# Patient Record
Sex: Female | Born: 1935
Health system: Southern US, Community
[De-identification: ages and names within clinical notes are randomized; demographics above are authoritative.]

## PROBLEM LIST (undated history)

## (undated) DIAGNOSIS — M1990 Unspecified osteoarthritis, unspecified site: Secondary | ICD-10-CM

## (undated) DIAGNOSIS — Z8049 Family history of malignant neoplasm of other genital organs: Secondary | ICD-10-CM

## (undated) DIAGNOSIS — M8580 Other specified disorders of bone density and structure, unspecified site: Secondary | ICD-10-CM

## (undated) DIAGNOSIS — Z801 Family history of malignant neoplasm of trachea, bronchus and lung: Secondary | ICD-10-CM

## (undated) DIAGNOSIS — E785 Hyperlipidemia, unspecified: Secondary | ICD-10-CM

## (undated) DIAGNOSIS — C8590 Non-Hodgkin lymphoma, unspecified, unspecified site: Secondary | ICD-10-CM

## (undated) DIAGNOSIS — C55 Malignant neoplasm of uterus, part unspecified: Secondary | ICD-10-CM

## (undated) DIAGNOSIS — G4700 Insomnia, unspecified: Secondary | ICD-10-CM

## (undated) DIAGNOSIS — Z9289 Personal history of other medical treatment: Secondary | ICD-10-CM

## (undated) DIAGNOSIS — Z8051 Family history of malignant neoplasm of kidney: Secondary | ICD-10-CM

## (undated) DIAGNOSIS — M719 Bursopathy, unspecified: Secondary | ICD-10-CM

## (undated) DIAGNOSIS — R339 Retention of urine, unspecified: Secondary | ICD-10-CM

## (undated) DIAGNOSIS — K219 Gastro-esophageal reflux disease without esophagitis: Secondary | ICD-10-CM

## (undated) DIAGNOSIS — Z8542 Personal history of malignant neoplasm of other parts of uterus: Secondary | ICD-10-CM

## (undated) DIAGNOSIS — I10 Essential (primary) hypertension: Secondary | ICD-10-CM

## (undated) DIAGNOSIS — F419 Anxiety disorder, unspecified: Secondary | ICD-10-CM

## (undated) DIAGNOSIS — T7840XA Allergy, unspecified, initial encounter: Secondary | ICD-10-CM

## (undated) HISTORY — DX: Non-Hodgkin lymphoma, unspecified, unspecified site: C85.90

## (undated) HISTORY — DX: Family history of malignant neoplasm of trachea, bronchus and lung: Z80.1

## (undated) HISTORY — PX: ABDOMINAL HYSTERECTOMY: SHX81

## (undated) HISTORY — DX: Other specified disorders of bone density and structure, unspecified site: M85.80

## (undated) HISTORY — PX: TONSILLECTOMY AND ADENOIDECTOMY: SUR1326

## (undated) HISTORY — DX: Gastro-esophageal reflux disease without esophagitis: K21.9

## (undated) HISTORY — DX: Retention of urine, unspecified: R33.9

## (undated) HISTORY — DX: Family history of malignant neoplasm of kidney: Z80.51

## (undated) HISTORY — DX: Anxiety disorder, unspecified: F41.9

## (undated) HISTORY — PX: LAPAROSCOPIC SMALL BOWEL RESECTION: SUR793

## (undated) HISTORY — DX: Unspecified osteoarthritis, unspecified site: M19.90

## (undated) HISTORY — DX: Insomnia, unspecified: G47.00

## (undated) HISTORY — PX: BLADDER SURGERY: SHX569

## (undated) HISTORY — PX: APPENDECTOMY: SHX54

## (undated) HISTORY — DX: Personal history of malignant neoplasm of other parts of uterus: Z85.42

## (undated) HISTORY — DX: Bursopathy, unspecified: M71.9

## (undated) HISTORY — DX: Allergy, unspecified, initial encounter: T78.40XA

## (undated) HISTORY — DX: Hyperlipidemia, unspecified: E78.5

## (undated) HISTORY — DX: Family history of malignant neoplasm of other genital organs: Z80.49

## (undated) HISTORY — PX: REPLACEMENT TOTAL KNEE BILATERAL: SUR1225

## (undated) HISTORY — DX: Personal history of other medical treatment: Z92.89

## (undated) HISTORY — DX: Essential (primary) hypertension: I10

## (undated) HISTORY — PX: OTHER SURGICAL HISTORY: SHX169

## (undated) HISTORY — PX: ECTOPIC PREGNANCY SURGERY: SHX613

## (undated) HISTORY — DX: Malignant neoplasm of uterus, part unspecified: C55

---

## 2008-09-07 DIAGNOSIS — H40059 Ocular hypertension, unspecified eye: Secondary | ICD-10-CM | POA: Insufficient documentation

## 2009-08-31 DIAGNOSIS — R339 Retention of urine, unspecified: Secondary | ICD-10-CM | POA: Insufficient documentation

## 2010-04-10 DIAGNOSIS — H04129 Dry eye syndrome of unspecified lacrimal gland: Secondary | ICD-10-CM | POA: Insufficient documentation

## 2011-02-12 DIAGNOSIS — Z9189 Other specified personal risk factors, not elsewhere classified: Secondary | ICD-10-CM | POA: Insufficient documentation

## 2011-02-13 DIAGNOSIS — M1710 Unilateral primary osteoarthritis, unspecified knee: Secondary | ICD-10-CM | POA: Insufficient documentation

## 2011-02-13 DIAGNOSIS — M179 Osteoarthritis of knee, unspecified: Secondary | ICD-10-CM | POA: Insufficient documentation

## 2016-03-17 DIAGNOSIS — H903 Sensorineural hearing loss, bilateral: Secondary | ICD-10-CM | POA: Diagnosis not present

## 2016-03-17 DIAGNOSIS — Z974 Presence of external hearing-aid: Secondary | ICD-10-CM | POA: Diagnosis not present

## 2016-03-17 DIAGNOSIS — H905 Unspecified sensorineural hearing loss: Secondary | ICD-10-CM | POA: Diagnosis not present

## 2016-04-01 DIAGNOSIS — N3944 Nocturnal enuresis: Secondary | ICD-10-CM | POA: Diagnosis not present

## 2016-04-02 DIAGNOSIS — H04123 Dry eye syndrome of bilateral lacrimal glands: Secondary | ICD-10-CM | POA: Diagnosis not present

## 2016-04-02 DIAGNOSIS — H40053 Ocular hypertension, bilateral: Secondary | ICD-10-CM | POA: Diagnosis not present

## 2016-04-02 DIAGNOSIS — Z961 Presence of intraocular lens: Secondary | ICD-10-CM | POA: Diagnosis not present

## 2016-04-02 DIAGNOSIS — H43393 Other vitreous opacities, bilateral: Secondary | ICD-10-CM | POA: Diagnosis not present

## 2016-04-17 DIAGNOSIS — R339 Retention of urine, unspecified: Secondary | ICD-10-CM | POA: Diagnosis not present

## 2016-04-17 DIAGNOSIS — R351 Nocturia: Secondary | ICD-10-CM | POA: Diagnosis not present

## 2016-04-21 DIAGNOSIS — K660 Peritoneal adhesions (postprocedural) (postinfection): Secondary | ICD-10-CM | POA: Diagnosis not present

## 2016-04-21 DIAGNOSIS — Z9889 Other specified postprocedural states: Secondary | ICD-10-CM | POA: Diagnosis not present

## 2016-04-21 DIAGNOSIS — Z87440 Personal history of urinary (tract) infections: Secondary | ICD-10-CM | POA: Diagnosis not present

## 2016-04-21 DIAGNOSIS — Z960 Presence of urogenital implants: Secondary | ICD-10-CM | POA: Diagnosis not present

## 2016-04-21 DIAGNOSIS — R8271 Bacteriuria: Secondary | ICD-10-CM | POA: Diagnosis not present

## 2016-04-21 DIAGNOSIS — N318 Other neuromuscular dysfunction of bladder: Secondary | ICD-10-CM | POA: Diagnosis not present

## 2016-04-21 DIAGNOSIS — Z792 Long term (current) use of antibiotics: Secondary | ICD-10-CM | POA: Diagnosis not present

## 2016-04-21 DIAGNOSIS — R339 Retention of urine, unspecified: Secondary | ICD-10-CM | POA: Diagnosis not present

## 2016-05-05 DIAGNOSIS — Z471 Aftercare following joint replacement surgery: Secondary | ICD-10-CM | POA: Diagnosis not present

## 2016-05-05 DIAGNOSIS — Z96653 Presence of artificial knee joint, bilateral: Secondary | ICD-10-CM | POA: Diagnosis not present

## 2016-05-05 DIAGNOSIS — Z96651 Presence of right artificial knee joint: Secondary | ICD-10-CM | POA: Diagnosis not present

## 2016-05-05 DIAGNOSIS — Z09 Encounter for follow-up examination after completed treatment for conditions other than malignant neoplasm: Secondary | ICD-10-CM | POA: Diagnosis not present

## 2016-05-05 DIAGNOSIS — Z96652 Presence of left artificial knee joint: Secondary | ICD-10-CM | POA: Diagnosis not present

## 2016-05-08 DIAGNOSIS — R55 Syncope and collapse: Secondary | ICD-10-CM | POA: Diagnosis not present

## 2016-05-08 DIAGNOSIS — R42 Dizziness and giddiness: Secondary | ICD-10-CM | POA: Diagnosis not present

## 2016-05-08 DIAGNOSIS — Z923 Personal history of irradiation: Secondary | ICD-10-CM | POA: Diagnosis not present

## 2016-05-08 DIAGNOSIS — D649 Anemia, unspecified: Secondary | ICD-10-CM | POA: Diagnosis not present

## 2016-05-08 DIAGNOSIS — R531 Weakness: Secondary | ICD-10-CM | POA: Diagnosis not present

## 2016-05-08 DIAGNOSIS — Z8544 Personal history of malignant neoplasm of other female genital organs: Secondary | ICD-10-CM | POA: Diagnosis not present

## 2016-05-08 DIAGNOSIS — D62 Acute posthemorrhagic anemia: Secondary | ICD-10-CM | POA: Diagnosis not present

## 2016-05-08 DIAGNOSIS — R58 Hemorrhage, not elsewhere classified: Secondary | ICD-10-CM | POA: Diagnosis not present

## 2016-05-09 DIAGNOSIS — R110 Nausea: Secondary | ICD-10-CM | POA: Diagnosis not present

## 2016-05-09 DIAGNOSIS — K259 Gastric ulcer, unspecified as acute or chronic, without hemorrhage or perforation: Secondary | ICD-10-CM | POA: Diagnosis present

## 2016-05-09 DIAGNOSIS — R531 Weakness: Secondary | ICD-10-CM | POA: Diagnosis not present

## 2016-05-09 DIAGNOSIS — Z96652 Presence of left artificial knee joint: Secondary | ICD-10-CM | POA: Diagnosis present

## 2016-05-09 DIAGNOSIS — A084 Viral intestinal infection, unspecified: Secondary | ICD-10-CM | POA: Diagnosis present

## 2016-05-09 DIAGNOSIS — R55 Syncope and collapse: Secondary | ICD-10-CM | POA: Diagnosis not present

## 2016-05-09 DIAGNOSIS — E7800 Pure hypercholesterolemia, unspecified: Secondary | ICD-10-CM | POA: Diagnosis present

## 2016-05-09 DIAGNOSIS — K920 Hematemesis: Secondary | ICD-10-CM | POA: Diagnosis not present

## 2016-05-09 DIAGNOSIS — Z7982 Long term (current) use of aspirin: Secondary | ICD-10-CM | POA: Diagnosis not present

## 2016-05-09 DIAGNOSIS — F419 Anxiety disorder, unspecified: Secondary | ICD-10-CM | POA: Diagnosis present

## 2016-05-09 DIAGNOSIS — C8309 Small cell B-cell lymphoma, extranodal and solid organ sites: Secondary | ICD-10-CM | POA: Diagnosis not present

## 2016-05-09 DIAGNOSIS — I951 Orthostatic hypotension: Secondary | ICD-10-CM | POA: Diagnosis present

## 2016-05-09 DIAGNOSIS — K219 Gastro-esophageal reflux disease without esophagitis: Secondary | ICD-10-CM | POA: Diagnosis present

## 2016-05-09 DIAGNOSIS — D62 Acute posthemorrhagic anemia: Secondary | ICD-10-CM | POA: Diagnosis not present

## 2016-05-09 DIAGNOSIS — I10 Essential (primary) hypertension: Secondary | ICD-10-CM | POA: Diagnosis present

## 2016-05-09 DIAGNOSIS — M1990 Unspecified osteoarthritis, unspecified site: Secondary | ICD-10-CM | POA: Diagnosis present

## 2016-05-09 DIAGNOSIS — R1110 Vomiting, unspecified: Secondary | ICD-10-CM | POA: Diagnosis not present

## 2016-05-09 DIAGNOSIS — N319 Neuromuscular dysfunction of bladder, unspecified: Secondary | ICD-10-CM | POA: Diagnosis not present

## 2016-05-09 DIAGNOSIS — R197 Diarrhea, unspecified: Secondary | ICD-10-CM | POA: Diagnosis not present

## 2016-05-09 DIAGNOSIS — R339 Retention of urine, unspecified: Secondary | ICD-10-CM | POA: Diagnosis present

## 2016-05-09 DIAGNOSIS — Z87440 Personal history of urinary (tract) infections: Secondary | ICD-10-CM | POA: Diagnosis not present

## 2016-05-09 DIAGNOSIS — K226 Gastro-esophageal laceration-hemorrhage syndrome: Secondary | ICD-10-CM | POA: Diagnosis present

## 2016-05-09 DIAGNOSIS — Z791 Long term (current) use of non-steroidal anti-inflammatories (NSAID): Secondary | ICD-10-CM | POA: Diagnosis not present

## 2016-05-09 DIAGNOSIS — Z8544 Personal history of malignant neoplasm of other female genital organs: Secondary | ICD-10-CM | POA: Diagnosis not present

## 2016-05-09 DIAGNOSIS — C884 Extranodal marginal zone B-cell lymphoma of mucosa-associated lymphoid tissue [MALT-lymphoma]: Secondary | ICD-10-CM | POA: Diagnosis not present

## 2016-05-09 DIAGNOSIS — N390 Urinary tract infection, site not specified: Secondary | ICD-10-CM | POA: Diagnosis present

## 2016-05-09 DIAGNOSIS — Z792 Long term (current) use of antibiotics: Secondary | ICD-10-CM | POA: Diagnosis not present

## 2016-05-09 DIAGNOSIS — R42 Dizziness and giddiness: Secondary | ICD-10-CM | POA: Diagnosis not present

## 2016-05-09 DIAGNOSIS — D649 Anemia, unspecified: Secondary | ICD-10-CM | POA: Diagnosis not present

## 2016-05-09 DIAGNOSIS — Z923 Personal history of irradiation: Secondary | ICD-10-CM | POA: Diagnosis not present

## 2016-05-09 DIAGNOSIS — M8580 Other specified disorders of bone density and structure, unspecified site: Secondary | ICD-10-CM | POA: Diagnosis present

## 2016-05-09 DIAGNOSIS — E861 Hypovolemia: Secondary | ICD-10-CM | POA: Diagnosis present

## 2016-05-09 DIAGNOSIS — Z9049 Acquired absence of other specified parts of digestive tract: Secondary | ICD-10-CM | POA: Diagnosis not present

## 2016-05-09 DIAGNOSIS — G479 Sleep disorder, unspecified: Secondary | ICD-10-CM | POA: Diagnosis present

## 2016-05-09 DIAGNOSIS — J302 Other seasonal allergic rhinitis: Secondary | ICD-10-CM | POA: Diagnosis present

## 2016-05-14 DIAGNOSIS — K259 Gastric ulcer, unspecified as acute or chronic, without hemorrhage or perforation: Secondary | ICD-10-CM | POA: Diagnosis not present

## 2016-05-14 DIAGNOSIS — C8309 Small cell B-cell lymphoma, extranodal and solid organ sites: Secondary | ICD-10-CM | POA: Diagnosis not present

## 2016-05-15 DIAGNOSIS — K5900 Constipation, unspecified: Secondary | ICD-10-CM | POA: Diagnosis not present

## 2016-05-15 DIAGNOSIS — K226 Gastro-esophageal laceration-hemorrhage syndrome: Secondary | ICD-10-CM | POA: Diagnosis not present

## 2016-05-15 DIAGNOSIS — K259 Gastric ulcer, unspecified as acute or chronic, without hemorrhage or perforation: Secondary | ICD-10-CM | POA: Diagnosis not present

## 2016-05-15 DIAGNOSIS — Z7982 Long term (current) use of aspirin: Secondary | ICD-10-CM | POA: Diagnosis not present

## 2016-05-20 DIAGNOSIS — K259 Gastric ulcer, unspecified as acute or chronic, without hemorrhage or perforation: Secondary | ICD-10-CM | POA: Diagnosis not present

## 2016-05-20 DIAGNOSIS — K226 Gastro-esophageal laceration-hemorrhage syndrome: Secondary | ICD-10-CM | POA: Diagnosis not present

## 2016-05-21 DIAGNOSIS — N3281 Overactive bladder: Secondary | ICD-10-CM | POA: Diagnosis not present

## 2016-05-21 DIAGNOSIS — Z923 Personal history of irradiation: Secondary | ICD-10-CM | POA: Diagnosis not present

## 2016-05-21 DIAGNOSIS — N3289 Other specified disorders of bladder: Secondary | ICD-10-CM | POA: Diagnosis not present

## 2016-05-21 DIAGNOSIS — R319 Hematuria, unspecified: Secondary | ICD-10-CM | POA: Diagnosis not present

## 2016-05-21 DIAGNOSIS — Z87440 Personal history of urinary (tract) infections: Secondary | ICD-10-CM | POA: Diagnosis not present

## 2016-05-21 DIAGNOSIS — R339 Retention of urine, unspecified: Secondary | ICD-10-CM | POA: Diagnosis not present

## 2016-05-21 DIAGNOSIS — Z960 Presence of urogenital implants: Secondary | ICD-10-CM | POA: Diagnosis not present

## 2016-05-26 DIAGNOSIS — R29898 Other symptoms and signs involving the musculoskeletal system: Secondary | ICD-10-CM | POA: Diagnosis not present

## 2016-05-26 DIAGNOSIS — Z5189 Encounter for other specified aftercare: Secondary | ICD-10-CM | POA: Diagnosis not present

## 2016-05-26 DIAGNOSIS — Z9181 History of falling: Secondary | ICD-10-CM | POA: Diagnosis not present

## 2016-05-26 DIAGNOSIS — R269 Unspecified abnormalities of gait and mobility: Secondary | ICD-10-CM | POA: Diagnosis not present

## 2016-05-26 DIAGNOSIS — Z96653 Presence of artificial knee joint, bilateral: Secondary | ICD-10-CM | POA: Diagnosis not present

## 2016-05-29 DIAGNOSIS — F419 Anxiety disorder, unspecified: Secondary | ICD-10-CM | POA: Diagnosis not present

## 2016-05-29 DIAGNOSIS — Z79899 Other long term (current) drug therapy: Secondary | ICD-10-CM | POA: Diagnosis not present

## 2016-05-29 DIAGNOSIS — K253 Acute gastric ulcer without hemorrhage or perforation: Secondary | ICD-10-CM | POA: Diagnosis not present

## 2016-05-29 DIAGNOSIS — C884 Extranodal marginal zone B-cell lymphoma of mucosa-associated lymphoid tissue [MALT-lymphoma]: Secondary | ICD-10-CM | POA: Diagnosis not present

## 2016-06-02 DIAGNOSIS — R269 Unspecified abnormalities of gait and mobility: Secondary | ICD-10-CM | POA: Diagnosis not present

## 2016-06-02 DIAGNOSIS — R29898 Other symptoms and signs involving the musculoskeletal system: Secondary | ICD-10-CM | POA: Diagnosis not present

## 2016-06-02 DIAGNOSIS — Z5189 Encounter for other specified aftercare: Secondary | ICD-10-CM | POA: Diagnosis not present

## 2016-06-02 DIAGNOSIS — Z96653 Presence of artificial knee joint, bilateral: Secondary | ICD-10-CM | POA: Diagnosis not present

## 2016-06-02 DIAGNOSIS — Z9181 History of falling: Secondary | ICD-10-CM | POA: Diagnosis not present

## 2016-06-05 DIAGNOSIS — Z923 Personal history of irradiation: Secondary | ICD-10-CM | POA: Diagnosis not present

## 2016-06-05 DIAGNOSIS — Z9889 Other specified postprocedural states: Secondary | ICD-10-CM | POA: Diagnosis not present

## 2016-06-05 DIAGNOSIS — I10 Essential (primary) hypertension: Secondary | ICD-10-CM | POA: Diagnosis not present

## 2016-06-05 DIAGNOSIS — C884 Extranodal marginal zone B-cell lymphoma of mucosa-associated lymphoid tissue [MALT-lymphoma]: Secondary | ICD-10-CM | POA: Diagnosis not present

## 2016-06-05 DIAGNOSIS — Z8544 Personal history of malignant neoplasm of other female genital organs: Secondary | ICD-10-CM | POA: Diagnosis not present

## 2016-06-05 DIAGNOSIS — C8309 Small cell B-cell lymphoma, extranodal and solid organ sites: Secondary | ICD-10-CM | POA: Diagnosis not present

## 2016-06-12 DIAGNOSIS — C884 Extranodal marginal zone B-cell lymphoma of mucosa-associated lymphoid tissue [MALT-lymphoma]: Secondary | ICD-10-CM | POA: Diagnosis not present

## 2016-06-13 DIAGNOSIS — C8309 Small cell B-cell lymphoma, extranodal and solid organ sites: Secondary | ICD-10-CM | POA: Diagnosis not present

## 2016-06-13 DIAGNOSIS — C8300 Small cell B-cell lymphoma, unspecified site: Secondary | ICD-10-CM | POA: Diagnosis not present

## 2016-06-13 DIAGNOSIS — Z114 Encounter for screening for human immunodeficiency virus [HIV]: Secondary | ICD-10-CM | POA: Diagnosis not present

## 2016-06-18 DIAGNOSIS — C8309 Small cell B-cell lymphoma, extranodal and solid organ sites: Secondary | ICD-10-CM | POA: Diagnosis not present

## 2016-08-19 DIAGNOSIS — C541 Malignant neoplasm of endometrium: Secondary | ICD-10-CM | POA: Diagnosis not present

## 2016-08-19 DIAGNOSIS — Z8544 Personal history of malignant neoplasm of other female genital organs: Secondary | ICD-10-CM | POA: Diagnosis not present

## 2016-08-19 DIAGNOSIS — Z124 Encounter for screening for malignant neoplasm of cervix: Secondary | ICD-10-CM | POA: Diagnosis not present

## 2016-08-19 DIAGNOSIS — N9989 Other postprocedural complications and disorders of genitourinary system: Secondary | ICD-10-CM | POA: Diagnosis not present

## 2016-08-19 DIAGNOSIS — I10 Essential (primary) hypertension: Secondary | ICD-10-CM | POA: Diagnosis not present

## 2016-08-19 DIAGNOSIS — E7800 Pure hypercholesterolemia, unspecified: Secondary | ICD-10-CM | POA: Diagnosis not present

## 2016-08-19 DIAGNOSIS — Z872 Personal history of diseases of the skin and subcutaneous tissue: Secondary | ICD-10-CM | POA: Diagnosis not present

## 2016-08-19 DIAGNOSIS — Z9189 Other specified personal risk factors, not elsewhere classified: Secondary | ICD-10-CM | POA: Diagnosis not present

## 2016-08-19 DIAGNOSIS — M8580 Other specified disorders of bone density and structure, unspecified site: Secondary | ICD-10-CM | POA: Diagnosis not present

## 2016-08-19 DIAGNOSIS — R87610 Atypical squamous cells of undetermined significance on cytologic smear of cervix (ASC-US): Secondary | ICD-10-CM | POA: Diagnosis not present

## 2016-08-19 DIAGNOSIS — R339 Retention of urine, unspecified: Secondary | ICD-10-CM | POA: Diagnosis not present

## 2016-08-19 DIAGNOSIS — S3141XA Laceration without foreign body of vagina and vulva, initial encounter: Secondary | ICD-10-CM | POA: Diagnosis not present

## 2016-08-19 DIAGNOSIS — K254 Chronic or unspecified gastric ulcer with hemorrhage: Secondary | ICD-10-CM | POA: Diagnosis not present

## 2016-08-19 DIAGNOSIS — R338 Other retention of urine: Secondary | ICD-10-CM | POA: Diagnosis not present

## 2016-08-19 DIAGNOSIS — R490 Dysphonia: Secondary | ICD-10-CM | POA: Diagnosis not present

## 2016-08-19 DIAGNOSIS — B009 Herpesviral infection, unspecified: Secondary | ICD-10-CM | POA: Diagnosis not present

## 2016-08-19 DIAGNOSIS — Z0000 Encounter for general adult medical examination without abnormal findings: Secondary | ICD-10-CM | POA: Diagnosis not present

## 2016-08-19 DIAGNOSIS — Z923 Personal history of irradiation: Secondary | ICD-10-CM | POA: Diagnosis not present

## 2016-08-19 DIAGNOSIS — Z1151 Encounter for screening for human papillomavirus (HPV): Secondary | ICD-10-CM | POA: Diagnosis not present

## 2016-08-19 DIAGNOSIS — S3095XA Unspecified superficial injury of vagina and vulva, initial encounter: Secondary | ICD-10-CM | POA: Diagnosis not present

## 2016-08-19 DIAGNOSIS — R87619 Unspecified abnormal cytological findings in specimens from cervix uteri: Secondary | ICD-10-CM | POA: Diagnosis not present

## 2016-08-19 DIAGNOSIS — Z23 Encounter for immunization: Secondary | ICD-10-CM | POA: Diagnosis not present

## 2016-08-19 DIAGNOSIS — R5382 Chronic fatigue, unspecified: Secondary | ICD-10-CM | POA: Diagnosis not present

## 2016-08-19 DIAGNOSIS — Z8719 Personal history of other diseases of the digestive system: Secondary | ICD-10-CM | POA: Diagnosis not present

## 2016-08-19 DIAGNOSIS — C884 Extranodal marginal zone B-cell lymphoma of mucosa-associated lymphoid tissue [MALT-lymphoma]: Secondary | ICD-10-CM | POA: Diagnosis not present

## 2016-08-19 DIAGNOSIS — Z79899 Other long term (current) drug therapy: Secondary | ICD-10-CM | POA: Diagnosis not present

## 2016-08-19 DIAGNOSIS — Z01419 Encounter for gynecological examination (general) (routine) without abnormal findings: Secondary | ICD-10-CM | POA: Diagnosis not present

## 2016-08-19 DIAGNOSIS — Z96653 Presence of artificial knee joint, bilateral: Secondary | ICD-10-CM | POA: Diagnosis not present

## 2016-08-21 DIAGNOSIS — R5382 Chronic fatigue, unspecified: Secondary | ICD-10-CM | POA: Diagnosis not present

## 2016-08-21 DIAGNOSIS — E7800 Pure hypercholesterolemia, unspecified: Secondary | ICD-10-CM | POA: Diagnosis not present

## 2016-08-21 DIAGNOSIS — I10 Essential (primary) hypertension: Secondary | ICD-10-CM | POA: Diagnosis not present

## 2016-09-18 DIAGNOSIS — K208 Other esophagitis: Secondary | ICD-10-CM | POA: Diagnosis not present

## 2016-09-18 DIAGNOSIS — K2950 Unspecified chronic gastritis without bleeding: Secondary | ICD-10-CM | POA: Diagnosis not present

## 2016-09-18 DIAGNOSIS — K3189 Other diseases of stomach and duodenum: Secondary | ICD-10-CM | POA: Diagnosis not present

## 2016-09-18 DIAGNOSIS — Z8719 Personal history of other diseases of the digestive system: Secondary | ICD-10-CM | POA: Diagnosis not present

## 2016-09-18 DIAGNOSIS — C169 Malignant neoplasm of stomach, unspecified: Secondary | ICD-10-CM | POA: Diagnosis not present

## 2016-09-18 DIAGNOSIS — C884 Extranodal marginal zone B-cell lymphoma of mucosa-associated lymphoid tissue [MALT-lymphoma]: Secondary | ICD-10-CM | POA: Diagnosis not present

## 2016-09-18 DIAGNOSIS — K228 Other specified diseases of esophagus: Secondary | ICD-10-CM | POA: Diagnosis not present

## 2016-09-22 DIAGNOSIS — C884 Extranodal marginal zone B-cell lymphoma of mucosa-associated lymphoid tissue [MALT-lymphoma]: Secondary | ICD-10-CM | POA: Diagnosis not present

## 2016-09-22 DIAGNOSIS — Z923 Personal history of irradiation: Secondary | ICD-10-CM | POA: Diagnosis not present

## 2016-09-22 DIAGNOSIS — Z8544 Personal history of malignant neoplasm of other female genital organs: Secondary | ICD-10-CM | POA: Diagnosis not present

## 2016-09-22 DIAGNOSIS — I10 Essential (primary) hypertension: Secondary | ICD-10-CM | POA: Diagnosis not present

## 2016-09-23 DIAGNOSIS — H1045 Other chronic allergic conjunctivitis: Secondary | ICD-10-CM | POA: Diagnosis not present

## 2016-09-23 DIAGNOSIS — H40013 Open angle with borderline findings, low risk, bilateral: Secondary | ICD-10-CM | POA: Diagnosis not present

## 2016-09-23 DIAGNOSIS — H40053 Ocular hypertension, bilateral: Secondary | ICD-10-CM | POA: Diagnosis not present

## 2016-09-23 DIAGNOSIS — H04123 Dry eye syndrome of bilateral lacrimal glands: Secondary | ICD-10-CM | POA: Diagnosis not present

## 2016-09-23 DIAGNOSIS — Z961 Presence of intraocular lens: Secondary | ICD-10-CM | POA: Diagnosis not present

## 2016-09-23 DIAGNOSIS — H43393 Other vitreous opacities, bilateral: Secondary | ICD-10-CM | POA: Diagnosis not present

## 2016-09-25 DIAGNOSIS — R87620 Atypical squamous cells of undetermined significance on cytologic smear of vagina (ASC-US): Secondary | ICD-10-CM | POA: Diagnosis not present

## 2016-09-25 DIAGNOSIS — R87610 Atypical squamous cells of undetermined significance on cytologic smear of cervix (ASC-US): Secondary | ICD-10-CM | POA: Diagnosis not present

## 2016-09-25 DIAGNOSIS — Z8544 Personal history of malignant neoplasm of other female genital organs: Secondary | ICD-10-CM | POA: Diagnosis not present

## 2016-09-25 DIAGNOSIS — Z90722 Acquired absence of ovaries, bilateral: Secondary | ICD-10-CM | POA: Diagnosis not present

## 2016-09-25 DIAGNOSIS — Z90710 Acquired absence of both cervix and uterus: Secondary | ICD-10-CM | POA: Diagnosis not present

## 2016-10-07 DIAGNOSIS — C884 Extranodal marginal zone B-cell lymphoma of mucosa-associated lymphoid tissue [MALT-lymphoma]: Secondary | ICD-10-CM | POA: Diagnosis not present

## 2016-11-12 DIAGNOSIS — R339 Retention of urine, unspecified: Secondary | ICD-10-CM | POA: Diagnosis not present

## 2016-11-26 DIAGNOSIS — R05 Cough: Secondary | ICD-10-CM | POA: Diagnosis not present

## 2016-12-26 DIAGNOSIS — E7800 Pure hypercholesterolemia, unspecified: Secondary | ICD-10-CM | POA: Diagnosis not present

## 2016-12-26 DIAGNOSIS — I10 Essential (primary) hypertension: Secondary | ICD-10-CM | POA: Diagnosis not present

## 2016-12-26 DIAGNOSIS — K3189 Other diseases of stomach and duodenum: Secondary | ICD-10-CM | POA: Diagnosis not present

## 2016-12-26 DIAGNOSIS — Z8619 Personal history of other infectious and parasitic diseases: Secondary | ICD-10-CM | POA: Diagnosis not present

## 2016-12-26 DIAGNOSIS — C884 Extranodal marginal zone B-cell lymphoma of mucosa-associated lymphoid tissue [MALT-lymphoma]: Secondary | ICD-10-CM | POA: Diagnosis not present

## 2016-12-26 DIAGNOSIS — K219 Gastro-esophageal reflux disease without esophagitis: Secondary | ICD-10-CM | POA: Diagnosis not present

## 2017-01-09 DIAGNOSIS — C884 Extranodal marginal zone B-cell lymphoma of mucosa-associated lymphoid tissue [MALT-lymphoma]: Secondary | ICD-10-CM | POA: Diagnosis not present

## 2017-01-12 DIAGNOSIS — I10 Essential (primary) hypertension: Secondary | ICD-10-CM | POA: Diagnosis not present

## 2017-01-12 DIAGNOSIS — F419 Anxiety disorder, unspecified: Secondary | ICD-10-CM | POA: Diagnosis not present

## 2017-01-12 DIAGNOSIS — M5431 Sciatica, right side: Secondary | ICD-10-CM | POA: Diagnosis not present

## 2017-02-17 DIAGNOSIS — C8590 Non-Hodgkin lymphoma, unspecified, unspecified site: Secondary | ICD-10-CM | POA: Diagnosis not present

## 2017-02-17 DIAGNOSIS — F419 Anxiety disorder, unspecified: Secondary | ICD-10-CM | POA: Diagnosis not present

## 2017-02-17 DIAGNOSIS — I10 Essential (primary) hypertension: Secondary | ICD-10-CM | POA: Diagnosis not present

## 2017-02-17 DIAGNOSIS — L659 Nonscarring hair loss, unspecified: Secondary | ICD-10-CM | POA: Diagnosis not present

## 2017-03-19 DIAGNOSIS — C884 Extranodal marginal zone B-cell lymphoma of mucosa-associated lymphoid tissue [MALT-lymphoma]: Secondary | ICD-10-CM | POA: Diagnosis not present

## 2017-03-24 DIAGNOSIS — H1045 Other chronic allergic conjunctivitis: Secondary | ICD-10-CM | POA: Diagnosis not present

## 2017-03-24 DIAGNOSIS — H04123 Dry eye syndrome of bilateral lacrimal glands: Secondary | ICD-10-CM | POA: Diagnosis not present

## 2017-03-24 DIAGNOSIS — H43393 Other vitreous opacities, bilateral: Secondary | ICD-10-CM | POA: Diagnosis not present

## 2017-03-24 DIAGNOSIS — H40053 Ocular hypertension, bilateral: Secondary | ICD-10-CM | POA: Diagnosis not present

## 2017-03-24 DIAGNOSIS — Z961 Presence of intraocular lens: Secondary | ICD-10-CM | POA: Diagnosis not present

## 2017-03-26 DIAGNOSIS — C884 Extranodal marginal zone B-cell lymphoma of mucosa-associated lymphoid tissue [MALT-lymphoma]: Secondary | ICD-10-CM | POA: Diagnosis not present

## 2017-04-01 DIAGNOSIS — Z0182 Encounter for allergy testing: Secondary | ICD-10-CM | POA: Diagnosis not present

## 2017-04-01 DIAGNOSIS — Z880 Allergy status to penicillin: Secondary | ICD-10-CM | POA: Diagnosis not present

## 2017-04-01 DIAGNOSIS — R21 Rash and other nonspecific skin eruption: Secondary | ICD-10-CM | POA: Diagnosis not present

## 2017-04-21 DIAGNOSIS — C884 Extranodal marginal zone B-cell lymphoma of mucosa-associated lymphoid tissue [MALT-lymphoma]: Secondary | ICD-10-CM | POA: Diagnosis not present

## 2017-04-22 DIAGNOSIS — R339 Retention of urine, unspecified: Secondary | ICD-10-CM | POA: Diagnosis not present

## 2017-04-30 DIAGNOSIS — K2950 Unspecified chronic gastritis without bleeding: Secondary | ICD-10-CM | POA: Diagnosis not present

## 2017-04-30 DIAGNOSIS — K3189 Other diseases of stomach and duodenum: Secondary | ICD-10-CM | POA: Diagnosis not present

## 2017-04-30 DIAGNOSIS — Z8719 Personal history of other diseases of the digestive system: Secondary | ICD-10-CM | POA: Diagnosis not present

## 2017-04-30 DIAGNOSIS — Z8572 Personal history of non-Hodgkin lymphomas: Secondary | ICD-10-CM | POA: Diagnosis not present

## 2017-04-30 DIAGNOSIS — K2210 Ulcer of esophagus without bleeding: Secondary | ICD-10-CM | POA: Diagnosis not present

## 2017-08-20 DIAGNOSIS — H9193 Unspecified hearing loss, bilateral: Secondary | ICD-10-CM | POA: Diagnosis not present

## 2017-08-20 DIAGNOSIS — R55 Syncope and collapse: Secondary | ICD-10-CM | POA: Diagnosis not present

## 2017-08-20 DIAGNOSIS — Z8579 Personal history of other malignant neoplasms of lymphoid, hematopoietic and related tissues: Secondary | ICD-10-CM | POA: Diagnosis not present

## 2017-08-20 DIAGNOSIS — Z8544 Personal history of malignant neoplasm of other female genital organs: Secondary | ICD-10-CM | POA: Diagnosis not present

## 2017-08-20 DIAGNOSIS — Z780 Asymptomatic menopausal state: Secondary | ICD-10-CM | POA: Diagnosis not present

## 2017-08-20 DIAGNOSIS — Z0000 Encounter for general adult medical examination without abnormal findings: Secondary | ICD-10-CM | POA: Diagnosis not present

## 2017-08-20 DIAGNOSIS — C52 Malignant neoplasm of vagina: Secondary | ICD-10-CM | POA: Diagnosis not present

## 2017-08-20 DIAGNOSIS — M8580 Other specified disorders of bone density and structure, unspecified site: Secondary | ICD-10-CM | POA: Diagnosis not present

## 2017-08-20 DIAGNOSIS — Z96653 Presence of artificial knee joint, bilateral: Secondary | ICD-10-CM | POA: Diagnosis not present

## 2017-08-20 DIAGNOSIS — F419 Anxiety disorder, unspecified: Secondary | ICD-10-CM | POA: Diagnosis not present

## 2017-08-20 DIAGNOSIS — C884 Extranodal marginal zone B-cell lymphoma of mucosa-associated lymphoid tissue [MALT-lymphoma]: Secondary | ICD-10-CM | POA: Diagnosis not present

## 2017-08-20 DIAGNOSIS — Z90710 Acquired absence of both cervix and uterus: Secondary | ICD-10-CM | POA: Diagnosis not present

## 2017-08-20 DIAGNOSIS — Z8719 Personal history of other diseases of the digestive system: Secondary | ICD-10-CM | POA: Diagnosis not present

## 2017-08-20 DIAGNOSIS — R87629 Unspecified abnormal cytological findings in specimens from vagina: Secondary | ICD-10-CM | POA: Diagnosis not present

## 2017-08-20 DIAGNOSIS — Z923 Personal history of irradiation: Secondary | ICD-10-CM | POA: Diagnosis not present

## 2017-08-20 DIAGNOSIS — I10 Essential (primary) hypertension: Secondary | ICD-10-CM | POA: Diagnosis not present

## 2017-08-20 DIAGNOSIS — L812 Freckles: Secondary | ICD-10-CM | POA: Diagnosis not present

## 2017-08-20 DIAGNOSIS — Z23 Encounter for immunization: Secondary | ICD-10-CM | POA: Diagnosis not present

## 2017-08-20 DIAGNOSIS — Z8742 Personal history of other diseases of the female genital tract: Secondary | ICD-10-CM | POA: Diagnosis not present

## 2017-08-20 DIAGNOSIS — L918 Other hypertrophic disorders of the skin: Secondary | ICD-10-CM | POA: Diagnosis not present

## 2017-08-20 DIAGNOSIS — E7800 Pure hypercholesterolemia, unspecified: Secondary | ICD-10-CM | POA: Diagnosis not present

## 2017-08-20 DIAGNOSIS — Z8619 Personal history of other infectious and parasitic diseases: Secondary | ICD-10-CM | POA: Diagnosis not present

## 2017-08-21 DIAGNOSIS — C884 Extranodal marginal zone B-cell lymphoma of mucosa-associated lymphoid tissue [MALT-lymphoma]: Secondary | ICD-10-CM | POA: Diagnosis not present

## 2017-08-21 DIAGNOSIS — E7800 Pure hypercholesterolemia, unspecified: Secondary | ICD-10-CM | POA: Diagnosis not present

## 2017-09-23 DIAGNOSIS — G43109 Migraine with aura, not intractable, without status migrainosus: Secondary | ICD-10-CM | POA: Diagnosis not present

## 2017-09-23 DIAGNOSIS — H40053 Ocular hypertension, bilateral: Secondary | ICD-10-CM | POA: Diagnosis not present

## 2017-09-23 DIAGNOSIS — H1045 Other chronic allergic conjunctivitis: Secondary | ICD-10-CM | POA: Diagnosis not present

## 2017-09-23 DIAGNOSIS — H43399 Other vitreous opacities, unspecified eye: Secondary | ICD-10-CM | POA: Diagnosis not present

## 2017-09-23 DIAGNOSIS — H40019 Open angle with borderline findings, low risk, unspecified eye: Secondary | ICD-10-CM | POA: Diagnosis not present

## 2017-09-23 DIAGNOSIS — H04123 Dry eye syndrome of bilateral lacrimal glands: Secondary | ICD-10-CM | POA: Diagnosis not present

## 2017-09-23 DIAGNOSIS — Z961 Presence of intraocular lens: Secondary | ICD-10-CM | POA: Diagnosis not present

## 2017-10-12 DIAGNOSIS — Z780 Asymptomatic menopausal state: Secondary | ICD-10-CM | POA: Diagnosis not present

## 2017-10-12 DIAGNOSIS — M8580 Other specified disorders of bone density and structure, unspecified site: Secondary | ICD-10-CM | POA: Diagnosis not present

## 2017-10-12 DIAGNOSIS — Z13820 Encounter for screening for osteoporosis: Secondary | ICD-10-CM | POA: Diagnosis not present

## 2017-10-20 DIAGNOSIS — M479 Spondylosis, unspecified: Secondary | ICD-10-CM | POA: Diagnosis not present

## 2017-10-20 DIAGNOSIS — I10 Essential (primary) hypertension: Secondary | ICD-10-CM | POA: Diagnosis not present

## 2017-10-20 DIAGNOSIS — Z09 Encounter for follow-up examination after completed treatment for conditions other than malignant neoplasm: Secondary | ICD-10-CM | POA: Diagnosis not present

## 2017-10-20 DIAGNOSIS — M8580 Other specified disorders of bone density and structure, unspecified site: Secondary | ICD-10-CM | POA: Diagnosis not present

## 2017-10-20 DIAGNOSIS — C884 Extranodal marginal zone B-cell lymphoma of mucosa-associated lymphoid tissue [MALT-lymphoma]: Secondary | ICD-10-CM | POA: Diagnosis not present

## 2017-10-20 DIAGNOSIS — C52 Malignant neoplasm of vagina: Secondary | ICD-10-CM | POA: Diagnosis not present

## 2017-10-20 DIAGNOSIS — Z8544 Personal history of malignant neoplasm of other female genital organs: Secondary | ICD-10-CM | POA: Diagnosis not present

## 2017-10-22 DIAGNOSIS — Z8719 Personal history of other diseases of the digestive system: Secondary | ICD-10-CM | POA: Diagnosis not present

## 2017-10-22 DIAGNOSIS — K3189 Other diseases of stomach and duodenum: Secondary | ICD-10-CM | POA: Diagnosis not present

## 2017-10-22 DIAGNOSIS — Z8572 Personal history of non-Hodgkin lymphomas: Secondary | ICD-10-CM | POA: Diagnosis not present

## 2017-10-22 DIAGNOSIS — K30 Functional dyspepsia: Secondary | ICD-10-CM | POA: Diagnosis not present

## 2017-12-29 DIAGNOSIS — L858 Other specified epidermal thickening: Secondary | ICD-10-CM | POA: Diagnosis not present

## 2017-12-29 DIAGNOSIS — L814 Other melanin hyperpigmentation: Secondary | ICD-10-CM | POA: Diagnosis not present

## 2017-12-29 DIAGNOSIS — D1801 Hemangioma of skin and subcutaneous tissue: Secondary | ICD-10-CM | POA: Diagnosis not present

## 2018-01-06 ENCOUNTER — Encounter: Payer: Self-pay | Admitting: Internal Medicine

## 2018-01-15 ENCOUNTER — Ambulatory Visit (INDEPENDENT_AMBULATORY_CARE_PROVIDER_SITE_OTHER): Payer: Medicare Other | Admitting: Family Medicine

## 2018-01-15 ENCOUNTER — Encounter: Payer: Self-pay | Admitting: Gastroenterology

## 2018-01-15 ENCOUNTER — Encounter: Payer: Self-pay | Admitting: Family Medicine

## 2018-01-15 VITALS — BP 130/78 | HR 67 | Temp 98.4°F | Resp 16 | Ht 65.0 in

## 2018-01-15 DIAGNOSIS — M85851 Other specified disorders of bone density and structure, right thigh: Secondary | ICD-10-CM | POA: Insufficient documentation

## 2018-01-15 DIAGNOSIS — C52 Malignant neoplasm of vagina: Secondary | ICD-10-CM | POA: Diagnosis not present

## 2018-01-15 DIAGNOSIS — H903 Sensorineural hearing loss, bilateral: Secondary | ICD-10-CM

## 2018-01-15 DIAGNOSIS — G4700 Insomnia, unspecified: Secondary | ICD-10-CM

## 2018-01-15 DIAGNOSIS — C884 Extranodal marginal zone b-cell lymphoma of mucosa-associated lymphoid tissue (malt-lymphoma) not having achieved remission: Secondary | ICD-10-CM | POA: Insufficient documentation

## 2018-01-15 DIAGNOSIS — M85852 Other specified disorders of bone density and structure, left thigh: Secondary | ICD-10-CM | POA: Diagnosis not present

## 2018-01-15 DIAGNOSIS — I10 Essential (primary) hypertension: Secondary | ICD-10-CM | POA: Diagnosis not present

## 2018-01-15 DIAGNOSIS — E785 Hyperlipidemia, unspecified: Secondary | ICD-10-CM

## 2018-01-15 DIAGNOSIS — Z87440 Personal history of urinary (tract) infections: Secondary | ICD-10-CM

## 2018-01-15 MED ORDER — ESZOPICLONE 2 MG PO TABS: 2.0000 mg | ORAL_TABLET | Freq: Every evening | ORAL | 2 refills | Status: DC | PRN

## 2018-01-15 MED ORDER — LOSARTAN POTASSIUM 50 MG PO TABS: 50.0000 mg | ORAL_TABLET | Freq: Every day | ORAL | 2 refills | Status: DC

## 2018-01-15 NOTE — Progress Notes (Signed)
HPI:   [name] is a 84 y.o. female, who is here today to establish care.  Former PCP: Dr Linda [name]. She just moved from [place] on 12/23/17 [place] [employer]. She lives alone, her daughter and granddaughter live in town. Independent ADLs and IADLs.  Currently she is not driving, she left her car in  [place] and has not decided about getting a car.  Last preventive routine visit: 08/2017  Chronic medical problems: Hearing loss, insomnia, anxiety, HLD,HTN,osteopenia,diarreha/constipation (on OTC Imodium/Miralax prn),OA, lymphoma, and vaginal cancer among some.  HTN:  Dx about 4-5 years ago. She is currently on losartan 50 mg daily.  Denies headache, visual changes, chest pain, dyspnea, palpitation, claudication, focal weakness, or edema.  History of glaucoma, she is already has an appointment to establish with ophthalmologist.  Concerns today: She is requesting referral to [place], and urologist. She also needs refills of some of her meds.  Insomnia: Currently she is on Lunesta 2 mg at bedtime, she has been taking it for 4-5 years and denies side effects. She was on Ambien before, according to patient, he was changed to [place] because her age.  Medication helps with sleep, 8-9 hours.  She feels rested the next day.  MALT lymphoma, according to pt, found incidentally during EGD. She was evaluated by oncologist, at some point radiation was recommended but she refused. She underwent periodic EGD, every 3 months until lesion healed. According to patient, EGD was initially done because episode of hematemesis.  It was found that she had a "esophagial tear." She also completed treatment for H. pylori.  States that she was also evaluated by GI in [place] for a "second opinion." Last EGD 10/2017 negative, 1 year f/u recommended.  Last colonoscopy done in 2013, 10-year follow-up was recommended.  History of neurosensory hearing loss for years, she has  bilateral hearing aids.  She would like to continue following with ENT periodically to be sure her hearing is stable. She denies earache.  History of cancer of the vaginal vault, she underwent radiation.  Due to post radiation complications she underwent small bowel resection and bladder surgery.  She self cath. Takes Nitrofurantoin 50 mg daily for UTI prophylaxis.  Intermitent diarrhea.   Last vaginal smear on 08/25/17 was negative.    Review of Systems  Constitutional: Negative for activity change, appetite change, fatigue and fever.  HENT: Negative for mouth sores, nosebleeds and trouble swallowing.   Eyes: Negative for redness and visual disturbance.  Respiratory: Negative for cough, shortness of breath and wheezing.   Cardiovascular: Negative for chest pain, palpitations and leg swelling.  Gastrointestinal: Negative for abdominal pain, nausea and vomiting.       Negative for changes in bowel habits.  Endocrine: Negative for cold intolerance, heat intolerance, polydipsia, polyphagia and polyuria.  Genitourinary: Negative for decreased urine volume, dysuria and hematuria.  Musculoskeletal: Negative for gait problem and myalgias.  Skin: Negative for rash.  Neurological: Negative for syncope, weakness and headaches.  Psychiatric/Behavioral: Negative for confusion. The patient is nervous/anxious.       Current Outpatient Medications on File Prior to Visit  Medication Sig Dispense Refill  . acetaminophen (TYLENOL) 500 MG tablet Take 500 mg by mouth every 6 (six) hours as needed.    Marland Kitchen aspirin EC 81 MG tablet Take 81 mg by mouth daily.    . bimatoprost (LUMIGAN) 0.01 % SOLN Place 1 drop into both eyes at bedtime.    . brimonidine (ALPHAGAN) 0.15 %  ophthalmic solution Place 1 drop into both eyes 3 (three) times daily.    . Catheters MISC by Does not apply route.    . cetirizine (ZYRTEC) 10 MG tablet Take 10 mg by mouth daily.    . nitrofurantoin (MACRODANTIN) 50 MG capsule     .  Polyethyl Glycol-Propyl Glycol (SYSTANE OP) Apply to eye.    . polyethylene glycol (MIRALAX / GLYCOLAX) packet Take 17 g by mouth as needed.    . rosuvastatin (CRESTOR) 5 MG tablet     . SHINGRIX injection     . terbinafine (LAMISIL) 1 % cream Apply 1 application topically daily.     No current facility-administered medications on file prior to visit.      Past Medical History:  Diagnosis Date  . Allergy   . Arthritis   . Cancer [facility])    UTERINE CANCER  . History of blood transfusion   . Hyperlipidemia   . Hypertension   . Osteopenia    MILD  . Urine retention    No Known Allergies  Family History  Problem Relation Age of Onset  . Cancer Sister   . COPD Maternal Grandfather     Social History   Socioeconomic History  . Marital status: Single    Spouse name: None  . Number of children: None  . Years of education: None  . Highest education level: None  Social Needs  . Financial resource strain: None  . Food insecurity - worry: None  . Food insecurity - inability: None  . Transportation needs - medical: None  . Transportation needs - non-medical: None  Occupational History  . None  Tobacco Use  . Smoking status: Never Smoker  . Smokeless tobacco: Never Used  Substance and Sexual Activity  . Alcohol use: Yes  . Drug use: No  . Sexual activity: No  Other Topics Concern  . None  Social History Narrative  . None    Vitals:   01/15/18 1039  BP: 130/78  Pulse: 67  Resp: 16  Temp: 98.4 F (36.9 C)  SpO2: 95%    There is no height or weight on file to calculate BMI.   Physical Exam  Nursing note and vitals reviewed. Constitutional: She is oriented to person, place, and time. She appears well-developed. No distress.  HENT:  Head: Normocephalic and atraumatic.  Mouth/Throat: Oropharynx is clear and moist and mucous membranes are normal.  Eyes: Conjunctivae and EOM are normal. Pupils are equal, round, and reactive to light.  Cardiovascular: Normal  rate and regular rhythm.  No murmur heard. Pulses:      Dorsalis pedis pulses are 2+ on the right side, and 2+ on the left side.  Respiratory: Effort normal and breath sounds normal. No respiratory distress.  GI: Soft. She exhibits no mass. There is no hepatomegaly. There is no tenderness.  Musculoskeletal: She exhibits edema (Trace pitting LE edema,bilateral.). She exhibits no tenderness.  Lymphadenopathy:    She has no cervical adenopathy.  Neurological: She is alert and oriented to person, place, and time. She has normal strength. Coordination normal.  Mildly unstable gait, not assistance.  Skin: Skin is warm. No rash noted. No erythema.  Psychiatric: Her mood appears anxious.  Well groomed, good eye contact.      ASSESSMENT AND PLAN:   Linda [name] was seen today for establish care.  Diagnoses and all orders for this visit:  Insomnia, unspecified type  Problem is well controlled with current management. No changes on Lunesta  2 mg. Good sleep hygiene also recommended. We discussed side effects of this medication, which are higher at her age. Follow-up in 3-4 months.  -     eszopiclone (LUNESTA) 2 MG TABS tablet; Take 1 tablet (2 mg total) by mouth at bedtime as needed for sleep.  Hypertension, essential, benign  Adequately controlled. No changes in current management. DASH-Low-salt diet to continue. Eye exam recommended annually. No changes in current management.  -     losartan (COZAAR) 50 MG tablet; Take 1 tablet (50 mg total) by mouth daily.  History of recurrent UTIs  No changes in current management. Urology referral placed as requested.  -     Ambulatory referral to Urology  Sensorineural hearing loss (SNHL) of both ears  Stable for years. ENT referral placed as requested.  -     Ambulatory referral to ENT  MALT lymphoma ([place])  Last EGD reported in 10/2017, a year follow-up was recommended. Continue following with GI, referral placed.  -      Ambulatory referral to Gastroenterology  H/O Cancer of vaginal vault ([place])  Last vaginal smear 08/2018. Continue annual follow-up.  Osteopenia of both hips  Fall precautions discussed. Continue Ca++ with vitamin D supplementation. DEXA in 2020.  Hyperlipidemia, unspecified hyperlipidemia type  No changes in Crestor 5 mg daily. Continue low-fat diet. Follow-up in 08/2017.     Glennice Marcos G. Martinique, MD  [facility] [place] office.

## 2018-01-15 NOTE — Patient Instructions (Addendum)
A few things to remember from today's visit:   History of recurrent UTIs - Plan: Ambulatory referral to Urology  H/O: upper GI bleed - Plan: Ambulatory referral to Gastroenterology  Insomnia, unspecified type - Plan: eszopiclone (LUNESTA) 2 MG TABS tablet  Hypertension, essential, benign - Plan: losartan (COZAAR) 50 MG tablet  Sensorineural hearing loss (SNHL) of both ears - Plan: Ambulatory referral to ENT  No changes today.   Please be sure medication list is accurate. If a new problem present, please set up appointment sooner than planned today.

## 2018-01-16 ENCOUNTER — Encounter: Payer: Self-pay | Admitting: Family Medicine

## 2018-01-16 DIAGNOSIS — G4700 Insomnia, unspecified: Secondary | ICD-10-CM | POA: Insufficient documentation

## 2018-01-16 DIAGNOSIS — H903 Sensorineural hearing loss, bilateral: Secondary | ICD-10-CM | POA: Insufficient documentation

## 2018-01-16 DIAGNOSIS — I10 Essential (primary) hypertension: Secondary | ICD-10-CM | POA: Insufficient documentation

## 2018-01-25 ENCOUNTER — Telehealth: Payer: Self-pay | Admitting: Family Medicine

## 2018-01-25 NOTE — Telephone Encounter (Signed)
Copied from [place] [phone] [phone]. Topic: Inquiry >> Jan 25, 2018  4:01 PM Corie Chiquito, [place] wrote: Reason for CRM: Patient calling because her Eszopiclone 2mg  needs to have a prior authorization so she will be able to receive the medication from the mail order pharmacy. [facility] [phone] [place] [phone]. Stated that [place] as sent over this request before but she was told by them that they haven't heard anything back from the office yet. Patient would like a call back when this is taken care as well, so she can know when her medications will be coming in the mail. She can be reached at [phone]

## 2018-01-29 NOTE — Telephone Encounter (Signed)
Prior auth sent to [id number] and approved.  I called the pt and informed her of this.

## 2018-01-29 NOTE — Telephone Encounter (Signed)
Spoke with patient, she stated that [facility] and told her that they faxed over a PA for dr to sign. PA in progress for medication.

## 2018-01-29 NOTE — Telephone Encounter (Signed)
Other options if her insurance does not cover this medication: Doxepin 6 mg at bedtime OR Trazodone 50 mg 1/2-1 tab 30 min before bedtime.  Thanks, BJ

## 2018-02-09 DIAGNOSIS — N312 Flaccid neuropathic bladder, not elsewhere classified: Secondary | ICD-10-CM | POA: Diagnosis not present

## 2018-02-09 DIAGNOSIS — R338 Other retention of urine: Secondary | ICD-10-CM | POA: Diagnosis not present

## 2018-02-10 DIAGNOSIS — H903 Sensorineural hearing loss, bilateral: Secondary | ICD-10-CM | POA: Diagnosis not present

## 2018-02-10 DIAGNOSIS — J310 Chronic rhinitis: Secondary | ICD-10-CM | POA: Diagnosis not present

## 2018-02-10 DIAGNOSIS — J343 Hypertrophy of nasal turbinates: Secondary | ICD-10-CM | POA: Diagnosis not present

## 2018-02-10 DIAGNOSIS — R490 Dysphonia: Secondary | ICD-10-CM | POA: Diagnosis not present

## 2018-02-10 DIAGNOSIS — H838X3 Other specified diseases of inner ear, bilateral: Secondary | ICD-10-CM | POA: Diagnosis not present

## 2018-02-12 DIAGNOSIS — H401131 Primary open-angle glaucoma, bilateral, mild stage: Secondary | ICD-10-CM | POA: Diagnosis not present

## 2018-02-12 DIAGNOSIS — H524 Presbyopia: Secondary | ICD-10-CM | POA: Diagnosis not present

## 2018-02-23 ENCOUNTER — Encounter: Payer: Self-pay | Admitting: Gastroenterology

## 2018-02-23 ENCOUNTER — Ambulatory Visit (INDEPENDENT_AMBULATORY_CARE_PROVIDER_SITE_OTHER): Payer: Medicare Other | Admitting: Gastroenterology

## 2018-02-23 VITALS — BP 118/74 | HR 64 | Ht 64.0 in

## 2018-02-23 DIAGNOSIS — C884 Extranodal marginal zone B-cell lymphoma of mucosa-associated lymphoid tissue [MALT-lymphoma]: Secondary | ICD-10-CM | POA: Diagnosis not present

## 2018-02-23 NOTE — Patient Instructions (Addendum)
We will get records sent from your previous gastroenterologist(s), especially any EGD reports, (with associated pathology) in the past 2-3 years for review.    EGD in July 2019 for history of MALToma.  Normal BMI (Body Mass Index- based on height and weight) is between 23 and 30. Your BMI today is There is no height or weight on file to calculate BMI. Marland Kitchen Please consider follow up  regarding your BMI with your Primary Care Provider.

## 2018-02-23 NOTE — Progress Notes (Signed)
HPI: This is a pleasant 84 year old woman who was referred to me by Martinique, Betty G, MD  to evaluate history of gastric cancer, MALToma.    Chief complaint is history of MALToma  Bloody vomiting 2 years ago led to EGD and biopsies. Tells me she was told she had MALToma.  She is pretty clear in her history that she is not sure of that original diagnosis. She tells me that the path report from [facility]) has been lost and is not available. Underwent H. Pylori treatment.  Not sure that she had H pylori ever proven, however.  XRT remotely to gynecologic organs  Ended up getting a second opinion at [facility] recommended some bloodwork and and referral to oncologist.  He told her that she should have had a penicillin based antibiotic treatment.  He sent her to a hematologist and that hematologist offered what sounds like intravenous chemotherapy however she declined.  She ended up going back to [place] for repeat upper endoscopy.  Most recent EGD 10/2017 at [facility] again, lesion has healed and path report shows no evidence of MALToma anymore  Self caths for urinary diversion.  Old Data Reviewed (cut, pasted from her recent new PCP visit): (((MALT lymphoma, according to pt, found incidentally during EGD. She was evaluated by oncologist, at some point radiation was recommended but she refused. She underwent periodic EGD, every 3 months until lesion healed. According to patient, EGD was initially done because episode of hematemesis.  It was found that she had a "esophagial tear." She also completed treatment for H. pylori.  States that she was also evaluated by GI in [place] for a "second opinion." Last EGD 10/2017 negative, 1 year f/u recommended.  Last colonoscopy done in 2013, 10-year follow-up was recommended)))     Review of systems: Pertinent positive and negative review of systems were noted in the above HPI section. All other review negative.   Past  Medical History:  Diagnosis Date  . Allergy   . Anxiety   . Arthritis   . Cancer [facility])    UTERINE CANCER  . History of blood transfusion   . Hyperlipidemia   . Hypertension   . Osteopenia    MILD  . Urine retention   . Uterine cancer ([place])    at age 8    Past Surgical History:  Procedure Laterality Date  . ABDOMINAL HYSTERECTOMY     AGE 71  . APPENDECTOMY     AGE 32  . BLADDER SURGERY    . NM PET DX LYMPHOMA  10/2018   IN REMISSION  . OTHER SURGICAL HISTORY    . REPLACEMENT TOTAL KNEE BILATERAL    . TONSILLECTOMY AND ADENOIDECTOMY    . UTERINE CANCER SURGERY      Current Outpatient Medications  Medication Sig Dispense Refill  . acetaminophen (TYLENOL) 500 MG tablet Take 500 mg by mouth every 6 (six) hours as needed.    Marland Kitchen aspirin EC 81 MG tablet Take 81 mg by mouth daily.    . bimatoprost (LUMIGAN) 0.01 % SOLN Place 1 drop into both eyes at bedtime.    . brimonidine (ALPHAGAN) 0.15 % ophthalmic solution Place 1 drop into both eyes 3 (three) times daily.    . Catheters MISC by Does not apply route.    . cetirizine (ZYRTEC) 10 MG tablet Take 10 mg by mouth daily.    . eszopiclone (LUNESTA) 2 MG TABS tablet Take 1 tablet (2 mg total) by mouth  at bedtime as needed for sleep. 30 tablet 2  . losartan (COZAAR) 50 MG tablet Take 1 tablet (50 mg total) by mouth daily. 90 tablet 2  . nitrofurantoin (MACRODANTIN) 50 MG capsule     . Omeprazole (PRILOSEC PO) Take by mouth daily.    . Oxymetazoline HCl (NASAL SPRAY) 0.05 % SOLN Place into the nose daily.    Vladimir Faster Glycol-Propyl Glycol (SYSTANE OP) Apply to eye.    . polyethylene glycol (MIRALAX / GLYCOLAX) packet Take 17 g by mouth as needed.    . rosuvastatin (CRESTOR) 5 MG tablet     . terbinafine (LAMISIL) 1 % cream Apply 1 application topically daily.     No current facility-administered medications for this visit.     Allergies as of 02/23/2018  . (No Known Allergies)    Family History  Problem Relation Age of  Onset  . Uterine cancer Sister   . COPD Maternal Grandfather   . Uterine cancer Maternal Grandmother     Social History   Socioeconomic History  . Marital status: Single    Spouse name: Not on file  . Number of children: Not on file  . Years of education: Not on file  . Highest education level: Not on file  Social Needs  . Financial resource strain: Not on file  . Food insecurity - worry: Not on file  . Food insecurity - inability: Not on file  . Transportation needs - medical: Not on file  . Transportation needs - non-medical: Not on file  Occupational History  . Not on file  Tobacco Use  . Smoking status: Never Smoker  . Smokeless tobacco: Never Used  Substance and Sexual Activity  . Alcohol use: Yes  . Drug use: No  . Sexual activity: No  Other Topics Concern  . Not on file  Social History Narrative  . Not on file     Physical Exam: There were no vitals taken for this visit. Constitutional: generally well-appearing Psychiatric: alert and oriented x3 Eyes: extraocular movements intact Mouth: oral pharynx moist, no lesions Neck: supple no lymphadenopathy Cardiovascular: heart regular rate and rhythm Lungs: clear to auscultation bilaterally Abdomen: soft, nontender, nondistended, no obvious ascites, no peritoneal signs, normal bowel sounds Extremities: no lower extremity edema bilaterally Skin: no lesions on visible extremities   Assessment and plan: 84 y.o. female with history of MALToma  We will try  to get records from her however we will still give it a try.  [facility] EGD that initially establish the diagnosis of MALToma.  She tells me those records have all been lost she may indeed have had MALToma which was then treated successfully with H. pylori antibiotics.  She is very clear that she was never actually proven to have H. pylori.  What is most encouraging to me is that her most recent EGD, about 3 months ago by her original gastroenterologist,  showed the area in question her stomach had completely healed.  Biopsies showed no clear evidence of persistent MALToma.  I recommended repeat EGD a year from her last one however she wants it done sooner.  We will put her in our recall system for upper endoscopy July 2019.  She also tells me she has been getting colon cancer screening colonoscopy every 5 years since she was diagnosed and treated for a gynecologic malignancy.  Her last colonoscopy was in 2013.  She asked my opinion if she needed another one in 5-10 years and I said I  do not think she needs colon cancer screening any further, the soonest absolutely would be 10 years from her last colonoscopy.  At that point she would be 20 and colon cancer screening is no longer advised at that age.  She does understand that that does not mean we would ignore any symptoms if they came up, that is quite a bit different than screening for cancer however.    Please see the "Patient Instructions" section for addition details about the plan.   Owens Loffler, MD [facility] 02/23/2018, 11:07 AM  Cc: Martinique, Betty G, MD

## 2018-02-24 ENCOUNTER — Ambulatory Visit (INDEPENDENT_AMBULATORY_CARE_PROVIDER_SITE_OTHER): Payer: Medicare Other | Admitting: Podiatry

## 2018-02-24 ENCOUNTER — Other Ambulatory Visit: Payer: Self-pay | Admitting: Podiatry

## 2018-02-24 ENCOUNTER — Encounter: Payer: Self-pay | Admitting: Podiatry

## 2018-02-24 ENCOUNTER — Ambulatory Visit (INDEPENDENT_AMBULATORY_CARE_PROVIDER_SITE_OTHER): Payer: Medicare Other

## 2018-02-24 DIAGNOSIS — M79671 Pain in right foot: Secondary | ICD-10-CM | POA: Diagnosis not present

## 2018-02-24 DIAGNOSIS — M779 Enthesopathy, unspecified: Secondary | ICD-10-CM

## 2018-02-24 DIAGNOSIS — M79672 Pain in left foot: Secondary | ICD-10-CM | POA: Diagnosis not present

## 2018-02-24 NOTE — Progress Notes (Signed)
   Subjective:    Patient ID: Linda [name], female    DOB: Jan 27, 1932, 84 y.o.   MRN: [id number]  HPI    Review of Systems  All other systems reviewed and are negative.      Objective:   Physical Exam        Assessment & Plan:

## 2018-02-25 NOTE — Progress Notes (Signed)
Subjective:   Patient ID: Linda [name], female   DOB: 84 y.o.   MRN: [id number]   HPI Patient presents stating that she needs new orthotics in her feet for the most part feels good but her orthotics are starting to wear out.  Patient does not smoke and likes to be active   Review of Systems  All other systems reviewed and are negative.       Objective:  Physical Exam  Constitutional: She appears well-developed and well-nourished.  Cardiovascular: Intact distal pulses.  Pulmonary/Chest: Effort normal.  Musculoskeletal: Normal range of motion.  Neurological: She is alert.  Skin: Skin is warm.  Nursing note and vitals reviewed.   Neurovascular status found to be intact muscle strength adequate range of motion within normal limits with patient noted to have moderate flatfoot deformity and inflammation tendinitis of the generalized nature of midfoot area bilateral.  Patient has good digital perfusion was found to be well oriented x3     Assessment:  Appears to be a tendinitis-like condition with no indications of advanced pathology or advanced arthritis     Plan:  H&P x-rays reviewed and at this time patient was scanned for new orthotics to provide for plantar support.  Patient will be seen back when those are ready and was given all instructions today  X-rays indicate that there is moderate depression of the arch with no indications of advanced arthritis or stress fracture

## 2018-03-18 ENCOUNTER — Other Ambulatory Visit: Payer: Federal, State, Local not specified - PPO | Admitting: Orthotics

## 2018-03-23 ENCOUNTER — Ambulatory Visit: Payer: Medicare Other | Admitting: Orthotics

## 2018-03-23 DIAGNOSIS — M79672 Pain in left foot: Principal | ICD-10-CM

## 2018-03-23 DIAGNOSIS — M779 Enthesopathy, unspecified: Secondary | ICD-10-CM

## 2018-03-23 DIAGNOSIS — M79671 Pain in right foot: Secondary | ICD-10-CM

## 2018-03-23 NOTE — Progress Notes (Signed)
Patient came in today to pick up custom made foot orthotics.  The goals were accomplished and the patient reported no dissatisfaction with said orthotics.  Patient was advised of breakin period and how to report any issues. 

## 2018-03-26 ENCOUNTER — Telehealth: Payer: Self-pay | Admitting: Gastroenterology

## 2018-03-26 DIAGNOSIS — H903 Sensorineural hearing loss, bilateral: Secondary | ICD-10-CM | POA: Diagnosis not present

## 2018-03-26 NOTE — Telephone Encounter (Signed)
Received notice from [place] from records request. [facility] - Dr Ardis Hughs - sent request to [facility] in [place] for 2016 Pathology Report/EGD.  No records found for this date per letter from [facility].  03/26/18 LM

## 2018-04-07 DIAGNOSIS — Z7289 Other problems related to lifestyle: Secondary | ICD-10-CM | POA: Diagnosis not present

## 2018-04-07 DIAGNOSIS — R490 Dysphonia: Secondary | ICD-10-CM | POA: Diagnosis not present

## 2018-04-08 DIAGNOSIS — R490 Dysphonia: Secondary | ICD-10-CM | POA: Insufficient documentation

## 2018-05-14 ENCOUNTER — Ambulatory Visit (INDEPENDENT_AMBULATORY_CARE_PROVIDER_SITE_OTHER): Payer: Medicare Other | Admitting: Family Medicine

## 2018-05-14 ENCOUNTER — Encounter: Payer: Self-pay | Admitting: Family Medicine

## 2018-05-14 VITALS — BP 130/76 | HR 60 | Temp 98.0°F | Resp 12 | Ht 64.0 in

## 2018-05-14 DIAGNOSIS — I499 Cardiac arrhythmia, unspecified: Secondary | ICD-10-CM | POA: Diagnosis not present

## 2018-05-14 DIAGNOSIS — E785 Hyperlipidemia, unspecified: Secondary | ICD-10-CM | POA: Diagnosis not present

## 2018-05-14 DIAGNOSIS — I10 Essential (primary) hypertension: Secondary | ICD-10-CM

## 2018-05-14 DIAGNOSIS — G4700 Insomnia, unspecified: Secondary | ICD-10-CM

## 2018-05-14 LAB — BASIC METABOLIC PANEL
BUN: 21 mg/dL (ref 6–23)
CO2: 29 meq/L (ref 19–32)
Calcium: 9.3 mg/dL (ref 8.4–10.5)
Chloride: 102 mEq/L (ref 96–112)
Creatinine, Ser: 0.73 mg/dL (ref 0.40–1.20)
GFR: 80.77 mL/min (ref >60.00)
GLUCOSE: 88 mg/dL (ref 70–99)
Potassium: 4.6 mEq/L (ref 3.5–5.1)
SODIUM: 139 meq/L (ref 135–145)

## 2018-05-14 NOTE — Patient Instructions (Addendum)
A few things to remember from today's visit:   Hypertension, essential, benign - Plan: Basic metabolic panel, EKG 03-[facility]  Insomnia, unspecified type  Hyperlipidemia, unspecified hyperlipidemia type  Irregular heart rate - Plan: EKG 12-[facility]  No changes today.  I will see you back in 08/2018 for your physical and fasting labs.   Please be sure medication list is accurate. If a new problem present, please set up appointment sooner than planned today.

## 2018-05-14 NOTE — Assessment & Plan Note (Signed)
Problem has been well controlled with Lunesta 2 mg at bedtime. Good sleep hygiene is also important. No changes in current management. Follow-up in 6 months.

## 2018-05-14 NOTE — Progress Notes (Signed)
HPI:   Linda [name] is a 84 y.o. female, who is here today for 5 months follow up.   She was last seen on 01/15/18.  Since her last OV she has seen Dr Edison Nasuti (GI) and podiatrist (Dr Paulla Dolly).  Hypertension:   5+ years hx. Currently on Losartan 50 mg daily.   Last eye exam: Dr Valetta Close, 01/2018, every 6 months.   She is taking medications as instructed, no side effects reported. Noted mildly irregular HR today.  She has not noted unusual headache, visual changes, exertional chest pain, dyspnea,  focal weakness, or edema.   Insomnia: She is on Lunesta 2 mg daily, which she has taken for over 4 years. She was on Ambien before.  Sleeping 7-8 hours. She feels rested and has not noted side effects.  Hyperlipidemia:  Currently on Crestor 5 mg daily. Following a low fat diet: Yes.  She has not noted side effects with medication.   She also followed with ENT, who increased dose of Omeprazole. Dysphonia seems to be related to GERD.     Review of Systems  Constitutional: Negative for activity change, appetite change, fatigue and fever.  HENT: Negative for mouth sores, nosebleeds and trouble swallowing.   Eyes: Negative for redness and visual disturbance.  Respiratory: Negative for cough, shortness of breath and wheezing.   Cardiovascular: Negative for chest pain, palpitations and leg swelling.  Gastrointestinal: Negative for abdominal pain, nausea and vomiting.       Negative for changes in bowel habits.  Endocrine: Negative for cold intolerance and heat intolerance.  Genitourinary: Negative for decreased urine volume and hematuria.  Neurological: Negative for syncope, weakness and headaches.  Psychiatric/Behavioral: Positive for sleep disturbance. Negative for confusion.    Current Outpatient Medications on File Prior to Visit  Medication Sig Dispense Refill  . acetaminophen (TYLENOL) 500 MG tablet Take 500 mg by mouth every 6 (six) hours as needed.    Marland Kitchen  aspirin EC 81 MG tablet Take 81 mg by mouth daily.    . bimatoprost (LUMIGAN) 0.01 % SOLN Place 1 drop into both eyes at bedtime.    . brimonidine (ALPHAGAN) 0.15 % ophthalmic solution Place 1 drop into both eyes 3 (three) times daily.    . Catheters MISC by Does not apply route.    . cetirizine (ZYRTEC) 10 MG tablet Take 10 mg by mouth daily.    . eszopiclone (LUNESTA) 2 MG TABS tablet Take 1 tablet (2 mg total) by mouth at bedtime as needed for sleep. 30 tablet 2  . fluticasone (FLONASE) 50 MCG/ACT nasal spray   99  . loperamide (IMODIUM) 2 MG capsule Take by mouth.    . losartan (COZAAR) 50 MG tablet Take 1 tablet (50 mg total) by mouth daily. 90 tablet 2  . nitrofurantoin (MACRODANTIN) 50 MG capsule     . omeprazole (PRILOSEC) 10 MG capsule Take 10 mg by mouth 2 (two) times daily. Take 2 capsules before breakfast and dinner    . Oxymetazoline HCl (NASAL SPRAY) 0.05 % SOLN Place into the nose daily.    . rosuvastatin (CRESTOR) 5 MG tablet      No current facility-administered medications on file prior to visit.      Past Medical History:  Diagnosis Date  . Allergy   . Anxiety   . Arthritis   . Cancer [facility])    UTERINE CANCER  . History of blood transfusion   . Hyperlipidemia   . Hypertension   .  Osteopenia    MILD  . Urine retention   . Uterine cancer ([place])    at age 69   No Known Allergies  Social History   Socioeconomic History  . Marital status: Single    Spouse name: Not on file  . Number of children: Not on file  . Years of education: Not on file  . Highest education level: Not on file  Occupational History  . Not on file  Social Needs  . Financial resource strain: Not on file  . Food insecurity:    Worry: Not on file    Inability: Not on file  . Transportation needs:    Medical: Not on file    Non-medical: Not on file  Tobacco Use  . Smoking status: Never Smoker  . Smokeless tobacco: Never Used  Substance and Sexual Activity  . Alcohol use: Yes  .  Drug use: No  . Sexual activity: Never  Lifestyle  . Physical activity:    Days per week: Not on file    Minutes per session: Not on file  . Stress: Not on file  Relationships  . Social connections:    Talks on phone: Not on file    Gets together: Not on file    Attends religious service: Not on file    Active member of club or organization: Not on file    Attends meetings of clubs or organizations: Not on file    Relationship status: Not on file  Other Topics Concern  . Not on file  Social History Narrative  . Not on file    Vitals:   05/14/18 1014  BP: 130/76  Pulse: 60  Resp: 12  Temp: 98 F (36.7 C)  SpO2: 96%   There is no height or weight on file to calculate BMI.   Physical Exam  Nursing note and vitals reviewed. Constitutional: She is oriented to person, place, and time. She appears well-developed. No distress.  HENT:  Head: Normocephalic and atraumatic.  Mouth/Throat: Oropharynx is clear and moist and mucous membranes are normal.  Eyes: Pupils are equal, round, and reactive to light. Conjunctivae are normal.  Cardiovascular: Normal rate. An irregular rhythm present.  No murmur heard. Pulses:      Dorsalis pedis pulses are 2+ on the right side, and 2+ on the left side.  Respiratory: Effort normal and breath sounds normal. No respiratory distress.  GI: Soft. She exhibits no mass. There is no hepatomegaly. There is no tenderness.  Musculoskeletal: She exhibits no edema.  Lymphadenopathy:    She has no cervical adenopathy.  Neurological: She is alert and oriented to person, place, and time. She has normal strength. Coordination normal.  Otherwise stable gait without assistance.  Skin: Skin is warm. No erythema.  Psychiatric: She has a normal mood and affect.  Well groomed, good eye contact.     ASSESSMENT AND PLAN:   Linda [name] was seen today for 5 months follow-up.  Orders Placed This Encounter  Procedures  . Basic metabolic panel  .  EKG 12-Lead   Lab Results  Component Value Date   CREATININE 0.73 05/14/2018   BUN 21 05/14/2018   NA 139 05/14/2018   K 4.6 05/14/2018   CL 102 05/14/2018   CO2 29 05/14/2018    Irregular heart rate  Asymptomatic. EKG today: SR, LAD,normal intervals,PAC's. No other EKG available for comparison. EKG findings discussed,will continue following.  Hyperlipidemia She prefers to wait until her CPE to the lipid  panel. No changes in Crestor 5 mg daily. Continue low-fat diet. Follow-up in 08/2018.  Insomnia Problem has been well controlled with Lunesta 2 mg at bedtime. Good sleep hygiene is also important. No changes in current management. Follow-up in 6 months.  Hypertension, essential, benign Adequately controlled. No changes in current management. Low-salt diet to continue. Eye exam is current. F/U in 6 months, before if needed.      Betty G. Martinique, MD  [facility]. [place] office.

## 2018-05-14 NOTE — Assessment & Plan Note (Signed)
Adequately controlled. No changes in current management. Low-salt diet to continue. Eye exam is current.  F/U in 6 months, before if needed.  

## 2018-05-14 NOTE — Assessment & Plan Note (Signed)
She prefers to wait until her CPE to the lipid panel. No changes in Crestor 5 mg daily. Continue low-fat diet. Follow-up in 08/2018.

## 2018-05-15 ENCOUNTER — Encounter: Payer: Self-pay | Admitting: Family Medicine

## 2018-05-15 MED ORDER — ESZOPICLONE 2 MG PO TABS: 2.0000 mg | ORAL_TABLET | Freq: Every evening | ORAL | 3 refills | Status: DC | PRN

## 2018-05-20 ENCOUNTER — Ambulatory Visit (AMBULATORY_SURGERY_CENTER): Payer: Self-pay

## 2018-05-20 ENCOUNTER — Other Ambulatory Visit: Payer: Self-pay

## 2018-05-20 VITALS — Ht 65.0 in | Wt 208.0 lb

## 2018-05-20 DIAGNOSIS — C884 Extranodal marginal zone B-cell lymphoma of mucosa-associated lymphoid tissue [MALT-lymphoma]: Secondary | ICD-10-CM

## 2018-05-20 NOTE — Progress Notes (Signed)
Denies allergies to eggs or soy products. Denies complication of anesthesia or sedation. Denies use of weight loss medication. Denies use of O2.   Emmi instructions declined.   Patient was requesting that she record all conversations with Dr. Ardis Hughs. I told the patient that it was against our policy and it would not be allowed. I explained that all information would be written in her procedure report. Patient was slightly difficult throughout the Pre-Visit appointment.

## 2018-05-24 ENCOUNTER — Encounter: Payer: Self-pay | Admitting: Gastroenterology

## 2018-05-24 ENCOUNTER — Ambulatory Visit (AMBULATORY_SURGERY_CENTER): Payer: Medicare Other | Admitting: Gastroenterology

## 2018-05-24 ENCOUNTER — Other Ambulatory Visit: Payer: Self-pay

## 2018-05-24 VITALS — BP 129/50 | HR 50 | Temp 98.6°F | Resp 17 | Ht 65.0 in | Wt 208.0 lb

## 2018-05-24 DIAGNOSIS — I10 Essential (primary) hypertension: Secondary | ICD-10-CM | POA: Diagnosis not present

## 2018-05-24 DIAGNOSIS — K3189 Other diseases of stomach and duodenum: Secondary | ICD-10-CM | POA: Diagnosis not present

## 2018-05-24 DIAGNOSIS — K2990 Gastroduodenitis, unspecified, without bleeding: Secondary | ICD-10-CM

## 2018-05-24 DIAGNOSIS — C884 Extranodal marginal zone B-cell lymphoma of mucosa-associated lymphoid tissue [MALT-lymphoma]: Secondary | ICD-10-CM

## 2018-05-24 DIAGNOSIS — Z09 Encounter for follow-up examination after completed treatment for conditions other than malignant neoplasm: Secondary | ICD-10-CM | POA: Diagnosis not present

## 2018-05-24 DIAGNOSIS — Z8579 Personal history of other malignant neoplasms of lymphoid, hematopoietic and related tissues: Secondary | ICD-10-CM | POA: Diagnosis not present

## 2018-05-24 DIAGNOSIS — Z9889 Other specified postprocedural states: Secondary | ICD-10-CM | POA: Diagnosis not present

## 2018-05-24 DIAGNOSIS — K2970 Gastritis, unspecified, without bleeding: Secondary | ICD-10-CM | POA: Diagnosis not present

## 2018-05-24 MED ORDER — SODIUM CHLORIDE 0.9 % IV SOLN: 500.0000 mL | Freq: Once | INTRAVENOUS | Status: DC

## 2018-05-24 NOTE — Progress Notes (Signed)
Report given to PACU, vss 

## 2018-05-24 NOTE — Progress Notes (Signed)
Pt stated that she felt woosy.  Had her lay down for more time.  Then she can move to recliner room if necessary. Linda [name]/ Recovery Room

## 2018-05-24 NOTE — Op Note (Signed)
[place] Patient Name: Linda [name] Procedure Date: 05/24/2018 7:48 AM MRN: [id number] Endoscopist: Milus Banister , MD Age: 84 Referring MD:  Date of Birth: Jul 15, 1932 Gender: Female Account #: [id number] Procedure:                Upper GI endoscopy Indications:              history of MALToma, treated elsewhere Medicines:                Monitored Anesthesia Care Procedure:                Pre-Anesthesia Assessment:                           - Prior to the procedure, a History and Physical                            was performed, and patient medications and                            allergies were reviewed. The patient's tolerance of                            previous anesthesia was also reviewed. The risks                            and benefits of the procedure and the sedation                            options and risks were discussed with the patient.                            All questions were answered, and informed consent                            was obtained. Prior Anticoagulants: The patient has                            taken no previous anticoagulant or antiplatelet                            agents. ASA Grade Assessment: II - A patient with                            mild systemic disease. After reviewing the risks                            and benefits, the patient was deemed in                            satisfactory condition to undergo the procedure.                           After obtaining informed consent, the endoscope was  passed under direct vision. Throughout the                            procedure, the patient's blood pressure, pulse, and                            oxygen saturations were monitored continuously. The                            Endoscope was introduced through the mouth, and                            advanced to the second part of duodenum. The upper                            GI endoscopy was  accomplished without difficulty.                            The patient tolerated the procedure well. Scope In: Scope Out: Findings:                 Mild, non-specific inflammation in the stomach,                            most prominant distally. The stomach was biopsied:                            distal jar 1, proximal jar 2.                           The exam was otherwise without abnormality. Complications:            No immediate complications. Estimated blood loss:                            None. Estimated Blood Loss:     Estimated blood loss: none. Impression:               - Mild, non-specific inflammation in the stomach,                            most prominant distally. The stomach was biopsied:                            distal jar 1, proximal jar 2.                           - The examination was otherwise normal. Recommendation:           - Patient has a contact number available for                            emergencies. The signs and symptoms of potential                            delayed complications were discussed with the  patient. Return to normal activities tomorrow.                            Written discharge instructions were provided to the                            patient.                           - Resume previous diet.                           - Continue present medications.                           - Await pathology results, will determine                            need/timing of further surveillance. Milus Banister, MD 05/24/2018 8:04:49 AM This report has been signed electronically.

## 2018-05-24 NOTE — Progress Notes (Signed)
Called to room to assist during endoscopic procedure.  Patient ID and intended procedure confirmed with present staff. Received instructions for my participation in the procedure from the performing physician.  

## 2018-05-24 NOTE — Progress Notes (Signed)
No change in medical history since previsit 

## 2018-05-24 NOTE — Patient Instructions (Signed)
Handout given on gastritis.    YOU HAD AN ENDOSCOPIC PROCEDURE TODAY AT THE [facility]:   Refer to the procedure report that was given to you for any specific questions about what was found during the examination.  If the procedure report does not answer your questions, please call your gastroenterologist to clarify.  If you requested that your care partner not be given the details of your procedure findings, then the procedure report has been included in a sealed envelope for you to review at your convenience later.  YOU SHOULD EXPECT: Some feelings of bloating in the abdomen. Passage of more gas than usual.  Walking can help get rid of the air that was put into your GI tract during the procedure and reduce the bloating. If you had a lower endoscopy (such as a colonoscopy or flexible sigmoidoscopy) you may notice spotting of blood in your stool or on the toilet paper. If you underwent a bowel prep for your procedure, you may not have a normal bowel movement for a few days.  Please Note:  You might notice some irritation and congestion in your nose or some drainage.  This is from the oxygen used during your procedure.  There is no need for concern and it should clear up in a day or so.  SYMPTOMS TO REPORT IMMEDIATELY:   Following upper endoscopy (EGD)  Vomiting of blood or coffee ground material  New chest pain or pain under the shoulder blades  Painful or persistently difficult swallowing  New shortness of breath  Fever of 100F or higher  Black, tarry-looking stools  For urgent or emergent issues, a gastroenterologist can be reached at any hour by calling [phone].   DIET:  We do recommend a small meal at first, but then you may proceed to your regular diet.  Drink plenty of fluids but you should avoid alcoholic beverages for 24 hours.  ACTIVITY:  You should plan to take it easy for the rest of today and you should NOT DRIVE or use heavy machinery until tomorrow  (because of the sedation medicines used during the test).    FOLLOW UP: Our staff will call the number listed on your records the next business day following your procedure to check on you and address any questions or concerns that you may have regarding the information given to you following your procedure. If we do not reach you, we will leave a message.  However, if you are feeling well and you are not experiencing any problems, there is no need to return our call.  We will assume that you have returned to your regular daily activities without incident.  If any biopsies were taken you will be contacted by phone or by letter within the next 1-3 weeks.  Please call us at [phone] if you have not heard about the biopsies in 3 weeks.    SIGNATURES/CONFIDENTIALITY: You and/or your care partner have signed paperwork which will be entered into your electronic medical record.  These signatures attest to the fact that that the information above on your After Visit Summary has been reviewed and is understood.  Full responsibility of the confidentiality of this discharge information lies with you and/or your care-partner. 

## 2018-05-25 ENCOUNTER — Telehealth: Payer: Self-pay | Admitting: *Deleted

## 2018-05-25 NOTE — Telephone Encounter (Signed)
We discussed the procedure findings, plan

## 2018-05-25 NOTE — Telephone Encounter (Signed)
  Follow up Call-  Call back number 05/24/2018  Post procedure Call Back phone  # [phone]  Permission to leave phone message Yes     Patient questions:  Do you have a fever, pain , or abdominal swelling? No. Pain Score  0 *  Have you tolerated food without any problems? Yes.    Have you been able to return to your normal activities? Yes.    Do you have any questions about your discharge instructions: Diet   No. Medications  No. Follow up visit  Yes.    Do you have questions or concerns about your Care? Yes.    Actions: * If pain score is 4 or above: No action needed, pain <4. Patient very verbal that she did not speak with the MD post procedure. Reviewed procedure report with the patient. Patient still insistent that she diod not have any recall of speaking with MD. Patient stating "bottom line , I would like to speak to /Dr. Ardis Hughs."

## 2018-05-28 ENCOUNTER — Telehealth: Payer: Self-pay | Admitting: Gastroenterology

## 2018-05-28 NOTE — Telephone Encounter (Signed)
See resutls note from today.  thanks

## 2018-05-28 NOTE — Telephone Encounter (Signed)
Dr Ardis Hughs have you seen the pathology?

## 2018-05-28 NOTE — Telephone Encounter (Signed)
The pt has been given the pathology results.  The pt has been advised of the information and verbalized understanding.

## 2018-05-28 NOTE — Telephone Encounter (Signed)
Linda Banister, MD  Linda Lasso, RN Cc: Linda Relic, RN          Linda [name], please call her. THe gastric biopsies show no evidence of lymphoma, maltoma. She does not need follow up EGD   Linda [name], no result note is needed.

## 2018-05-28 NOTE — Telephone Encounter (Signed)
Patient would like a call from the nurse to discuss results or when results will be back from endo on 6.10.19.

## 2018-06-04 DIAGNOSIS — Z7289 Other problems related to lifestyle: Secondary | ICD-10-CM | POA: Diagnosis not present

## 2018-06-04 DIAGNOSIS — R490 Dysphonia: Secondary | ICD-10-CM | POA: Diagnosis not present

## 2018-06-08 DIAGNOSIS — J329 Chronic sinusitis, unspecified: Secondary | ICD-10-CM | POA: Diagnosis not present

## 2018-06-08 DIAGNOSIS — R490 Dysphonia: Secondary | ICD-10-CM | POA: Diagnosis not present

## 2018-06-24 DIAGNOSIS — H04121 Dry eye syndrome of right lacrimal gland: Secondary | ICD-10-CM | POA: Diagnosis not present

## 2018-06-29 ENCOUNTER — Encounter: Payer: Self-pay | Admitting: Family Medicine

## 2018-06-29 ENCOUNTER — Ambulatory Visit (INDEPENDENT_AMBULATORY_CARE_PROVIDER_SITE_OTHER): Payer: Medicare Other | Admitting: Family Medicine

## 2018-06-29 VITALS — BP 124/82 | HR 60 | Temp 97.7°F | Resp 16 | Ht 65.0 in

## 2018-06-29 DIAGNOSIS — M7061 Trochanteric bursitis, right hip: Secondary | ICD-10-CM

## 2018-06-29 DIAGNOSIS — M25551 Pain in right hip: Secondary | ICD-10-CM

## 2018-06-29 MED ORDER — DICLOFENAC SODIUM 1 % TD GEL: 4.0000 g | Freq: Four times a day (QID) | TRANSDERMAL | 3 refills | Status: DC

## 2018-06-29 MED ORDER — TRIAMCINOLONE ACETONIDE 40 MG/ML IJ SUSP
20.0000 mg | Freq: Once | INTRAMUSCULAR | Status: AC
  Administered 2018-06-29: 20 mg via INTRAMUSCULAR

## 2018-06-29 NOTE — Progress Notes (Signed)
ACUTE VISIT   HPI:  Chief Complaint  Patient presents with  . Back Pain    ride-sided, localized to right thigh started Wednesday night    Linda [name] is a 84 y.o. female, who is here today complaining of 5 days of right hip/thigh pain.  She points to lateral aspect of right hip. Pain is intermittent, worse with standing up or first thing in the morning when she gets up,limping for a few minutes. It is alleviated by rest. It does not interferes with sleep.  She remembers having similar pain about 3 years ago, it was related to sleeping on a softer mattress and resolved within 6 weeks and after rotating mattress.  She has an old mattress, she just ordered a new one yesterday.  No associated fever, chills, abdominal pain, back pain, numbness, or tingling.  Pain is achy and sharp, 3-5/10. It has been stable since onset.  No history of trauma. She has history of generalized OA. She usually takes Tylenol 1000 mg 3 times daily for OA.  She has applied local heat. She has not noted local erythema, edema, or rash.   Review of Systems  Constitutional: Negative for chills, fatigue and fever.  Respiratory: Negative for shortness of breath and wheezing.   Cardiovascular: Negative for leg swelling.  Gastrointestinal: Negative for abdominal pain, nausea and vomiting.  Genitourinary: Negative for decreased urine volume, dysuria and hematuria.  Musculoskeletal: Positive for arthralgias and gait problem. Negative for back pain and joint swelling.  Skin: Negative for rash and wound.  Neurological: Negative for weakness and numbness.      Current Outpatient Medications on File Prior to Visit  Medication Sig Dispense Refill  . acetaminophen (TYLENOL) 500 MG tablet Take 500 mg by mouth every 6 (six) hours as needed.    . bimatoprost (LUMIGAN) 0.01 % SOLN Place 1 drop into both eyes at bedtime.    . brimonidine (ALPHAGAN) 0.15 % ophthalmic solution Place 1 drop into both  eyes 3 (three) times daily.    . Catheters MISC by Does not apply route.    . cetirizine (ZYRTEC) 10 MG tablet Take 10 mg by mouth daily.    . eszopiclone (LUNESTA) 2 MG TABS tablet Take 1 tablet (2 mg total) by mouth at bedtime as needed for sleep. 30 tablet 3  . fluorometholone (FML) 0.1 % ophthalmic suspension INSTILL 1 DROP INTO RIGHT EYE 4 TIMES DAILY FOR 3 TO 5 DAYS  0  . fluticasone (FLONASE) 50 MCG/ACT nasal spray   99  . loperamide (IMODIUM) 2 MG capsule Take by mouth.    . losartan (COZAAR) 50 MG tablet Take 1 tablet (50 mg total) by mouth daily. 90 tablet 2  . nitrofurantoin (MACRODANTIN) 50 MG capsule     . rosuvastatin (CRESTOR) 5 MG tablet      Current Facility-Administered Medications on File Prior to Visit  Medication Dose Route Frequency Provider Last Rate Last Dose  . 0.9 %  sodium chloride infusion  500 mL Intravenous Once Milus Banister, MD         Past Medical History:  Diagnosis Date  . Allergy   . Anxiety   . Arthritis   . Cancer [facility])    UTERINE CANCER  . GERD (gastroesophageal reflux disease)   . History of blood transfusion   . Hyperlipidemia   . Hypertension   . Insomnia   . Osteopenia    MILD  . Urine retention   .  Uterine cancer ([place])    at age 40   No Known Allergies  Social History   Socioeconomic History  . Marital status: Single    Spouse name: Not on file  . Number of children: Not on file  . Years of education: Not on file  . Highest education level: Not on file  Occupational History  . Not on file  Social Needs  . Financial resource strain: Not on file  . Food insecurity:    Worry: Not on file    Inability: Not on file  . Transportation needs:    Medical: Not on file    Non-medical: Not on file  Tobacco Use  . Smoking status: Never Smoker  . Smokeless tobacco: Never Used  Substance and Sexual Activity  . Alcohol use: Yes    Comment: one glass of wine with dinner  . Drug use: No  . Sexual activity: Never  Lifestyle    . Physical activity:    Days per week: Not on file    Minutes per session: Not on file  . Stress: Not on file  Relationships  . Social connections:    Talks on phone: Not on file    Gets together: Not on file    Attends religious service: Not on file    Active member of club or organization: Not on file    Attends meetings of clubs or organizations: Not on file    Relationship status: Not on file  Other Topics Concern  . Not on file  Social History Narrative  . Not on file    Vitals:   06/29/18 1144  BP: 124/82  Pulse: 60  Resp: 16  Temp: 97.7 F (36.5 C)  SpO2: 96%   Body mass index is 34.61 kg/m.   Physical Exam  Nursing note and vitals reviewed. Constitutional: She is oriented to person, place, and time. She appears well-developed. She does not appear ill. No distress.  HENT:  Head: Normocephalic and atraumatic.  Eyes: Conjunctivae are normal.  Cardiovascular: Normal rate and regular rhythm.  Pulses:      Dorsalis pedis pulses are 2+ on the right side.  Respiratory: Effort normal and breath sounds normal. No respiratory distress.  GI: Soft. She exhibits no mass. There is no tenderness.  Musculoskeletal: She exhibits tenderness. She exhibits no edema.       Lumbar back: She exhibits tenderness (mild). She exhibits no bony tenderness.       Back:  Mild tenderness on right lumbar paraspinal muscles, L4-5. Right hip: Mild tenderness elicited with full flexion.  Mild limitation on hip rotation. Moderate to severe pain upon palpation of right great trochanteric bursa. No erythema or edema appreciated. Antalgic gait.  Neurological: She is alert and oriented to person, place, and time. She has normal strength.  Patellar DTRs 1+ to 2+, symmetric.   Mildly unstable, antalgic gait assisted with a cane. SLR negative bilateral.  Skin: Skin is warm. No rash noted. No erythema.  Psychiatric: She has a normal mood and affect.  Well groomed, good eye contact.       ASSESSMENT AND PLAN:   Linda [name] was seen today for back pain.  Diagnoses and all orders for this visit:  Hip pain, right  Possible etiology discussed, including OA, radicular pain, and bursitis. She cannot take oral NSAIDs, so recommend topical diclofenac 4 times per day as needed. We will arrange PT. If pain is persistent we will consider referral to orthopedics. Follow-up as needed.  -  diclofenac sodium (VOLTAREN) 1 % GEL; Apply 4 g topically 4 (four) times daily.  Trochanteric bursitis of right hip  Physical findings suggest trochanteric bursitis, after discussing treatment options, she agrees with steroid injection. After verbal consent and in sterile fashion, she received Kenalog 20 mg and plain lidocaine 2% 1.5 cc injected perpendicular to right femur in the area of maximal tenderness. She tolerated procedure well. No complications or blood lose. After 10 minutes she reported great improvement of pain, still some limping. Postprocedure instructions given. Follow-up as needed.  -     triamcinolone acetonide (KENALOG-40) injection 20 mg   Return if symptoms worsen or fail to improve.     Shalana Jardin G. Martinique, MD  [facility]. [place] office.

## 2018-06-29 NOTE — Patient Instructions (Addendum)
A few things to remember from today's visit:   Hip pain, right - Plan: diclofenac sodium (VOLTAREN) 1 % GEL, Ambulatory referral to Physical Therapy  Trochanteric bursitis of right hip - Plan: triamcinolone acetonide (KENALOG-40) injection 20 mg, Ambulatory referral to Physical Therapy Trochanteric Bursitis Trochanteric bursitis is a condition that causes hip pain. Trochanteric bursitis happens when fluid-filled sacs (bursae) in the hip get irritated. Normally these sacs absorb shock and help strong bands of tissue (tendons) in your hip glide smoothly over each other and over your hip bones. What are the causes? This condition results from increased friction between the hip bones and the tendons that go over them. This condition can happen if you:  Have weak hips.  Use your hip muscles too much (overuse).  Get hit in the hip.  What increases the risk? This condition is more likely to develop in:  Women.  Adults who are middle-aged or older.  People with arthritis or a spinal condition.  People with weak buttocks muscles (gluteal muscles).  People who have one leg that is shorter than the other.  People who participate in certain kinds of athletic activities, such as: ? Running sports, especially long-distance running. ? Contact sports, like football or martial arts. ? Sports in which falls may occur, like skiing.  What are the signs or symptoms? The main symptom of this condition is pain and tenderness over the point of your hip. The pain may be:  Sharp and intense.  Dull and achy.  Felt on the outside of your thigh.  It may increase when you:  Lie on your side.  Walk or run.  Go up on stairs.  Sit.  Stand up after sitting.  Stand for long periods of time.  How is this diagnosed? This condition may be diagnosed based on:  Your symptoms.  Your medical history.  A physical exam.  Imaging tests, such as: ? X-rays to check your bones. ? An MRI or  ultrasound to check your tendons and muscles.  During your physical exam, your health care provider will check the movement and strength of your hip. He or she may press on the point of your hip to check for pain. How is this treated? This condition may be treated by:  Resting.  Reducing your activity.  Avoiding activities that cause pain.  Using crutches, a cane, or a walker to decrease the strain on your hip.  Taking medicine to help with swelling.  Having medicine injected into the bursae to help with swelling.  Using ice, heat, and massage therapy for pain relief.  Physical therapy exercises for strength and flexibility.  Surgery (rare).  Follow these instructions at home: Activity  Rest.  Avoid activities that cause pain.  Return to your normal activities as told by your health care provider. Ask your health care provider what activities are safe for you. Managing pain, stiffness, and swelling  Take over-the-counter and prescription medicines only as told by your health care provider.  If directed, apply heat to the injured area as told by your health care provider. ? Place a towel between your skin and the heat source. ? Leave the heat on for 20-30 minutes. ? Remove the heat if your skin turns bright red. This is especially important if you are unable to feel pain, heat, or cold. You may have a greater risk of getting burned.  If directed, apply ice to the injured area: ? Put ice in a plastic bag. ? Place a  towel between your skin and the bag. ? Leave the ice on for 20 minutes, 2-3 times a day. General instructions  If the affected leg is one that you use for driving, ask your health care provider when it is safe to drive.  Use crutches, a cane, or a walker as told by your health care provider.  If one of your legs is shorter than the other, get fitted for a shoe insert.  Lose weight if you are overweight. How is this prevented?  Wear supportive footwear  that is appropriate for your sport.  If you have hip pain, start any new exercise or sport slowly.  Maintain physical fitness, including: ? Strength. ? Flexibility. Contact a health care provider if:  Your pain does not improve with 2-4 weeks. Get help right away if:  You develop severe pain.  You have a fever.  You develop increased redness over your hip.  You have a change in your bowel function or bladder function.  You cannot control the muscles in your feet. This information is not intended to replace advice given to you by your health care provider. Make sure you discuss any questions you have with your health care provider. Document Released: 01/08/2005 Document Revised: 08/06/2016 Document Reviewed: 11/16/2015 Elsevier Interactive Patient Education  2018 [employer] Kenalog 20 mg with plain lidocaine 2% injected in right trochanteric bursa. He will start feeling results of injected a steroid in about 48 hours. If pain continues in 2 to 3 weeks please let me know, in this case we will consider OPT or ortho evaluation.  Local ice.  Fall precautions.

## 2018-07-03 ENCOUNTER — Encounter: Payer: Self-pay | Admitting: Family Medicine

## 2018-07-03 ENCOUNTER — Ambulatory Visit (INDEPENDENT_AMBULATORY_CARE_PROVIDER_SITE_OTHER): Payer: Medicare Other | Admitting: Family Medicine

## 2018-07-03 VITALS — BP 126/74 | HR 61 | Temp 98.1°F | Ht 65.0 in

## 2018-07-03 DIAGNOSIS — M25551 Pain in right hip: Secondary | ICD-10-CM

## 2018-07-03 MED ORDER — PREDNISONE 50 MG PO TABS: ORAL_TABLET | ORAL | 0 refills | Status: DC

## 2018-07-03 NOTE — Progress Notes (Signed)
   Subjective:  Linda [name] is a 84 y.o. female who presents today for same-day appointment with a chief complaint of hip pain.   HPI:  Hip Pain, Acute problem Patient seen 4 days ago by her PCP for this. Underwent kenalog injection at that time for trochanteric bursitis. Pain has worsened over the last 4 days.  She takes Tylenol for osteoarthritis which does not help.  She is now having significant difficulty walking due to the pain.  No fevers.  No chills.  No rashes to the area.  ROS: Per HPI  PMH: She reports that she has never smoked. She has never used smokeless tobacco. She reports that she drinks alcohol. She reports that she does not use drugs.  Objective:  Physical Exam: BP 126/74 (BP Location: Left Arm, Patient Position: Sitting, Cuff Size: Normal)   Pulse 61   Temp 98.1 F (36.7 C) (Oral)   Ht 5\' 5"  (1.651 m)   SpO2 96%   BMI 34.61 kg/m   Gen: NAD, resting comfortably CV: RRR with no murmurs appreciated Pulm: NWOB, CTAB with no crackles, wheezes, or rhonchi MSK:  -Right hip: No deformities.  Mild tenderness elicited with passive external rotation.  Strength 5 out of 5 in all directions.  Straight leg raise negative. Skin: Warm, dry  Assessment/Plan:  Right hip pain Possible patient is having steroid flare from her trochanteric bursitis injection.  She also has some muscular tenderness in her gluteal area that could be playing a role.  Per her last PCPs note, she cannot tolerate oral NSAIDs.  We will start 5-day prednisone burst.  She will continue her Tylenol.  Discussed conservative measures including avoidance of activities that worsen her pain and use of heat/ice to the area.  Discussed reasons to return to care.  Algis Greenhouse. Jerline Pain, MD 07/03/2018 10:54 AM

## 2018-07-03 NOTE — Patient Instructions (Addendum)
It was very nice to see you today!  Please start the prednisone.   Avoid activities that make the pain worse.  Please let your primary know if your symptoms do not improve over the next few days.   Take care, Dr Jerline Pain

## 2018-07-06 ENCOUNTER — Ambulatory Visit: Payer: Medicare Other | Attending: Family Medicine | Admitting: Physical Therapy

## 2018-07-06 DIAGNOSIS — R262 Difficulty in walking, not elsewhere classified: Secondary | ICD-10-CM | POA: Diagnosis not present

## 2018-07-06 DIAGNOSIS — M6281 Muscle weakness (generalized): Secondary | ICD-10-CM | POA: Diagnosis not present

## 2018-07-06 DIAGNOSIS — M25551 Pain in right hip: Secondary | ICD-10-CM | POA: Insufficient documentation

## 2018-07-06 NOTE — Therapy (Signed)
[facility] [address] [place], [place], [address] Phone: [phone]   Fax:  [phone]  Physical Therapy Evaluation  Patient Details  Name: Linda [name] QVZDGLOVF MRN: [id number] Date of Birth: [birth year] Referring Provider: Martinique, Betty G, MD   Encounter Date: 07/06/2018  PT End of Session - 07/06/18 1247    Visit Number  1    Date for PT Re-Evaluation  08/31/18    PT Start Time  1147    PT Stop Time  1228    PT Time Calculation (min)  41 min    Activity Tolerance  Patient tolerated treatment well    Behavior During Therapy  [facility] for tasks assessed/performed       Past Medical History:  Diagnosis Date  . Allergy   . Anxiety   . Arthritis   . Cancer [facility])    UTERINE CANCER  . GERD (gastroesophageal reflux disease)   . History of blood transfusion   . Hyperlipidemia   . Hypertension   . Insomnia   . Osteopenia    MILD  . Urine retention   . Uterine cancer ([place])    at age 84    Past Surgical History:  Procedure Laterality Date  . ABDOMINAL HYSTERECTOMY     AGE 3  . APPENDECTOMY     AGE 6  . BLADDER SURGERY    . Catarct Surgery    . LAPAROSCOPIC SMALL BOWEL RESECTION    . NM PET DX LYMPHOMA  10/2018   IN REMISSION  . occular pressure    . OTHER SURGICAL HISTORY    . REPLACEMENT TOTAL KNEE BILATERAL    . TONSILLECTOMY AND ADENOIDECTOMY    . UTERINE CANCER SURGERY      There were no vitals filed for this visit.   Subjective Assessment - 07/06/18 1150    Subjective  I have pain in the morning and hard to get up.  I have pain when walking in the right glutes and down into the thigh.  Pt states pain started a week ago.  Had an injection and that did not do anything.  I was put on prednisone and it may have helped a little.  I started using a gel yesterday.    Pertinent History  bilat knee replacements, arthritis throughout    How long can you sit comfortably?  a long time    How long can you  stand comfortably?  a long time    Patient Stated Goals  get rid of hip pain    Currently in Pain?  Yes         [facility] PT Assessment - 07/06/18 0001      Assessment   Medical Diagnosis  M25.551 (ICD-10-CM) - Hip pain, right; M70.61 (ICD-10-CM) - Trochanteric bursitis of right hip    Referring Provider  Martinique, Betty G, MD    Onset Date/Surgical Date  06/28/18    Prior Therapy  No      Precautions   Precautions  None      Restrictions   Weight Bearing Restrictions  No      Balance Screen   Has the patient fallen in the past 6 months  No      [place]      Prior Function   Level of [place]  Retired    Leisure  walking and doing water aerobics  Cognition   Overall Cognitive Status  Within Functional Limits for tasks assessed      Observation/Other Assessments   Focus on Therapeutic Outcomes (FOTO)   65% limited      Posture/Postural Control   Posture/Postural Control  Postural limitations    Postural Limitations  Rounded Shoulders;Increased thoracic kyphosis;Flexed trunk      ROM / Strength   AROM / PROM / Strength  AROM;Strength      AROM   AROM Assessment Site  Lumbar    Lumbar Extension  25% limited      Strength   Strength Assessment Site  Hip;Knee;Ankle    Right/Left Hip  Right;Left    Right Hip Flexion  3+/5 pain    Right Hip Extension  4-/5    Right Hip External Rotation   5/5    Right Hip Internal Rotation  5/5    Right Hip ABduction  3+/5 pain    Right Hip ADduction  4/5    Left Hip Flexion  4+/5    Left Hip Extension  4+/5    Left Hip External Rotation  5/5    Left Hip Internal Rotation  5/5    Left Hip ABduction  4+/5    Left Hip ADduction  5/5    Right/Left Knee  Right;Left    Right Knee Flexion  5/5    Right Knee Extension  5/5    Left Knee Flexion  5/5    Left Knee Extension  5/5    Right/Left Ankle  -- 4/5 ankle DF      Palpation   Palpation  comment  tender to right hip bursa, tight and tender right glute med and IT band      Ambulation/Gait   Assistive device  Rolling walker    Gait Pattern  Decreased stance time - right;Decreased stride length;Trunk flexed                Objective measurements completed on examination: See above findings.      [facility] Adult PT Treatment/Exercise - 07/06/18 0001      Self-Care   Self-Care  Other Self-Care Comments    Other Self-Care Comments   educated on sleep with piillow btwn knees, clams             PT Education - 07/06/18 1246    Education Details   Access Code: [email or web address]     Person(s) Educated  Patient    Methods  Explanation;Demonstration;Handout    Comprehension  Verbalized understanding;Returned demonstration       PT Short Term Goals - 07/06/18 1501      PT SHORT TERM GOAL #1   Title  ind with initial HEP    Time  2    Period  Weeks    Status  New    Target Date  07/20/18      PT SHORT TERM GOAL #2   Title  Pt able to walk with even stride using LRAD    Time  4    Period  Weeks    Status  New    Target Date  08/03/18        PT Long Term Goals - 07/06/18 1514      PT LONG TERM GOAL #1   Title  Pt will be able to walk to her aquatic class without AD    Time  8    Period  Weeks    Status  New    Target Date  08/31/18      PT LONG TERM GOAL #2   Title  Pt will be able to perform right hip abduction with 4+/5 MMT for improved gait    Time  8    Period  Weeks    Status  New    Target Date  08/31/18      PT LONG TERM GOAL #3   Title  pt will report 60% less pain during typical day    Time  8    Period  Weeks    Status  New    Target Date  08/31/18      PT LONG TERM GOAL #4   Title  FOTO < or = 46% limited for greater function    Time  8    Period  Weeks    Status  New    Target Date  08/31/18      PT LONG TERM GOAL #5   Title  Pt will complete 6 min walk test within age related norms    Time  8    Period  Weeks    Status   New    Target Date  08/31/18             Plan - 07/06/18 1401    Clinical Impression Statement  Pt presents to skilled PT due pain in her right hip that came on one week ago. Currently she is ambulating with RW and normally does not use any AD.  Pt demonstrates tenderness of gluteal and trochanteric bursa.  Pt has tight and tender glutes and TFL on the right side.  Pt has bilateral LE weakness.  She has gait and postural abnormalites as mentioned above.  Pt will benefit from skilled PT to address impairments and return safely to walking normal functional activities.    History and Personal Factors relevant to plan of care:  bilateral TKA    Clinical Presentation  Stable    Clinical Presentation due to:  pt is stable    Clinical Decision Making  Low    Rehab Potential  Excellent    PT Frequency  2x / week    PT Duration  8 weeks    PT Treatment/Interventions  ADLs/Self Care Home Management;Biofeedback;Cryotherapy;Electrical Stimulation;Moist Heat;Passive range of motion;Dry needling;Taping;Manual techniques;Neuromuscular re-education;Therapeutic exercise;Therapeutic activities    PT Next Visit Plan  6 min walk test, hip and core strengthening, gait, STM to glutes and TFL, heat/ice and estim as needed for pain management    PT Home Exercise Plan   Access Code: [email or web address]     Recommended Other Services  eval 07/06/18    Consulted and Agree with Plan of Care  Patient       Patient will benefit from skilled therapeutic intervention in order to improve the following deficits and impairments:  Abnormal gait, Pain, Increased muscle spasms, Decreased strength, Decreased range of motion, Difficulty walking  Visit Diagnosis: Pain in right hip  Muscle weakness (generalized)  Difficulty in walking, not elsewhere classified     Problem List Patient Active Problem List   Diagnosis Date Noted  . Hypertension, essential, benign 01/16/2018  . Sensorineural hearing loss (SNHL) of both ears  01/16/2018  . Insomnia 01/16/2018  . MALT lymphoma ([place]) 01/15/2018  . 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 Osteopenia of both hips 01/15/2018  . Hyperlipidemia 01/15/2018    Zannie Cove, PT 07/06/2018, 5:24 PM  [facility] [address] [place],  [place], [address] Phone: [phone]   Fax:  [phone]  Name: AKIRRA LACERDA MRN: [id number] Date of Birth: 04/14/1932

## 2018-07-06 NOTE — Patient Instructions (Signed)
Access Code: [email or web address]  URL: [email or web address]  Date: 07/06/2018  Prepared by: Lovett Calender   Exercises  Clamshell - 10 reps - 2 sets - 1x daily - 7x weekly

## 2018-07-07 ENCOUNTER — Ambulatory Visit: Payer: Medicare Other | Admitting: Physical Therapy

## 2018-07-07 DIAGNOSIS — M6281 Muscle weakness (generalized): Secondary | ICD-10-CM | POA: Diagnosis not present

## 2018-07-07 DIAGNOSIS — M25551 Pain in right hip: Secondary | ICD-10-CM

## 2018-07-07 DIAGNOSIS — R262 Difficulty in walking, not elsewhere classified: Secondary | ICD-10-CM | POA: Diagnosis not present

## 2018-07-07 NOTE — Therapy (Signed)
[facility] [address] [place], [place], [address] Phone: [phone]   Fax:  [phone]  Physical Therapy Treatment  Patient Details  Name: Linda [name] YJEHUDJSH MRN: [id number] Date of Birth: [birth year] Referring Provider: Martinique, Betty G, MD   Encounter Date: 07/07/2018  PT End of Session - 07/07/18 1354    Visit Number  2    Date for PT Re-Evaluation  08/31/18    PT Start Time  1355    PT Stop Time  1500    PT Time Calculation (min)  65 min    Activity Tolerance  Patient tolerated treatment well    Behavior During Therapy  [facility] for tasks assessed/performed       Past Medical History:  Diagnosis Date  . Allergy   . Anxiety   . Arthritis   . Cancer [facility])    UTERINE CANCER  . GERD (gastroesophageal reflux disease)   . History of blood transfusion   . Hyperlipidemia   . Hypertension   . Insomnia   . Osteopenia    MILD  . Urine retention   . Uterine cancer ([place])    at age 84    Past Surgical History:  Procedure Laterality Date  . ABDOMINAL HYSTERECTOMY     AGE 84  . APPENDECTOMY     AGE 84  . BLADDER SURGERY    . Catarct Surgery    . LAPAROSCOPIC SMALL BOWEL RESECTION    . NM PET DX LYMPHOMA  10/2018   IN REMISSION  . occular pressure    . OTHER SURGICAL HISTORY    . REPLACEMENT TOTAL KNEE BILATERAL    . TONSILLECTOMY AND ADENOIDECTOMY    . UTERINE CANCER SURGERY      There were no vitals filed for this visit.  Subjective Assessment - 07/07/18 1400    Subjective  Painful all night and all day. Pt with antalgic gait using the RW. "Not a good day."    Pertinent History  bilat knee replacements, arthritis throughout    Currently in Pain?  Yes    Pain Score  8     Pain Location  Hip    Pain Orientation  Right    Pain Descriptors / Indicators  Sharp;Sore    Multiple Pain Sites  No                       [facility] Adult PT Treatment/Exercise - 07/07/18 0001      Knee/Hip  Exercises: Stretches   Other Knee/Hip Stretches  RT knee to chest 20 sec 3x    Other Knee/Hip Stretches  Lower trunk rotation  to LT 3x 20 sec      Knee/Hip Exercises: Aerobic   Nustep  L1 x 5 min PTA present for status      Manual Therapy   Soft tissue mobilization  RT lateral hip    Myofascial Release  RT lateral hip in LT sidelying             PT Education - 07/07/18 1444    Education Details  Additional HEP for RT hip stretches    Person(s) Educated  Patient    Methods  Explanation;Demonstration;Tactile cues;Verbal cues;Handout    Comprehension  Returned demonstration;Verbalized understanding       PT Short Term Goals - 07/07/18 1441      PT SHORT TERM GOAL #1   Title  ind with initial HEP    Time  2    Period  Weeks    Status  Achieved        PT Long Term Goals - 07/06/18 1514      PT LONG TERM GOAL #1   Title  Pt will be able to walk to her aquatic class without AD    Time  8    Period  Weeks    Status  New    Target Date  08/31/18      PT LONG TERM GOAL #2   Title  Pt will be able to perform right hip abduction with 4+/5 MMT for improved gait    Time  8    Period  Weeks    Status  New    Target Date  08/31/18      PT LONG TERM GOAL #3   Title  pt will report 60% less pain during typical day    Time  8    Period  Weeks    Status  New    Target Date  08/31/18      PT LONG TERM GOAL #4   Title  FOTO < or = 46% limited for greater function    Time  8    Period  Weeks    Status  New    Target Date  08/31/18      PT LONG TERM GOAL #5   Title  Pt will complete 6 min walk test within age related norms    Time  8    Period  Weeks    Status  New    Target Date  08/31/18            Plan - 07/07/18 1354    Clinical Impression Statement  Pt presents today with high level of pain and antalgic gait using a rolling walker. She is attempting to do her initial HEP but does not lift/rotate her hip much actively.   HEP was added to today to  include gentle stretching to Rt hip. Soft tissues to lateral Rt hip are dense and tender today. Used Estim at end of session for pain.  Too early for goals.     Rehab Potential  Excellent    PT Frequency  2x / week    PT Duration  8 weeks    PT Treatment/Interventions  ADLs/Self Care Home Management;Biofeedback;Cryotherapy;Electrical Stimulation;Moist Heat;Passive range of motion;Dry needling;Taping;Manual techniques;Neuromuscular re-education;Therapeutic exercise;Therapeutic activities    PT Next Visit Plan  Assess pain, Nustep, review stretches given today, manual to lateral hip, estim if beneficial    PT Home Exercise Plan   Access Code: [email or web address]     Consulted and Agree with Plan of Care  Patient       Patient will benefit from skilled therapeutic intervention in order to improve the following deficits and impairments:  Abnormal gait, Pain, Increased muscle spasms, Decreased strength, Decreased range of motion, Difficulty walking  Visit Diagnosis: Pain in right hip  Muscle weakness (generalized)  Difficulty in walking, not elsewhere classified     Problem List Patient Active Problem List   Diagnosis Date Noted  . Hypertension, essential, benign 01/16/2018  . Sensorineural hearing loss (SNHL) of both ears 01/16/2018  . Insomnia 01/16/2018  . MALT lymphoma ([place]) 01/15/2018  . 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 Osteopenia of both hips 01/15/2018  . Hyperlipidemia 01/15/2018    Aldon Hengst, PTA 07/07/2018, 2:46 PM  [facility] [address], [address] [place],  [place], [address] Phone: [phone]   Fax:  [phone]  Name: Linda [name] MRN: [id number] Date of Birth: 04/14/1932  Access Code: [email or web address]  URL: [email or web address]  Date: 07/07/2018  Prepared by: Myrene Galas   Exercises  Clamshell - 10 reps - 2 sets - 1x daily - 7x weekly  Supine 90/90 Isometric Hip Abduction -  10 reps - 1 sets - 5 hold - 2x daily - 7x weekly  Hooklying Single Knee to Chest Stretch - 5 reps - 3 sets - 20 hold - 3x daily - 7x weekly  Supine Lower Trunk Rotation - 5 reps - 2 sets - 20 hold - 3x daily - 7x weekly

## 2018-07-13 ENCOUNTER — Ambulatory Visit: Payer: Medicare Other | Admitting: Physical Therapy

## 2018-07-13 DIAGNOSIS — M6281 Muscle weakness (generalized): Secondary | ICD-10-CM | POA: Diagnosis not present

## 2018-07-13 DIAGNOSIS — R262 Difficulty in walking, not elsewhere classified: Secondary | ICD-10-CM

## 2018-07-13 DIAGNOSIS — M25551 Pain in right hip: Secondary | ICD-10-CM

## 2018-07-13 NOTE — Therapy (Signed)
[facility] [address] [place], [place], [address] Phone: [phone]   Fax:  [phone]  Physical Therapy Treatment  Patient Details  Name: Linda [name] FTDDUKGUR MRN: [id number] Date of Birth: 07-29-1932 Referring Provider: Martinique, Betty G, MD   Encounter Date: 07/13/2018  PT End of Session - 07/13/18 1506    Visit Number  3    Date for PT Re-Evaluation  08/31/18    PT Start Time  1448    PT Stop Time  1543    PT Time Calculation (min)  55 min    Activity Tolerance  Patient tolerated treatment well    Behavior During Therapy  [facility] for tasks assessed/performed       Past Medical History:  Diagnosis Date  . Allergy   . Anxiety   . Arthritis   . Cancer [facility])    UTERINE CANCER  . GERD (gastroesophageal reflux disease)   . History of blood transfusion   . Hyperlipidemia   . Hypertension   . Insomnia   . Osteopenia    MILD  . Urine retention   . Uterine cancer ([place])    at age 84    Past Surgical History:  Procedure Laterality Date  . ABDOMINAL HYSTERECTOMY     AGE 84  . APPENDECTOMY     AGE 84  . BLADDER SURGERY    . Catarct Surgery    . LAPAROSCOPIC SMALL BOWEL RESECTION    . NM PET DX LYMPHOMA  10/2018   IN REMISSION  . occular pressure    . OTHER SURGICAL HISTORY    . REPLACEMENT TOTAL KNEE BILATERAL    . TONSILLECTOMY AND ADENOIDECTOMY    . UTERINE CANCER SURGERY      There were no vitals filed for this visit.  Subjective Assessment - 07/13/18 1458    Subjective  I had a lot more difficulty after getting off the prednisone and I had withdrawal symptoms.    Patient Stated Goals  get rid of hip pain    Currently in Pain?  Yes    Pain Score  2     Pain Location  Hip    Pain Orientation  Right    Pain Descriptors / Indicators  Sharp    Pain Type  Acute pain    Pain Onset  1 to 4 weeks ago    Pain Frequency  Intermittent    Aggravating Factors   standing from sitting, walking    Pain  Relieving Factors  sitting and standing is the same    Multiple Pain Sites  No                       [facility] Adult PT Treatment/Exercise - 07/13/18 0001      Neuro Re-ed    Neuro Re-ed Details   TrA activation      Knee/Hip Exercises: Seated   Other Seated Knee/Hip Exercises  core and TrA in sitting with press onto foam roll    Hamstring Curl  Strengthening;Right;Left;20 reps;Limitations    Hamstring Limitations  red band and cues to engage core      Knee/Hip Exercises: Supine   Heel Slides  Strengthening;Right;20 reps;Limitations    Heel Slides Limitations  small ROM    Hip Adduction Isometric  Strengthening;Right;5 reps;Limitations    Hip Adduction Isometric Limitations  hip add/abd with pain at end range of abduction so discontinued exercise    Other Supine Knee/Hip Exercises  hip abduction with red band bilat 20x; single leg 20x    Other Supine Knee/Hip Exercises  lumbar rotation to the left stretch to Rt IT band - 10x      Modalities   Modalities  Moist Heat;Electrical Stimulation      Moist Heat Therapy   Number Minutes Moist Heat  15 Minutes    Moist Heat Location  Hip      Electrical Stimulation   Electrical Stimulation Location  Rt hip    Electrical Stimulation Action  IFC    Electrical Stimulation Parameters  to tolerance 15 min    Electrical Stimulation Goals  Pain               PT Short Term Goals - 07/13/18 1513      PT SHORT TERM GOAL #1   Title  ind with initial HEP    Status  Achieved      PT SHORT TERM GOAL #2   Title  Pt able to walk with even stride using LRAD    Status  On-going        PT Long Term Goals - 07/06/18 1514      PT LONG TERM GOAL #1   Title  Pt will be able to walk to her aquatic class without AD    Time  8    Period  Weeks    Status  New    Target Date  08/31/18      PT LONG TERM GOAL #2   Title  Pt will be able to perform right hip abduction with 4+/5 MMT for improved gait    Time  8    Period   Weeks    Status  New    Target Date  08/31/18      PT LONG TERM GOAL #3   Title  pt will report 60% less pain during typical day    Time  8    Period  Weeks    Status  New    Target Date  08/31/18      PT LONG TERM GOAL #4   Title  FOTO < or = 46% limited for greater function    Time  8    Period  Weeks    Status  New    Target Date  08/31/18      PT LONG TERM GOAL #5   Title  Pt will complete 6 min walk test within age related norms    Time  8    Period  Weeks    Status  New    Target Date  08/31/18            Plan - 07/13/18 1508    Clinical Impression Statement  Pt did well with exercises.  Limited with hip flexion and abduction due to pain and weakness.  Pt reports abiltiy to do initial HEP correctly.  She responded well to stim and heat for pain management.  Pt will benefit from skilled PT to progress strength and ROM.    PT Treatment/Interventions  ADLs/Self Care Home Management;Biofeedback;Cryotherapy;Electrical Stimulation;Moist Heat;Passive range of motion;Dry needling;Taping;Manual techniques;Neuromuscular re-education;Therapeutic exercise;Therapeutic activities    PT Next Visit Plan  Nustep, lumbar and hip ROM, core and LE strength, 6MWT if tolerated, manual and estim as needed    Recommended Other Services  eval 07/06/18 signed orders completed    Consulted and Agree with Plan of Care  Patient       Patient will benefit from  skilled therapeutic intervention in order to improve the following deficits and impairments:  Abnormal gait, Pain, Increased muscle spasms, Decreased strength, Decreased range of motion, Difficulty walking  Visit Diagnosis: Pain in right hip  Muscle weakness (generalized)  Difficulty in walking, not elsewhere classified     Problem List Patient Active Problem List   Diagnosis Date Noted  . Hypertension, essential, benign 01/16/2018  . Sensorineural hearing loss (SNHL) of both ears 01/16/2018  . Insomnia 01/16/2018  . MALT  lymphoma ([place]) 01/15/2018  . 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 Osteopenia of both hips 01/15/2018  . Hyperlipidemia 01/15/2018    Zannie Cove, PT 07/13/2018, 3:40 PM  [facility] [address] [place], [place], [address] Phone: [phone]   Fax:  [phone]  Name: MAKALAH ASBERRY MRN: [id number] Date of Birth: 1932-04-08

## 2018-07-15 ENCOUNTER — Ambulatory Visit: Payer: Medicare Other | Attending: Family Medicine | Admitting: Physical Therapy

## 2018-07-15 ENCOUNTER — Encounter: Payer: Self-pay | Admitting: Physical Therapy

## 2018-07-15 DIAGNOSIS — M25551 Pain in right hip: Secondary | ICD-10-CM

## 2018-07-15 DIAGNOSIS — R262 Difficulty in walking, not elsewhere classified: Secondary | ICD-10-CM | POA: Diagnosis not present

## 2018-07-15 DIAGNOSIS — M6281 Muscle weakness (generalized): Secondary | ICD-10-CM | POA: Diagnosis not present

## 2018-07-15 NOTE — Therapy (Signed)
[facility] [address], [place], [place], [address] Phone: [phone]   Fax:  [phone]  Physical Therapy Treatment  Patient Details  Name: Linda [name] BTDVVOHYW MRN: [id number] Date of Birth: 1932-10-05 Referring Provider: Martinique, Betty G, MD   Encounter Date: 07/15/2018  PT End of Session - 07/15/18 1436    Visit Number  4    Date for PT Re-Evaluation  08/31/18    PT Start Time  1432    PT Stop Time  1533    PT Time Calculation (min)  61 min    Activity Tolerance  Patient tolerated treatment well    Behavior During Therapy  [facility] for tasks assessed/performed       Past Medical History:  Diagnosis Date  . Allergy   . Anxiety   . Arthritis   . Cancer [facility])    UTERINE CANCER  . GERD (gastroesophageal reflux disease)   . History of blood transfusion   . Hyperlipidemia   . Hypertension   . Insomnia   . Osteopenia    MILD  . Urine retention   . Uterine cancer ([place])    at age 42    Past Surgical History:  Procedure Laterality Date  . ABDOMINAL HYSTERECTOMY     AGE 47  . APPENDECTOMY     AGE 11  . BLADDER SURGERY    . Catarct Surgery    . LAPAROSCOPIC SMALL BOWEL RESECTION    . NM PET DX LYMPHOMA  10/2018   IN REMISSION  . occular pressure    . OTHER SURGICAL HISTORY    . REPLACEMENT TOTAL KNEE BILATERAL    . TONSILLECTOMY AND ADENOIDECTOMY    . UTERINE CANCER SURGERY      There were no vitals filed for this visit.  Subjective Assessment - 07/15/18 1439    Subjective  I will be able to use the pool tomorrow because I'll get transportation to bring me.  Pt denies pain currently    Pertinent History  bilat knee replacements, arthritis throughout    Patient Stated Goals  get rid of hip pain    Currently in Pain?  No/denies                       [facility] Adult PT Treatment/Exercise - 07/15/18 0001      Ambulation/Gait   Ambulation/Gait  Yes    Ambulation/Gait Assistance  6:  Modified independent (Device/Increase time)    Ambulation Distance (Feet)  50 Feet    Gait Comments  cues to walk heel to toe; PT unable to adjust height due to RW stuck in position; pt cued to avoid leaning to the right when putting weight through Rt leg      Neuro Re-ed    Neuro Re-ed Details   standing weight shift      Knee/Hip Exercises: Aerobic   Nustep  L1 x 9 min PT present to discus      Knee/Hip Exercises: Seated   Long Arc Quad  Strengthening;Right;Left;2 sets;10 reps;Weights    Long Arc Quad Weight  2 lbs.    Ball Squeeze  10 x 5 sec hold    Clamshell with TheraBand  Green 20x    Sit to [employer]  without UE support;10 reps elevated surface      Moist Heat Therapy   Number Minutes Moist Heat  15 Minutes    Moist Heat Location  Hip  Acupuncturist Location  Rt hip    Electrical Stimulation Action  IFC    Electrical Stimulation Parameters  to tolerance    Electrical Stimulation Goals  Pain      Manual Therapy   Soft tissue mobilization  RT lateral hip    Myofascial Release  RT lateral hip in LT sidelying               PT Short Term Goals - 07/13/18 1513      PT SHORT TERM GOAL #1   Title  ind with initial HEP    Status  Achieved      PT SHORT TERM GOAL #2   Title  Pt able to walk with even stride using LRAD    Status  On-going        PT Long Term Goals - 07/06/18 1514      PT LONG TERM GOAL #1   Title  Pt will be able to walk to her aquatic class without AD    Time  8    Period  Weeks    Status  New    Target Date  08/31/18      PT LONG TERM GOAL #2   Title  Pt will be able to perform right hip abduction with 4+/5 MMT for improved gait    Time  8    Period  Weeks    Status  New    Target Date  08/31/18      PT LONG TERM GOAL #3   Title  pt will report 60% less pain during typical day    Time  8    Period  Weeks    Status  New    Target Date  08/31/18      PT LONG TERM GOAL #4   Title  FOTO < or =  46% limited for greater function    Time  8    Period  Weeks    Status  New    Target Date  08/31/18      PT LONG TERM GOAL #5   Title  Pt will complete 6 min walk test within age related norms    Time  8    Period  Weeks    Status  New    Target Date  08/31/18            Plan - 07/15/18 1527    Clinical Impression Statement  Pt demonstrates improved gait with cues given . Pt tolerates exercises well and sit to stand from high surface improved after first 2 reps.  Pt had pain with trasititioning supine<>sit .  Pt will benefit form skilled PT to progress core and LE strength.      PT Treatment/Interventions  ADLs/Self Care Home Management;Biofeedback;Cryotherapy;Electrical Stimulation;Moist Heat;Passive range of motion;Dry needling;Taping;Manual techniques;Neuromuscular re-education;Therapeutic exercise;Therapeutic activities    PT Next Visit Plan  Nustep, lumbar and hip ROM, core and LE strength, 6MWT if tolerated, manual and estim as needed;     Consulted and Agree with Plan of Care  Patient       Patient will benefit from skilled therapeutic intervention in order to improve the following deficits and impairments:  Abnormal gait, Pain, Increased muscle spasms, Decreased strength, Decreased range of motion, Difficulty walking  Visit Diagnosis: Pain in right hip  Muscle weakness (generalized)  Difficulty in walking, not elsewhere classified     Problem List Patient Active Problem List   Diagnosis Date Noted  .  Hypertension, essential, benign 01/16/2018  . Sensorineural hearing loss (SNHL) of both ears 01/16/2018  . Insomnia 01/16/2018  . MALT lymphoma ([place]) 01/15/2018  . 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 Osteopenia of both hips 01/15/2018  . Hyperlipidemia 01/15/2018    Zannie Cove, PT 07/15/2018, 3:36 PM  [facility] [address] [place], [place], [address] Phone: [phone]   Fax:   [phone]  Name: Linda [name] MRN: [id number] Date of Birth: 04/17/1932

## 2018-07-20 ENCOUNTER — Ambulatory Visit: Payer: Medicare Other | Admitting: Physical Therapy

## 2018-07-20 ENCOUNTER — Encounter: Payer: Self-pay | Admitting: Physical Therapy

## 2018-07-20 DIAGNOSIS — M6281 Muscle weakness (generalized): Secondary | ICD-10-CM | POA: Diagnosis not present

## 2018-07-20 DIAGNOSIS — M25551 Pain in right hip: Secondary | ICD-10-CM | POA: Diagnosis not present

## 2018-07-20 DIAGNOSIS — R262 Difficulty in walking, not elsewhere classified: Secondary | ICD-10-CM

## 2018-07-20 NOTE — Patient Instructions (Signed)
Access Code: [email or web address]  URL: [email or web address]  Date: 07/20/2018  Prepared by: Lovett Calender   Exercises  Clamshell - 10 reps - 2 sets - 1x daily - 7x weekly  Supine 90/90 Isometric Hip Abduction - 10 reps - 1 sets - 5 hold - 2x daily - 7x weekly  Hooklying Single Knee to Chest Stretch - 5 reps - 3 sets - 20 hold - 3x daily - 7x weekly  Supine Lower Trunk Rotation - 5 reps - 2 sets - 20 hold - 3x daily - 7x weekly  Supine Figure 4 Piriformis Stretch - 3 reps - 1 sets - 30 sec hold - 1x daily - 7x weekly  Supine Transversus Abdominis Bracing with Heel Slide - 10 reps - 2 sets - 1x daily - 7x weekly  [facility] [address] [place], [place] [address] Phone # [phone] Fax [phone]

## 2018-07-20 NOTE — Therapy (Signed)
[facility] [address], [place], [place], [address] Phone: [phone]   Fax:  [phone]  Physical Therapy Treatment  Patient Details  Name: Linda [name] DPOEUMPNT MRN: [id number] Date of Birth: 10/15/1932 Referring Provider: Martinique, Betty G, MD   Encounter Date: 07/20/2018  PT End of Session - 07/20/18 1441    Visit Number  5    Date for PT Re-Evaluation  08/31/18    PT Start Time  0867    PT Stop Time  1536    PT Time Calculation (min)  55 min    Activity Tolerance  Patient tolerated treatment well    Behavior During Therapy  [facility] for tasks assessed/performed       Past Medical History:  Diagnosis Date  . Allergy   . Anxiety   . Arthritis   . Cancer [facility])    UTERINE CANCER  . GERD (gastroesophageal reflux disease)   . History of blood transfusion   . Hyperlipidemia   . Hypertension   . Insomnia   . Osteopenia    MILD  . Urine retention   . Uterine cancer ([place])    at age 27    Past Surgical History:  Procedure Laterality Date  . ABDOMINAL HYSTERECTOMY     AGE 58  . APPENDECTOMY     AGE 69  . BLADDER SURGERY    . Catarct Surgery    . LAPAROSCOPIC SMALL BOWEL RESECTION    . NM PET DX LYMPHOMA  10/2018   IN REMISSION  . occular pressure    . OTHER SURGICAL HISTORY    . REPLACEMENT TOTAL KNEE BILATERAL    . TONSILLECTOMY AND ADENOIDECTOMY    . UTERINE CANCER SURGERY      There were no vitals filed for this visit.  Subjective Assessment - 07/20/18 1445    Subjective  Pt presents to clnic using cane.  Pt went into the water to work out. States she is able to walk around the home without the cane but still needs help to stand up and it is still painful to stand and lift leg into bed    Pertinent History  bilat knee replacements, arthritis throughout    Patient Stated Goals  get rid of hip pain    Currently in Pain?  No/denies                       [facility] Adult PT  Treatment/Exercise - 07/20/18 0001      Lumbar Exercises: Standing   Heel Raises  20 reps heel to toe    Shoulder Extension  Strengthening;Both;20 reps;Theraband TrA    Theraband Level (Shoulder Extension)  Level 2 (Red)      Knee/Hip Exercises: Aerobic   Nustep  L1 x 10 min PT present to discuss progress      Knee/Hip Exercises: Standing   Hip Flexion  Stengthening;Right;Left;15 reps;Knee bent    Hip Extension  Stengthening;Right;Left;5 reps      Knee/Hip Exercises: Seated   Long Arc Quad  Strengthening;Right;Left;2 sets;10 reps;Weights    Long Arc Quad Weight  3 lbs.    Sit to Sand  10 reps;with UE support min UE from chair and foam mat; cues for even weight shift      Knee/Hip Exercises: Supine   Bridges  Strengthening;Both;10 reps      Moist Heat Therapy   Number Minutes Moist Heat  15 Minutes    Moist Heat Location  Hip      Electrical Stimulation   Electrical Stimulation Location  Rt hip    Electrical Stimulation Action  IFC    Electrical Stimulation Parameters  to tolerance    Electrical Stimulation Goals  Pain             PT Education - 07/20/18 1531    Education Details   Access Code: [email or web address]     Person(s) Educated  Patient    Methods  Explanation;Demonstration    Comprehension  Verbalized understanding;Returned demonstration       PT Short Term Goals - 07/13/18 1513      PT SHORT TERM GOAL #1   Title  ind with initial HEP    Status  Achieved      PT SHORT TERM GOAL #2   Title  Pt able to walk with even stride using LRAD    Status  On-going        PT Long Term Goals - 07/20/18 1453      PT LONG TERM GOAL #1   Title  Pt will be able to walk to her aquatic class without AD    Baseline  got into the pool    Status  On-going      PT LONG TERM GOAL #2   Title  Pt will be able to perform right hip abduction with 4+/5 MMT for improved gait    Status  On-going            Plan - 07/20/18 1450    Clinical Impression Statement  Pt  demonstrates improvements with ability to peform exercises.  she is able to use less restrictive AD during ambulation.  Pt continues to have difficulty with trasitions and weakness with hip flexion.  She needs verbal and tactile cues for improved posture.  She was able to increase resistance with exercises today.  PT will benefit from skilled PT to progress strength and posture for maximum functional outcomes.    PT Treatment/Interventions  ADLs/Self Care Home Management;Biofeedback;Cryotherapy;Electrical Stimulation;Moist Heat;Passive range of motion;Dry needling;Taping;Manual techniques;Neuromuscular re-education;Therapeutic exercise;Therapeutic activities    PT Next Visit Plan  Nustep, lumbar and hip ROM, core and LE strength, 6MWT if tolerated, manual and estim for pain management;     PT Home Exercise Plan   Access Code: [email or web address]     Consulted and Agree with Plan of Care  Patient       Patient will benefit from skilled therapeutic intervention in order to improve the following deficits and impairments:  Abnormal gait, Pain, Increased muscle spasms, Decreased strength, Decreased range of motion, Difficulty walking  Visit Diagnosis: Pain in right hip  Muscle weakness (generalized)  Difficulty in walking, not elsewhere classified     Problem List Patient Active Problem List   Diagnosis Date Noted  . Hypertension, essential, benign 01/16/2018  . Sensorineural hearing loss (SNHL) of both ears 01/16/2018  . Insomnia 01/16/2018  . MALT lymphoma ([place]) 01/15/2018  . 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 Osteopenia of both hips 01/15/2018  . Hyperlipidemia 01/15/2018    Zannie Cove, PT 07/20/2018, 5:05 PM  [facility] [address] [place], [place], [address] Phone: [phone]   Fax:  [phone]  Name: Linda [name] MRN: [id number] Date of Birth: 1932/01/03

## 2018-07-22 ENCOUNTER — Ambulatory Visit: Payer: Medicare Other | Admitting: Physical Therapy

## 2018-07-22 ENCOUNTER — Encounter: Payer: Self-pay | Admitting: Physical Therapy

## 2018-07-22 DIAGNOSIS — M25551 Pain in right hip: Secondary | ICD-10-CM

## 2018-07-22 DIAGNOSIS — M6281 Muscle weakness (generalized): Secondary | ICD-10-CM | POA: Diagnosis not present

## 2018-07-22 DIAGNOSIS — R262 Difficulty in walking, not elsewhere classified: Secondary | ICD-10-CM | POA: Diagnosis not present

## 2018-07-22 NOTE — Therapy (Signed)
[facility] [address] [place], [place], [address] Phone: [phone]   Fax:  [phone]  Physical Therapy Treatment  Patient Details  Name: Linda [name] [id number] MRN: [id number] Date of Birth: [birth year] Referring Provider: Martinique, Betty G, MD   Encounter Date: 07/22/2018  PT End of Session - 07/22/18 1432    Visit Number  6    Date for PT Re-Evaluation  08/31/18    PT Start Time  1433    PT Stop Time  1528    PT Time Calculation (min)  55 min    Activity Tolerance  Patient tolerated treatment well    Behavior During Therapy  [facility] for tasks assessed/performed       Past Medical History:  Diagnosis Date  . Allergy   . Anxiety   . Arthritis   . Cancer [facility])    UTERINE CANCER  . GERD (gastroesophageal reflux disease)   . History of blood transfusion   . Hyperlipidemia   . Hypertension   . Insomnia   . Osteopenia    MILD  . Urine retention   . Uterine cancer ([place])    at age 47    Past Surgical History:  Procedure Laterality Date  . ABDOMINAL HYSTERECTOMY     AGE 82  . APPENDECTOMY     AGE 42  . BLADDER SURGERY    . Catarct Surgery    . LAPAROSCOPIC SMALL BOWEL RESECTION    . NM PET DX LYMPHOMA  10/2018   IN REMISSION  . occular pressure    . OTHER SURGICAL HISTORY    . REPLACEMENT TOTAL KNEE BILATERAL    . TONSILLECTOMY AND ADENOIDECTOMY    . UTERINE CANCER SURGERY      There were no vitals filed for this visit.  Subjective Assessment - 07/22/18 1435    Subjective  Pt reports no pain today but feels slow.     Currently in Pain?  No/denies                       [facility] Adult PT Treatment/Exercise - 07/22/18 0001      Ambulation/Gait   Ambulation/Gait  Yes    Ambulation/Gait Assistance  7: Independent    Ambulation Distance (Feet)  100 Feet    Gait Comments  trunk leaning to the right improved with cues, small step length      Knee/Hip Exercises: Aerobic   Nustep  L1 x 6  min; L2 x 4 min   PT present to discuss progress     Knee/Hip Exercises: Standing   Hip Abduction  Stengthening;Right;Left;10 reps;Knee straight   squeeze glutes   Other Standing Knee Exercises  marching 10x at counter for strength and balance;       Knee/Hip Exercises: Seated   Long Arc Quad  Strengthening;Right;Left;2 sets;10 reps;Weights    Long Arc Quad Weight  3 lbs.    Sit to Sand  10 reps;with UE support   mat table and foam mat; cues for even weight shift     Moist Heat Therapy   Number Minutes Moist Heat  15 Minutes    Moist Heat Location  Hip      Electrical Stimulation   Electrical Stimulation Location  Rt hip    Electrical Stimulation Action  IFC    Electrical Stimulation Parameters  to tolerance    Electrical Stimulation Goals  Pain  PT Short Term Goals - 07/22/18 1708      PT SHORT TERM GOAL #2   Title  Pt able to walk with even stride using LRAD    Baseline  no AD and able to demonstrate even stride for short distance    Status  Achieved        PT Long Term Goals - 07/22/18 1709      PT LONG TERM GOAL #1   Title  Pt will be able to walk to her aquatic class without AD    Status  On-going      PT LONG TERM GOAL #2   Title  Pt will be able to perform right hip abduction with 4+/5 MMT for improved gait    Status  On-going      PT LONG TERM GOAL #3   Title  pt will report 60% less pain during typical day    Status  On-going      PT LONG TERM GOAL #4   Title  FOTO < or = 46% limited for greater function    Status  On-going      PT LONG TERM GOAL #5   Title  Pt will complete 6 min walk test within age related norms    Status  On-going            Plan - 07/22/18 1527    Clinical Impression Statement  Pt demonstrates improved gait and is using SPC less when walking.  Pt needed cues to engage muscles to keep more hip alignment.  Pt is able to demonstrate improved gait without cane but still shortened step length and slow  pace.  She will benefit from skilled PT to continue to work on hip strength, gait, and balance for return to maximum function.    PT Treatment/Interventions  ADLs/Self Care Home Management;Biofeedback;Cryotherapy;Electrical Stimulation;Moist Heat;Passive range of motion;Dry needling;Taping;Manual techniques;Neuromuscular re-education;Therapeutic exercise;Therapeutic activities    PT Next Visit Plan  balance and hip strength, start with nustep, estim and heat for pain management    PT Home Exercise Plan   Access Code: [email or web address]     Consulted and Agree with Plan of Care  Patient       Patient will benefit from skilled therapeutic intervention in order to improve the following deficits and impairments:  Abnormal gait, Pain, Increased muscle spasms, Decreased strength, Decreased range of motion, Difficulty walking  Visit Diagnosis: Pain in right hip  Muscle weakness (generalized)  Difficulty in walking, not elsewhere classified     Problem List Patient Active Problem List   Diagnosis Date Noted  . Hypertension, essential, benign 01/16/2018  . Sensorineural hearing loss (SNHL) of both ears 01/16/2018  . Insomnia 01/16/2018  . MALT lymphoma ([place]) 01/15/2018  . 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 Osteopenia of both hips 01/15/2018  . Hyperlipidemia 01/15/2018    Zannie Cove, PT 07/22/2018, 5:12 PM  [facility] [address] [place], [place], [address] Phone: [phone]   Fax:  [phone]  Name: TAKESHIA WENK MRN: [id number] Date of Birth: 1932-09-04

## 2018-07-27 ENCOUNTER — Ambulatory Visit: Payer: Medicare Other | Admitting: Physical Therapy

## 2018-07-27 ENCOUNTER — Encounter: Payer: Self-pay | Admitting: Physical Therapy

## 2018-07-27 DIAGNOSIS — M6281 Muscle weakness (generalized): Secondary | ICD-10-CM | POA: Diagnosis not present

## 2018-07-27 DIAGNOSIS — R262 Difficulty in walking, not elsewhere classified: Secondary | ICD-10-CM | POA: Diagnosis not present

## 2018-07-27 DIAGNOSIS — M25551 Pain in right hip: Secondary | ICD-10-CM | POA: Diagnosis not present

## 2018-07-27 NOTE — Patient Instructions (Signed)

## 2018-07-27 NOTE — Therapy (Signed)
[facility] [address], [place], [place], [address] Phone: [phone]   Fax:  [phone]  Physical Therapy Treatment  Patient Details  Name: Linda [name] [id number] MRN: [id number] Date of Birth: 03-07-1932 Referring Provider: Martinique, Betty G, MD   Encounter Date: 07/27/2018  PT End of Session - 07/27/18 1447    Visit Number  7    Date for PT Re-Evaluation  08/31/18    PT Start Time  1448    PT Stop Time  1539    PT Time Calculation (min)  52 min    Activity Tolerance  Patient tolerated treatment well    Behavior During Therapy  [facility] for tasks assessed/performed       Past Medical History:  Diagnosis Date  . Allergy   . Anxiety   . Arthritis   . Cancer [facility])    UTERINE CANCER  . GERD (gastroesophageal reflux disease)   . History of blood transfusion   . Hyperlipidemia   . Hypertension   . Insomnia   . Osteopenia    MILD  . Urine retention   . Uterine cancer ([place])    at age 84    Past Surgical History:  Procedure Laterality Date  . ABDOMINAL HYSTERECTOMY     AGE 84  . APPENDECTOMY     AGE 84  . BLADDER SURGERY    . Catarct Surgery    . LAPAROSCOPIC SMALL BOWEL RESECTION    . NM PET DX LYMPHOMA  10/2018   IN REMISSION  . occular pressure    . OTHER SURGICAL HISTORY    . REPLACEMENT TOTAL KNEE BILATERAL    . TONSILLECTOMY AND ADENOIDECTOMY    . UTERINE CANCER SURGERY      There were no vitals filed for this visit.  Subjective Assessment - 07/27/18 1450    Subjective  Pt feels like she did too much.  states she drove the car for the first time.  She went to get a pedicure and then walked around [employer].    Pertinent History  bilat knee replacements, arthritis throughout    Patient Stated Goals  get rid of hip pain    Currently in Pain?  Yes    Pain Score  5     Pain Location  Hip    Pain Descriptors / Indicators  Sore    Pain Type  Chronic pain    Pain Radiating Towards  front of right  thigh         [facility] PT Assessment - 07/27/18 0001      Standardized Balance Assessment   Standardized Balance Assessment  Timed Up and Go Test      Timed Up and Go Test   Normal TUG (seconds)  20   19 on second trial                  [facility] Adult PT Treatment/Exercise - 07/27/18 0001      Ambulation/Gait   Gait Comments  unsteady and antalgic on Rt side; improved with cues for heel strike and contract glutes      Knee/Hip Exercises: Standing   Hip Flexion  Stengthening;Right;Left;15 reps;Knee bent    Hip Abduction  Limitations    Abduction Limitations  2lb    Other Standing Knee Exercises  marching at counter add 2# weights, knee flexion 2# weights      Knee/Hip Exercises: Seated   Sit to Sand  5 reps;without UE support  from elevated surface; 4 sets     Moist Heat Therapy   Number Minutes Moist Heat  15 Minutes    Moist Heat Location  --   down from of right thigh     Electrical Stimulation   Electrical Stimulation Location  Rt anterior thigh    Electrical Stimulation Action  IFC    Electrical Stimulation Parameters  to tolerance    Electrical Stimulation Goals  Pain               PT Short Term Goals - 07/22/18 1708      PT SHORT TERM GOAL #2   Title  Pt able to walk with even stride using LRAD    Baseline  no AD and able to demonstrate even stride for short distance    Status  Achieved        PT Long Term Goals - 07/27/18 1635      PT LONG TERM GOAL #1   Title  Pt will be able to walk to her aquatic class without AD    Baseline  is using cane to walk around the pool, able to walk around gym today and at home safely without any AD    Status  On-going      PT LONG TERM GOAL #2   Title  Pt will be able to perform right hip abduction with 4+/5 MMT for improved gait    Status  On-going      PT LONG TERM GOAL #3   Title  pt will report 60% less pain during typical day    Status  On-going      PT LONG TERM GOAL #4   Title  FOTO < or  = 46% limited for greater function    Status  On-going      PT LONG TERM GOAL #5   Title  Pt will complete 6 min walk test within age related norms    Baseline  unable to walk 6 min without sitting    Status  On-going            Plan - 07/27/18 1526    Clinical Impression Statement  Pt demonstrates improved gait without cane today.  She completed TUG but needs UE assistance.  She still has difficulty with hip flexion, but able to perform standing with added weight today.  Pt is making progress.  Order sent to MD to add ionto for pain management while working on strength.  Pt will benefit from skilled PT to progress core and hip strength for improved gait and funcitonal activities.    PT Treatment/Interventions  ADLs/Self Care Home Management;Biofeedback;Cryotherapy;Electrical Stimulation;Moist Heat;Passive range of motion;Dry needling;Taping;Manual techniques;Neuromuscular re-education;Therapeutic exercise;Therapeutic activities;Iontophoresis 4mg /ml Dexamethasone    PT Next Visit Plan  ionto if order signed, balance and hip strength, start with nustep, estim and heat for pain management    PT Home Exercise Plan   Access Code: [email or web address]     Consulted and Agree with Plan of Care  Patient       Patient will benefit from skilled therapeutic intervention in order to improve the following deficits and impairments:  Abnormal gait, Pain, Increased muscle spasms, Decreased strength, Decreased range of motion, Difficulty walking  Visit Diagnosis: Pain in right hip - Plan: PT plan of care cert/re-cert  Muscle weakness (generalized) - Plan: PT plan of care cert/re-cert  Difficulty in walking, not elsewhere classified - Plan: PT plan of care cert/re-cert     Problem List Patient Active  Problem List   Diagnosis Date Noted  . Hypertension, essential, benign 01/16/2018  . Sensorineural hearing loss (SNHL) of both ears 01/16/2018  . Insomnia 01/16/2018  . MALT lymphoma ([place]) 01/15/2018  .  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 Osteopenia of both hips 01/15/2018  . Hyperlipidemia 01/15/2018    Zannie Cove, PT 07/27/2018, 4:49 PM  [facility] [address] [place], [place], [address] Phone: [phone]   Fax:  [phone]  Name: ROSITA GUZZETTA MRN: [id number] Date of Birth: 17-May-1932

## 2018-07-29 ENCOUNTER — Ambulatory Visit: Payer: Medicare Other | Admitting: Physical Therapy

## 2018-07-29 ENCOUNTER — Encounter: Payer: Self-pay | Admitting: Physical Therapy

## 2018-07-29 DIAGNOSIS — M6281 Muscle weakness (generalized): Secondary | ICD-10-CM | POA: Diagnosis not present

## 2018-07-29 DIAGNOSIS — M25551 Pain in right hip: Secondary | ICD-10-CM | POA: Diagnosis not present

## 2018-07-29 DIAGNOSIS — R262 Difficulty in walking, not elsewhere classified: Secondary | ICD-10-CM | POA: Diagnosis not present

## 2018-07-29 NOTE — Therapy (Signed)
[facility] [address] [place], [place], [address] Phone: [phone]   Fax:  [phone]  Physical Therapy Treatment  Patient Details  Name: Linda [name] [id number] MRN: [id number] Date of Birth: Jan 11, 1932 Referring Provider: Martinique, Betty G, MD   Encounter Date: 07/29/2018  PT End of Session - 07/29/18 1445    Visit Number  8    Date for PT Re-Evaluation  08/31/18    PT Start Time  1435    PT Stop Time  1530    PT Time Calculation (min)  55 min    Activity Tolerance  Patient tolerated treatment well    Behavior During Therapy  [facility] for tasks assessed/performed       Past Medical History:  Diagnosis Date  . Allergy   . Anxiety   . Arthritis   . Cancer [facility])    UTERINE CANCER  . GERD (gastroesophageal reflux disease)   . History of blood transfusion   . Hyperlipidemia   . Hypertension   . Insomnia   . Osteopenia    MILD  . Urine retention   . Uterine cancer ([place])    at age 38    Past Surgical History:  Procedure Laterality Date  . ABDOMINAL HYSTERECTOMY     AGE 51  . APPENDECTOMY     AGE 89  . BLADDER SURGERY    . Catarct Surgery    . LAPAROSCOPIC SMALL BOWEL RESECTION    . NM PET DX LYMPHOMA  10/2018   IN REMISSION  . occular pressure    . OTHER SURGICAL HISTORY    . REPLACEMENT TOTAL KNEE BILATERAL    . TONSILLECTOMY AND ADENOIDECTOMY    . UTERINE CANCER SURGERY      There were no vitals filed for this visit.  Subjective Assessment - 07/29/18 1440    Subjective  Pt has not been using the cane.  Tues and last night woke up with a lot of pain.  Currently she is feeling better.      Pertinent History  bilat knee replacements, arthritis throughout    Patient Stated Goals  get rid of hip pain    Currently in Pain?  Yes    Pain Score  3     Pain Location  Hip    Pain Orientation  Right    Pain Radiating Towards  front of thigh    Pain Onset  More than a month ago    Pain Frequency   Intermittent    Aggravating Factors   standing from sitting and walking    Multiple Pain Sites  No                       [facility] Adult PT Treatment/Exercise - 07/29/18 0001      Ambulation/Gait   Ambulation Distance (Feet)  340 Feet    Gait Pattern  Decreased stride length    Ambulation Surface  Level    Gait Comments  only became antalgic at the end of 2.5 min walk demonstrating fatigue in glutes on Rt side      Knee/Hip Exercises: Aerobic   Nustep  L1 x 10 min   PT present to discuss progress     Knee/Hip Exercises: Standing   Hip Flexion  --    Hip Abduction  --    Abduction Limitations  --    Other Standing Knee Exercises  --      Knee/Hip Exercises: Seated  Sit to Sand  5 reps;without UE support   from elevated surface; 3 sets; 1 set without sitting      Modalities   Modalities  Iontophoresis      Moist Heat Therapy   Number Minutes Moist Heat  15 Minutes    Moist Heat Location  Hip      Electrical Stimulation   Electrical Stimulation Location  Rt hip    Electrical Stimulation Action  IFC    Electrical Stimulation Parameters  to tolerance    Electrical Stimulation Goals  Pain      Iontophoresis   Type of Iontophoresis  Dexamethasone    Location  Rt greater trochanteric bursa    Dose  1.69mL    Time  4-6 hour release   #1     Manual Therapy   Soft tissue mobilization  RT lateral hip, glutes and lumbar paraspinals               PT Short Term Goals - 07/22/18 1708      PT SHORT TERM GOAL #2   Title  Pt able to walk with even stride using LRAD    Baseline  no AD and able to demonstrate even stride for short distance    Status  Achieved        PT Long Term Goals - 07/27/18 1635      PT LONG TERM GOAL #1   Title  Pt will be able to walk to her aquatic class without AD    Baseline  is using cane to walk around the pool, able to walk around gym today and at home safely without any AD    Status  On-going      PT LONG TERM GOAL #2    Title  Pt will be able to perform right hip abduction with 4+/5 MMT for improved gait    Status  On-going      PT LONG TERM GOAL #3   Title  pt will report 60% less pain during typical day    Status  On-going      PT LONG TERM GOAL #4   Title  FOTO < or = 46% limited for greater function    Status  On-going      PT LONG TERM GOAL #5   Title  Pt will complete 6 min walk test within age related norms    Baseline  unable to walk 6 min without sitting    Status  On-going            Plan - 07/29/18 1526    Clinical Impression Statement  Pt was able to walk 2.5 minutes without AD today and even gait without antalgia.  Pt demosntrates fatigue after this time and had some increased pain with onset of trendelenberg on the right.  Pt was given initial dose of ionto for inflammation.  Pt had release of soft tissue with manual treatment.  She will benefit from continueing with POC at this time.    PT Treatment/Interventions  ADLs/Self Care Home Management;Biofeedback;Cryotherapy;Electrical Stimulation;Moist Heat;Passive range of motion;Dry needling;Taping;Manual techniques;Neuromuscular re-education;Therapeutic exercise;Therapeutic activities;Iontophoresis 4mg /ml Dexamethasone    PT Next Visit Plan  f/u on ionto #1, endurance and glute strength, small step up, pain management as needed    PT Home Exercise Plan   Access Code: [email or web address]     Consulted and Agree with Plan of Care  Patient       Patient will benefit from skilled therapeutic intervention in order  to improve the following deficits and impairments:  Abnormal gait, Pain, Increased muscle spasms, Decreased strength, Decreased range of motion, Difficulty walking  Visit Diagnosis: Pain in right hip  Muscle weakness (generalized)  Difficulty in walking, not elsewhere classified     Problem List Patient Active Problem List   Diagnosis Date Noted  . Hypertension, essential, benign 01/16/2018  . Sensorineural hearing loss  (SNHL) of both ears 01/16/2018  . Insomnia 01/16/2018  . MALT lymphoma ([place]) 01/15/2018  . 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 Osteopenia of both hips 01/15/2018  . Hyperlipidemia 01/15/2018    Zannie Cove, PT 07/29/2018, 3:28 PM  [facility] [address] [place], [place], [address] Phone: [phone]   Fax:  [phone]  Name: Linda [name] MRN: [id number] Date of Birth: [birth year]

## 2018-08-03 ENCOUNTER — Encounter: Payer: Self-pay | Admitting: Physical Therapy

## 2018-08-03 ENCOUNTER — Ambulatory Visit: Payer: Medicare Other | Admitting: Physical Therapy

## 2018-08-03 DIAGNOSIS — M25551 Pain in right hip: Secondary | ICD-10-CM

## 2018-08-03 DIAGNOSIS — M6281 Muscle weakness (generalized): Secondary | ICD-10-CM | POA: Diagnosis not present

## 2018-08-03 DIAGNOSIS — R262 Difficulty in walking, not elsewhere classified: Secondary | ICD-10-CM | POA: Diagnosis not present

## 2018-08-03 NOTE — Therapy (Signed)
[facility] [address], [name], [place], [address] Phone: [phone]   Fax:  [phone]  Physical Therapy Treatment  Patient Details  Name: Linda [name] RSWNIOEVO MRN: [id number] Date of Birth: [birth year] Referring Provider: [place], Betty G, MD   Encounter Date: 08/03/2018  PT End of Session - 08/03/18 1448    Visit Number  9    Date for PT Re-Evaluation  08/31/18    PT Start Time  2993    PT Stop Time  7169    PT Time Calculation (min)  61 min    Activity Tolerance  Patient tolerated treatment well    Behavior During Therapy  [facility] for tasks assessed/performed       Past Medical History:  Diagnosis Date  . Allergy   . Anxiety   . Arthritis   . Cancer [facility])    UTERINE CANCER  . GERD (gastroesophageal reflux disease)   . History of blood transfusion   . Hyperlipidemia   . Hypertension   . Insomnia   . Osteopenia    MILD  . Urine retention   . Uterine cancer ([place])    at age 27    Past Surgical History:  Procedure Laterality Date  . ABDOMINAL HYSTERECTOMY     AGE 77  . APPENDECTOMY     AGE 14  . BLADDER SURGERY    . Catarct Surgery    . LAPAROSCOPIC SMALL BOWEL RESECTION    . NM PET DX LYMPHOMA  10/2018   IN REMISSION  . occular pressure    . OTHER SURGICAL HISTORY    . REPLACEMENT TOTAL KNEE BILATERAL    . TONSILLECTOMY AND ADENOIDECTOMY    . UTERINE CANCER SURGERY      There were no vitals filed for this visit.  Subjective Assessment - 08/03/18 1449    Subjective  Pt states she is more stiff and sore today.  Pt was doing a lot yesterday.    Patient Stated Goals  get rid of hip pain    Currently in Pain?  Yes    Pain Score  5     Pain Location  Hip    Pain Orientation  Right    Pain Descriptors / Indicators  Aching    Pain Type  Chronic pain    Pain Radiating Towards  front of thigh    Pain Onset  More than a month ago    Pain Frequency  Intermittent                        [facility] Adult PT Treatment/Exercise - 08/03/18 0001      Neuro Re-ed    Neuro Re-ed Details   tactile cues to engage abdominals with breathing   TrA and obliques     Knee/Hip Exercises: Aerobic   Nustep  L1 x 5 min; L2 x 2 min      Moist Heat Therapy   Number Minutes Moist Heat  10 Minutes    Moist Heat Location  Hip      Electrical Stimulation   Electrical Stimulation Location  Rt hip    Electrical Stimulation Action  IFC    Electrical Stimulation Parameters  to tolerance    Electrical Stimulation Goals  Pain      Manual Therapy   Soft tissue mobilization  glutes, TF, lumbar paraspinals - right side    Myofascial Release  abdominal massage with cupping to release scar tissue  adhesions               PT Short Term Goals - 07/22/18 1708      PT SHORT TERM GOAL #2   Title  Pt able to walk with even stride using LRAD    Baseline  no AD and able to demonstrate even stride for short distance    Status  Achieved        PT Long Term Goals - 07/27/18 1635      PT LONG TERM GOAL #1   Title  Pt will be able to walk to her aquatic class without AD    Baseline  is using cane to walk around the pool, able to walk around gym today and at home safely without any AD    Status  On-going      PT LONG TERM GOAL #2   Title  Pt will be able to perform right hip abduction with 4+/5 MMT for improved gait    Status  On-going      PT LONG TERM GOAL #3   Title  pt will report 60% less pain during typical day    Status  On-going      PT LONG TERM GOAL #4   Title  FOTO < or = 46% limited for greater function    Status  On-going      PT LONG TERM GOAL #5   Title  Pt will complete 6 min walk test within age related norms    Baseline  unable to walk 6 min without sitting    Status  On-going            Plan - 08/03/18 1542    Clinical Impression Statement  Pt walking without cane but leaning to the right with slight right side bend in  sitting and standing.  Pt had a lot of trigger points and muscle spasms in right glutes.  Pt has scar tissue adhesions in abdomen.  She responded well to manual treatment and was able to engage her TrA and obliques after manual treatment and tactile cues.  Pt will benefit from skilled PT for improved coordination of core and LE strength.     PT Treatment/Interventions  ADLs/Self Care Home Management;Biofeedback;Cryotherapy;Electrical Stimulation;Moist Heat;Passive range of motion;Dry needling;Taping;Manual techniques;Neuromuscular re-education;Therapeutic exercise;Therapeutic activities;Iontophoresis 4mg /ml Dexamethasone    PT Next Visit Plan  f/u on DN#1, endurance and glute strength, small step up, pain management as needed    PT Home Exercise Plan   Access Code: [email or web address]     Consulted and Agree with Plan of Care  Patient       Patient will benefit from skilled therapeutic intervention in order to improve the following deficits and impairments:  Abnormal gait, Pain, Increased muscle spasms, Decreased strength, Decreased range of motion, Difficulty walking  Visit Diagnosis: Pain in right hip  Muscle weakness (generalized)  Difficulty in walking, not elsewhere classified     Problem List Patient Active Problem List   Diagnosis Date Noted  . Hypertension, essential, benign 01/16/2018  . Sensorineural hearing loss (SNHL) of both ears 01/16/2018  . Insomnia 01/16/2018  . MALT lymphoma ([place]) 01/15/2018  . 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 Osteopenia of both hips 01/15/2018  . Hyperlipidemia 01/15/2018    Linda [name], PT 08/03/2018, 4:04 PM  [facility] [address], [place], [place], [address] Phone: [phone]   Fax:  [phone]  Name: Linda [name] MRN: [id number]  Date of Birth: [birth year]

## 2018-08-05 ENCOUNTER — Encounter: Payer: Self-pay | Admitting: Physical Therapy

## 2018-08-05 ENCOUNTER — Ambulatory Visit: Payer: Medicare Other | Admitting: Physical Therapy

## 2018-08-05 DIAGNOSIS — M25551 Pain in right hip: Secondary | ICD-10-CM

## 2018-08-05 DIAGNOSIS — R262 Difficulty in walking, not elsewhere classified: Secondary | ICD-10-CM

## 2018-08-05 DIAGNOSIS — M6281 Muscle weakness (generalized): Secondary | ICD-10-CM | POA: Diagnosis not present

## 2018-08-05 NOTE — Therapy (Signed)
[facility] [address] [place], [place], [address] Phone: [phone]   Fax:  [phone]  Physical Therapy Treatment Progress Note Reporting Period 07/06/18 to 08/05/18   See note below for Objective Data and Assessment of Progress/Goals.      Patient Details  Name: Linda [name] JSEGBTDVV MRN: [id number] Date of Birth: [birth year] Referring Provider: Martinique, Betty G, MD   Encounter Date: 08/05/2018  PT End of Session - 08/05/18 1448    Visit Number  10    Date for PT Re-Evaluation  08/31/18    PT Start Time  6269    PT Stop Time  1526    PT Time Calculation (min)  41 min    Activity Tolerance  Patient tolerated treatment well    Behavior During Therapy  [facility] for tasks assessed/performed       Past Medical History:  Diagnosis Date  . Allergy   . Anxiety   . Arthritis   . Cancer [facility])    UTERINE CANCER  . GERD (gastroesophageal reflux disease)   . History of blood transfusion   . Hyperlipidemia   . Hypertension   . Insomnia   . Osteopenia    MILD  . Urine retention   . Uterine cancer ([place])    at age 48    Past Surgical History:  Procedure Laterality Date  . ABDOMINAL HYSTERECTOMY     AGE 31  . APPENDECTOMY     AGE 40  . BLADDER SURGERY    . Catarct Surgery    . LAPAROSCOPIC SMALL BOWEL RESECTION    . NM PET DX LYMPHOMA  10/2018   IN REMISSION  . occular pressure    . OTHER SURGICAL HISTORY    . REPLACEMENT TOTAL KNEE BILATERAL    . TONSILLECTOMY AND ADENOIDECTOMY    . UTERINE CANCER SURGERY      There were no vitals filed for this visit.  Subjective Assessment - 08/05/18 1448    Subjective  Pt states she feels achy on right side of her body.  States she has been more achy since previous treatment.    Patient Stated Goals  get rid of hip pain    Currently in Pain?  Yes    Pain Score  5     Pain Location  Hip    Pain Orientation  Right    Pain Descriptors / Indicators  Aching;Tightness     Pain Type  Chronic pain    Pain Radiating Towards  front of thigh    Pain Onset  More than a month ago    Pain Frequency  Intermittent    Aggravating Factors   walking    Multiple Pain Sites  No         [facility] PT Assessment - 08/05/18 0001      6 minute walk test results    Aerobic Endurance Distance Walked  428    Endurance additional comments  3.5 min then sat down                   [facility] Adult PT Treatment/Exercise - 08/05/18 0001      Standardized Balance Assessment   Standardized Balance Assessment  Timed Up and Go Test      Timed Up and Go Test   TUG  Normal TUG    Normal TUG (seconds)  17   3 trails: 18, 17, 15     Knee/Hip Exercises: Aerobic   Nustep  L1 x 10 min   PT present for status      Knee/Hip Exercises: Seated   Sit to Sand  5 reps;with UE support   elevated surface     Knee/Hip Exercises: Supine   Other Supine Knee/Hip Exercises  clam with yellow band and TrA activation - 20x    Other Supine Knee/Hip Exercises  bent knee drop out with TrA 20x      Moist Heat Therapy   Number Minutes Moist Heat  10 Minutes    Moist Heat Location  Hip      Electrical Stimulation   Electrical Stimulation Location  Rt hip    Electrical Stimulation Action  IFC    Electrical Stimulation Parameters  to tolerance x 10 min    Electrical Stimulation Goals  Pain      Iontophoresis   Type of Iontophoresis  Dexamethasone    Location  Rt greater trochanteric bursa    Dose  1.14m    Time  4-6 hour release   #2              PT Short Term Goals - 07/22/18 1708      PT SHORT TERM GOAL #2   Title  Pt able to walk with even stride using LRAD    Baseline  no AD and able to demonstrate even stride for short distance    Status  Achieved        PT Long Term Goals - 08/05/18 1451      PT LONG TERM GOAL #1   Title  Pt will be able to walk to her aquatic class without AD    Baseline  walked to pool 1x, but not consisten; she is walking without AD     Time  8    Period  Weeks    Status  On-going      PT LONG TERM GOAL #2   Title  Pt will be able to perform right hip abduction with 4+/5 MMT for improved gait    Baseline  4/5    Status  On-going      PT LONG TERM GOAL #3   Title  pt will report 60% less pain during typical day    Baseline  75% better, able to walk without a cane    Time  8    Period  Weeks    Status  Achieved      PT LONG TERM GOAL #4   Title  FOTO < or = 46% limited for greater function    Baseline  61% limited (08/05/18)    Status  On-going      PT LONG TERM GOAL #5   Title  Pt will complete 6 min walk test within age related norms    Baseline  can walk 366m 23 sec; 428 feet    Time  8    Period  Weeks    Status  On-going            Plan - 08/05/18 1513    Clinical Impression Statement  Pt met goal for reduced pain and she is now walking without AD.  She is able to walk further and making steady progress toward functional goals.  All goals were updated as shown above.  Pt was more fatigued today and reported some dizziness with supine to sit transitions.  Pt will benefit from skilled PT to continue to progress strength with modalities for pain management.     PT Treatment/Interventions  ADLs/Self Care Home Management;Biofeedback;Cryotherapy;Electrical Stimulation;Moist Heat;Passive range of motion;Dry needling;Taping;Manual techniques;Neuromuscular re-education;Therapeutic exercise;Therapeutic activities;Iontophoresis 29m/ml Dexamethasone    PT Next Visit Plan  abdominal massage, endurance and glute strength, small step up, pain management as needed, f/u on ionto     PT Home Exercise Plan   Access Code: [email or web address]    Recommended Other Services  PN 08/05/18; order signed    Consulted and Agree with Plan of Care  Patient       Patient will benefit from skilled therapeutic intervention in order to improve the following deficits and impairments:  Abnormal gait, Pain, Increased muscle spasms, Decreased  strength, Decreased range of motion, Difficulty walking  Visit Diagnosis: Pain in right hip  Muscle weakness (generalized)  Difficulty in walking, not elsewhere classified     Problem List Patient Active Problem List   Diagnosis Date Noted  . Hypertension, essential, benign 01/16/2018  . Sensorineural hearing loss (SNHL) of both ears 01/16/2018  . Insomnia 01/16/2018  . MALT lymphoma ([place] 01/15/2018  . H/O Cancer of vaginal vault ([place] 01/15/2018  . Osteopenia of both hips 01/15/2018  . Hyperlipidemia 01/15/2018    JWayne Both8/22/2019, 5:17 PM  [facility] [address] [place] [address]Phone: [phone]  Fax:  [phone] Name: JMAHATI VAJDAMRN: [id number] of Birth: [birth year]

## 2018-08-10 ENCOUNTER — Encounter: Payer: Self-pay | Admitting: Physical Therapy

## 2018-08-10 ENCOUNTER — Ambulatory Visit: Payer: Medicare Other | Admitting: Physical Therapy

## 2018-08-10 DIAGNOSIS — M6281 Muscle weakness (generalized): Secondary | ICD-10-CM

## 2018-08-10 DIAGNOSIS — M25551 Pain in right hip: Secondary | ICD-10-CM | POA: Diagnosis not present

## 2018-08-10 DIAGNOSIS — R262 Difficulty in walking, not elsewhere classified: Secondary | ICD-10-CM | POA: Diagnosis not present

## 2018-08-10 NOTE — Therapy (Signed)
[facility] [address] [place] , [place], [address] Phone: [phone]   Fax:  [phone]  Physical Therapy Treatment  Patient Details  Name: Linda [name] SWFUXNATF MRN: [id number] Date of Birth: [birth year] Referring Provider: Martinique, Betty G, MD   Encounter Date: 08/10/2018  PT End of Session - 08/10/18 1425    Visit Number  11    Date for PT Re-Evaluation  08/31/18    PT Start Time  2706    PT Stop Time  1526    PT Time Calculation (min)  66 min    Activity Tolerance  Patient tolerated treatment well    Behavior During Therapy  [facility] for tasks assessed/performed       Past Medical History:  Diagnosis Date  . Allergy   . Anxiety   . Arthritis   . Cancer [facility])    UTERINE CANCER  . GERD (gastroesophageal reflux disease)   . History of blood transfusion   . Hyperlipidemia   . Hypertension   . Insomnia   . Osteopenia    MILD  . Urine retention   . Uterine cancer ([place])    at age 44    Past Surgical History:  Procedure Laterality Date  . ABDOMINAL HYSTERECTOMY     AGE 35  . APPENDECTOMY     AGE 76  . BLADDER SURGERY    . Catarct Surgery    . LAPAROSCOPIC SMALL BOWEL RESECTION    . NM PET DX LYMPHOMA  10/2018   IN REMISSION  . occular pressure    . OTHER SURGICAL HISTORY    . REPLACEMENT TOTAL KNEE BILATERAL    . TONSILLECTOMY AND ADENOIDECTOMY    . UTERINE CANCER SURGERY      There were no vitals filed for this visit.  Subjective Assessment - 08/10/18 1432    Subjective  Pt states she was able to walk all the way to the aquatic center and back without stopping.  She reports she had no pain all weekend, but then pain returned on Monday.    Patient Stated Goals  get rid of hip pain    Currently in Pain?  Yes    Pain Score  5     Pain Orientation  Right    Pain Descriptors / Indicators  Aching;Tightness    Pain Radiating Towards  front of thigh and in glute on Rt side    Pain Frequency   Intermittent    Aggravating Factors   walking and exercises, sitting to stand    Multiple Pain Sites  No                       [facility] Adult PT Treatment/Exercise - 08/10/18 0001      Exercises   Exercises  Knee/Hip      Knee/Hip Exercises: Aerobic   Nustep  L1 x 5; L2 x 5 min   seat 8; PT present for status      Knee/Hip Exercises: Standing   Hip Abduction  Stengthening;Right;Left;5 reps    Hip Extension  Stengthening;Right;Left;5 reps    Other Standing Knee Exercises  lateral shift to the Rt side against the wall - 10x      Knee/Hip Exercises: Seated   Other Seated Knee/Hip Exercises  sidebend and thoracic rotation - 5 x each side    Marching  Strengthening;Right;Left;10 reps   with TrA activated     Moist Heat Therapy   Number Minutes  Moist Heat  15 Minutes    Moist Heat Location  Hip      Electrical Stimulation   Electrical Stimulation Location  Rt hip    Electrical Stimulation Action  IFC    Electrical Stimulation Parameters  to tolerance x 15 min    Electrical Stimulation Goals  Pain      Iontophoresis   Type of Iontophoresis  Dexamethasone    Location  Rt greater trochanteric bursa    Dose  1.54m    Time  4-6 hour release      Manual Therapy   Soft tissue mobilization  glutes, TF, lumbar paraspinals - right side               PT Short Term Goals - 07/22/18 1708      PT SHORT TERM GOAL #2   Title  Pt able to walk with even stride using LRAD    Baseline  no AD and able to demonstrate even stride for short distance    Status  Achieved        PT Long Term Goals - 08/10/18 1426      PT LONG TERM GOAL #1   Title  Pt will be able to walk to her aquatic class without AD    Baseline  walk to and from class without stopping and no AD on Friday, but not sure if she can do it every time    Status  Partially Met      PT LONG TERM GOAL #2   Title  Pt will be able to perform right hip abduction with 4+/5 MMT for improved gait    Status   On-going      PT LONG TERM GOAL #4   Title  FOTO < or = 46% limited for greater function    Status  On-going      PT LONG TERM GOAL #5   Title  Pt will complete 6 min walk test within age related norms    Status  On-going            Plan - 08/10/18 1519    Clinical Impression Statement  Pt responded well to treatment previous visit so ionto was used again for inflammation.  She was able to do the most walking she has been able to do since her hip pain began.  Pt demonstrates increased shift left during gait so performed lateral shift to the right.  No increased symptoms during this exercises.  Pt continues to benefit from skilled PT to continue with POC    PT Treatment/Interventions  ADLs/Self Care Home Management;Biofeedback;Cryotherapy;Electrical Stimulation;Moist Heat;Passive range of motion;Dry needling;Taping;Manual techniques;Neuromuscular re-education;Therapeutic exercise;Therapeutic activities;Iontophoresis '4mg'$ /ml Dexamethasone    PT Next Visit Plan  ionto, core and LE strengthening, f/u on adding shift to the right in standing    PT Home Exercise Plan   Access Code: [email or web address]    Consulted and Agree with Plan of Care  Patient       Patient will benefit from skilled therapeutic intervention in order to improve the following deficits and impairments:  Abnormal gait, Pain, Increased muscle spasms, Decreased strength, Decreased range of motion, Difficulty walking  Visit Diagnosis: Pain in right hip  Muscle weakness (generalized)  Difficulty in walking, not elsewhere classified     Problem List Patient Active Problem List   Diagnosis Date Noted  . Hypertension, essential, benign 01/16/2018  . Sensorineural hearing loss (SNHL) of both ears 01/16/2018  . Insomnia 01/16/2018  . MALT  lymphoma ([place]) 01/15/2018  . H/O Cancer of vaginal vault ([place]) 01/15/2018  . Osteopenia of both hips 01/15/2018  . Hyperlipidemia 01/15/2018    Zannie Cove, PT 08/10/2018, 5:12  PM  [facility] [address], [place], [place], [address] Phone: [phone]   Fax:  [phone]  Name: Linda [name] MRN: [id number] Date of Birth: 1931-12-30

## 2018-08-12 ENCOUNTER — Encounter: Payer: Self-pay | Admitting: Physical Therapy

## 2018-08-12 ENCOUNTER — Ambulatory Visit: Payer: Medicare Other | Admitting: Physical Therapy

## 2018-08-12 DIAGNOSIS — M25551 Pain in right hip: Secondary | ICD-10-CM | POA: Diagnosis not present

## 2018-08-12 DIAGNOSIS — M6281 Muscle weakness (generalized): Secondary | ICD-10-CM | POA: Diagnosis not present

## 2018-08-12 DIAGNOSIS — R262 Difficulty in walking, not elsewhere classified: Secondary | ICD-10-CM

## 2018-08-12 NOTE — Therapy (Signed)
[facility] [address], [address] , [place], [address] Phone: [phone]   Fax:  [phone]  Physical Therapy Treatment  Patient Details  Name: Linda [name] MRN: [id number] Date of Birth: 09/01/1932 Referring Provider: Jordan, Betty G, MD   Encounter Date: 08/12/2018  PT End of Session - 08/12/18 1528    Visit Number  12    Date for PT Re-Evaluation  08/31/18    PT Start Time  1446    PT Stop Time  1527    PT Time Calculation (min)  41 min    Activity Tolerance  Patient tolerated treatment well;No increased pain    Behavior During Therapy  WFL for tasks assessed/performed       Past Medical History:  Diagnosis Date  . Allergy   . Anxiety   . Arthritis   . Cancer ([facility])    UTERINE CANCER  . GERD (gastroesophageal reflux disease)   . History of blood transfusion   . Hyperlipidemia   . Hypertension   . Insomnia   . Osteopenia    MILD  . Urine retention   . Uterine cancer ([facility])    at age 64    Past Surgical History:  Procedure Laterality Date  . ABDOMINAL HYSTERECTOMY     AGE 40  . APPENDECTOMY     AGE 21  . BLADDER SURGERY    . Catarct Surgery    . LAPAROSCOPIC SMALL BOWEL RESECTION    . NM PET DX LYMPHOMA  10/2018   IN REMISSION  . occular pressure    . OTHER SURGICAL HISTORY    . REPLACEMENT TOTAL KNEE BILATERAL    . TONSILLECTOMY AND ADENOIDECTOMY    . UTERINE CANCER SURGERY      There were no vitals filed for this visit.  Subjective Assessment - 08/12/18 1448    Subjective  Pt states that today her RLE is really bothering her. She was having increased pain yesterday when she woke up and had difficulty walking to the aquatic center.     Patient Stated Goals  get rid of hip pain    Currently in Pain?  Yes    Pain Score  8    3/10 end of session   Pain Location  Hip    Pain Orientation  Right;Anterior    Pain Descriptors / Indicators  Aching;Tightness    Pain Type  Chronic pain    Pain Radiating Towards  front of thigh     Pain Onset  More than a month ago    Pain Frequency  Intermittent    Aggravating Factors   walking, any standing activity     Pain Relieving Factors  unsure                       [facility] Adult PT Treatment/Exercise - 08/12/18 0001      Self-Care   Other Self-Care Comments   Self quadriceps massage with roller/ball       Knee/Hip Exercises: Aerobic   Nustep  L1 x5 man, L2 x5 min to promote LE movement and endurance       Manual Therapy   Manual Therapy  Passive ROM    Soft tissue mobilization  STM Rt quadriceps, adductors    Myofascial Release  trigger point release Rt quad    Passive ROM  Rt quad stretch in sidelying x30 sec, contract/relax stretch                PT Education - 08/12/18 1520    Education Details  self rolling at home     Person(s) Educated  Patient    Methods  Explanation;Verbal cues;Demonstration    Comprehension  Verbalized understanding;Returned demonstration       PT Short Term Goals - 07/22/18 1708      PT SHORT TERM GOAL #2   Title  Pt able to walk with even stride using LRAD    Baseline  no AD and able to demonstrate even stride for short distance    Status  Achieved        PT Long Term Goals - 08/10/18 1426      PT LONG TERM GOAL #1   Title  Pt will be able to walk to her aquatic class without AD    Baseline  walk to and from class without stopping and no AD on Friday, but not sure if she can do it every time    Status  Partially Met      PT LONG TERM GOAL #2   Title  Pt will be able to perform right hip abduction with 4+/5 MMT for improved gait    Status  On-going      PT LONG TERM GOAL #4   Title  FOTO < or = 46% limited for greater function    Status  On-going      PT LONG TERM GOAL #5   Title  Pt will complete 6 min walk test within age related norms    Status  On-going            Plan - 08/12/18 1529    Clinical Impression Statement  Pt arrived with high  levels of Rt anterior thigh pain. Session focused heavily on Nustep to encourage LE movement and manual treatment and soft tissue mobilization to the quadriceps. Pt has significant muscle spasm and trigger points in the quadriceps and adductors. Therapist instructed pt in self massage for home and she demonstrated good understanding of this. Ended session with decrease in pain from 8/10 down to 3/10.     PT Treatment/Interventions  ADLs/Self Care Home Management;Biofeedback;Cryotherapy;Electrical Stimulation;Moist Heat;Passive range of motion;Dry needling;Taping;Manual techniques;Neuromuscular re-education;Therapeutic exercise;Therapeutic activities;Iontophoresis 74m/ml Dexamethasone    PT Next Visit Plan  pt unsure if she wants Ionto (thinks she had flare up from it), STM Rt quad; core and LE strengthening, f/u on adding shift to the right in standing    PT Home Exercise Plan   Access Code: [email or web address]    Consulted and Agree with Plan of Care  Patient       Patient will benefit from skilled therapeutic intervention in order to improve the following deficits and impairments:  Abnormal gait, Pain, Increased muscle spasms, Decreased strength, Decreased range of motion, Difficulty walking  Visit Diagnosis: Pain in right hip  Muscle weakness (generalized)  Difficulty in walking, not elsewhere classified     Problem List Patient Active Problem List   Diagnosis Date Noted  . Hypertension, essential, benign 01/16/2018  . Sensorineural hearing loss (SNHL) of both ears 01/16/2018  . Insomnia 01/16/2018  . MALT lymphoma ([place] 01/15/2018  . H/O Cancer of vaginal vault ([place] 01/15/2018  . Osteopenia of both hips 01/15/2018  . Hyperlipidemia 01/15/2018    3:36 PM,08/12/18 SSherol DadePT, DPT CSmyrnaat BSt. [facility] [address] [place] [address]Phone: [phone]   Fax:  [phone]  Name: Lynzee Lindquist WUXLKGMWN MRN: [id number] Date of Birth: 07-Sep-1932

## 2018-08-17 ENCOUNTER — Encounter: Payer: Self-pay | Admitting: Physical Therapy

## 2018-08-17 ENCOUNTER — Ambulatory Visit: Payer: Medicare Other | Attending: Family Medicine | Admitting: Physical Therapy

## 2018-08-17 DIAGNOSIS — R262 Difficulty in walking, not elsewhere classified: Secondary | ICD-10-CM | POA: Diagnosis not present

## 2018-08-17 DIAGNOSIS — M6281 Muscle weakness (generalized): Secondary | ICD-10-CM | POA: Diagnosis not present

## 2018-08-17 DIAGNOSIS — M25551 Pain in right hip: Secondary | ICD-10-CM | POA: Diagnosis not present

## 2018-08-17 NOTE — Therapy (Signed)
[facility] [address], [place], [place], [address] Phone: [phone]   Fax:  [phone]  Physical Therapy Treatment  Patient Details  Name: Linda [name] MRN: [id number] Date of Birth: 1932/07/22 Referring Provider: Martinique, Betty G, MD   Encounter Date: 08/17/2018  PT End of Session - 08/17/18 1457    Visit Number  13    Date for PT Re-Evaluation  08/31/18    PT Start Time  5638    PT Stop Time  7564    PT Time Calculation (min)  40 min    Activity Tolerance  Patient tolerated treatment well;No increased pain    Behavior During Therapy  WFL for tasks assessed/performed       Past Medical History:  Diagnosis Date  . Allergy   . Anxiety   . Arthritis   . Cancer [facility])    UTERINE CANCER  . GERD (gastroesophageal reflux disease)   . History of blood transfusion   . Hyperlipidemia   . Hypertension   . Insomnia   . Osteopenia    MILD  . Urine retention   . Uterine cancer ([place])    at age 84    Past Surgical History:  Procedure Laterality Date  . ABDOMINAL HYSTERECTOMY     AGE 84  . APPENDECTOMY     AGE 84  . BLADDER SURGERY    . Catarct Surgery    . LAPAROSCOPIC SMALL BOWEL RESECTION    . NM PET DX LYMPHOMA  10/2018   IN REMISSION  . occular pressure    . OTHER SURGICAL HISTORY    . REPLACEMENT TOTAL KNEE BILATERAL    . TONSILLECTOMY AND ADENOIDECTOMY    . UTERINE CANCER SURGERY      There were no vitals filed for this visit.  Subjective Assessment - 08/17/18 1508    Subjective  Pt states the pain on the side of her leg is gone, but now having pain in the front of the leg.  The massage helped    Patient Stated Goals  get rid of hip pain    Pain Score  4     Pain Location  Leg    Pain Orientation  Right;Upper;Anterior    Pain Descriptors / Indicators  Aching;Tightness    Pain Type  Chronic pain    Pain Onset  More than a month ago    Multiple Pain Sites  No                        [facility] Adult PT Treatment/Exercise - 08/17/18 0001      Exercises   Exercises  Knee/Hip;Lumbar      Lumbar Exercises: Supine   Bent Knee Raise  20 reps    Bridge  15 reps    Other Supine Lumbar Exercises  lower trunk rotatoin 10x       Knee/Hip Exercises: Aerobic   Nustep  --      Knee/Hip Exercises: Supine   Short Arc Quad Sets  Strengthening;Right;15 reps   2lb     Manual Therapy   Soft tissue mobilization  STM Rt quadriceps, adductors    Myofascial Release  trigger point release Rt quad             PT Education - 08/17/18 1527    Education Details   Access Code: [email or web address]     Person(s) Educated  Patient    Methods  Explanation;Demonstration;Verbal cues;Handout  Comprehension  Verbalized understanding;Returned demonstration       PT Short Term Goals - 07/22/18 1708      PT SHORT TERM GOAL #2   Title  Pt able to walk with even stride using LRAD    Baseline  no AD and able to demonstrate even stride for short distance    Status  Achieved        PT Long Term Goals - 08/10/18 1426      PT LONG TERM GOAL #1   Title  Pt will be able to walk to her aquatic class without AD    Baseline  walk to and from class without stopping and no AD on Friday, but not sure if she can do it every time    Status  Partially Met      PT LONG TERM GOAL #2   Title  Pt will be able to perform right hip abduction with 4+/5 MMT for improved gait    Status  On-going      PT LONG TERM GOAL #4   Title  FOTO < or = 46% limited for greater function    Status  On-going      PT LONG TERM GOAL #5   Title  Pt will complete 6 min walk test within age related norms    Status  On-going            Plan - 08/17/18 1527    Clinical Impression Statement  Pt was doing much better today and has had less pain with quad soft tissue work and self massage.  Pt was able to perform exercises with difficutly but no increased pain.  Pt was educated on adding  exercises to HEP.  She continues to have muscle spasm in quad. PT will benefit from skilled PT to progress strength and improve soft tissue length in order to return to maximum funcitonal activites and mobility.    PT Treatment/Interventions  ADLs/Self Care Home Management;Biofeedback;Cryotherapy;Electrical Stimulation;Moist Heat;Passive range of motion;Dry needling;Taping;Manual techniques;Neuromuscular re-education;Therapeutic exercise;Therapeutic activities;Iontophoresis 67m/ml Dexamethasone    PT Next Visit Plan  TUG, hip flexor and quad strength, balance, STM to quad as needed    PT Home Exercise Plan   Access Code: [email or web address]    Consulted and Agree with Plan of Care  Patient       Patient will benefit from skilled therapeutic intervention in order to improve the following deficits and impairments:  Abnormal gait, Pain, Increased muscle spasms, Decreased strength, Decreased range of motion, Difficulty walking  Visit Diagnosis: Pain in right hip  Muscle weakness (generalized)  Difficulty in walking, not elsewhere classified     Problem List Patient Active Problem List   Diagnosis Date Noted  . Hypertension, essential, benign 01/16/2018  . Sensorineural hearing loss (SNHL) of both ears 01/16/2018  . Insomnia 01/16/2018  . MALT lymphoma ([place] 01/15/2018  . H/O Cancer of vaginal vault ([place] 01/15/2018  . Osteopenia of both hips 01/15/2018  . Hyperlipidemia 01/15/2018    [name] PT 08/17/2018, 5:39 PM  [facility] [address] [place] [address]Phone: [phone]  Fax:  [phone] Name: JJUSTEEN HEHRMRN: [id number] of Birth: 8September 05, 1933

## 2018-08-17 NOTE — Patient Instructions (Signed)
Access Code: [email or web address]  URL: [email or web address]  Date: 08/17/2018  Prepared by: Lovett Calender   Exercises  Clamshell - 10 reps - 2 sets - 1x daily - 7x weekly  Supine 90/90 Isometric Hip Abduction - 10 reps - 1 sets - 5 hold - 2x daily - 7x weekly  Hooklying Single Knee to Chest Stretch - 5 reps - 3 sets - 20 hold - 3x daily - 7x weekly  Supine Lower Trunk Rotation - 5 reps - 2 sets - 20 hold - 3x daily - 7x weekly  Supine Figure 4 Piriformis Stretch - 3 reps - 1 sets - 30 sec hold - 1x daily - 7x weekly  Supine Transversus Abdominis Bracing with Heel Slide - 10 reps - 2 sets - 1x daily - 7x weekly  Standing March with Unilateral Counter Support - 10 reps - 3 sets - 1x daily - 7x weekly  Supine Bridge - 10 reps - 3 sets - 1x daily - 7x weekly  Supine March - 10 reps - 3 sets - 1x daily - 7x weekly  Seated Long Arc Quad - 10 reps - 3 sets - 1x daily - 7x weekly

## 2018-08-19 ENCOUNTER — Encounter

## 2018-08-20 ENCOUNTER — Ambulatory Visit: Payer: Medicare Other | Admitting: Physical Therapy

## 2018-08-20 DIAGNOSIS — M6281 Muscle weakness (generalized): Secondary | ICD-10-CM

## 2018-08-20 DIAGNOSIS — M25551 Pain in right hip: Secondary | ICD-10-CM

## 2018-08-20 DIAGNOSIS — R262 Difficulty in walking, not elsewhere classified: Secondary | ICD-10-CM | POA: Diagnosis not present

## 2018-08-20 NOTE — Therapy (Signed)
[facility] [address] [place], [place], [address] Phone: [phone]   Fax:  [phone]  Physical Therapy Treatment  Patient Details  Name: Linda [name] MRN: [id number] Date of Birth: 06/20/1932 Referring Provider: Martinique, Betty G, MD   Encounter Date: 08/20/2018  PT End of Session - 08/20/18 0935    Visit Number  14    Date for PT Re-Evaluation  08/31/18    PT Start Time  0931    PT Stop Time  1022    PT Time Calculation (min)  51 min    Activity Tolerance  Patient tolerated treatment well;No increased pain    Behavior During Therapy  WFL for tasks assessed/performed       Past Medical History:  Diagnosis Date  . Allergy   . Anxiety   . Arthritis   . Cancer [facility])    UTERINE CANCER  . GERD (gastroesophageal reflux disease)   . History of blood transfusion   . Hyperlipidemia   . Hypertension   . Insomnia   . Osteopenia    MILD  . Urine retention   . Uterine cancer ([place])    at age 54    Past Surgical History:  Procedure Laterality Date  . ABDOMINAL HYSTERECTOMY     AGE 60  . APPENDECTOMY     AGE 60  . BLADDER SURGERY    . Catarct Surgery    . LAPAROSCOPIC SMALL BOWEL RESECTION    . NM PET DX LYMPHOMA  10/2018   IN REMISSION  . occular pressure    . OTHER SURGICAL HISTORY    . REPLACEMENT TOTAL KNEE BILATERAL    . TONSILLECTOMY AND ADENOIDECTOMY    . UTERINE CANCER SURGERY      There were no vitals filed for this visit.  Subjective Assessment - 08/20/18 0937    Subjective  Pt states her pain is worse this morning and she did not sleep well. States it just feels weak and some days she can lift her leg into the car and then the next day she can't.    Patient Stated Goals  get rid of hip pain    Currently in Pain?  Yes    Pain Score  5     Pain Location  Leg    Pain Orientation  Right;Upper;Anterior    Pain Descriptors / Indicators  --   feels weak today   Pain Type  Chronic pain     Pain Onset  More than a month ago    Pain Frequency  Intermittent    Aggravating Factors   walking and getting up    Multiple Pain Sites  No                       [facility] Adult PT Treatment/Exercise - 08/20/18 0001      Timed Up and Go Test   TUG  Normal TUG    Normal TUG (seconds)  14      Knee/Hip Exercises: Aerobic   Nustep  L1 x5 man, L2 x5 min to promote LE movement and endurance       Knee/Hip Exercises: Seated   Long Arc Quad  Strengthening;Right;Left;2 sets;10 reps;Weights;1 set   1 set with 2lb   Long Arc Quad Weight  3 lbs.    Sit to Sand  with UE support;10 reps   elevated surface; +3 more slightly lowered     Moist Heat Therapy  Number Minutes Moist Heat  15 Minutes    Moist Heat Location  --   anterior thigh     Electrical Stimulation   Electrical Stimulation Location  Rt hip       seated clam with green band 10x Seated Marching with tapping 6 inch step - 2lb ankle weights - 10x each side        PT Short Term Goals - 07/22/18 1708      PT SHORT TERM GOAL #2   Title  Pt able to walk with even stride using LRAD    Baseline  no AD and able to demonstrate even stride for short distance    Status  Achieved        PT Long Term Goals - 08/10/18 1426      PT LONG TERM GOAL #1   Title  Pt will be able to walk to her aquatic class without AD    Baseline  walk to and from class without stopping and no AD on Friday, but not sure if she can do it every time    Status  Partially Met      PT LONG TERM GOAL #2   Title  Pt will be able to perform right hip abduction with 4+/5 MMT for improved gait    Status  On-going      PT LONG TERM GOAL #4   Title  FOTO < or = 46% limited for greater function    Status  On-going      PT LONG TERM GOAL #5   Title  Pt will complete 6 min walk test within age related norms    Status  On-going            Plan - 08/20/18 1012    Clinical Impression Statement  Pt was having more difficulty with  hip flexion today. She was able to demonstrate improved TUG time.  Pt was able to do an extra set of quad exercises and increased reps of sit to stand.  She needs cues for even weight distribution.  She responds well to heat an stim for pain management after exercises.  Pt will benefit from skilled PT to continue working on LE strength in order to regain maximum strength.      PT Treatment/Interventions  ADLs/Self Care Home Management;Biofeedback;Cryotherapy;Electrical Stimulation;Moist Heat;Passive range of motion;Dry needling;Taping;Manual techniques;Neuromuscular re-education;Therapeutic exercise;Therapeutic activities;Iontophoresis 64m/ml Dexamethasone    PT Next Visit Plan  TUG, hip flexor and quad strength, STM for muscle spasms and stim and heat    PT Home Exercise Plan   Access Code: [email or web address]    Consulted and Agree with Plan of Care  Patient       Patient will benefit from skilled therapeutic intervention in order to improve the following deficits and impairments:  Abnormal gait, Pain, Increased muscle spasms, Decreased strength, Decreased range of motion, Difficulty walking  Visit Diagnosis: Pain in right hip  Muscle weakness (generalized)  Difficulty in walking, not elsewhere classified     Problem List Patient Active Problem List   Diagnosis Date Noted  . Hypertension, essential, benign 01/16/2018  . Sensorineural hearing loss (SNHL) of both ears 01/16/2018  . Insomnia 01/16/2018  . MALT lymphoma ([facility] 01/15/2018  . H/O Cancer of vaginal vault ([facility] 01/15/2018  . Osteopenia of both hips 01/15/2018  . Hyperlipidemia 01/15/2018    [name] PT 08/20/2018, 10:15 AM  [facility] [address] [place] [address]Phone:  [phone]   Fax:  [phone]  Name: Linda [name] MRN: [id number] Date of Birth: 1932/01/30

## 2018-08-23 DIAGNOSIS — H401131 Primary open-angle glaucoma, bilateral, mild stage: Secondary | ICD-10-CM | POA: Diagnosis not present

## 2018-08-24 ENCOUNTER — Ambulatory Visit: Payer: Medicare Other | Admitting: Physical Therapy

## 2018-08-24 ENCOUNTER — Encounter: Payer: Self-pay | Admitting: Family Medicine

## 2018-08-24 ENCOUNTER — Ambulatory Visit (INDEPENDENT_AMBULATORY_CARE_PROVIDER_SITE_OTHER): Payer: Medicare Other | Admitting: Family Medicine

## 2018-08-24 ENCOUNTER — Telehealth: Payer: Self-pay | Admitting: Family Medicine

## 2018-08-24 VITALS — BP 124/86 | HR 69 | Temp 98.4°F | Resp 12 | Ht 65.0 in

## 2018-08-24 DIAGNOSIS — I10 Essential (primary) hypertension: Secondary | ICD-10-CM

## 2018-08-24 DIAGNOSIS — M25551 Pain in right hip: Secondary | ICD-10-CM | POA: Diagnosis not present

## 2018-08-24 DIAGNOSIS — Z23 Encounter for immunization: Secondary | ICD-10-CM | POA: Diagnosis not present

## 2018-08-24 DIAGNOSIS — Z0000 Encounter for general adult medical examination without abnormal findings: Secondary | ICD-10-CM

## 2018-08-24 DIAGNOSIS — L989 Disorder of the skin and subcutaneous tissue, unspecified: Secondary | ICD-10-CM | POA: Diagnosis not present

## 2018-08-24 DIAGNOSIS — M6281 Muscle weakness (generalized): Secondary | ICD-10-CM

## 2018-08-24 DIAGNOSIS — E785 Hyperlipidemia, unspecified: Secondary | ICD-10-CM | POA: Diagnosis not present

## 2018-08-24 DIAGNOSIS — M79604 Pain in right leg: Secondary | ICD-10-CM

## 2018-08-24 DIAGNOSIS — R262 Difficulty in walking, not elsewhere classified: Secondary | ICD-10-CM | POA: Diagnosis not present

## 2018-08-24 LAB — COMPREHENSIVE METABOLIC PANEL
ALK PHOS: 121 U/L — AB (ref 39–117)
ALT: 14 U/L (ref 0–35)
AST: 20 U/L (ref 0–37)
Albumin: 4.2 g/dL (ref 3.5–5.2)
BUN: 17 mg/dL (ref 6–23)
CALCIUM: 9.4 mg/dL (ref 8.4–10.5)
CO2: 31 meq/L (ref 19–32)
Chloride: 104 mEq/L (ref 96–112)
Creatinine, Ser: 0.68 mg/dL (ref 0.40–1.20)
GFR: 87.6 mL/min (ref >60.00)
Glucose, Bld: 80 mg/dL (ref 70–99)
Potassium: 4.3 mEq/L (ref 3.5–5.1)
Sodium: 142 mEq/L (ref 135–145)
TOTAL PROTEIN: 6.7 g/dL (ref 6.0–8.3)
Total Bilirubin: 0.4 mg/dL (ref 0.2–1.2)

## 2018-08-24 LAB — LIPID PANEL
Cholesterol: 138 mg/dL (ref 0–200)
HDL: 55.1 mg/dL (ref >39.00)
LDL Cholesterol: 59 mg/dL (ref 0–99)
NonHDL: 83.24
TRIGLYCERIDES: 123 mg/dL (ref 0.0–149.0)
Total CHOL/HDL Ratio: 3
VLDL: 24.6 mg/dL (ref 0.0–40.0)

## 2018-08-24 NOTE — Assessment & Plan Note (Signed)
Adequately controlled. No changes in current management. Eye exam current. Continue low salt diet. F/U in 5-6 months.

## 2018-08-24 NOTE — Therapy (Signed)
[facility] [address] [place], [place], [address] Phone: [phone]   Fax:  [phone]  Physical Therapy Treatment  [name] Details  Name: Linda [name] MRN: [id number] Date of Birth: [birth year] Referring Provider: Martinique, Betty G, MD   Encounter Date: 08/24/2018  Linda End of Session - 08/24/18 1714    Visit Number  15    Date for Linda Re-Evaluation  08/31/18    Linda Start Time  1324    Linda Stop Time  4010    Linda Time Calculation (min)  55 min    Activity Tolerance  [name] tolerated treatment well;No increased pain    Behavior During Therapy  WFL for tasks assessed/performed       Past Medical History:  Diagnosis Date  . Allergy   . Anxiety   . Arthritis   . Cancer [facility])    UTERINE CANCER  . GERD (gastroesophageal reflux disease)   . History of blood transfusion   . Hyperlipidemia   . Hypertension   . Insomnia   . Osteopenia    MILD  . Urine retention   . Uterine cancer ([place])    at age 73    Past Surgical History:  Procedure Laterality Date  . ABDOMINAL HYSTERECTOMY     AGE 89  . APPENDECTOMY     AGE 71  . BLADDER SURGERY    . Catarct Surgery    . LAPAROSCOPIC SMALL BOWEL RESECTION    . NM PET DX LYMPHOMA  10/2018   IN REMISSION  . occular pressure    . OTHER SURGICAL HISTORY    . REPLACEMENT TOTAL KNEE BILATERAL    . TONSILLECTOMY AND ADENOIDECTOMY    . UTERINE CANCER SURGERY      There were no vitals filed for this visit.  Subjective Assessment - 08/24/18 1451    Subjective  Linda states that her entire Rt side has been hurting.  The hip is better but everything hurts from shoulder down right side to thigh.  Linda states she saw the doctor who wants to refer her to orthopedist if Linda does not help.    Pertinent History  bilat knee replacements, arthritis throughout    [name] Stated Goals  get rid of hip pain    Currently in Pain?  Yes    Pain Score  7    when turning Rt or moving   Pain  Descriptors / Indicators  Sore    Pain Type  Chronic pain    Pain Radiating Towards  back and down front of thigh    Pain Frequency  Intermittent    Multiple Pain Sites  No                       [facility] Adult Linda Treatment/Exercise - 08/24/18 0001      Knee/Hip Exercises: Aerobic   Nustep  L1 x 10 min to promote LE movement and endurance       Knee/Hip Exercises: Seated   Other Seated Knee/Hip Exercises  thoracic ext - increased pain; thoracic flexion and flex with rotation - 10x      Moist Heat Therapy   Number Minutes Moist Heat  15 Minutes    Moist Heat Location  Lumbar Spine      Electrical Stimulation   Electrical Stimulation Location  lumbar    Electrical Stimulation Action  IFC    Electrical Stimulation Parameters  to tolerance x 15 min  Electrical Stimulation Goals  Pain      Manual Therapy   Myofascial Release  lumbar distraction in sidelying; sacral distraction               Linda Short Term Goals - 07/22/18 1708      Linda SHORT TERM GOAL #2   Title  Linda able to walk with even stride using LRAD    Baseline  no AD and able to demonstrate even stride for short distance    Status  Achieved        Linda Long Term Goals - 08/10/18 1426      Linda LONG TERM GOAL #1   Title  Linda will be able to walk to her aquatic class without AD    Baseline  walk to and from class without stopping and no AD on Friday, but not sure if she can do it every time    Status  Partially Met      Linda LONG TERM GOAL #2   Title  Linda will be able to perform right hip abduction with 4+/5 MMT for improved gait    Status  On-going      Linda LONG TERM GOAL #4   Title  FOTO < or = 46% limited for greater function    Status  On-going      Linda LONG TERM GOAL #5   Title  Linda will complete 6 min walk test within age related norms    Status  On-going            Plan - 08/24/18 1716    Clinical Impression Statement  Linda was having more pain today so focused on pain management  during today's session.  Linda ambulated in clinic more slowly with antalgic gait.  Linda has increased pain with thoracic extension and rotation to the right.  Linda responded well to thoracic flexion and STM for pain management.  Linda will benefit from skilled Linda to work on improved ROM and strength to improve gait and return to funcitonal activities.     Linda Treatment/Interventions  ADLs/Self Care Home Management;Biofeedback;Cryotherapy;Electrical Stimulation;Moist Heat;Passive range of motion;Dry needling;Taping;Manual techniques;Neuromuscular re-education;Therapeutic exercise;Therapeutic activities;Iontophoresis 82m/ml Dexamethasone    Linda Next Visit Plan  lumbar and thoracic ROM, hip and quad strength as tolerated    Linda Home Exercise Plan   Access Code: [email or web address]    Consulted and Agree with Plan of Care  [name]       [name] will benefit from skilled therapeutic intervention in order to improve the following deficits and impairments:  Abnormal gait, Pain, Increased muscle spasms, Decreased strength, Decreased range of motion, Difficulty walking  Visit Diagnosis: Pain in right hip  Muscle weakness (generalized)  Difficulty in walking, not elsewhere classified     Problem List [name] Active Problem List   Diagnosis Date Noted  . Hypertension, essential, benign 01/16/2018  . Sensorineural hearing loss (SNHL) of both ears 01/16/2018  . Insomnia 01/16/2018  . [place]ALT [place] ([place] [place] 01/15/2018  . H/O [place] of vaginal [place] 01/15/2018  . Osteopenia of both hips 01/15/2018  . Hyperlipidemia 01/15/2018    Linda [name], Linda 08/24/2018, 5:28 PM  [facility] [address] [place] [address]Phone: [phone]  Fax:  [phone] Name: Linda MCCARDLEMRN: [id number] of Birth: 806/29/1933

## 2018-08-24 NOTE — Progress Notes (Addendum)
HPI:   [name] is a 84 y.o. female, who is here today for her routine physical.  Last CPE: 08/2017, a year ago.  Regular exercise 3 or more time per week: Water aerobics 3 times per week and daily walking. Following a healthy diet: Yes.  She lives at [facility]. Daughter and her family live in [place].     Immunization History  Administered Date(s) Administered  . Influenza, High Dose Seasonal PF 08/24/2018  . Zoster Recombinat (Shingrix) 09/22/2017, 11/24/2017    She has some concerns today.  Right-sided back pain radiated to thigh. Soreness,can be max 8/10, most of the time 2/10. Constant,exacerbated by walking,getting up,and movement. Alleviated by rest. No associated numbness,saddle anesthesia,or changes in urine/bowel continence. She takes tylenol daily because OA.   3 weeks of "explosive diarrhea" , it happens every Sunday after eating from buffet. She has not identified foods that exacerbating symptoms.She has Hx of chronic diarrhea,s/o colectomy.  Requesting "kidney scan."According to pt,her urologist in [place] ordered scan every 2 years. She is not sure about indication. Denies dysuria,increased urinary frequency, gross hematuria,or decreased urine output.  She is also requesting referral to dermatologist. According to pt,her beautician noted skin lesions behind ears and she has not had a "skin check" in a while.   Medicare Wellness visit:  Chronic medical problems:  Recurrent UTI,follows with urologist. HLD,hearing loss,MALT lymphoma (follows with GI),OA,chronic diarrhea,anxiety,and insomnia on Lunesta among some.  Functional Status Survey: Hearing loss,wears hearing aids.  Is the patient deaf or have difficulty hearing?: Yes(Has hearing aids) Does the patient have difficulty seeing, even when wearing glasses/contacts?: No Does the patient have difficulty concentrating, remembering, or making decisions?: No Does  the patient have difficulty walking or climbing stairs?: Yes(Just recently due to RLE pain.) Does the patient have difficulty dressing or bathing?: No Does the patient have difficulty doing errands alone such as visiting a doctor's office or shopping?: No  Fall Risk  08/24/2018  Falls in the past year? No  Risk for fall due to : Impaired balance/gait     Providers she sees regularly:  Eye care provider: Dr Hendricks Limes visit 01/2018. GI, Dr Ardis Hughs, last visit 05/24/2018. Podiatrist, Dr Paulla Dolly. Dr Erik Obey, ENT, last seen 03/2018. Urologist,Dr Gloriann Loan, last seen 02/09/18.  Depression screen [facility] 2/9 08/24/2018  Decreased Interest 0  Down, Depressed, Hopeless 0  PHQ - 2 Score 0    Mini-Cog - 08/24/18 1041    Normal clock drawing test?  yes    How many words correct?  3       Hearing Screening Comments: Intact whisper hearing with hearing aids. Vision Screening Comments: Refused. She had eye exam yesterday.  Review of Systems  Constitutional: Negative for appetite change, fatigue and fever.  HENT: Negative for dental problem, hearing loss, mouth sores, sore throat, trouble swallowing and voice change.   Eyes: Negative for redness and visual disturbance.  Respiratory: Negative for cough, shortness of breath and wheezing.   Cardiovascular: Negative for chest pain and leg swelling.  Gastrointestinal: Positive for diarrhea. Negative for abdominal pain, nausea and vomiting.       No changes in bowel habits.  Endocrine: Negative for cold intolerance, heat intolerance, polydipsia, polyphagia and polyuria.  Genitourinary: Negative for decreased urine volume, dysuria, hematuria, vaginal bleeding and vaginal discharge.  Musculoskeletal: Positive for arthralgias, back pain and gait problem.  Skin: Negative for color change and rash.  Allergic/Immunologic: Negative for environmental allergies.  Neurological: Negative for syncope, weakness  and headaches.  Hematological: Negative for adenopathy. Does  not bruise/bleed easily.  Psychiatric/Behavioral: Positive for sleep disturbance. Negative for confusion. The patient is nervous/anxious.   All other systems reviewed and are negative.     Current Outpatient Medications on File Prior to Visit  Medication Sig Dispense Refill  . acetaminophen (TYLENOL) 500 MG tablet Take 500 mg by mouth every 6 (six) hours as needed.    . bimatoprost (LUMIGAN) 0.01 % SOLN Place 1 drop into both eyes at bedtime.    . brimonidine (ALPHAGAN) 0.15 % ophthalmic solution Place 1 drop into both eyes 3 (three) times daily.    . Catheters MISC by Does not apply route.    . cetirizine (ZYRTEC) 10 MG tablet Take 10 mg by mouth daily.    . eszopiclone (LUNESTA) 2 MG TABS tablet Take 1 tablet (2 mg total) by mouth at bedtime as needed for sleep. 30 tablet 3  . loperamide (IMODIUM) 2 MG capsule Take by mouth.    . losartan (COZAAR) 50 MG tablet Take 1 tablet (50 mg total) by mouth daily. 90 tablet 2  . nitrofurantoin (MACRODANTIN) 50 MG capsule     . predniSONE (DELTASONE) 50 MG tablet Take 1 tablet daily for 5 days. 5 tablet 0  . rosuvastatin (CRESTOR) 5 MG tablet      Current Facility-Administered Medications on File Prior to Visit  Medication Dose Route Frequency Provider Last Rate Last Dose  . 0.9 %  sodium chloride infusion  500 mL Intravenous Once Milus Banister, MD         Past Medical History:  Diagnosis Date  . Allergy   . Anxiety   . Arthritis   . Cancer [facility])    UTERINE CANCER  . GERD (gastroesophageal reflux disease)   . History of blood transfusion   . Hyperlipidemia   . Hypertension   . Insomnia   . Osteopenia    MILD  . Urine retention   . Uterine cancer ([place])    at age 72    Past Surgical History:  Procedure Laterality Date  . ABDOMINAL HYSTERECTOMY     AGE 51  . APPENDECTOMY     AGE 40  . BLADDER SURGERY    . Catarct Surgery    . LAPAROSCOPIC SMALL BOWEL RESECTION    . NM PET DX LYMPHOMA  10/2018   IN REMISSION  . occular  pressure    . OTHER SURGICAL HISTORY    . REPLACEMENT TOTAL KNEE BILATERAL    . TONSILLECTOMY AND ADENOIDECTOMY    . UTERINE CANCER SURGERY      No Known Allergies  Family History  Problem Relation Age of Onset  . Uterine cancer Sister   . COPD Maternal Grandfather   . Uterine cancer Maternal Grandmother   . Colon cancer Neg Hx   . Esophageal cancer Neg Hx   . Liver cancer Neg Hx   . Pancreatic cancer Neg Hx   . Rectal cancer Neg Hx   . Stomach cancer Neg Hx     Social History   Socioeconomic History  . Marital status: Single    Spouse name: Not on file  . Number of children: Not on file  . Years of education: Not on file  . Highest education level: Not on file  Occupational History  . Not on file  Social Needs  . Financial resource strain: Not on file  . Food insecurity:    Worry: Not on file  Inability: Not on file  . Transportation needs:    Medical: Not on file    Non-medical: Not on file  Tobacco Use  . Smoking status: Never Smoker  . Smokeless tobacco: Never Used  Substance and Sexual Activity  . Alcohol use: Yes    Comment: one glass of wine with dinner  . Drug use: No  . Sexual activity: Never  Lifestyle  . Physical activity:    Days per week: Not on file    Minutes per session: Not on file  . Stress: Not on file  Relationships  . Social connections:    Talks on phone: Not on file    Gets together: Not on file    Attends religious service: Not on file    Active member of club or organization: Not on file    Attends meetings of clubs or organizations: Not on file    Relationship status: Not on file  Other Topics Concern  . Not on file  Social History Narrative  . Not on file     Vitals:   08/24/18 0955  BP: 124/86  Pulse: 69  Resp: 12  Temp: 98.4 F (36.9 C)  SpO2: 97%   Body mass index is 34.61 kg/m.   Wt Readings from Last 3 Encounters:  05/24/18 208 lb (94.3 kg)  05/20/18 208 lb (94.3 kg)    Physical Exam  Nursing note  and vitals reviewed. Constitutional: She is oriented to person, place, and time. She appears well-developed. No distress.  HENT:  Head: Normocephalic and atraumatic.  Right Ear: Tympanic membrane, external ear and ear canal normal. Decreased hearing is noted.  Left Ear: Tympanic membrane, external ear and ear canal normal. Decreased hearing is noted.  Mouth/Throat: Uvula is midline, oropharynx is clear and moist and mucous membranes are normal.  Eyes: Pupils are equal, round, and reactive to light. Conjunctivae and EOM are normal.  Neck: No tracheal deviation present.  Cardiovascular: Normal rate and regular rhythm.  No murmur heard. Pulses:      Dorsalis pedis pulses are 2+ on the right side, and 2+ on the left side.  Respiratory: Effort normal and breath sounds normal. No respiratory distress.  GI: Soft. She exhibits no mass. There is no hepatomegaly. There is no tenderness.  Musculoskeletal: She exhibits no edema.       Right hip: She exhibits decreased range of motion and tenderness.       Lumbar back: She exhibits tenderness. She exhibits no bony tenderness.  Lymphadenopathy:    She has no cervical adenopathy.  Neurological: She is alert and oriented to person, place, and time. She has normal strength. No cranial nerve deficit. Coordination and gait normal.  Reflex Scores:      Bicep reflexes are 2+ on the right side and 2+ on the left side.      Patellar reflexes are 2+ on the right side and 2+ on the left side. Skin: Skin is warm. No rash noted. No erythema.  Psychiatric: She has a normal mood and affect. Her speech is normal.  Well groomed, good eye contact.    ASSESSMENT AND PLAN:  Ms. Dalilah Curlin [id number] was here today annual physical examination.   Orders Placed This Encounter  Procedures  . Flu vaccine HIGH DOSE PF  . Comprehensive metabolic panel  . Lipid panel  . Ambulatory referral to Orthopedic Surgery  . Ambulatory referral to Dermatology   Lab Results    Component Value Date   CHOL 138  08/24/2018   HDL 55.10 08/24/2018   LDLCALC 59 08/24/2018   TRIG 123.0 08/24/2018   CHOLHDL 3 08/24/2018   Lab Results  Component Value Date   CREATININE 0.68 08/24/2018   BUN 17 08/24/2018   NA 142 08/24/2018   K 4.3 08/24/2018   CL 104 08/24/2018   CO2 31 08/24/2018   Lab Results  Component Value Date   ALT 14 08/24/2018   AST 20 08/24/2018   ALKPHOS 121 (H) 08/24/2018   BILITOT 0.4 08/24/2018     Routine general medical examination at a health care facility  We discussed the importance of regular physical activity and healthy diet for prevention of chronic illness and/or complications. Preventive guidelines reviewed. Vaccination: Influenza vaccine given today.  Next CPE in a year.   Medicare annual wellness visit, subsequent  We discussed the importance of staying active, physically and mentally, as well as the benefits of a healthy/balance diet. Low impact exercise that involve stretching and strengthing are ideal. Vaccines up to date. We discussed preventive screening for the next 5-10 years, summery of recommendations reviewed given in AVS: Periodic eye exam and glaucoma screening. Fall prevention.  Advance directives and end of life discussed, she has POA and living will.   Medicare questionnaire scanned.   Right leg pain  OA,radicular pain. Continue Tylenol. Appt with ortho will be arranged.  -     Ambulatory referral to Orthopedic Surgery  Skin lesion  I do not appreciate skin lesions of concern.  -     Ambulatory referral to Dermatology  Flu vaccine need -     Flu vaccine HIGH DOSE PF   Hyperlipidemia No changes in current management, will follow labs done today and will give further recommendations accordingly.   Hypertension, essential, benign Adequately controlled. No changes in current management. Eye exam current. Continue low salt diet. F/U in 5-6 months.     Return in about 6 months  (around 02/22/2019) for Insomnia.     Betty G. Martinique, MD  [facility]. [place] office.

## 2018-08-24 NOTE — Patient Instructions (Addendum)
A few things to remember from today's visit:   Routine general medical examination at a health care facility  Hyperlipidemia, unspecified hyperlipidemia type - Plan: Comprehensive metabolic panel, Lipid panel  Hypertension, essential, benign - Plan: Comprehensive metabolic panel   A few tips:  -As we age balance is not as good as it was, so there is a higher risks for falls. Please remove small rugs and furniture that is "in your way" and could increase the risk of falls. Stretching exercises may help with fall prevention: Yoga and Tai Chi are some examples. Low impact exercise is better, so you are not very achy the next day.  -Sun screen and avoidance of direct sun light recommended. Caution with dehydration, if working outdoors be sure to drink enough fluids.  - Some medications are not safe as we age, increases the risk of side effects and can potentially interact with other medication you are also taken;  including some of over the counter medications. Be sure to let me know when you start a new medication even if it is a dietary/vitamin supplement.   -Healthy diet low in red meet/animal fat and sugar + regular physical activity is recommended.       Screening schedule for the next 5-10 years:   Glaucoma screening/eye exam every 1-2 years.  Mammogram for breast cancer screening annually.  Flu vaccine annually.   Fall prevention   Advance directives:  Please see a lawyer and/or go to this website to help you with advanced directives and designating a health care power of attorney so that your wishes will be followed should you become too ill to make your own medical decisions.  [email or web address]       Please be sure medication list is accurate. If a new problem present, please set up appointment sooner than planned today.

## 2018-08-24 NOTE — Assessment & Plan Note (Signed)
No changes in current management, will follow labs done today and will give further recommendations accordingly.  

## 2018-08-24 NOTE — Telephone Encounter (Signed)
Error

## 2018-08-26 ENCOUNTER — Telehealth: Payer: Self-pay | Admitting: Family Medicine

## 2018-08-26 ENCOUNTER — Ambulatory Visit: Payer: Medicare Other | Admitting: Physical Therapy

## 2018-08-26 DIAGNOSIS — R262 Difficulty in walking, not elsewhere classified: Secondary | ICD-10-CM

## 2018-08-26 DIAGNOSIS — M6281 Muscle weakness (generalized): Secondary | ICD-10-CM | POA: Diagnosis not present

## 2018-08-26 DIAGNOSIS — M25551 Pain in right hip: Secondary | ICD-10-CM

## 2018-08-26 NOTE — Telephone Encounter (Unsigned)
Copied from [place] [id number]. Topic: Quick Communication - See Telephone Encounter >> Aug 26, 2018  9:32 AM Neva Seat wrote: Pt has not heard back on the orthopedic referral. Pt needing lab results. Please call pt back to let her know on both.

## 2018-08-27 ENCOUNTER — Encounter: Payer: Self-pay | Admitting: Family Medicine

## 2018-08-27 NOTE — Therapy (Signed)
[facility] [address] [place], [place], [address] Phone: [phone]   Fax:  [phone]  Physical Therapy Treatment  Patient Details  Name: Linda [name] MRN: [id number] Date of Birth: [birth year] Referring Provider: Martinique, Betty G, MD   Encounter Date: 08/26/2018  PT End of Session - 08/26/18 1512    Visit Number  16    Date for PT Re-Evaluation  08/31/18    PT Start Time  2641    PT Stop Time  1528    PT Time Calculation (min)  42 min    Activity Tolerance  Patient tolerated treatment well;No increased pain    Behavior During Therapy  WFL for tasks assessed/performed       Past Medical History:  Diagnosis Date  . Allergy   . Anxiety   . Arthritis   . Cancer [facility])    UTERINE CANCER  . GERD (gastroesophageal reflux disease)   . History of blood transfusion   . Hyperlipidemia   . Hypertension   . Insomnia   . Osteopenia    MILD  . Urine retention   . Uterine cancer ([place])    at age 84    Past Surgical History:  Procedure Laterality Date  . ABDOMINAL HYSTERECTOMY     AGE 84  . APPENDECTOMY     AGE 84  . BLADDER SURGERY    . Catarct Surgery    . LAPAROSCOPIC SMALL BOWEL RESECTION    . NM PET DX LYMPHOMA  10/2018   IN REMISSION  . occular pressure    . OTHER SURGICAL HISTORY    . REPLACEMENT TOTAL KNEE BILATERAL    . TONSILLECTOMY AND ADENOIDECTOMY    . UTERINE CANCER SURGERY      There were no vitals filed for this visit.  Subjective Assessment - 08/27/18 0755    Subjective  Pt report the original pain she came in with is better and she is only having back and leg pain.    Patient Stated Goals  get rid of hip pain    Currently in Pain?  No/denies         [facility] PT Assessment - 08/27/18 0001      Assessment   Medical Diagnosis  M25.551 (ICD-10-CM) - Hip pain, right; M70.61 (ICD-10-CM) - Trochanteric bursitis of right hip    Prior Therapy  No      6 minute walk test results    Aerobic Endurance Distance Walked  655    Endurance additional comments  5 min 10 sec      Timed Up and Go Test   Normal TUG (seconds)  13                   [facility] Adult PT Treatment/Exercise - 08/27/18 0001      Self-Care   Other Self-Care Comments   reviewed HEP verbally      Knee/Hip Exercises: Aerobic   Nustep  L1 x 10 min to promote LE movement and endurance       Moist Heat Therapy   Number Minutes Moist Heat  15 Minutes    Moist Heat Location  Lumbar Spine      Electrical Stimulation   Electrical Stimulation Location  lumbar    Electrical Stimulation Action  IFC    Electrical Stimulation Parameters  to tolerance x 15 min    Electrical Stimulation Goals  Pain  PT Short Term Goals - 07/22/18 1708      PT SHORT TERM GOAL #2   Title  Pt able to walk with even stride using LRAD    Baseline  no AD and able to demonstrate even stride for short distance    Status  Achieved        PT Long Term Goals - 08/27/18 0756      PT LONG TERM GOAL #1   Title  Pt will be able to walk to her aquatic class without AD    Status  Achieved      PT LONG TERM GOAL #2   Title  Pt will be able to perform right hip abduction with 4+/5 MMT for improved gait    Baseline  4/5    Status  Partially Met      PT LONG TERM GOAL #3   Title  pt will report 60% less pain during typical day    Baseline  no more pain in the hip    Status  Achieved      PT LONG TERM GOAL #4   Title  FOTO < or = 46% limited for greater function    Baseline  57%    Status  Partially Met      PT LONG TERM GOAL #5   Title  Pt will complete 6 min walk test within age related norms    Baseline  unable to complete    Status  Not Met            Plan - 08/27/18 0758    Clinical Impression Statement  Pt will discharge with HEP today.  She no longer has hip pain and she will see orthopedist about her back and leg pain at this time.    PT Treatment/Interventions  ADLs/Self Care  Home Management;Biofeedback;Cryotherapy;Electrical Stimulation;Moist Heat;Passive range of motion;Dry needling;Taping;Manual techniques;Neuromuscular re-education;Therapeutic exercise;Therapeutic activities;Iontophoresis 70m/ml Dexamethasone    PT Home Exercise Plan   Access Code: [email or web address]    Consulted and Agree with Plan of Care  Patient       Patient will benefit from skilled therapeutic intervention in order to improve the following deficits and impairments:  Abnormal gait, Pain, Increased muscle spasms, Decreased strength, Decreased range of motion, Difficulty walking  Visit Diagnosis: Pain in right hip  Muscle weakness (generalized)  Difficulty in walking, not elsewhere classified     Problem List Patient Active Problem List   Diagnosis Date Noted  . Hypertension, essential, benign 01/16/2018  . Sensorineural hearing loss (SNHL) of both ears 01/16/2018  . Insomnia 01/16/2018  . MALT lymphoma ([place] 01/15/2018  . H/O Cancer of vaginal [place] 01/15/2018  . Osteopenia of both hips 01/15/2018  . Hyperlipidemia 01/15/2018    [name] PT 08/27/2018, 10:15 AM  [facility] [address] [place] [address]Phone: [phone]  Fax:  [phone] Name: Linda GENTRYMRN: [id number] of Birth: 828-05-1932 PHYSICAL THERAPY DISCHARGE SUMMARY  Visits from Start of Care: 16  Current functional level related to goals / functional outcomes: See above goals   Remaining deficits: See above   Education / Equipment: HEP  Plan: Patient agrees to discharge.  Patient goals were partially met. Patient is being discharged due to being pleased with the current functional level.  ?????    Pleased with outcome of hip, but still limited with back and leg pain, pt will see orthopedist and return if needed  Zannie Cove, PT 08/27/18 10:17 AM

## 2018-08-27 NOTE — Telephone Encounter (Signed)
Patient informed of lab results and instructions. Patient also given information regarding referral.

## 2018-09-02 DIAGNOSIS — Z79899 Other long term (current) drug therapy: Secondary | ICD-10-CM | POA: Diagnosis not present

## 2018-09-07 ENCOUNTER — Other Ambulatory Visit: Payer: Self-pay | Admitting: *Deleted

## 2018-09-07 MED ORDER — ROSUVASTATIN CALCIUM 5 MG PO TABS: 5.0000 mg | ORAL_TABLET | Freq: Every day | ORAL | 2 refills | Status: DC

## 2018-09-14 ENCOUNTER — Ambulatory Visit (INDEPENDENT_AMBULATORY_CARE_PROVIDER_SITE_OTHER): Payer: Medicare Other | Admitting: Orthopaedic Surgery

## 2018-09-14 ENCOUNTER — Ambulatory Visit (INDEPENDENT_AMBULATORY_CARE_PROVIDER_SITE_OTHER): Payer: Medicare Other

## 2018-09-14 ENCOUNTER — Encounter (INDEPENDENT_AMBULATORY_CARE_PROVIDER_SITE_OTHER): Payer: Self-pay | Admitting: Orthopaedic Surgery

## 2018-09-14 ENCOUNTER — Telehealth (INDEPENDENT_AMBULATORY_CARE_PROVIDER_SITE_OTHER): Payer: Self-pay | Admitting: Orthopaedic Surgery

## 2018-09-14 VITALS — BP 141/60 | HR 75 | Ht 65.0 in | Wt 195.0 lb

## 2018-09-14 DIAGNOSIS — G8929 Other chronic pain: Secondary | ICD-10-CM

## 2018-09-14 DIAGNOSIS — M25551 Pain in right hip: Secondary | ICD-10-CM | POA: Diagnosis not present

## 2018-09-14 DIAGNOSIS — M7061 Trochanteric bursitis, right hip: Secondary | ICD-10-CM | POA: Diagnosis not present

## 2018-09-14 DIAGNOSIS — M545 Low back pain, unspecified: Secondary | ICD-10-CM

## 2018-09-14 MED ORDER — METHYLPREDNISOLONE ACETATE 40 MG/ML IJ SUSP
40.0000 mg | INTRAMUSCULAR | Status: AC | PRN
  Administered 2018-09-14: 40 mg via INTRA_ARTICULAR

## 2018-09-14 MED ORDER — BUPIVACAINE HCL 0.25 % IJ SOLN
2.0000 mL | INTRAMUSCULAR | Status: AC | PRN
  Administered 2018-09-14: 2 mL via INTRA_ARTICULAR

## 2018-09-14 MED ORDER — LIDOCAINE HCL 1 % IJ SOLN
0.5000 mL | INTRAMUSCULAR | Status: AC | PRN
  Administered 2018-09-14: .5 mL

## 2018-09-14 NOTE — Progress Notes (Addendum)
Office Visit Note   Patient: Linda [name] CBJSEGBTD           Date of Birth: 06-Mar-1932           MRN: [id number] Visit Date: 09/14/2018              Requested by: Martinique, Betty G, MD [address] [place], [place] [address] PCP: Martinique, Betty G, MD   Assessment & Plan: Visit Diagnoses:  1. Chronic bilateral low back pain, unspecified whether sciatica present   2. Pain in right hip   3. Trochanteric bursitis, right hip     Plan: Right trochanteric injection performed greater than 50% relief.  Postinjection she can ambulate without a Trendelenburg gait and was standing up straight.  I will recheck her in 3 weeks.  If she is having persistent problems I recommend lumbar MRI scan to evaluate her for lateral recess stenosis with her  anterolisthesis at L4-5.  Follow-Up Instructions: Return in about 3 weeks (around 10/05/2018).   Orders:  Orders Placed This Encounter  Procedures  . Large Joint Inj  . XR HIP UNILAT W OR W/O PELVIS 2-3 VIEWS RIGHT  . XR Lumbar Spine 2-3 Views   No orders of the defined types were placed in this encounter.     Procedures: Large Joint Inj: R greater trochanter on 09/14/2018 2:39 PM Details: 22 G 3.5 in needle Medications: 0.5 mL lidocaine 1 %; 2 mL bupivacaine 0.25 %; 40 mg methylPREDNISolone acetate 40 MG/ML      Clinical Data: No additional findings.   Subjective: Chief Complaint  Patient presents with  . Lower Back - Pain  . Right Hip - Pain    HPI 84 year old female last lived in [place] and is now moved to [place] and has a son locally.  Patient's had problems with trochanteric bursitis on the right with symptoms for 3 months.  She brought with her records of bilateral total knee arthroplasties.  She is noticed weakness in the right hip originally had to ambulate with a walker.  She is been through several weeks of therapy and is progressed from a walker to a cane and now can walk in the office without the cane but has right  Trendelenburg gait.  Review of Systems patient has a history of lymphoma.  History of cancer of the vaginal vault, hypertension.  Insomnia hyperlipidemia.  Osteopenia noted in the hips.   Objective: Vital Signs: BP (!) 141/60   Pulse 75   Ht 5\' 5"  (1.651 m)   Wt 195 lb (88.5 kg)   BMI 32.45 kg/m   Physical Exam  Constitutional: She is oriented to person, place, and time. She appears well-developed.  HENT:  Head: Normocephalic.  Right Ear: External ear normal.  Left Ear: External ear normal.  Eyes: Pupils are equal, round, and reactive to light.  Neck: No tracheal deviation present. No thyromegaly present.  Cardiovascular: Normal rate.  Pulmonary/Chest: Effort normal.  Abdominal: Soft.  Neurological: She is alert and oriented to person, place, and time.  Skin: Skin is warm and dry.  Psychiatric: She has a normal mood and affect. Her behavior is normal.    Ortho Exam patient has positive right Trendelenburg gait.  She has weakness with hip flexor on the right with resisted testing with anterior groin pain.  Negative logroll to the hips.  Mild crepitus knee range of motion with good stability medial lateral collateral ligaments full extension to both knees.  Intact pedal pulses.  She has  some sciatic notch tenderness on the right negative on the left.  No hip extension weakness.  Opposite left knee has strong hip flexion against resisted testing.  No sensory deficit anteriorly over her thigh.  Specialty Comments:  No specialty comments available.  Imaging: Xr Hip Unilat W Or W/o Pelvis 2-3 Views Right  Result Date: 09/14/2018 AP pelvis x-rays obtained including hips.  This shows no femoral neck lesions no sclerotic or lytic pelvic changes.  No significant hip degenerative arthritis. Impression: Normal hip pelvis x-rays.  Multiple clips left hemipelvis from previous surgery noted.  Xr Lumbar Spine 2-3 Views  Result Date: 09/14/2018 AP lateral lumbar spine x-rays are obtained and  reviewed this shows previous retroperitoneal vascular clips.  There is grade 1 anterolisthesis at L4-5 with facet arthropathy.  Negative for acute compression fractures.  No lytic or sclerotic bone lesions.  L2-3 disc space narrowing is noted with endplate spurring. Impression: Degenerative anterolisthesis L4-5, L2-3 disc space narrowing and spurring.    PMFS History: Patient Active Problem List   Diagnosis Date Noted  . Trochanteric bursitis, right hip 09/14/2018  . Chronic bilateral low back pain 09/14/2018  . Hypertension, essential, benign 01/16/2018  . Sensorineural hearing loss (SNHL) of both ears 01/16/2018  . Insomnia 01/16/2018  . MALT lymphoma ([place]) 01/15/2018  . H/O Cancer of vaginal vault ([place]) 01/15/2018  . Osteopenia of both hips 01/15/2018  . Hyperlipidemia 01/15/2018   Past Medical History:  Diagnosis Date  . Allergy   . Anxiety   . Arthritis   . Cancer [facility])    UTERINE CANCER  . GERD (gastroesophageal reflux disease)   . History of blood transfusion   . Hyperlipidemia   . Hypertension   . Insomnia   . Osteopenia    MILD  . Urine retention   . Uterine cancer ([place])    at age 61    Family History  Problem Relation Age of Onset  . Uterine cancer Sister   . COPD Maternal Grandfather   . Uterine cancer Maternal Grandmother   . Colon cancer Neg Hx   . Esophageal cancer Neg Hx   . Liver cancer Neg Hx   . Pancreatic cancer Neg Hx   . Rectal cancer Neg Hx   . Stomach cancer Neg Hx     Past Surgical History:  Procedure Laterality Date  . ABDOMINAL HYSTERECTOMY     AGE 62  . APPENDECTOMY     AGE 38  . BLADDER SURGERY    . Catarct Surgery    . LAPAROSCOPIC SMALL BOWEL RESECTION    . NM PET DX LYMPHOMA  10/2018   IN REMISSION  . occular pressure    . OTHER SURGICAL HISTORY    . REPLACEMENT TOTAL KNEE BILATERAL    . TONSILLECTOMY AND ADENOIDECTOMY    . UTERINE CANCER SURGERY     Social History   Occupational History  . Not on file  Tobacco Use  .  Smoking status: Never Smoker  . Smokeless tobacco: Never Used  Substance and Sexual Activity  . Alcohol use: Yes    Comment: one glass of wine with dinner  . Drug use: No  . Sexual activity: Never

## 2018-09-14 NOTE — Telephone Encounter (Signed)
Patient is wodnering if todays visit notes will be in MyChart for her to review, she would like to know what type of injection she received as well. # [phone]

## 2018-09-15 NOTE — Telephone Encounter (Signed)
Can you please advise on office note going to My Chart?

## 2018-09-16 ENCOUNTER — Other Ambulatory Visit: Payer: Self-pay | Admitting: Family Medicine

## 2018-09-16 DIAGNOSIS — G4700 Insomnia, unspecified: Secondary | ICD-10-CM

## 2018-09-16 NOTE — Telephone Encounter (Signed)
I called and spoke with patient. She thinks that the injection has helped some and feels some better. I advised Dr. Lorin Mercy is out of the office and that he is the one that will have to push the office note to My Chart. I did advise that I would work with him to see if we can have this done for her when he is back in the office next week.

## 2018-09-16 NOTE — Telephone Encounter (Signed)
This is something Dr Lorin Mercy will need to do an addendum and do.  At the top of the page there should be a place for him to check and share with patient.  We can look at it with him when he is back in clinic.

## 2018-09-24 NOTE — Telephone Encounter (Signed)
I called patient and advised. 

## 2018-09-24 NOTE — Telephone Encounter (Signed)
Dr. Lorin Mercy shared with patient.

## 2018-10-05 ENCOUNTER — Encounter (INDEPENDENT_AMBULATORY_CARE_PROVIDER_SITE_OTHER): Payer: Self-pay | Admitting: Orthopaedic Surgery

## 2018-10-05 ENCOUNTER — Ambulatory Visit (INDEPENDENT_AMBULATORY_CARE_PROVIDER_SITE_OTHER): Payer: Medicare Other | Admitting: Orthopaedic Surgery

## 2018-10-05 VITALS — Ht 65.0 in | Wt 195.0 lb

## 2018-10-05 DIAGNOSIS — G8929 Other chronic pain: Secondary | ICD-10-CM | POA: Diagnosis not present

## 2018-10-05 DIAGNOSIS — M545 Low back pain: Secondary | ICD-10-CM

## 2018-10-05 NOTE — Progress Notes (Signed)
Office Visit Note   Patient: Linda [name] VELFYBOFB           Date of Birth: 07-13-1932           MRN: [id number] Visit Date: 10/05/2018              Requested by: Martinique, Betty G, MD [address] [place], [place] [address] PCP: Martinique, Betty G, MD   Assessment & Plan: Visit Diagnoses:  1. Chronic bilateral low back pain, unspecified whether sciatica present     Plan: Patient has history of lymphoma and also uterine cancer.  Recurrent pain after trochanteric injection with anterolisthesis at the L4-5 level with facet arthropathy.  I recommend proceeding with an MRI scan lumbar office follow-up after scan for review. Follow-Up Instructions: No follow-ups on file.   Orders:  Orders Placed This Encounter  Procedures  . MR Lumbar Spine w/o contrast   No orders of the defined types were placed in this encounter.     Procedures: No procedures performed   Clinical Data: No additional findings.   Subjective: Chief Complaint  Patient presents with  . Lower Back - Pain, Follow-up  . Right Leg - Pain, Follow-up    HPI 85 year old female returns 3 weeks post right trochanteric injection.  She states she got good relief for 4 to 5 days that were off and she is had recurrent back and right leg pain.  She states she has pain with any type of mobility.  She gets relief if she is supine and on a heating pad and not moving.  Her son is with her today.  She has had previous bilateral total knee arthroplasties done in [place].  She does have a past history of lymphoma also cancer of the vaginal vault. Her symptoms now been present for several months.  Previous x-rays have showed degenerative anterolisthesis grade 1 at the L4-5 level.  Disc space narrowing at L2-3. Review of Systems 14 point review of systems updated unchanged from 09/14/2018 office visit other than as mentioned in HPI.   Objective: Vital Signs: Ht 5\' 5"  (1.651 m)   Wt 195 lb (88.5 kg)   BMI 32.45 kg/m   Physical Exam   Constitutional: She is oriented to person, place, and time. She appears well-developed.  HENT:  Head: Normocephalic.  Right Ear: External ear normal.  Left Ear: External ear normal.  Eyes: Pupils are equal, round, and reactive to light.  Neck: No tracheal deviation present. No thyromegaly present.  Cardiovascular: Normal rate.  Pulmonary/Chest: Effort normal.  Abdominal: Soft.  Neurological: She is alert and oriented to person, place, and time.  Skin: Skin is warm and dry.  Psychiatric: She has a normal mood and affect. Her behavior is normal.    Ortho Exam well-healed right and left total knee arthroplasty incisions.  Patient has weakness hip flexors on the right.  Negative logroll of the hips with internal and external rotation.  She has some sciatic notch tenderness more on the right than left.  Left hip resisted flexion shows good strength.  She is tender over the right greater trochanter.  Distal pulses palpable.  Specialty Comments:  No specialty comments available.  Imaging: No results found.   PMFS History: Patient Active Problem List   Diagnosis Date Noted  . Trochanteric bursitis, right hip 09/14/2018  . Chronic bilateral low back pain 09/14/2018  . Hypertension, essential, benign 01/16/2018  . Sensorineural hearing loss (SNHL) of both ears 01/16/2018  . Insomnia 01/16/2018  .  MALT lymphoma ([place]) 01/15/2018  . H/O Cancer of vaginal vault ([place]) 01/15/2018  . Osteopenia of both hips 01/15/2018  . Hyperlipidemia 01/15/2018   Past Medical History:  Diagnosis Date  . Allergy   . Anxiety   . Arthritis   . Cancer [facility])    UTERINE CANCER  . GERD (gastroesophageal reflux disease)   . History of blood transfusion   . Hyperlipidemia   . Hypertension   . Insomnia   . Osteopenia    MILD  . Urine retention   . Uterine cancer ([place])    at age 43    Family History  Problem Relation Age of Onset  . Uterine cancer Sister   . COPD Maternal Grandfather   . Uterine  cancer Maternal Grandmother   . Colon cancer Neg Hx   . Esophageal cancer Neg Hx   . Liver cancer Neg Hx   . Pancreatic cancer Neg Hx   . Rectal cancer Neg Hx   . Stomach cancer Neg Hx     Past Surgical History:  Procedure Laterality Date  . ABDOMINAL HYSTERECTOMY     AGE 87  . APPENDECTOMY     AGE 77  . BLADDER SURGERY    . Catarct Surgery    . LAPAROSCOPIC SMALL BOWEL RESECTION    . NM PET DX LYMPHOMA  10/2018   IN REMISSION  . occular pressure    . OTHER SURGICAL HISTORY    . REPLACEMENT TOTAL KNEE BILATERAL    . TONSILLECTOMY AND ADENOIDECTOMY    . UTERINE CANCER SURGERY     Social History   Occupational History  . Not on file  Tobacco Use  . Smoking status: Never Smoker  . Smokeless tobacco: Never Used  Substance and Sexual Activity  . Alcohol use: Yes    Comment: one glass of wine with dinner  . Drug use: No  . Sexual activity: Never

## 2018-10-08 ENCOUNTER — Encounter (INDEPENDENT_AMBULATORY_CARE_PROVIDER_SITE_OTHER): Payer: Self-pay | Admitting: Orthopaedic Surgery

## 2018-10-15 HISTORY — PX: NM PET DX LYMPHOMA: HXRAD65

## 2018-10-20 ENCOUNTER — Non-Acute Institutional Stay: Payer: Medicare Other | Admitting: Internal Medicine

## 2018-10-20 ENCOUNTER — Encounter: Payer: Self-pay | Admitting: Internal Medicine

## 2018-10-20 VITALS — BP 120/80 | HR 54 | Temp 97.6°F | Ht 65.0 in | Wt 206.0 lb

## 2018-10-20 DIAGNOSIS — C52 Malignant neoplasm of vagina: Secondary | ICD-10-CM

## 2018-10-20 DIAGNOSIS — J302 Other seasonal allergic rhinitis: Secondary | ICD-10-CM | POA: Insufficient documentation

## 2018-10-20 DIAGNOSIS — F5101 Primary insomnia: Secondary | ICD-10-CM | POA: Diagnosis not present

## 2018-10-20 DIAGNOSIS — M7061 Trochanteric bursitis, right hip: Secondary | ICD-10-CM

## 2018-10-20 DIAGNOSIS — G8929 Other chronic pain: Secondary | ICD-10-CM | POA: Diagnosis not present

## 2018-10-20 DIAGNOSIS — M85851 Other specified disorders of bone density and structure, right thigh: Secondary | ICD-10-CM

## 2018-10-20 DIAGNOSIS — M545 Low back pain, unspecified: Secondary | ICD-10-CM

## 2018-10-20 DIAGNOSIS — E785 Hyperlipidemia, unspecified: Secondary | ICD-10-CM

## 2018-10-20 DIAGNOSIS — H903 Sensorineural hearing loss, bilateral: Secondary | ICD-10-CM

## 2018-10-20 DIAGNOSIS — C884 Extranodal marginal zone b-cell lymphoma of mucosa-associated lymphoid tissue (malt-lymphoma) not having achieved remission: Secondary | ICD-10-CM

## 2018-10-20 DIAGNOSIS — G4700 Insomnia, unspecified: Secondary | ICD-10-CM

## 2018-10-20 DIAGNOSIS — K520 Gastroenteritis and colitis due to radiation: Secondary | ICD-10-CM

## 2018-10-20 DIAGNOSIS — M85852 Other specified disorders of bone density and structure, left thigh: Secondary | ICD-10-CM

## 2018-10-20 DIAGNOSIS — I10 Essential (primary) hypertension: Secondary | ICD-10-CM

## 2018-10-20 MED ORDER — ESZOPICLONE 2 MG PO TABS: 2.0000 mg | ORAL_TABLET | Freq: Every day | ORAL | 3 refills | Status: DC

## 2018-10-20 MED ORDER — LOSARTAN POTASSIUM 50 MG PO TABS: 50.0000 mg | ORAL_TABLET | Freq: Every day | ORAL | 3 refills | Status: DC

## 2018-10-20 MED ORDER — ROSUVASTATIN CALCIUM 5 MG PO TABS: 5.0000 mg | ORAL_TABLET | Freq: Every day | ORAL | 3 refills | Status: DC

## 2018-10-20 NOTE — Progress Notes (Signed)
Provider:  Rexene Edison. Mariea Clonts, D.O., C.M.D. Location:  [facility] Customer service manager of Service:  Clinic (12)clinic  Previous PCP: Martinique, Betty G, MD Patient Care Team: Martinique, Betty G, MD as PCP - General (Family Medicine) Rolm Bookbinder, MD as Consulting Physician (Dermatology) Marybelle Killings, MD as Consulting Physician (Orthopedic Surgery) Jola Schmidt, MD as Consulting Physician (Ophthalmology) Milus Banister, MD as Attending Physician (Gastroenterology) Regal, Tamala Fothergill, DPM as Consulting Physician (Podiatry) Lucas Mallow, MD as Consulting Physician (Urology) Jodi Marble, MD as Consulting Physician (Otolaryngology)  Extended Emergency Contact Information Primary Emergency Contact: [name] Address: [address]          [place], [place] [address] [name] of [place] Phone: [phone] Relation: Son Secondary Emergency Contact: [name] Address: [address] [place] [address] [name] of [place] Mobile Phone: [phone] Relation: Son  Code Status:DNR--is not aware of having gold form.  Goals of Care: Advanced Directive information Advanced Directives 10/20/2018  Does Patient Have a Medical Advance Directive? Yes  Type of Advance Directive [place]  Does patient want to make changes to medical advance directive? No - Patient declined  Copy of [place] in Chart? Yes - validated most recent copy scanned in chart (See row information)    Chief Complaint  Patient presents with  . Establish Care    new patient--wants to meet me and decide if she wants to switch over    HPI: Patient is a 84 y.o. female seen today to establish with [facility].   [place]2 sons theree and one in [place].  Now has two in [place] and one in [place].  Now one in [place], one in [place] and one in [place].  [place]  one got a boat in [place].    She moved in last January.  She was at the end of her NHL treatment.  Had one more endoscopy to do in Jan/Feb.  It was clear.  Records have been requested from Dr. Betty Martinique.  Pt has a h/o allergies, arthritis, bursitis, uterine cancer, gerd, htn, hyperlipidemia, cured non-hodgkin's lymphoma, osteopenia and urinary retention.    She has two current concerns.   1.  She has pain in her right leg and into her buttocks.  She limps.  4 mos ago, she was told it was bursitis.  She got a shot that did not do anything.  She had 8 wks of PT in [place].  Was on walker, then cane.  Then got ortho referral.  Has seen Dr. Helen Hashimoto did xrays and it's L2-3.  He did a second injection in her hip for bursitis.  This has reduced her activity level. She has the MRI tomorrow.  advil does help her pain--she added 2 advil some days.  .    2.  She also has a compromised bowel and bladder.  Has always had some diarrhea issues, but it's escalated to where she has been having loose bms 3 times a week.  Can't count on her diarrhea not showing up.  These are also interfering with her function.  Other chronic problems-- Eyes:  Has ophtho--has high ocular pressure, but not glaucoma--uses drops.  Goes twice a year to Dr. Valetta Close.  Nose:  Has seasonal allergies.  Uses zyrtec generic daily year-round.  Not problematic.  Ears:  Has hearing aids for 3 years.  She was an audiologist and speech therapist.    She gets hoarse time to time.  Has been to ENT--Dr. Erik Obey.  He did a laryngoscopy.  Thyroid was normal.  Not reflux.  Pt says it's old age and her vocal cords are 60 like she is.  Nothing bad there.    She has arthritis head to toe for at least 15 yrs.  Has 500mg  tylenol up to 6 per day as needed in 24 hrs.   If she misses, she aches all over.  Was fine until the leg thing happened.  Has flat feet--all good except needs orthotics.  Had two knee replacements 8 and 5 years ago.  BP has generally  not been an issue. As the guidelines have changed, she's gradually been needing meds.  bp fluctuates quite a bit.  Is on losartan daily which has been effective.  Has had some episodes where it skyrockets.  When she had rehab with her second knee, her internist had to come see her b/c it dropped so low.    As far as she knows, her cholesterol is now controlled.  She has urinary retention.  She's had it for 15 years.  Had a cancer at 58 in her vagina.  Had surgery, then radiation.  It damaged her bladder and small bowel.  Had bladder repair from that damage.  She was like an open faucet after the radiation.  Saw a [employer] urinary specialist.  He tightened her back up.  She does I/O catheterization.   He tried to enlarge her bladder but could not due to the scar tissue.  Uses 7-8 catheters per day.  Gets 200 catheters per 30 days.  Her urologist in [place] had recommended an every other year kidney scan.  Gets abx to prevent UTIs from urologist--on for more than 15 years.  Bowels get hard as a rock and very painful if she has too much fiber--if she has none, she gets diarrhea.  She tries to avoid fiber.  Has not had an obstructed bowel for about 10 years.    Skin:  Cyst on right eye and skin tag on right hand.  Derm PA could not take them off here for her eyelid.    No longer needs colonoscopies, mammograms at 84 yo.  Was doing mammo and bone density every other year.  Was osteopenia.  She does not believe in those medications.  Last report said osteoporosis.  She requested to see the records.  It showed the drug company name on there.  They changed the test, but her result was not truly different from what she said.    Insomnia:  Laurel Dimmer for 10 years plus nightly.  Then could not get anymore due to age. She was changed to generic lunesta.  Was getting from mail order.    Past Medical History:  Diagnosis Date  . Allergy   . Anxiety   . Arthritis   . Bursitis    right leg  . Cancer [facility])     UTERINE CANCER  . GERD (gastroesophageal reflux disease)   . History of blood transfusion   . Hyperlipidemia   . Hypertension   . Insomnia   . Non Hodgkin's lymphoma ([place]) 2017-2019   cured  . Osteopenia    MILD  . Urine retention   . Uterine cancer ([place])    at age 76   Past Surgical History:  Procedure Laterality Date  . ABDOMINAL HYSTERECTOMY     AGE 22  .  APPENDECTOMY     AGE 48  . BLADDER SURGERY    . Catarct Surgery    . ECTOPIC PREGNANCY SURGERY     age 48  . LAPAROSCOPIC SMALL BOWEL RESECTION    . NM PET DX LYMPHOMA  10/2018   IN REMISSION  . occular pressure    . OTHER SURGICAL HISTORY    . REPLACEMENT TOTAL KNEE BILATERAL    . TONSILLECTOMY AND ADENOIDECTOMY     age 87 or 67  . UTERINE CANCER SURGERY     age 50- located in vagina    Social History   Socioeconomic History  . Marital status: Single    Spouse name: Not on file  . Number of children: Not on file  . Years of education: Not on file  . Highest education level: Not on file  Occupational History  . Not on file  Social Needs  . Financial resource strain: Not on file  . Food insecurity:    Worry: Not on file    Inability: Not on file  . Transportation needs:    Medical: Not on file    Non-medical: Not on file  Tobacco Use  . Smoking status: Never Smoker  . Smokeless tobacco: Never Used  Substance and Sexual Activity  . Alcohol use: Yes    Comment: one glass of wine with dinner  . Drug use: No  . Sexual activity: Never  Lifestyle  . Physical activity:    Days per week: Not on file    Minutes per session: Not on file  . Stress: Not on file  Relationships  . Social connections:    Talks on phone: Not on file    Gets together: Not on file    Attends religious service: Not on file    Active member of club or organization: Not on file    Attends meetings of clubs or organizations: Not on file    Relationship status: Not on file  Other Topics Concern  . Not on file  Social History  Narrative   Social History      Diet? Low to no fiber diet; lactose (no tolerance), low or no soy      Do you drink/eat things with caffeine? No liquid caffeine, occasional chocolate      Marital status?                divorced                    What year were you married? n/a      Do you live in a house, apartment, assisted living, condo, trailer, etc.? Apartment/ retirement      Is it one or more stories? one      How many persons live in your home? one      Do you have any pets in your home? (please list) no      Highest level of education completed? Master degree      Current or past profession: Audiologist/ Speech Therapist      Do you exercise?                 yes                     Type & how often? Water aerobics 3 x week      Advanced Directives      Do you have a living will? yes  Do you have a DNR form?        yes                         If not, do you want to discuss one?      Do you have signed POA/HPOA for forms? yes      Functional Status : completed by self      Do you have difficulty bathing or dressing yourself? no      Do you have difficulty preparing food or eating? no      Do you have difficulty managing your medications? No      Do you have difficulty managing your finances? no      Do you have difficulty affording your medications? no    reports that she has never smoked. She has never used smokeless tobacco. She reports that she drinks alcohol. She reports that she does not use drugs.  Functional Status Survey:    Family History  Problem Relation Age of Onset  . Uterine cancer Sister   . COPD Maternal Grandfather   . Heart failure Mother 71  . Pneumonia Father 43  . Dementia Father   . Uterine cancer Maternal Grandmother   . Other Sister        Guillain Barre syndrom  . Kidney cancer Son   . Dementia Paternal Grandmother   . Colon cancer Neg Hx   . Esophageal cancer Neg Hx   . Liver cancer Neg Hx   . Pancreatic cancer Neg Hx    . Rectal cancer Neg Hx   . Stomach cancer Neg Hx     Health Maintenance  Topic Date Due  . INFLUENZA VACCINE  Completed  . DEXA SCAN  Completed  . TETANUS/TDAP  Discontinued  . PNA vac Low Risk Adult  Discontinued    No Known Allergies  Outpatient Encounter Medications as of 10/20/2018  Medication Sig  . acetaminophen (TYLENOL) 500 MG tablet Take 500 mg by mouth every 6 (six) hours as needed.  . bimatoprost (LUMIGAN) 0.01 % SOLN Place 1 drop into both eyes at bedtime.  . brimonidine (ALPHAGAN) 0.15 % ophthalmic solution Place 1 drop into both eyes 3 (three) times daily.  . cetirizine (ZYRTEC) 10 MG tablet Take 10 mg by mouth daily.  . eszopiclone (LUNESTA) 2 MG TABS tablet TAKE 1 TABLET AT BEDTIME ASNEEDED FOR SLEEP  . loperamide (IMODIUM) 2 MG capsule Take by mouth.  . losartan (COZAAR) 50 MG tablet Take 1 tablet (50 mg total) by mouth daily.  . nitrofurantoin (MACRODANTIN) 50 MG capsule   . rosuvastatin (CRESTOR) 5 MG tablet Take 1 tablet (5 mg total) by mouth at bedtime.  . [DISCONTINUED] Catheters MISC by Does not apply route.   Facility-Administered Encounter Medications as of 10/20/2018  Medication  . 0.9 %  sodium chloride infusion    Review of Systems  Constitutional: Negative for chills, fever and malaise/fatigue.  HENT: Positive for hearing loss. Negative for sore throat.        Hoarseness  Eyes:       Glasses  Respiratory: Negative for cough and shortness of breath.   Cardiovascular: Negative for chest pain, palpitations and leg swelling.  Gastrointestinal: Positive for diarrhea. Negative for abdominal pain, blood in stool, constipation and melena.  Genitourinary:       Urinary retention--does I/O caths   Musculoskeletal: Positive for back pain, joint pain and myalgias. Negative for falls and neck pain.  Neurological: Negative for dizziness and loss of consciousness.  Psychiatric/Behavioral: Negative for depression and memory loss. The patient has insomnia.  The patient is not nervous/anxious.     Vitals:   10/20/18 1402  BP: 120/80  Pulse: (!) 54  Temp: 97.6 F (36.4 C)  TempSrc: Oral  SpO2: 96%  Weight: 206 lb (93.4 kg)  Height: 5\' 5"  (1.651 m)   Body mass index is 34.28 kg/m. Physical Exam  Constitutional: She is oriented to person, place, and time. She appears well-developed and well-nourished. No distress.  HENT:  Head: Normocephalic and atraumatic.  Right Ear: External ear normal.  Left Ear: External ear normal.  Nose: Nose normal.  Mouth/Throat: Oropharynx is clear and moist. No oropharyngeal exudate.  Hearing aids  Eyes: Pupils are equal, round, and reactive to light. Conjunctivae and EOM are normal.  Small cyst/tag on right upper lid; wears glasses  Neck: Normal range of motion. Neck supple. No JVD present. No tracheal deviation present. No thyromegaly present.  Cardiovascular: Normal rate, regular rhythm, normal heart sounds and intact distal pulses.  No murmur heard. Pulmonary/Chest: Effort normal and breath sounds normal. No respiratory distress. She has no wheezes. She has no rales.  Abdominal: Soft. Bowel sounds are normal. She exhibits no distension and no mass. There is no tenderness. There is no rebound and no guarding.  Musculoskeletal: She exhibits no edema or deformity.  Limping with right leg; tender over right lateral hip  Lymphadenopathy:    She has no cervical adenopathy.  Neurological: She is alert and oriented to person, place, and time. No cranial nerve deficit. She exhibits normal muscle tone. Coordination normal.  Could not get DTRs (bilateral knee replacements)  Skin: Skin is warm and dry. Capillary refill takes less than 2 seconds.  Skin tag on hand  Psychiatric: She has a normal mood and affect. Her behavior is normal. Judgment and thought content normal.    Labs reviewed: Basic Metabolic Panel: Recent Labs    05/14/18 1133 08/24/18 1111  NA 139 142  K 4.6 4.3  CL 102 104  CO2 29 31    GLUCOSE 88 80  BUN 21 17  CREATININE 0.73 0.68  CALCIUM 9.3 9.4   Liver Function Tests: Recent Labs    08/24/18 1111  AST 20  ALT 14  ALKPHOS 121*  BILITOT 0.4  PROT 6.7  ALBUMIN 4.2   Assessment/Plan 1. Trochanteric bursitis, right hip -s/p cortisone injection x 2 -had some improvement, but still limping so xrays were done of back by orthopedics and these suggest L2-3 DDD and bone spur   2. Chronic bilateral low back pain, unspecified whether sciatica present -is now for MRI from Dr. Lorin Mercy to evaluate for any stenosis or other nerve compression -? Piriformis role also in this pain -cont tylenol, topicals, heat/ice, discussed gabapentin due to radicular component in right thigh, but we will wait for MRI results--says she's waited this long  3. Hypertension, essential, benign - cont bp mgt with losartan--at goal - losartan (COZAAR) 50 MG tablet; Take 1 tablet (50 mg total) by mouth daily.  Dispense: 90 tablet; Refill: 3  4. Insomnia, unspecified type - educated on risks of sleeping pills at her age, but she reports being unable to sleep w/o it--used [place] for years before lunesta - eszopiclone (LUNESTA) 2 MG TABS tablet; Take 1 tablet (2 mg total) by mouth at bedtime. Take immediately before bedtime  Dispense: 90 tablet; Refill: 3--requested 90 day supply for convenience  5. MALT lymphoma (  [place]) -reports this was cleared--she refused radiation therapy as it was found incidentally and she does not believe she really had it  6. H/O Cancer of vaginal vault (H[facility]) -s/p surgery and radiation therapy; damaged her bladder and bowels and has chronic long term effects  7. Osteopenia of both hips -refuses further bone densities b/c she will NOT take any medications for it anyway, says she had a false postive test for osteoporosis while in [place]???  8.  Hyperlipidemia, unspecified hyperlipidemia type - cont to work on diet and hopefully get back to exercise as she desires when  her hip/back are in order -cont statin therapy: - rosuvastatin (CRESTOR) 5 MG tablet; Take 1 tablet (5 mg total) by mouth at bedtime.  Dispense: 90 tablet; Refill: 3  10. Sensorineural hearing loss (SNHL) of both ears - continue hearing aids  11. 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 Fallynn Gravett, D.O. [facility] [address], [place] [address] Cell Phone (Mon-Fri 8am-5pm):  [phone] On Call:  [phone] & follow prompts after 5pm & weekends Office Phone:  [phone] Office Fax:  [phone]

## 2018-10-21 ENCOUNTER — Ambulatory Visit
Admission: RE | Admit: 2018-10-21 | Discharge: 2018-10-21 | Disposition: A | Discharge status: Home / Self Care | Payer: Medicare Other | Source: Ambulatory Visit | Attending: Orthopaedic Surgery | Admitting: Orthopaedic Surgery

## 2018-10-21 DIAGNOSIS — G8929 Other chronic pain: Secondary | ICD-10-CM

## 2018-10-21 DIAGNOSIS — M545 Low back pain, unspecified: Secondary | ICD-10-CM

## 2018-10-21 DIAGNOSIS — M48061 Spinal stenosis, lumbar region without neurogenic claudication: Secondary | ICD-10-CM | POA: Diagnosis not present

## 2018-10-22 DIAGNOSIS — K520 Gastroenteritis and colitis due to radiation: Secondary | ICD-10-CM | POA: Insufficient documentation

## 2018-10-25 ENCOUNTER — Telehealth: Payer: Self-pay | Admitting: *Deleted

## 2018-10-25 DIAGNOSIS — M5410 Radiculopathy, site unspecified: Secondary | ICD-10-CM

## 2018-10-25 MED ORDER — GABAPENTIN 100 MG PO CAPS: 100.0000 mg | ORAL_CAPSULE | Freq: Every day | ORAL | 3 refills | Status: DC

## 2018-10-25 NOTE — Telephone Encounter (Signed)
Patient called and stated that she saw you last week and you recommended a pain medication. She stated that she is ready to start it and wants you to call it into [facility]. Please Advise.   (Can leave message on Voicemail)

## 2018-10-25 NOTE — Telephone Encounter (Signed)
Rx for gabapentin was sent to her pharmacy.

## 2018-11-03 ENCOUNTER — Encounter (INDEPENDENT_AMBULATORY_CARE_PROVIDER_SITE_OTHER): Payer: Self-pay | Admitting: Orthopaedic Surgery

## 2018-11-03 ENCOUNTER — Ambulatory Visit (INDEPENDENT_AMBULATORY_CARE_PROVIDER_SITE_OTHER): Payer: Medicare Other | Admitting: Orthopaedic Surgery

## 2018-11-03 ENCOUNTER — Ambulatory Visit (INDEPENDENT_AMBULATORY_CARE_PROVIDER_SITE_OTHER): Payer: Medicare Other

## 2018-11-03 VITALS — BP 140/71 | HR 73 | Ht 65.0 in | Wt 195.0 lb

## 2018-11-03 DIAGNOSIS — G8929 Other chronic pain: Secondary | ICD-10-CM | POA: Diagnosis not present

## 2018-11-03 DIAGNOSIS — M48061 Spinal stenosis, lumbar region without neurogenic claudication: Secondary | ICD-10-CM | POA: Diagnosis not present

## 2018-11-03 DIAGNOSIS — M545 Low back pain, unspecified: Secondary | ICD-10-CM

## 2018-11-03 NOTE — Progress Notes (Signed)
Office Visit Note   Patient: Linda [name] [id number]           Date of Birth: Jul 06, 1932           MRN: [id number] Visit Date: 11/03/2018              Requested by: Martinique, Betty G, MD [place], [place] [address] PCP: Gayland Curry, DO   Assessment & Plan: Visit Diagnoses:  1. Chronic bilateral low back pain, unspecified whether sciatica present   2. Spinal stenosis of lumbar region without neurogenic claudication     Plan: Patient's MRI scan is reviewed with patient and her husband.  Flexion-extension views are reviewed.  She has moderate stenosis at L4-5 with degenerative anterolisthesis with shifting less than 3 mm.  Foraminal narrowing at L3-4 L4-5 also at L5-S1.  We will refer her for an epidural right side with Dr. Ernestina Patches and he can decide which to levels to inject.  Plan to check her back after the epidurals.  I will recheck her in 4 weeks.  Follow-Up Instructions: Return in about 4 weeks (around 12/01/2018).   Orders:  Orders Placed This Encounter  Procedures  . XR Lumb Spine Flex&Ext Only  . Ambulatory referral to Physical Medicine Rehab   No orders of the defined types were placed in this encounter.     Procedures: No procedures performed   Clinical Data: No additional findings.   Subjective: Chief Complaint  Patient presents with  . Lower Back - Pain, Follow-up    MRI Lumbar Review    HPI 84 year old female returns with ongoing problems with back pain right leg pain.  Trochanteric injection 09/14/2018 did not give her any relief.  MRI scan has been obtained.  Positive history for lymphoma and uterine cancer no evidence of metastatic disease in lumbar spine.  Use gabapentin at night without relief 100 mg.   She is had symptoms for 5 weeks with pain with mobility.  She uses a heating pad which helps to some degree.  Previous total knee arthroplasty is done in [place].  Review of Systems unchanged 14 point system review from 09/14/2018 office  visit   Objective: Vital Signs: BP 140/71   Pulse 73   Ht 5\' 5"  (1.651 m)   Wt 195 lb (88.5 kg)   BMI 32.45 kg/m   Physical Exam  Constitutional: She is oriented to person, place, and time. She appears well-developed.  HENT:  Head: Normocephalic.  Right Ear: External ear normal.  Left Ear: External ear normal.  Eyes: Pupils are equal, round, and reactive to light.  Neck: No tracheal deviation present. No thyromegaly present.  Cardiovascular: Normal rate.  Pulmonary/Chest: Effort normal.  Abdominal: Soft.  Neurological: She is alert and oriented to person, place, and time.  Skin: Skin is warm and dry.  Psychiatric: She has a normal mood and affect. Her behavior is normal.    Ortho Exam bilateral total knee arthroplasties.  She has some hip flexion weakness on the right negative logroll to the hips.  Mild sciatic notch tenderness on the right negative on the left.  Anterior tib strong right and left.  Distal pulses are palpable negative [place] no rash over exposed skin no pitting edema.  Specialty Comments:  No specialty comments available.  Imaging: CLINICAL DATA:  Chronic bilateral low back pain.  EXAM: MRI LUMBAR SPINE WITHOUT CONTRAST  TECHNIQUE: Multiplanar, multisequence MR imaging of the lumbar spine was performed. No intravenous contrast was administered.  COMPARISON:  Radiography 09/14/2018  FINDINGS: Segmentation:  5 lumbar type vertebral bodies  Alignment: Mild dextrocurvature. Grade 1 anterolisthesis at L4-5 and slight retrolisthesis at L5-S1  Vertebrae:  No fracture, evidence of discitis, or bone lesion.  Conus medullaris and cauda equina: Conus extends to the L1 level. Conus and cauda equina appear normal.  Paraspinal and other soft tissues: Negative  Disc levels:  T12- L1: Shallow protrusion on sagittal imaging.  No impingement  L1-L2: Disc narrowing and bulging.  Negative facets.  No impingement  L2-L3: Advanced disc narrowing  with mild bulging and endplate ridging. Mild posterior element hypertrophy. No impingement  L3-L4: Disc narrowing and bulging. Degenerative facet spurring. Patent spinal canal. There is moderate right foraminal narrowing. Patent left foramen.  L4-L5: Advanced facet arthropathy with spurring and anterolisthesis. Mild to moderate spinal stenosis. Bilateral subarticular recess effacement but the descending L5 nerve roots are medial and non-compressed. Noncompressive bilateral foraminal narrowing  L5-S1:Disc narrowing and ridging with bulge. Mild facet spurring. Moderate to advanced right foraminal stenosis. Noncompressive left foraminal narrowing.  IMPRESSION: 1. Diffuse disc and facet degeneration with L4-5 anterolisthesis. 2. L4-5 mild to moderate spinal stenosis. 3. Diffuse foraminal narrowing that is moderate to advanced on the right at L5-S1 and moderate on the right at L3-4.   Electronically Signed   By: Monte Fantasia M.D.   On: 10/21/2018 15:45   PMFS History: Patient Active Problem List   Diagnosis Date Noted  . 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 Ocular hypertension 10/20/2018  . Seasonal allergies 10/20/2018  . Trochanteric bursitis, right hip 09/14/2018  . Chronic bilateral low back pain 09/14/2018  . Hoarseness of voice 04/08/2018  . Hypertension, essential, benign 01/16/2018  . Sensorineural hearing loss (SNHL) of both ears 01/16/2018  . Insomnia 01/16/2018  . MALT lymphoma ([place]) 01/15/2018  . H/O Cancer of vaginal vault ([place]) 01/15/2018  . Osteopenia of both hips 01/15/2018  . Hyperlipidemia 01/15/2018  . DJD (degenerative joint disease) of knee 02/13/2011  . H/O difficult intubation 02/12/2011  . Dry eye syndrome 04/10/2010  . Incomplete emptying of bladder 08/31/2009  . Borderline glaucoma with ocular hypertension 09/07/2008   Past Medical History:  Diagnosis Date  . Allergy   . Anxiety   . Arthritis   . Bursitis    right leg  . GERD  (gastroesophageal reflux disease)   . History of blood transfusion   . Hyperlipidemia   . Hypertension   . Insomnia   . Non Hodgkin's lymphoma ([place]) 2017-2019   cured  . Osteopenia    MILD  . Urine retention   . Uterine cancer ([place])    at age 36    Family History  Problem Relation Age of Onset  . Uterine cancer Sister   . COPD Maternal Grandfather   . Heart failure Mother 14  . Pneumonia Father 71  . Dementia Father   . Uterine cancer Maternal Grandmother   . Other Sister        Guillain Barre syndrom  . Kidney cancer Son   . Dementia Paternal Grandmother   . Colon cancer Neg Hx   . Esophageal cancer Neg Hx   . Liver cancer Neg Hx   . Pancreatic cancer Neg Hx   . Rectal cancer Neg Hx   . Stomach cancer Neg Hx     Past Surgical History:  Procedure Laterality Date  . ABDOMINAL HYSTERECTOMY     AGE 29  . APPENDECTOMY     AGE 47  . BLADDER  SURGERY    . Catarct Surgery    . ECTOPIC PREGNANCY SURGERY     age 17  . LAPAROSCOPIC SMALL BOWEL RESECTION    . NM PET DX LYMPHOMA  10/2018   IN REMISSION  . occular pressure    . OTHER SURGICAL HISTORY    . REPLACEMENT TOTAL KNEE BILATERAL    . TONSILLECTOMY AND ADENOIDECTOMY     age 52 or 59  . UTERINE CANCER SURGERY     age 46- located in vagina   Social History   Occupational History  . Not on file  Tobacco Use  . Smoking status: Never Smoker  . Smokeless tobacco: Never Used  Substance and Sexual Activity  . Alcohol use: Yes    Comment: one glass of wine with dinner  . Drug use: No  . Sexual activity: Never

## 2018-11-04 ENCOUNTER — Encounter (INDEPENDENT_AMBULATORY_CARE_PROVIDER_SITE_OTHER): Payer: Self-pay | Admitting: Orthopaedic Surgery

## 2018-11-08 ENCOUNTER — Telehealth: Payer: Self-pay | Admitting: *Deleted

## 2018-11-08 ENCOUNTER — Telehealth (INDEPENDENT_AMBULATORY_CARE_PROVIDER_SITE_OTHER): Payer: Self-pay | Admitting: Physical Medicine and Rehabilitation

## 2018-11-08 DIAGNOSIS — M5410 Radiculopathy, site unspecified: Secondary | ICD-10-CM

## 2018-11-08 MED ORDER — GABAPENTIN 100 MG PO CAPS: ORAL_CAPSULE | ORAL | 1 refills | Status: DC

## 2018-11-08 NOTE — Telephone Encounter (Signed)
[place] with me.  Would send a 90 day supply of the gabapentin 200mg  po daily.

## 2018-11-08 NOTE — Telephone Encounter (Signed)
Patient notified and Rx updated and faxed to pharmacy.

## 2018-11-08 NOTE — Telephone Encounter (Signed)
Patient called and stated that she needed a refill on her Gabapentin. Stated that she was taking 100mg  once daily but it was not helping much so her Orthopaedic Surgeon increased it to 200mg  daily at her OV with him last week. Patient is requesting a Rx for 200mg  to be sent to pharmacy. Please Advise.

## 2018-11-09 ENCOUNTER — Other Ambulatory Visit (INDEPENDENT_AMBULATORY_CARE_PROVIDER_SITE_OTHER): Payer: Self-pay | Admitting: Physical Medicine and Rehabilitation

## 2018-11-09 DIAGNOSIS — F411 Generalized anxiety disorder: Secondary | ICD-10-CM

## 2018-11-09 MED ORDER — DIAZEPAM 5 MG PO TABS: ORAL_TABLET | ORAL | 0 refills | Status: DC

## 2018-11-09 NOTE — Telephone Encounter (Signed)
Sent valium

## 2018-11-09 NOTE — Progress Notes (Signed)
Pre-procedure diazepam ordered for pre-operative anxiety.  

## 2018-11-17 ENCOUNTER — Ambulatory Visit (INDEPENDENT_AMBULATORY_CARE_PROVIDER_SITE_OTHER): Payer: Self-pay

## 2018-11-17 ENCOUNTER — Encounter

## 2018-11-17 ENCOUNTER — Encounter (INDEPENDENT_AMBULATORY_CARE_PROVIDER_SITE_OTHER): Payer: Self-pay | Admitting: Physical Medicine and Rehabilitation

## 2018-11-17 ENCOUNTER — Ambulatory Visit (INDEPENDENT_AMBULATORY_CARE_PROVIDER_SITE_OTHER): Payer: Medicare Other | Admitting: Physical Medicine and Rehabilitation

## 2018-11-17 VITALS — BP 148/63 | HR 52 | Temp 97.9°F

## 2018-11-17 DIAGNOSIS — M5416 Radiculopathy, lumbar region: Secondary | ICD-10-CM | POA: Diagnosis not present

## 2018-11-17 MED ORDER — BETAMETHASONE SOD PHOS & ACET 6 (3-3) MG/ML IJ SUSP
12.0000 mg | Freq: Once | INTRAMUSCULAR | Status: AC
  Administered 2018-11-17: 12 mg

## 2018-11-17 NOTE — Progress Notes (Signed)
 .  Numeric Pain Rating Scale and Functional Assessment Average Pain 5   In the last MONTH (on 0-10 scale) has pain interfered with the following?  1. General activity like being  able to carry out your everyday physical activities such as walking, climbing stairs, carrying groceries, or moving a chair?  Rating(4)   +Driver, -BT, -Dye Allergies.  

## 2018-11-17 NOTE — Patient Instructions (Signed)

## 2018-11-18 NOTE — Progress Notes (Signed)
Linda [name] LOVFIEPPI - 84 y.o. female MRN [id number]  Date of birth: 12/19/1931  Office Visit Note: Visit Date: 11/17/2018 PCP: Gayland Curry, DO Referred by: Gayland Curry, DO  Subjective: Chief Complaint  Patient presents with  . Lower Back - Pain  . Right Thigh - Pain   HPI:  Linda [name] is a 84 y.o. female who comes in today For planned right L4 transforaminal epidural steroid injections request of Dr. Rodell Perna.  MRI of the lumbar spine has been performed and reviewed today.  Patient is present with her daughter provides some history.  She is having right radicular leg pain that does cross from the low back anteriorly across the knee and somewhat of an L4 distribution.  She does have listhesis of L4 on L5 with facet arthritis and some foraminal lateral recess narrowing without frank compression.  ROS Otherwise per HPI.  Assessment & Plan: Visit Diagnoses:  1. Lumbar radiculopathy     Plan: No additional findings.   Meds & Orders:  Meds ordered this encounter  Medications  . betamethasone acetate-betamethasone sodium phosphate (CELESTONE) injection 12 mg    Orders Placed This Encounter  Procedures  . XR C-ARM NO REPORT  . Epidural Steroid injection    Follow-up: No follow-ups on file.   Procedures: No procedures performed  Lumbosacral Transforaminal Epidural Steroid Injection - Sub-Pedicular Approach with Fluoroscopic Guidance  Patient: Linda [name] [id number]      Date of Birth: 1932/12/01 MRN: [id number] PCP: Gayland Curry, DO      Visit Date: 11/17/2018   Universal Protocol:    Date/Time: 11/17/2018  Consent Given By: the patient  Position: PRONE  Additional Comments: Vital signs were monitored before and after the procedure. Patient was prepped and draped in the usual sterile fashion. The correct patient, procedure, and site was verified.   Injection Procedure Details:  Procedure Site One Meds Administered:  Meds ordered this encounter    Medications  . betamethasone acetate-betamethasone sodium phosphate (CELESTONE) injection 12 mg    Laterality: Right  Location/Site:  L4-L5  Needle size: 22 G  Needle type: Spinal  Needle Placement: Transforaminal  Findings:    -Comments: Excellent flow of contrast along the nerve and into the epidural space.  Procedure Details: After squaring off the end-plates to get a true AP view, the C-arm was positioned so that an oblique view of the foramen as noted above was visualized. The target area is just inferior to the "nose of the scotty dog" or sub pedicular. The soft tissues overlying this structure were infiltrated with 2-3 ml. of 1% Lidocaine without Epinephrine.  The spinal needle was inserted toward the target using a "trajectory" view along the fluoroscope beam.  Under AP and lateral visualization, the needle was advanced so it did not puncture dura and was located close the 6 O'Clock position of the pedical in AP tracterory. Biplanar projections were used to confirm position. Aspiration was confirmed to be negative for CSF and/or blood. A 1-2 ml. volume of Isovue-250 was injected and flow of contrast was noted at each level. Radiographs were obtained for documentation purposes.   After attaining the desired flow of contrast documented above, a 0.5 to 1.0 ml test dose of 0.25% Marcaine was injected into each respective transforaminal space.  The patient was observed for 90 seconds post injection.  After no sensory deficits were reported, and normal lower extremity motor function was noted,   the above injectate was administered so that  equal amounts of the injectate were placed at each foramen (level) into the transforaminal epidural space.   Additional Comments:  The patient tolerated the procedure well Dressing: Band-Aid    Post-procedure details: Patient was observed during the procedure. Post-procedure instructions were reviewed.  Patient left the clinic in stable  condition.    Clinical History: MRI LUMBAR SPINE WITHOUT CONTRAST  TECHNIQUE: Multiplanar, multisequence MR imaging of the lumbar spine was performed. No intravenous contrast was administered.  COMPARISON:  Radiography 09/14/2018  FINDINGS: Segmentation:  5 lumbar type vertebral bodies  Alignment: Mild dextrocurvature. Grade 1 anterolisthesis at L4-5 and slight retrolisthesis at L5-S1  Vertebrae:  No fracture, evidence of discitis, or bone lesion.  Conus medullaris and cauda equina: Conus extends to the L1 level. Conus and cauda equina appear normal.  Paraspinal and other soft tissues: Negative  Disc levels:  T12- L1: Shallow protrusion on sagittal imaging.  No impingement  L1-L2: Disc narrowing and bulging.  Negative facets.  No impingement  L2-L3: Advanced disc narrowing with mild bulging and endplate ridging. Mild posterior element hypertrophy. No impingement  L3-L4: Disc narrowing and bulging. Degenerative facet spurring. Patent spinal canal. There is moderate right foraminal narrowing. Patent left foramen.  L4-L5: Advanced facet arthropathy with spurring and anterolisthesis. Mild to moderate spinal stenosis. Bilateral subarticular recess effacement but the descending L5 nerve roots are medial and non-compressed. Noncompressive bilateral foraminal narrowing  L5-S1:Disc narrowing and ridging with bulge. Mild facet spurring. Moderate to advanced right foraminal stenosis. Noncompressive left foraminal narrowing.  IMPRESSION: 1. Diffuse disc and facet degeneration with L4-5 anterolisthesis. 2. L4-5 mild to moderate spinal stenosis. 3. Diffuse foraminal narrowing that is moderate to advanced on the right at L5-S1 and moderate on the right at L3-4.   Electronically Signed   By: Monte Fantasia M.D.   On: 10/21/2018 15:45     Objective:  VS:  HT:    WT:   BMI:     BP:(!) 148/63  HR:(!) 52bpm  TEMP:97.9 F (36.6 C)(Oral)  RESP:    Physical Exam  Ortho Exam Imaging: Xr C-arm No Report  Result Date: 11/17/2018 Please see Notes tab for imaging impression.

## 2018-11-18 NOTE — Procedures (Signed)
Lumbosacral Transforaminal Epidural Steroid Injection - Sub-Pedicular Approach with Fluoroscopic Guidance  Patient: Linda [name] [id number]      Date of Birth: 1932-07-02 MRN: [id number] PCP: Gayland Curry, DO      Visit Date: 11/17/2018   Universal Protocol:    Date/Time: 11/17/2018  Consent Given By: the patient  Position: PRONE  Additional Comments: Vital signs were monitored before and after the procedure. Patient was prepped and draped in the usual sterile fashion. The correct patient, procedure, and site was verified.   Injection Procedure Details:  Procedure Site One Meds Administered:  Meds ordered this encounter  Medications  . betamethasone acetate-betamethasone sodium phosphate (CELESTONE) injection 12 mg    Laterality: Right  Location/Site:  L4-L5  Needle size: 22 G  Needle type: Spinal  Needle Placement: Transforaminal  Findings:    -Comments: Excellent flow of contrast along the nerve and into the epidural space.  Procedure Details: After squaring off the end-plates to get a true AP view, the C-arm was positioned so that an oblique view of the foramen as noted above was visualized. The target area is just inferior to the "nose of the scotty dog" or sub pedicular. The soft tissues overlying this structure were infiltrated with 2-3 ml. of 1% Lidocaine without Epinephrine.  The spinal needle was inserted toward the target using a "trajectory" view along the fluoroscope beam.  Under AP and lateral visualization, the needle was advanced so it did not puncture dura and was located close the 6 O'Clock position of the pedical in AP tracterory. Biplanar projections were used to confirm position. Aspiration was confirmed to be negative for CSF and/or blood. A 1-2 ml. volume of Isovue-250 was injected and flow of contrast was noted at each level. Radiographs were obtained for documentation purposes.   After attaining the desired flow of contrast documented above, a 0.5 to  1.0 ml test dose of 0.25% Marcaine was injected into each respective transforaminal space.  The patient was observed for 90 seconds post injection.  After no sensory deficits were reported, and normal lower extremity motor function was noted,   the above injectate was administered so that equal amounts of the injectate were placed at each foramen (level) into the transforaminal epidural space.   Additional Comments:  The patient tolerated the procedure well Dressing: Band-Aid    Post-procedure details: Patient was observed during the procedure. Post-procedure instructions were reviewed.  Patient left the clinic in stable condition.

## 2018-11-19 ENCOUNTER — Encounter (INDEPENDENT_AMBULATORY_CARE_PROVIDER_SITE_OTHER): Payer: Self-pay | Admitting: Physical Medicine and Rehabilitation

## 2018-12-03 ENCOUNTER — Encounter (INDEPENDENT_AMBULATORY_CARE_PROVIDER_SITE_OTHER): Payer: Self-pay | Admitting: Orthopaedic Surgery

## 2018-12-03 ENCOUNTER — Ambulatory Visit (INDEPENDENT_AMBULATORY_CARE_PROVIDER_SITE_OTHER): Payer: Medicare Other | Admitting: Orthopaedic Surgery

## 2018-12-03 VITALS — BP 126/47 | HR 58 | Ht 64.5 in | Wt 195.0 lb

## 2018-12-03 DIAGNOSIS — M48061 Spinal stenosis, lumbar region without neurogenic claudication: Secondary | ICD-10-CM | POA: Insufficient documentation

## 2018-12-03 NOTE — Progress Notes (Signed)
Office Visit Note   Patient: Linda [name] [id number]           Date of Birth: 1932-12-06           MRN: [id number] Visit Date: 12/03/2018              Requested by: Gayland Curry, DO [place], [place] [address] PCP: Gayland Curry, DO   Assessment & Plan: Visit Diagnoses:  1. Spinal stenosis of lumbar region without neurogenic claudication   2. Lumbar foraminal stenosis     Plan: Patient has some anterolisthesis at L4-5 with foraminal stenosis.  She is gotten greater than 80% pain relief with the injection and is happy with the result of the two-level foraminal injection.  She will call if she has further problems.  Her son is with her today and we again reviewed the MRI scan and discussed the disc degeneration foraminal stenosis mild central stenosis with anterolisthesis at the L4-5 level.  Follow-Up Instructions: No follow-ups on file.   Orders:  No orders of the defined types were placed in this encounter.  No orders of the defined types were placed in this encounter.     Procedures: No procedures performed   Clinical Data: No additional findings.   Subjective: No chief complaint on file.   HPI 84 year old female returns with 80% plus relief from the lumbar foraminal injections on the right by Dr. Ernestina Patches 11/17/2018.  She still has some problems with lifting her right leg when she goes up a step.  On flat ground she is walking well and states pain is significantly improved.  No bowel bladder symptoms no chills or fever she tolerated the epidural well.  Review of Systems updated unchanged last office visit 14 point.   Objective: Vital Signs: BP (!) 126/47   Pulse (!) 58   Ht 5' 4.5" (1.638 m)   Wt 195 lb (88.5 kg)   BMI 32.95 kg/m   Physical Exam Constitutional:      Appearance: She is well-developed.  HENT:     Head: Normocephalic.     Right Ear: External ear normal.     Left Ear: External ear normal.  Eyes:     Pupils: Pupils are equal, round,  and reactive to light.  Neck:     Thyroid: No thyromegaly.     Trachea: No tracheal deviation.  Cardiovascular:     Rate and Rhythm: Normal rate.  Pulmonary:     Effort: Pulmonary effort is normal.  Abdominal:     Palpations: Abdomen is soft.  Skin:    General: Skin is warm and dry.  Neurological:     Mental Status: She is alert and oriented to person, place, and time.  Psychiatric:        Behavior: Behavior normal.     Ortho Exam bilateral total knee arthroplasty is doing well.  She still has limitation of right hip internal rotation 20 degrees with mild discomfort.  She not able to figure 4 on the right still has right hip flexion weakness negative on the left.  Distal pulses are palpable she is walking much better and gets from sitting to standing comfortably.  Specialty Comments:  No specialty comments available.  Imaging: No results found.   PMFS History: Patient Active Problem List   Diagnosis Date Noted  . Spinal stenosis of lumbar region without neurogenic claudication 12/03/2018  . Lumbar foraminal stenosis 12/03/2018  . 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 Ocular  hypertension 10/20/2018  . Seasonal allergies 10/20/2018  . Trochanteric bursitis, right hip 09/14/2018  . Chronic bilateral low back pain 09/14/2018  . Hoarseness of voice 04/08/2018  . Hypertension, essential, benign 01/16/2018  . Sensorineural hearing loss (SNHL) of both ears 01/16/2018  . Insomnia 01/16/2018  . MALT lymphoma ([facility]) 01/15/2018  . 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 Osteopenia of both hips 01/15/2018  . Hyperlipidemia 01/15/2018  . DJD (degenerative joint disease) of knee 02/13/2011  . H/O difficult intubation 02/12/2011  . Dry eye syndrome 04/10/2010  . Incomplete emptying of bladder 08/31/2009  . Borderline glaucoma with ocular hypertension 09/07/2008   Past Medical History:  Diagnosis Date  . Allergy   . Anxiety   . Arthritis   . Bursitis    right leg  .  GERD (gastroesophageal reflux disease)   . History of blood transfusion   . Hyperlipidemia   . Hypertension   . Insomnia   . Non Hodgkin's lymphoma ([facility]) 2017-2019   cured  . Osteopenia    MILD  . Urine retention   . Uterine cancer ([facility])    at age 89    Family History  Problem Relation Age of Onset  . Uterine cancer Sister   . COPD Maternal Grandfather   . Heart failure Mother 72  . Pneumonia Father 34  . Dementia Father   . Uterine cancer Maternal Grandmother   . Other Sister        Guillain Barre syndrom  . Kidney cancer Son   . Dementia Paternal Grandmother   . Colon cancer Neg Hx   . Esophageal cancer Neg Hx   . Liver cancer Neg Hx   . Pancreatic cancer Neg Hx   . Rectal cancer Neg Hx   . Stomach cancer Neg Hx     Past Surgical History:  Procedure Laterality Date  . ABDOMINAL HYSTERECTOMY     AGE 69  . APPENDECTOMY     AGE 36  . BLADDER SURGERY    . Catarct Surgery    . ECTOPIC PREGNANCY SURGERY     age 27  . LAPAROSCOPIC SMALL BOWEL RESECTION    . NM PET DX LYMPHOMA  10/2018   IN REMISSION  . occular pressure    . OTHER SURGICAL HISTORY    . REPLACEMENT TOTAL KNEE BILATERAL    . TONSILLECTOMY AND ADENOIDECTOMY     age 24 or 63  . UTERINE CANCER SURGERY     age 45- located in vagina   Social History   Occupational History  . Not on file  Tobacco Use  . Smoking status: Never Smoker  . Smokeless tobacco: Never Used  Substance and Sexual Activity  . Alcohol use: Yes    Comment: one glass of wine with dinner  . Drug use: No  . Sexual activity: Never

## 2018-12-12 DIAGNOSIS — Z743 Need for continuous supervision: Secondary | ICD-10-CM | POA: Diagnosis not present

## 2018-12-12 DIAGNOSIS — R55 Syncope and collapse: Secondary | ICD-10-CM | POA: Diagnosis not present

## 2018-12-12 DIAGNOSIS — K529 Noninfective gastroenteritis and colitis, unspecified: Secondary | ICD-10-CM | POA: Diagnosis not present

## 2018-12-12 DIAGNOSIS — I959 Hypotension, unspecified: Secondary | ICD-10-CM | POA: Diagnosis not present

## 2018-12-12 DIAGNOSIS — Z8544 Personal history of malignant neoplasm of other female genital organs: Secondary | ICD-10-CM | POA: Diagnosis not present

## 2018-12-12 DIAGNOSIS — E860 Dehydration: Secondary | ICD-10-CM | POA: Diagnosis not present

## 2018-12-12 DIAGNOSIS — R110 Nausea: Secondary | ICD-10-CM | POA: Diagnosis not present

## 2018-12-12 DIAGNOSIS — I10 Essential (primary) hypertension: Secondary | ICD-10-CM | POA: Diagnosis not present

## 2018-12-12 DIAGNOSIS — K909 Intestinal malabsorption, unspecified: Secondary | ICD-10-CM | POA: Diagnosis not present

## 2018-12-14 ENCOUNTER — Observation Stay (HOSPITAL_COMMUNITY)
Admission: EM | Admit: 2018-12-14 | Discharge: 2018-12-16 | Disposition: A | Discharge status: Skilled Nursing Facility | Payer: Medicare Other | Attending: Internal Medicine | Admitting: Internal Medicine

## 2018-12-14 ENCOUNTER — Emergency Department (HOSPITAL_COMMUNITY): Payer: Medicare Other

## 2018-12-14 ENCOUNTER — Encounter (HOSPITAL_COMMUNITY): Payer: Self-pay | Admitting: Emergency Medicine

## 2018-12-14 ENCOUNTER — Other Ambulatory Visit: Payer: Self-pay

## 2018-12-14 DIAGNOSIS — Z87440 Personal history of urinary (tract) infections: Secondary | ICD-10-CM

## 2018-12-14 DIAGNOSIS — R5383 Other fatigue: Secondary | ICD-10-CM | POA: Diagnosis not present

## 2018-12-14 DIAGNOSIS — Z79899 Other long term (current) drug therapy: Secondary | ICD-10-CM | POA: Diagnosis not present

## 2018-12-14 DIAGNOSIS — Z8542 Personal history of malignant neoplasm of other parts of uterus: Secondary | ICD-10-CM | POA: Diagnosis not present

## 2018-12-14 DIAGNOSIS — R339 Retention of urine, unspecified: Secondary | ICD-10-CM | POA: Insufficient documentation

## 2018-12-14 DIAGNOSIS — I10 Essential (primary) hypertension: Secondary | ICD-10-CM | POA: Diagnosis present

## 2018-12-14 DIAGNOSIS — R1111 Vomiting without nausea: Secondary | ICD-10-CM | POA: Diagnosis not present

## 2018-12-14 DIAGNOSIS — E785 Hyperlipidemia, unspecified: Secondary | ICD-10-CM | POA: Insufficient documentation

## 2018-12-14 DIAGNOSIS — Z8572 Personal history of non-Hodgkin lymphomas: Secondary | ICD-10-CM | POA: Insufficient documentation

## 2018-12-14 DIAGNOSIS — R05 Cough: Secondary | ICD-10-CM | POA: Diagnosis not present

## 2018-12-14 DIAGNOSIS — J181 Lobar pneumonia, unspecified organism: Secondary | ICD-10-CM | POA: Diagnosis not present

## 2018-12-14 DIAGNOSIS — R0902 Hypoxemia: Secondary | ICD-10-CM | POA: Diagnosis not present

## 2018-12-14 DIAGNOSIS — J189 Pneumonia, unspecified organism: Secondary | ICD-10-CM | POA: Diagnosis present

## 2018-12-14 DIAGNOSIS — R0602 Shortness of breath: Secondary | ICD-10-CM | POA: Diagnosis not present

## 2018-12-14 DIAGNOSIS — R509 Fever, unspecified: Secondary | ICD-10-CM | POA: Diagnosis present

## 2018-12-14 LAB — RESPIRATORY PANEL BY PCR
Adenovirus: NOT DETECTED
Bordetella pertussis: NOT DETECTED
CORONAVIRUS OC43-RVPPCR: NOT DETECTED
Chlamydophila pneumoniae: NOT DETECTED
Coronavirus 229E: NOT DETECTED
Coronavirus HKU1: NOT DETECTED
Coronavirus NL63: NOT DETECTED
Influenza A: NOT DETECTED
Influenza B: NOT DETECTED
Metapneumovirus: NOT DETECTED
Mycoplasma pneumoniae: NOT DETECTED
Parainfluenza Virus 1: NOT DETECTED
Parainfluenza Virus 2: NOT DETECTED
Parainfluenza Virus 3: NOT DETECTED
Parainfluenza Virus 4: NOT DETECTED
Respiratory Syncytial Virus: NOT DETECTED
Rhinovirus / Enterovirus: NOT DETECTED

## 2018-12-14 LAB — CBC WITH DIFFERENTIAL/PLATELET
Abs Immature Granulocytes: 0.01 10*3/uL (ref 0.00–0.07)
Basophils Absolute: 0 10*3/uL (ref 0.0–0.1)
Basophils Relative: 0 %
Eosinophils Absolute: 0.1 10*3/uL (ref 0.0–0.5)
Eosinophils Relative: 1 %
HCT: 38 % (ref 36.0–46.0)
Hemoglobin: 11.3 g/dL — ABNORMAL LOW (ref 12.0–15.0)
Immature Granulocytes: 0 %
LYMPHS ABS: 0.6 10*3/uL — AB (ref 0.7–4.0)
Lymphocytes Relative: 10 %
MCH: 29.4 pg (ref 26.0–34.0)
MCHC: 29.7 g/dL — ABNORMAL LOW (ref 30.0–36.0)
MCV: 98.7 fL (ref 80.0–100.0)
Monocytes Absolute: 0.4 10*3/uL (ref 0.1–1.0)
Monocytes Relative: 7 %
Neutro Abs: 4.5 10*3/uL (ref 1.7–7.7)
Neutrophils Relative %: 82 %
Platelets: 168 10*3/uL (ref 150–400)
RBC: 3.85 MIL/uL — ABNORMAL LOW (ref 3.87–5.11)
RDW: 12.4 % (ref 11.5–15.5)
WBC: 5.6 10*3/uL (ref 4.0–10.5)
nRBC: 0 % (ref 0.0–0.2)

## 2018-12-14 LAB — COMPREHENSIVE METABOLIC PANEL
ALK PHOS: 83 U/L (ref 38–126)
ALT: 17 U/L (ref 0–44)
AST: 29 U/L (ref 15–41)
Albumin: 3.4 g/dL — ABNORMAL LOW (ref 3.5–5.0)
Anion gap: 8 (ref 5–15)
BUN: 12 mg/dL (ref 8–23)
CALCIUM: 8.8 mg/dL — AB (ref 8.9–10.3)
CO2: 26 mmol/L (ref 22–32)
Chloride: 108 mmol/L (ref 98–111)
Creatinine, Ser: 0.78 mg/dL (ref 0.44–1.00)
GFR calc non Af Amer: >60 mL/min (ref >60)
Glucose, Bld: 102 mg/dL — ABNORMAL HIGH (ref 70–99)
Potassium: 4.4 mmol/L (ref 3.5–5.1)
Sodium: 142 mmol/L (ref 135–145)
TOTAL PROTEIN: 6 g/dL — AB (ref 6.5–8.1)
Total Bilirubin: 1.1 mg/dL (ref 0.3–1.2)

## 2018-12-14 LAB — INFLUENZA PANEL BY PCR (TYPE A & B)
Influenza A By PCR: NEGATIVE
Influenza B By PCR: NEGATIVE

## 2018-12-14 LAB — URINALYSIS, ROUTINE W REFLEX MICROSCOPIC
Bacteria, UA: NONE SEEN
Bilirubin Urine: NEGATIVE
Glucose, UA: NEGATIVE mg/dL
Ketones, ur: 5 mg/dL — AB
Nitrite: NEGATIVE
PH: 5 (ref 5.0–8.0)
Protein, ur: NEGATIVE mg/dL
Specific Gravity, Urine: 1.014 (ref 1.005–1.030)

## 2018-12-14 LAB — STREP PNEUMONIAE URINARY ANTIGEN: STREP PNEUMO URINARY ANTIGEN: NEGATIVE

## 2018-12-14 LAB — LIPASE, BLOOD: Lipase: 19 U/L (ref 11–51)

## 2018-12-14 MED ORDER — ACETAMINOPHEN 325 MG PO TABS
650.0000 mg | ORAL_TABLET | Freq: Once | ORAL | Status: AC
  Administered 2018-12-14: 650 mg via ORAL
  Filled 2018-12-14: qty 2

## 2018-12-14 MED ORDER — LOSARTAN POTASSIUM 50 MG PO TABS
50.0000 mg | ORAL_TABLET | Freq: Every day | ORAL | Status: DC
  Administered 2018-12-14 – 2018-12-16 (×3): 50 mg via ORAL
  Filled 2018-12-14 (×3): qty 1

## 2018-12-14 MED ORDER — AZITHROMYCIN 250 MG PO TABS
500.0000 mg | ORAL_TABLET | Freq: Once | ORAL | Status: AC
  Administered 2018-12-14: 500 mg via ORAL
  Filled 2018-12-14: qty 2

## 2018-12-14 MED ORDER — SODIUM CHLORIDE 0.9 % IV SOLN
1.0000 g | Freq: Once | INTRAVENOUS | Status: AC
  Administered 2018-12-14: 1 g via INTRAVENOUS
  Filled 2018-12-14: qty 10

## 2018-12-14 MED ORDER — LORATADINE 10 MG PO TABS
10.0000 mg | ORAL_TABLET | Freq: Every day | ORAL | Status: DC
  Administered 2018-12-14 – 2018-12-16 (×3): 10 mg via ORAL
  Filled 2018-12-14 (×3): qty 1

## 2018-12-14 MED ORDER — ROSUVASTATIN CALCIUM 5 MG PO TABS
5.0000 mg | ORAL_TABLET | Freq: Every day | ORAL | Status: DC
  Administered 2018-12-14 – 2018-12-15 (×2): 5 mg via ORAL
  Filled 2018-12-14 (×2): qty 1

## 2018-12-14 MED ORDER — ONDANSETRON HCL 4 MG/2ML IJ SOLN
4.0000 mg | Freq: Once | INTRAMUSCULAR | Status: AC
  Administered 2018-12-14: 4 mg via INTRAVENOUS
  Filled 2018-12-14: qty 2

## 2018-12-14 MED ORDER — BRIMONIDINE TARTRATE 0.15 % OP SOLN
1.0000 [drp] | Freq: Three times a day (TID) | OPHTHALMIC | Status: DC
  Administered 2018-12-14 – 2018-12-16 (×6): 1 [drp] via OPHTHALMIC
  Filled 2018-12-14: qty 5

## 2018-12-14 MED ORDER — SODIUM CHLORIDE 0.9 % IV SOLN
INTRAVENOUS | Status: DC
  Administered 2018-12-14 – 2018-12-15 (×4): via INTRAVENOUS

## 2018-12-14 MED ORDER — LATANOPROST 0.005 % OP SOLN
1.0000 [drp] | Freq: Every day | OPHTHALMIC | Status: DC
  Administered 2018-12-14 – 2018-12-15 (×2): 1 [drp] via OPHTHALMIC
  Filled 2018-12-14: qty 2.5

## 2018-12-14 MED ORDER — SODIUM CHLORIDE 0.9 % IV BOLUS (SEPSIS)
1000.0000 mL | Freq: Once | INTRAVENOUS | Status: AC
  Administered 2018-12-14: 1000 mL via INTRAVENOUS

## 2018-12-14 MED ORDER — ENOXAPARIN SODIUM 40 MG/0.4ML ~~LOC~~ SOLN
40.0000 mg | SUBCUTANEOUS | Status: DC
  Administered 2018-12-14 – 2018-12-15 (×2): 40 mg via SUBCUTANEOUS
  Filled 2018-12-14 (×3): qty 0.4

## 2018-12-14 MED ORDER — SODIUM CHLORIDE 0.9 % IV SOLN
1.0000 g | INTRAVENOUS | Status: DC
  Administered 2018-12-15 – 2018-12-16 (×2): 1 g via INTRAVENOUS
  Filled 2018-12-14 (×2): qty 10

## 2018-12-14 MED ORDER — ACETAMINOPHEN 500 MG PO TABS
500.0000 mg | ORAL_TABLET | Freq: Once | ORAL | Status: AC
  Administered 2018-12-14: 500 mg via ORAL
  Filled 2018-12-14: qty 1

## 2018-12-14 MED ORDER — ZOLPIDEM TARTRATE 5 MG PO TABS: 5.0000 mg | ORAL_TABLET | Freq: Every evening | ORAL | Status: DC | PRN

## 2018-12-14 MED ORDER — SODIUM CHLORIDE 0.9 % IV SOLN
500.0000 mg | INTRAVENOUS | Status: DC
  Administered 2018-12-15 – 2018-12-16 (×2): 500 mg via INTRAVENOUS
  Filled 2018-12-14 (×2): qty 500

## 2018-12-14 MED ORDER — GABAPENTIN 100 MG PO CAPS
200.0000 mg | ORAL_CAPSULE | Freq: Every day | ORAL | Status: DC
  Filled 2018-12-14: qty 2

## 2018-12-14 NOTE — ED Triage Notes (Addendum)
BIB [facility] from [facility] with c/o of N/V starting at 1900. Pt seen at [facility] for dehydration last night (12/29) after returning from visiting family in [place]. Temp of 100.2 on arrival.

## 2018-12-14 NOTE — ED Notes (Signed)
ED Provider at bedside. 

## 2018-12-14 NOTE — ED Notes (Signed)
Patient transported to X-ray 

## 2018-12-14 NOTE — ED Provider Notes (Signed)
[facility] EMERGENCY DEPARTMENT Provider Note   CSN: [id number] Arrival date & time: 12/14/18  0248     History   Chief Complaint Chief Complaint  Patient presents with  . Emesis    HPI Linda [name] is a 84 y.o. female.  The history is provided by the patient.  Cough  This is a new problem. The current episode started 12 to 24 hours ago. The problem occurs every few minutes. The problem has been gradually worsening. The maximum temperature recorded prior to her arrival was 100 to 100.9 F. Associated symptoms include chills and sweats. Pertinent negatives include no headaches.  Patient with history of hypertension, hyperlipidemia presents with multiple complaints.  She reports she was recently in [place] visiting family for the holiday.  She was flying back to [place] and just prior to her layover in [place] she began feeling ill.  She was seen at a hospital in the [place] area.  She was admitted for dehydration and given IV fluids.  She reports there was no imaging studies performed.  She felt improved and was able to fly back to [place].  However over the past several hours she has been having nausea with dry heaving.  She also reports diarrhea, is also been having cough and myalgias.  Past Medical History:  Diagnosis Date  . Allergy   . Anxiety   . Arthritis   . Bursitis    right leg  . GERD (gastroesophageal reflux disease)   . History of blood transfusion   . Hyperlipidemia   . Hypertension   . Insomnia   . Non Hodgkin's lymphoma ([place]) 2017-2019   cured  . Osteopenia    MILD  . Urine retention   . Uterine cancer ([place])    at age 19    Patient Active Problem List   Diagnosis Date Noted  . Spinal stenosis of lumbar region without neurogenic claudication 12/03/2018  . Lumbar foraminal stenosis 12/03/2018  . 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 Ocular hypertension 10/20/2018  . Seasonal allergies 10/20/2018  . Trochanteric  bursitis, right hip 09/14/2018  . Chronic bilateral low back pain 09/14/2018  . Hoarseness of voice 04/08/2018  . Hypertension, essential, benign 01/16/2018  . Sensorineural hearing loss (SNHL) of both ears 01/16/2018  . Insomnia 01/16/2018  . MALT lymphoma ([place]) 01/15/2018  . 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 Osteopenia of both hips 01/15/2018  . Hyperlipidemia 01/15/2018  . DJD (degenerative joint disease) of knee 02/13/2011  . H/O difficult intubation 02/12/2011  . Dry eye syndrome 04/10/2010  . Incomplete emptying of bladder 08/31/2009  . Borderline glaucoma with ocular hypertension 09/07/2008    Past Surgical History:  Procedure Laterality Date  . ABDOMINAL HYSTERECTOMY     AGE 6  . APPENDECTOMY     AGE 18  . BLADDER SURGERY    . Catarct Surgery    . ECTOPIC PREGNANCY SURGERY     age 44  . LAPAROSCOPIC SMALL BOWEL RESECTION    . NM PET DX LYMPHOMA  10/2018   IN REMISSION  . occular pressure    . OTHER SURGICAL HISTORY    . REPLACEMENT TOTAL KNEE BILATERAL    . TONSILLECTOMY AND ADENOIDECTOMY     age 33 or 54  . UTERINE CANCER SURGERY     age 68- located in vagina     OB History   No obstetric history on file.      Home Medications  Prior to Admission medications   Medication Sig Start Date End Date Taking? Authorizing Provider  acetaminophen (TYLENOL) 500 MG tablet Take 500 mg by mouth every 6 (six) hours as needed.    [provider]  bimatoprost (LUMIGAN) 0.01 % SOLN Place 1 drop into both eyes at bedtime.    [provider]  brimonidine (ALPHAGAN) 0.15 % ophthalmic solution Place 1 drop into both eyes 3 (three) times daily.    [provider]  cetirizine (ZYRTEC) 10 MG tablet Take 10 mg by mouth daily.    [provider]  diazepam (VALIUM) 5 MG tablet Take 1 by mouth 1 hour  pre-procedure with very light food. May bring 2nd tablet to appointment. 11/09/18   Magnus Sinning, MD  eszopiclone (LUNESTA) 2 MG  TABS tablet Take 1 tablet (2 mg total) by mouth at bedtime. Take immediately before bedtime 10/20/18   Reed, Tiffany L, DO  gabapentin (NEURONTIN) 100 MG capsule Take two capsules by mouth once daily at bedtime 11/08/18   Reed, Tiffany L, DO  loperamide (IMODIUM) 2 MG capsule Take by mouth.    [provider]  losartan (COZAAR) 50 MG tablet Take 1 tablet (50 mg total) by mouth daily. 10/20/18   Reed, Tiffany L, DO  nitrofurantoin (MACRODANTIN) 50 MG capsule  12/10/17   [provider]  rosuvastatin (CRESTOR) 5 MG tablet Take 1 tablet (5 mg total) by mouth at bedtime. 10/20/18   Gayland Curry, DO    Family History Family History  Problem Relation Age of Onset  . Uterine cancer Sister   . COPD Maternal Grandfather   . Heart failure Mother 4  . Pneumonia Father 73  . Dementia Father   . Uterine cancer Maternal Grandmother   . Other Sister        Guillain Barre syndrom  . Kidney cancer Son   . Dementia Paternal Grandmother   . Colon cancer Neg Hx   . Esophageal cancer Neg Hx   . Liver cancer Neg Hx   . Pancreatic cancer Neg Hx   . Rectal cancer Neg Hx   . Stomach cancer Neg Hx     Social History Social History   Tobacco Use  . Smoking status: Never Smoker  . Smokeless tobacco: Never Used  Substance Use Topics  . Alcohol use: Yes    Comment: one glass of wine with dinner  . Drug use: No     Allergies   Patient has no known allergies.   Review of Systems Review of Systems  Constitutional: Positive for chills and fatigue.  Respiratory: Positive for cough.   Gastrointestinal: Positive for diarrhea and nausea.  Neurological: Negative for headaches.  All other systems reviewed and are negative.    Physical Exam Updated Vital Signs Pulse 77   Temp 100.2 F (37.9 C)   Resp 16   Ht 1.626 m (5\' 4" )   Wt 88.5 kg   SpO2 97%   BMI 33.47 kg/m   Physical Exam CONSTITUTIONAL: Elderly, ill-appearing HEAD: Normocephalic/atraumatic EYES:  EOMI/PERRL ENMT: Mucous membranes dry NECK: supple no meningeal signs SPINE/BACK:entire spine nontender CV: S1/S2 noted, no murmurs/rubs/gallops noted LUNGS: Coarse breath sounds noted in the bases, no acute distress ABDOMEN: soft, mild suprapubic tenderness, no rebound or guarding, bowel sounds noted throughout abdomen GU:no cva tenderness NEURO: Pt is awake/alert/appropriate, moves all extremitiesx4.  No facial droop.   EXTREMITIES: pulses normal/equal, full ROM SKIN: warm, color normal PSYCH: no abnormalities of mood noted, alert and oriented  to situation   ED Treatments / Results  Labs (all labs ordered are listed, but only abnormal results are displayed) Labs Reviewed  CBC WITH DIFFERENTIAL/PLATELET - Abnormal; Notable for the following components:      Result Value   RBC 3.85 (*)    Hemoglobin 11.3 (*)    MCHC 29.7 (*)    Lymphs Abs 0.6 (*)    All other components within normal limits  COMPREHENSIVE METABOLIC PANEL - Abnormal; Notable for the following components:   Glucose, Bld 102 (*)    Calcium 8.8 (*)    Total Protein 6.0 (*)    Albumin 3.4 (*)    All other components within normal limits  LIPASE, BLOOD  INFLUENZA PANEL BY PCR (TYPE A & B)  URINALYSIS, ROUTINE W REFLEX MICROSCOPIC    EKG EKG Interpretation  Date/Time:  Tuesday December 14 2018 03:42:34 EST Ventricular Rate:  83 PR Interval:    QRS Duration: 101 QT Interval:  380 QTC Calculation: 447 R Axis:   -30 Text Interpretation:  Sinus rhythm Left axis deviation Nonspecific T abnormalities, lateral leads Interpretation limited secondary to artifact No previous ECGs available Confirmed by Ripley Fraise [phone]) on 12/14/2018 4:00:25 AM   Radiology Dg Chest 2 View  Result Date: 12/14/2018 CLINICAL DATA:  Acute onset of cough and shortness of breath. EXAM: CHEST - 2 VIEW COMPARISON:  None. FINDINGS: The lungs are well-aerated. Left perihilar airspace opacification raises concern for pneumonia. There  is no evidence of pleural effusion or pneumothorax. The heart is normal in size; the mediastinal contour is within normal limits. No acute osseous abnormalities are seen. IMPRESSION: Left perihilar airspace opacification raises concern for pneumonia. Electronically Signed   By: Garald Balding M.D.   On: 12/14/2018 03:33    Procedures Procedures   Medications Ordered in ED Medications  sodium chloride 0.9 % bolus 1,000 mL (1,000 mLs Intravenous New Bag/Given 12/14/18 0342)  acetaminophen (TYLENOL) tablet 650 mg (650 mg Oral Given 12/14/18 0337)  ondansetron (ZOFRAN) injection 4 mg (4 mg Intravenous Given 12/14/18 0337)  cefTRIAXone (ROCEPHIN) 1 g in sodium chloride 0.9 % 100 mL IVPB (1 g Intravenous New Bag/Given 12/14/18 0407)  azithromycin (ZITHROMAX) tablet 500 mg (500 mg Oral Given 12/14/18 0407)     Initial Impression / Assessment and Plan / ED Course  I have reviewed the triage vital signs and the nursing notes.  Pertinent labs & imaging results that were available during my care of the patient were reviewed by me and considered in my medical decision making (see chart for details).     6:12 AM Patient presented with viral illness type symptoms including nausea, diarrhea and cough.  She has already been at another hospital for similar symptoms and was discharged and came home.  Here she had a temperature of 100.2, was coughing frequently and had hypoxia on room air down to 88 to 89%(per nursing staff) This Is a new oxygen requirement.  X-ray reveals pneumonia. She feels well after IV fluids and antinausea meds.  However due to presence of pneumonia with room air hypoxia, patient will be admitted.  She is currently wearing 2 L of oxygen with improvement.  Discussed with  Dr. Hal Hope for admission  Final Clinical Impressions(s) / ED Diagnoses   Final diagnoses:  Community acquired pneumonia of left lower lobe of lung [facility])  Hypoxia    ED Discharge Orders    None        Ripley Fraise, MD 12/14/18 [phone]

## 2018-12-14 NOTE — ED Notes (Signed)
Attempted to give report, nurse not available at this time and will call me when she is available

## 2018-12-14 NOTE — H&P (Signed)
History and Physical    Linda [name] [id number] LZJ:[id number] DOB: [birth year] DOA: 12/14/2018  PCP: Gayland Curry, DO Consultants:  Bennetta Rudden - orthopedics; Ernestina Patches - PM&R; [facility] - ENT; Ardis Hughs - GIGloriann Loan - urology; Loletha Grayer - eye Patient coming from:  Home - lives alone at [place]; [place]: [place], [place]  Chief Complaint: Emesis  HPI: Linda [name] is a 84 y.o. female with medical history significant of remote uterine cancer; NHL; HTN; and HLD presenting with emesis.  She moved to [place] about a year ago.  About 2-3 weeks ago, she had a spinal epidural injection - her sciatica magically disappeared.  She was able to go to [facility] - she flew out Christmas Day and had a great time.  She started back on 12/29 and connected through [place].  When she got off the plane, she felt fecal urgency and had a loose stool.  When she came out of the bathroom, she got nauseated and dizzy and pre-syncopal.  She got to the gate but was not able to get on the plane.  The medics took her to the ER in [place], [place].  She stayed in the ER for many hours and they told her she was dehydrated; since she was alone and travelling they observed her overnight.  They discharged her about 10am and she was feeling better.  She went to the airport and flew straight home.  She was coughing and felt miserable.  She got in bed and slept for 3 hours.  She woke about 0200 with chills, nausea, feeling miserable.  She contacted the staff at [facility] and the nurse came up and checked her.  She was complaining of left flank/back pain and the nurse decided to send her in.  Last night she had a fever to 100.2.  +cough, rhinorrhea.  Cough is nonproductive.  +hoarseness.  She has intermittent diarrhea after her bowel resection, and this always makes her weak.  She has not had any loose stools in the last 24 hours.  Bladder surgery in the past (15+ years ago), self-caths - but does not have h/o urinary infections.   ED Course: [place], per Dr.  Hal Hope:  84 year old female with history of hypertension just traveling back from [place] has been having fever chills productive cough. On route patient was admitted at [place] for 1 day but patient got discharged and came back to [place]. Since patient has been not feeling well with productive cough nausea vomiting came back to the ER. In the ER patient is having a oxygen requirement and generally not feeling well chest x-ray showing infiltrates concerning for pneumonia with fever admitted for observation.  Review of Systems: As per HPI; otherwise review of systems reviewed and negative.   Ambulatory Status:  Ambulates without assistance  Past Medical History:  Diagnosis Date  . Allergy   . Anxiety   . Arthritis   . Bursitis    right leg  . GERD (gastroesophageal reflux disease)   . History of blood transfusion   . Hyperlipidemia   . Hypertension   . Insomnia   . Non Hodgkin's lymphoma ([place]) 2017-2019   cured  . Osteopenia    MILD  . Urine retention   . Uterine cancer ([place])    at age 62    Past Surgical History:  Procedure Laterality Date  . ABDOMINAL HYSTERECTOMY     AGE 28  . APPENDECTOMY     AGE 89  . BLADDER SURGERY    .  Catarct Surgery    . ECTOPIC PREGNANCY SURGERY     age 16  . LAPAROSCOPIC SMALL BOWEL RESECTION    . NM PET DX LYMPHOMA  10/2018   IN REMISSION  . occular pressure    . OTHER SURGICAL HISTORY    . REPLACEMENT TOTAL KNEE BILATERAL    . TONSILLECTOMY AND ADENOIDECTOMY     age 76 or 67  . UTERINE CANCER SURGERY     age 80- located in vagina    Social History   Socioeconomic History  . Marital status: Single    Spouse name: Not on file  . Number of children: Not on file  . Years of education: Not on file  . Highest education level: Not on file  Occupational History  . Occupation: retired  Scientific laboratory technician  . Financial resource strain: Not on file  . Food insecurity:    Worry: Not on file    Inability: Not on file  .  Transportation needs:    Medical: Not on file    Non-medical: Not on file  Tobacco Use  . Smoking status: Never Smoker  . Smokeless tobacco: Never Used  Substance and Sexual Activity  . Alcohol use: Yes    Alcohol/week: 7.0 standard drinks    Types: 7 Glasses of wine per week    Comment: one glass of wine with dinner  . Drug use: No  . Sexual activity: Never  Lifestyle  . Physical activity:    Days per week: Not on file    Minutes per session: Not on file  . Stress: Not on file  Relationships  . Social connections:    Talks on phone: Not on file    Gets together: Not on file    Attends religious service: Not on file    Active member of club or organization: Not on file    Attends meetings of clubs or organizations: Not on file    Relationship status: Not on file  . Intimate partner violence:    Fear of current or ex partner: Not on file    Emotionally abused: Not on file    Physically abused: Not on file    Forced sexual activity: Not on file  Other Topics Concern  . Not on file  Social History Narrative   Social History      Diet? Low to no fiber diet; lactose (no tolerance), low or no soy      Do you drink/eat things with caffeine? No liquid caffeine, occasional chocolate      Marital status?                divorced                    What year were you married? n/a      Do you live in a house, apartment, assisted living, condo, trailer, etc.? Apartment/ retirement      Is it one or more stories? one      How many persons live in your home? one      Do you have any pets in your home? (please list) no      Highest level of education completed? Master degree      Current or past profession: Audiologist/ Speech Therapist      Do you exercise?                 yes  Type & how often? Water aerobics 3 x week      Advanced Directives      Do you have a living will? yes      Do you have a DNR form?        yes                         If not, do  you want to discuss one?      Do you have signed POA/HPOA for forms? yes      Functional Status : completed by self      Do you have difficulty bathing or dressing yourself? no      Do you have difficulty preparing food or eating? no      Do you have difficulty managing your medications? No      Do you have difficulty managing your finances? no      Do you have difficulty affording your medications? no    No Known Allergies  Family History  Problem Relation Age of Onset  . Uterine cancer Sister   . COPD Maternal Grandfather   . Heart failure Mother 44  . Pneumonia Father 30  . Dementia Father   . Uterine cancer Maternal Grandmother   . Other Sister        Guillain Barre syndrom  . Kidney cancer Son   . Dementia Paternal Grandmother   . Colon cancer Neg Hx   . Esophageal cancer Neg Hx   . Liver cancer Neg Hx   . Pancreatic cancer Neg Hx   . Rectal cancer Neg Hx   . Stomach cancer Neg Hx     Prior to Admission medications   Medication Sig Start Date End Date Taking? Authorizing Provider  acetaminophen (TYLENOL) 500 MG tablet Take 500 mg by mouth every 6 (six) hours as needed.    [provider]  bimatoprost (LUMIGAN) 0.01 % SOLN Place 1 drop into both eyes at bedtime.    [provider]  brimonidine (ALPHAGAN) 0.15 % ophthalmic solution Place 1 drop into both eyes 3 (three) times daily.    [provider]  cetirizine (ZYRTEC) 10 MG tablet Take 10 mg by mouth daily.    [provider]  diazepam (VALIUM) 5 MG tablet Take 1 by mouth 1 hour  pre-procedure with very light food. May bring 2nd tablet to appointment. 11/09/18   Magnus Sinning, MD  eszopiclone (LUNESTA) 2 MG TABS tablet Take 1 tablet (2 mg total) by mouth at bedtime. Take immediately before bedtime 10/20/18   Reed, Tiffany L, DO  gabapentin (NEURONTIN) 100 MG capsule Take two capsules by mouth once daily at bedtime 11/08/18   Reed, Tiffany L, DO  loperamide (IMODIUM) 2 MG  capsule Take by mouth.    [provider]  losartan (COZAAR) 50 MG tablet Take 1 tablet (50 mg total) by mouth daily. 10/20/18   Reed, Tiffany L, DO  nitrofurantoin (MACRODANTIN) 50 MG capsule  12/10/17   [provider]  rosuvastatin (CRESTOR) 5 MG tablet Take 1 tablet (5 mg total) by mouth at bedtime. 10/20/18   Gayland Curry, DO    Physical Exam: Vitals:   12/14/18 1045 12/14/18 1115 12/14/18 1230 12/14/18 1428  BP: (!) 145/45 (!) 142/48 (!) 132/47 (!) 141/38  Pulse: 76 77 71 78  Resp: 20 20 (!) 25   Temp:    (!) 100.5 F (38.1 C)  TempSrc:    Oral  SpO2: 95% 94% (!) 88% 95%  Weight:      Height:         General:  Appears calm and comfortable and is NAD Eyes:  PERRL, EOMI, normal lids, iris ENT:  grossly normal hearing, lips & tongue, mmm Neck:  no LAD, masses or thyromegaly Cardiovascular:  RRR, no m/r/g. No LE edema.  Respiratory:   L perihilar rhonchi.  Normal to mildly increased respiratory effort. Abdomen:  soft, NT, ND, NABS Back:   normal alignment, no CVAT Skin:  no rash or induration seen on limited exam Musculoskeletal:  grossly normal tone BUE/BLE, good ROM, no bony abnormality Psychiatric:  grossly normal mood and affect, speech fluent and appropriate, AOx3 Neurologic:  CN 2-12 grossly intact, moves all extremities in coordinated fashion, sensation intact    Radiological Exams on Admission: Dg Chest 2 View  Result Date: 12/14/2018 CLINICAL DATA:  Acute onset of cough and shortness of breath. EXAM: CHEST - 2 VIEW COMPARISON:  None. FINDINGS: The lungs are well-aerated. Left perihilar airspace opacification raises concern for pneumonia. There is no evidence of pleural effusion or pneumothorax. The heart is normal in size; the mediastinal contour is within normal limits. No acute osseous abnormalities are seen. IMPRESSION: Left perihilar airspace opacification raises concern for pneumonia. Electronically Signed   By: Garald Balding M.D.   On:  12/14/2018 03:33    EKG: Independently reviewed.  NSR with rate 83; nonspecific ST changes with no evidence of acute ischemia   Labs on Admission: I have personally reviewed the available labs and imaging studies at the time of the admission.  Pertinent labs:   Unremarkable CMP WBC 5.6 Hgb 11.3 Flu negative UA: small hgb, 5 ketones, trace LE  Assessment/Plan Principal Problem:   CAP (community acquired pneumonia) Active Problems:   Hypertension, essential, benign   Incomplete emptying of bladder   CAP -Given URI symptoms, mildly decreased oxygen saturation, and infiltrate in left lingular region on chest x-ray, most likely community-acquired pneumonia.  -Influenza negative. -CURB-65 score is 1 - will observe the patient to Med Surg. -Pneumonia Severity Index (PSI) is Class 2, 0.9% mortality. -Will start Azithromycin 500 mg daily AND Rocephin due to no risk factors for MDR cause. -Additional complicating factors include: hypoxia/hypoxemia; associated dehydration (as evidenced by ketonuria) -NS @ 75cc/hr -Fever control -Repeat CBC in am -Sputum cultures -Blood cultures -Strep pneumo testing -Respiratory virus panel ordered to include pertussis -Will order lower respiratory tract procalcitonin level.  Antibiotics would not be indicated for PCT <0.1 and probably should not be used for < 0.25.  >0.5 indicates infection and >>0.5 indicates more serious disease.  As the procalcitonin level normalizes, it will be reasonable to consider de-escalation of antibiotic coverage.  HTN -Continue Cozaar  Incomplete bladder emptying -Self-caths routinely -Will add I/O caths -Urine does not appear to be infected -Hold Nitrofurantoin, which she uses for UTI prophylaxis  DVT prophylaxis:  Lovenox  Code Status:  Full - confirmed with patient Family Communication: None present Disposition Plan:  Home once clinically improved Consults called: None  Admission status: It is my clinical  opinion that referral for OBSERVATION is reasonable and necessary in this patient based on the above information provided. The aforementioned taken together are felt to place the patient at high risk for further clinical deterioration. However it is anticipated that the patient may be medically stable for discharge from the hospital within 24 to 48 hours.    Karmen Bongo MD [facility]  If note is  complete, please contact covering daytime or nighttime physician. [email or web address] Password [email or web address]  12/14/2018, 3:11 PM

## 2018-12-14 NOTE — ED Notes (Signed)
Pt stating she typically self caths however when she sleeps she is incontinent sometimes. Discussed with patient and family need for pure wick.

## 2018-12-15 DIAGNOSIS — J181 Lobar pneumonia, unspecified organism: Secondary | ICD-10-CM | POA: Diagnosis not present

## 2018-12-15 DIAGNOSIS — R509 Fever, unspecified: Secondary | ICD-10-CM | POA: Diagnosis present

## 2018-12-15 DIAGNOSIS — J189 Pneumonia, unspecified organism: Secondary | ICD-10-CM | POA: Diagnosis not present

## 2018-12-15 DIAGNOSIS — Z79899 Other long term (current) drug therapy: Secondary | ICD-10-CM | POA: Diagnosis not present

## 2018-12-15 DIAGNOSIS — R05 Cough: Secondary | ICD-10-CM | POA: Diagnosis present

## 2018-12-15 DIAGNOSIS — Z8542 Personal history of malignant neoplasm of other parts of uterus: Secondary | ICD-10-CM | POA: Diagnosis not present

## 2018-12-15 DIAGNOSIS — E785 Hyperlipidemia, unspecified: Secondary | ICD-10-CM | POA: Diagnosis not present

## 2018-12-15 DIAGNOSIS — R0902 Hypoxemia: Secondary | ICD-10-CM | POA: Diagnosis not present

## 2018-12-15 DIAGNOSIS — R339 Retention of urine, unspecified: Secondary | ICD-10-CM | POA: Diagnosis not present

## 2018-12-15 DIAGNOSIS — I10 Essential (primary) hypertension: Secondary | ICD-10-CM | POA: Diagnosis not present

## 2018-12-15 DIAGNOSIS — Z8572 Personal history of non-Hodgkin lymphomas: Secondary | ICD-10-CM | POA: Diagnosis not present

## 2018-12-15 LAB — CBC WITH DIFFERENTIAL/PLATELET
Abs Immature Granulocytes: 0.03 10*3/uL (ref 0.00–0.07)
Basophils Absolute: 0 10*3/uL (ref 0.0–0.1)
Basophils Relative: 0 %
Eosinophils Absolute: 0.1 10*3/uL (ref 0.0–0.5)
Eosinophils Relative: 2 %
HCT: 32.6 % — ABNORMAL LOW (ref 36.0–46.0)
HEMOGLOBIN: 10.1 g/dL — AB (ref 12.0–15.0)
Immature Granulocytes: 1 %
LYMPHS ABS: 1 10*3/uL (ref 0.7–4.0)
LYMPHS PCT: 16 %
MCH: 30 pg (ref 26.0–34.0)
MCHC: 31 g/dL (ref 30.0–36.0)
MCV: 96.7 fL (ref 80.0–100.0)
Monocytes Absolute: 0.7 10*3/uL (ref 0.1–1.0)
Monocytes Relative: 12 %
Neutro Abs: 4.4 10*3/uL (ref 1.7–7.7)
Neutrophils Relative %: 69 %
Platelets: 148 10*3/uL — ABNORMAL LOW (ref 150–400)
RBC: 3.37 MIL/uL — ABNORMAL LOW (ref 3.87–5.11)
RDW: 12.4 % (ref 11.5–15.5)
WBC: 6.3 10*3/uL (ref 4.0–10.5)
nRBC: 0 % (ref 0.0–0.2)

## 2018-12-15 LAB — EXPECTORATED SPUTUM ASSESSMENT W GRAM STAIN, RFLX TO RESP C

## 2018-12-15 LAB — BASIC METABOLIC PANEL
Anion gap: 8 (ref 5–15)
BUN: 8 mg/dL (ref 8–23)
CHLORIDE: 106 mmol/L (ref 98–111)
CO2: 25 mmol/L (ref 22–32)
CREATININE: 0.64 mg/dL (ref 0.44–1.00)
Calcium: 8.2 mg/dL — ABNORMAL LOW (ref 8.9–10.3)
GFR calc Af Amer: >60 mL/min (ref >60)
GFR calc non Af Amer: >60 mL/min (ref >60)
Glucose, Bld: 93 mg/dL (ref 70–99)
Potassium: 3.5 mmol/L (ref 3.5–5.1)
Sodium: 139 mmol/L (ref 135–145)

## 2018-12-15 LAB — EXPECTORATED SPUTUM ASSESSMENT W REFEX TO RESP CULTURE

## 2018-12-15 MED ORDER — GUAIFENESIN ER 600 MG PO TB12
1200.0000 mg | ORAL_TABLET | Freq: Two times a day (BID) | ORAL | Status: DC
  Administered 2018-12-15 – 2018-12-16 (×3): 1200 mg via ORAL
  Filled 2018-12-15 (×3): qty 2

## 2018-12-15 MED ORDER — GABAPENTIN 100 MG PO CAPS: 200.0000 mg | ORAL_CAPSULE | Freq: Two times a day (BID) | ORAL | Status: DC | PRN

## 2018-12-15 NOTE — Progress Notes (Signed)
PROGRESS NOTE                                                                                                                                                                                                             Patient Demographics:    Linda [name], is a 85 y.o. female, DOB - November 15, 1932, ZMO:[id number]  Admit date - 12/14/2018   Admitting Physician Karmen Bongo, MD  Outpatient Primary MD for the patient is Gayland Curry, DO  LOS - 0   Chief Complaint  Patient presents with  . Emesis       Brief Narrative   85 y.o. female with medical history significant of remote uterine cancer; NHL; HTN; and HLD presenting with emesis, shortness of breath, cough, congestion, she was noted to have fever, work-up significant for community-acquired pneumonia, admitted for further treatment   Subjective:    [name] today has, No headache, No chest pain, No abdominal pain - No Nausea, No new weakness tingling or numbness, worsening cough, congestion, reports unable to produce her cough   Assessment  & Plan :    Principal Problem:   CAP (community acquired pneumonia) Active Problems:   Hypertension, essential, benign   Incomplete emptying of bladder  CAP -100.5, chest x-ray significant for follicular pneumonia, influenza negative. -Moving, nontoxic-appearing, continue with IV Rocephin and azithromycin, likely should be able to be discharged tomorrow on oral regimen -Reports cough, with congestion, encouraged her to use flutter valve more frequently, started on Mucinex -Follow respiratory virus panel  HTN -Continue Cozaar  Incomplete bladder emptying -Self-caths routinely -Will add I/O caths -Urine does not appear to be infected -Hold Nitrofurantoin, which she uses for UTI prophylaxis she is on IV Rocephin for pneumonia  Code Status : Full  Family Communication  : None at bedside  Disposition Plan  : back to [facility]  tomorrow  Consults  :  None  Procedures  : none  DVT Prophylaxis  :  [place] lovenox  Lab Results  Component Value Date   PLT 148 (L) 12/15/2018    Antibiotics  :    Anti-infectives (From admission, onward)   Start     Dose/Rate Route Frequency Ordered Stop   12/15/18 0430  azithromycin (ZITHROMAX) 500 mg in sodium chloride 0.9 % 250 mL IVPB     500 mg 250 mL/hr  over 60 Minutes Intravenous Every 24 hours 12/14/18 0856 12/22/18 0429   12/15/18 0400  cefTRIAXone (ROCEPHIN) 1 g in sodium chloride 0.9 % 100 mL IVPB     1 g 200 mL/hr over 30 Minutes Intravenous Every 24 hours 12/14/18 0856 12/22/18 0359   12/14/18 0345  cefTRIAXone (ROCEPHIN) 1 g in sodium chloride 0.9 % 100 mL IVPB     1 g 200 mL/hr over 30 Minutes Intravenous  Once 12/14/18 0341 12/14/18 0437   12/14/18 0345  azithromycin (ZITHROMAX) tablet 500 mg     500 mg Oral  Once 12/14/18 0341 12/14/18 0407        Objective:   Vitals:   12/14/18 1230 12/14/18 1428 12/14/18 2159 12/15/18 0544  BP: (!) 132/47 (!) 141/38 (!) 131/43 (!) 142/87  Pulse: 71 78 73 79  Resp: (!) 25  18 18   Temp:  (!) 100.5 F (38.1 C) 98.5 F (36.9 C) 98.2 F (36.8 C)  TempSrc:  Oral    SpO2: (!) 88% 95% 95% 97%  Weight:      Height:        Wt Readings from Last 3 Encounters:  12/14/18 88.5 kg  12/03/18 88.5 kg  11/03/18 88.5 kg     Intake/Output Summary (Last 24 hours) at 12/15/2018 1312 Last data filed at 12/15/2018 0554 Gross per 24 hour  Intake 1733.48 ml  Output -  Net 1733.48 ml     Physical Exam  Awake Alert, Oriented X 3, No new F.N deficits, Normal affect Symmetrical Chest wall movement, Good air movement bilaterally, CTAB RRR,No Gallops,Rubs or new Murmurs, No Parasternal Heave +ve B.Sounds, Abd Soft, No tenderness, No rebound - guarding or rigidity. No Cyanosis, Clubbing or edema, No new Rash or bruise      Data Review:    CBC Recent Labs  Lab 12/14/18 0316 12/15/18 0357  WBC 5.6 6.3  HGB 11.3* 10.1*  HCT  38.0 32.6*  PLT 168 148*  MCV 98.7 96.7  MCH 29.4 30.0  MCHC 29.7* 31.0  RDW 12.4 12.4  LYMPHSABS 0.6* 1.0  MONOABS 0.4 0.7  EOSABS 0.1 0.1  BASOSABS 0.0 0.0    Chemistries  Recent Labs  Lab 12/14/18 0316 12/15/18 0357  NA 142 139  K 4.4 3.5  CL 108 106  CO2 26 25  GLUCOSE 102* 93  BUN 12 8  CREATININE 0.78 0.64  CALCIUM 8.8* 8.2*  AST 29  --   ALT 17  --   ALKPHOS 83  --   BILITOT 1.1  --    ------------------------------------------------------------------------------------------------------------------ No results for input(s): CHOL, HDL, LDLCALC, TRIG, CHOLHDL, LDLDIRECT in the last 72 hours.  No results found for: HGBA1C ------------------------------------------------------------------------------------------------------------------ No results for input(s): TSH, T4TOTAL, T3FREE, THYROIDAB in the last 72 hours.  Invalid input(s): FREET3 ------------------------------------------------------------------------------------------------------------------ No results for input(s): VITAMINB12, FOLATE, FERRITIN, TIBC, IRON, RETICCTPCT in the last 72 hours.  Coagulation profile No results for input(s): INR, PROTIME in the last 168 hours.  No results for input(s): DDIMER in the last 72 hours.  Cardiac Enzymes No results for input(s): CKMB, TROPONINI, MYOGLOBIN in the last 168 hours.  Invalid input(s): CK ------------------------------------------------------------------------------------------------------------------ No results found for: BNP  Inpatient Medications  Scheduled Meds: . brimonidine  1 drop Both Eyes TID  . enoxaparin (LOVENOX) injection  40 mg Subcutaneous Q24H  . guaiFENesin  1,200 mg Oral BID  . latanoprost  1 drop Both Eyes QHS  . loratadine  10 mg Oral Daily  . losartan  50  mg Oral Daily  . rosuvastatin  5 mg Oral QHS   Continuous Infusions: . sodium chloride 75 mL/hr at 12/15/18 0333  . azithromycin 500 mg (12/15/18 0431)  . cefTRIAXone  (ROCEPHIN)  IV 1 g (12/15/18 0343)   PRN Meds:.gabapentin, zolpidem  Micro Results Recent Results (from the past 240 hour(s))  Respiratory Panel by PCR     Status: None   Collection Time: 12/14/18  9:21 AM  Result Value Ref Range Status   Adenovirus NOT DETECTED NOT DETECTED Final   Coronavirus 229E NOT DETECTED NOT DETECTED Final   Coronavirus HKU1 NOT DETECTED NOT DETECTED Final   Coronavirus NL63 NOT DETECTED NOT DETECTED Final   Coronavirus OC43 NOT DETECTED NOT DETECTED Final   Metapneumovirus NOT DETECTED NOT DETECTED Final   Rhinovirus / Enterovirus NOT DETECTED NOT DETECTED Final   Influenza A NOT DETECTED NOT DETECTED Final   Influenza B NOT DETECTED NOT DETECTED Final   Parainfluenza Virus 1 NOT DETECTED NOT DETECTED Final   Parainfluenza Virus 2 NOT DETECTED NOT DETECTED Final   Parainfluenza Virus 3 NOT DETECTED NOT DETECTED Final   Parainfluenza Virus 4 NOT DETECTED NOT DETECTED Final   Respiratory Syncytial Virus NOT DETECTED NOT DETECTED Final   Bordetella pertussis NOT DETECTED NOT DETECTED Final   Chlamydophila pneumoniae NOT DETECTED NOT DETECTED Final   Mycoplasma pneumoniae NOT DETECTED NOT DETECTED Final    Comment: Performed at [facility], [place]. [address] [place], [place] [address]  Culture, sputum-assessment     Status: None   Collection Time: 12/15/18  4:34 AM  Result Value Ref Range Status   Specimen Description EXPECTORATED SPUTUM  Final   Special Requests NONE  Final   Sputum evaluation   Final    THIS SPECIMEN IS ACCEPTABLE FOR SPUTUM CULTURE Performed at [facility], [place] [address] [place], [place] [address]    Report Status 12/15/2018 FINAL  Final  Culture, respiratory     Status: None (Preliminary result)   Collection Time: 12/15/18  4:34 AM  Result Value Ref Range Status   Specimen Description EXPECTORATED SPUTUM  Final   Special Requests NONE Reflexed from [id number]  Final   Gram Stain   Final    ABUNDANT WBC PRESENT,  PREDOMINANTLY PMN ABUNDANT GRAM NEGATIVE RODS FEW GRAM POSITIVE COCCI Performed at [facility], [place] [address], [place], [place] [address]    Culture PENDING  Incomplete   Report Status PENDING  Incomplete    Radiology Reports Dg Chest 2 View  Result Date: 12/14/2018 CLINICAL DATA:  Acute onset of cough and shortness of breath. EXAM: CHEST - 2 VIEW COMPARISON:  None. FINDINGS: The lungs are well-aerated. Left perihilar airspace opacification raises concern for pneumonia. There is no evidence of pleural effusion or pneumothorax. The heart is normal in size; the mediastinal contour is within normal limits. No acute osseous abnormalities are seen. IMPRESSION: Left perihilar airspace opacification raises concern for pneumonia. Electronically Signed   By: Garald Balding M.D.   On: 12/14/2018 03:33   Xr C-arm No Report  Result Date: 11/17/2018 Please see Notes tab for imaging impression.     Phillips Climes M.D on 12/15/2018 at 1:12 PM  Between 7am to 7pm - Pager - [phone]  After 7pm go to [email or web address] - password [email or web address]  [facility] -  Office  [phone]

## 2018-12-15 NOTE — Plan of Care (Signed)
Patient coughing and deep breathing, Out of bed with no difficulty ambulating

## 2018-12-16 DIAGNOSIS — Z8572 Personal history of non-Hodgkin lymphomas: Secondary | ICD-10-CM | POA: Diagnosis not present

## 2018-12-16 DIAGNOSIS — R05 Cough: Secondary | ICD-10-CM | POA: Diagnosis present

## 2018-12-16 DIAGNOSIS — Z79899 Other long term (current) drug therapy: Secondary | ICD-10-CM | POA: Diagnosis not present

## 2018-12-16 DIAGNOSIS — R339 Retention of urine, unspecified: Secondary | ICD-10-CM | POA: Diagnosis not present

## 2018-12-16 DIAGNOSIS — R509 Fever, unspecified: Secondary | ICD-10-CM | POA: Diagnosis present

## 2018-12-16 DIAGNOSIS — E785 Hyperlipidemia, unspecified: Secondary | ICD-10-CM | POA: Diagnosis not present

## 2018-12-16 DIAGNOSIS — I10 Essential (primary) hypertension: Secondary | ICD-10-CM

## 2018-12-16 DIAGNOSIS — J181 Lobar pneumonia, unspecified organism: Secondary | ICD-10-CM | POA: Diagnosis not present

## 2018-12-16 DIAGNOSIS — Z8542 Personal history of malignant neoplasm of other parts of uterus: Secondary | ICD-10-CM | POA: Diagnosis not present

## 2018-12-16 DIAGNOSIS — J189 Pneumonia, unspecified organism: Secondary | ICD-10-CM | POA: Diagnosis not present

## 2018-12-16 DIAGNOSIS — R0902 Hypoxemia: Secondary | ICD-10-CM | POA: Diagnosis not present

## 2018-12-16 MED ORDER — AZITHROMYCIN 500 MG PO TABS: 500.0000 mg | ORAL_TABLET | Freq: Every day | ORAL | 0 refills | Status: DC

## 2018-12-16 MED ORDER — GUAIFENESIN ER 600 MG PO TB12: 1200.0000 mg | ORAL_TABLET | Freq: Two times a day (BID) | ORAL | 0 refills | Status: DC

## 2018-12-16 MED ORDER — CEFPODOXIME PROXETIL 200 MG PO TABS: 200.0000 mg | ORAL_TABLET | Freq: Two times a day (BID) | ORAL | 0 refills | Status: DC

## 2018-12-16 MED ORDER — NITROFURANTOIN MACROCRYSTAL 50 MG PO CAPS: 50.0000 mg | ORAL_CAPSULE | Freq: Every day | ORAL | Status: AC

## 2018-12-16 MED FILL — CEFPODOXIME 200 MG TABLET: 200 | 3 days supply | Qty: 6 | Fill #0

## 2018-12-16 MED FILL — AZITHROMYCIN 500 MG TABLET: 500 | 3 days supply | Qty: 3 | Fill #0

## 2018-12-16 MED FILL — MUCUS RELIEF 600 MG TB12: 600 | 10 days supply | Qty: 20 | Fill #0

## 2018-12-16 NOTE — Discharge Instructions (Signed)
Follow with Primary MD Mariea Clonts, Tiffany L, DO in 7 days   Get CBC, CMP, 2 view Chest X ray checked  by Primary MD next visit.    Activity: As tolerated with Full fall precautions use walker/cane & assistance as needed   Disposition [facility]   Diet: Heart Healthy  , with feeding assistance and aspiration precautions.   On your next visit with your primary care physician please Get Medicines reviewed and adjusted.   Please request your Prim.MD to go over all Hospital Tests and Procedure/Radiological results at the follow up, please get all Hospital records sent to your Prim MD by signing hospital release before you go home.   If you experience worsening of your admission symptoms, develop shortness of breath, life threatening emergency, suicidal or homicidal thoughts you must seek medical attention immediately by calling 911 or calling your MD immediately  if symptoms less severe.  You Must read complete instructions/literature along with all the possible adverse reactions/side effects for all the Medicines you take and that have been prescribed to you. Take any new Medicines after you have completely understood and accpet all the possible adverse reactions/side effects.   Do not drive, operating heavy machinery, perform activities at heights, swimming or participation in water activities or provide baby sitting services if your were admitted for syncope or siezures until you have seen by Primary MD or a Neurologist and advised to do so again.  Do not drive when taking Pain medications.    Do not take more than prescribed Pain, Sleep and Anxiety Medications  Special Instructions: If you have smoked or chewed Tobacco  in the last 2 yrs please stop smoking, stop any regular Alcohol  and or any Recreational drug use.  Wear Seat belts while driving.   Please note  You were cared for by a hospitalist during your hospital stay. If you have any questions about your discharge medications  or the care you received while you were in the hospital after you are discharged, you can call the unit and asked to speak with the hospitalist on call if the hospitalist that took care of you is not available. Once you are discharged, your primary care physician will handle any further medical issues. Please note that NO REFILLS for any discharge medications will be authorized once you are discharged, as it is imperative that you return to your primary care physician (or establish a relationship with a primary care physician if you do not have one) for your aftercare needs so that they can reassess your need for medications and monitor your lab values.

## 2018-12-16 NOTE — Progress Notes (Signed)
Reviewed AVS discharge instructions with patient/caregiver. Patient/caregiver verbalizes understanding of instructions received. AVS and prescriptions received by patient/caregiver. If present, telemetry box removed and central cardiac monitoring department notified of discharge. Peripheral IV removed, site benign with tip intact. Transitions of care pharmacy delivered medications while RN at bedside. Patient ready for discharge, awaiting ride home.

## 2018-12-16 NOTE — Discharge Summary (Signed)
Linda [name], is a 85 y.o. female  DOB March 26, 1932  MRN [id number].  Admission date:  12/14/2018  Admitting Physician  Karmen Bongo, MD  Discharge Date:  12/16/2018   Primary MD  Gayland Curry, DO  Recommendations for primary care physician for things to follow:  -Check CBC, BMP during next visit -Repeat two-view chest x-ray in 3 to 4 weeks   Admission Diagnosis  Hypoxia [R09.02] Community acquired pneumonia of left lower lobe of lung ([place]) [J18.1] CAP (community acquired pneumonia) [J18.9]   Discharge Diagnosis  Hypoxia [R09.02] Community acquired pneumonia of left lower lobe of lung ([place]) [J18.1] CAP (community acquired pneumonia) [J18.9]    Principal Problem:   CAP (community acquired pneumonia) Active Problems:   Hypertension, essential, benign   Incomplete emptying of bladder      Past Medical History:  Diagnosis Date  . Allergy   . Anxiety   . Arthritis   . Bursitis    right leg  . GERD (gastroesophageal reflux disease)   . History of blood transfusion   . Hyperlipidemia   . Hypertension   . Insomnia   . Non Hodgkin's lymphoma ([place]) 2017-2019   cured  . Osteopenia    MILD  . Urine retention   . Uterine cancer ([place])    at age 28    Past Surgical History:  Procedure Laterality Date  . ABDOMINAL HYSTERECTOMY     AGE 34  . APPENDECTOMY     AGE 52  . BLADDER SURGERY    . Catarct Surgery    . ECTOPIC PREGNANCY SURGERY     age 30  . LAPAROSCOPIC SMALL BOWEL RESECTION    . NM PET DX LYMPHOMA  10/2018   IN REMISSION  . occular pressure    . OTHER SURGICAL HISTORY    . REPLACEMENT TOTAL KNEE BILATERAL    . TONSILLECTOMY AND ADENOIDECTOMY     age 46 or 45  . UTERINE CANCER SURGERY     age 63- located in vagina       History of present illness and  Hospital Course:     Kindly see H&P for history of present illness and admission details, please review complete  Labs, Consult reports and Test reports for all details in brief  HPI  from the history and physical done on the day of admission 12/14/2018  HPI: Linda [name] is a 85 y.o. female with medical history significant of remote uterine cancer; NHL; HTN; and HLD presenting with emesis.  She moved to [place] about a year ago.  About 2-3 weeks ago, she had a spinal epidural injection - her sciatica magically disappeared.  She was able to go to [facility] - she flew out Christmas Day and had a great time.  She started back on 12/29 and connected through [place].  When she got off the plane, she felt fecal urgency and had a loose stool.  When she came out of the bathroom, she got nauseated and dizzy and pre-syncopal.  She  got to the gate but was not able to get on the plane.  The medics took her to the ER in [place], [place].  She stayed in the ER for many hours and they told her she was dehydrated; since she was alone and travelling they observed her overnight.  They discharged her about 10am and she was feeling better.  She went to the airport and flew straight home.  She was coughing and felt miserable.  She got in bed and slept for 3 hours.  She woke about 0200 with chills, nausea, feeling miserable.  She contacted the staff at [facility] and the nurse came up and checked her.  She was complaining of left flank/back pain and the nurse decided to send her in.  Last night she had a fever to 100.2.  +cough, rhinorrhea.  Cough is nonproductive.  +hoarseness.  She has intermittent diarrhea after her bowel resection, and this always makes her weak.  She has not had any loose stools in the last 24 hours.  Bladder surgery in the past (15+ years ago), self-caths - but does not have h/o urinary infections.   ED Course: [place], per Dr. Hal Hope:  85 year old female with history of hypertension just traveling back from [place] has been having fever chills productive cough. On route patient was admitted at [place] for 1  day but patient got discharged and came back to [place]. Since patient has been not feeling well with productive cough nausea vomiting came back to the ER. In the ER patient is having a oxygen requirement and generally not feeling well chest x-ray showing infiltrates concerning for pneumonia with fever admitted for observation.   Hospital Course    85 y.o.femalewith medical history significant ofremote uterine cancer; NHL; HTN; and HLD presenting with emesis, shortness of breath, cough, congestion, she was noted to have fever, work-up significant for community-acquired pneumonia, admitted for further treatment   CAP - presents with fever of 100.5, chest x-ray significant for multifocal pneumonia, influenza negative. -He is improving, nontoxic-appearing, she was treated with IV Rocephin and azithromycin during hospital stay, she will be discharged today on oral regimen of daptomycin and Vantin -Was encouraged to take incentive spirometry and flutter valve with her, and keep using them frequently, she will be discharged on Mucinex as well, respiratory panel is negative.   HTN -Continue Cozaar  Incomplete bladder emptying -Self-caths routinely -Will add I/O caths -Urine does not appear to be infected   Discharge Condition:  Stable   Follow UP  Follow-up Information    Reed, Tiffany L, DO Follow up in 1 week(s).   Specialty:  Geriatric Medicine Contact information: [address] [place] [address] [phone]             Discharge Instructions  and  Discharge Medications     Discharge Instructions    Discharge instructions   Complete by:  As directed    Follow with Primary MD Hollace Kinnier L, DO in 7 days   Get CBC, CMP, 2 view Chest X ray checked  by Primary MD next visit.    Activity: As tolerated with Full fall precautions use walker/cane & assistance as needed   Disposition [facility]   Diet: Heart Healthy  , with feeding assistance  and aspiration precautions.   On your next visit with your primary care physician please Get Medicines reviewed and adjusted.   Please request your Prim.MD to go over all Hospital Tests and Procedure/Radiological results at the follow up,  please get all Hospital records sent to your Prim MD by signing hospital release before you go home.   If you experience worsening of your admission symptoms, develop shortness of breath, life threatening emergency, suicidal or homicidal thoughts you must seek medical attention immediately by calling 911 or calling your MD immediately  if symptoms less severe.  You Must read complete instructions/literature along with all the possible adverse reactions/side effects for all the Medicines you take and that have been prescribed to you. Take any new Medicines after you have completely understood and accpet all the possible adverse reactions/side effects.   Do not drive, operating heavy machinery, perform activities at heights, swimming or participation in water activities or provide baby sitting services if your were admitted for syncope or siezures until you have seen by Primary MD or a Neurologist and advised to do so again.  Do not drive when taking Pain medications.    Do not take more than prescribed Pain, Sleep and Anxiety Medications  Special Instructions: If you have smoked or chewed Tobacco  in the last 2 yrs please stop smoking, stop any regular Alcohol  and or any Recreational drug use.  Wear Seat belts while driving.   Please note  You were cared for by a hospitalist during your hospital stay. If you have any questions about your discharge medications or the care you received while you were in the hospital after you are discharged, you can call the unit and asked to speak with the hospitalist on call if the hospitalist that took care of you is not available. Once you are discharged, your primary care physician will handle any further medical  issues. Please note that NO REFILLS for any discharge medications will be authorized once you are discharged, as it is imperative that you return to your primary care physician (or establish a relationship with a primary care physician if you do not have one) for your aftercare needs so that they can reassess your need for medications and monitor your lab values.   Increase activity slowly   Complete by:  As directed      Allergies as of 12/16/2018   No Known Allergies     Medication List    TAKE these medications   acetaminophen 500 MG tablet Commonly known as:  TYLENOL Take 500 mg by mouth every 6 (six) hours as needed.   azithromycin 500 MG tablet Commonly known as:  ZITHROMAX Take 1 tablet (500 mg total) by mouth daily. Start taking on:  December 17, 2018   bimatoprost 0.01 % Soln Commonly known as:  LUMIGAN Place 1 drop into both eyes at bedtime.   brimonidine 0.15 % ophthalmic solution Commonly known as:  ALPHAGAN Place 1 drop into both eyes 3 (three) times daily.   cefpodoxime 200 MG tablet Commonly known as:  VANTIN Take 1 tablet (200 mg total) by mouth 2 (two) times daily. Start taking on:  December 17, 2018   cetirizine 10 MG tablet Commonly known as:  ZYRTEC Take 10 mg by mouth at bedtime.   diazepam 5 MG tablet Commonly known as:  VALIUM Take 1 by mouth 1 hour  pre-procedure with very light food. May bring 2nd tablet to appointment.   eszopiclone 2 MG Tabs tablet Commonly known as:  LUNESTA Take 1 tablet (2 mg total) by mouth at bedtime. Take immediately before bedtime   gabapentin 100 MG capsule Commonly known as:  NEURONTIN Take two capsules by mouth once daily at bedtime What  changed:    how much to take  how to take this  when to take this  reasons to take this  additional instructions   guaiFENesin 600 MG 12 hr tablet Commonly known as:  MUCINEX Take 2 tablets (1,200 mg total) by mouth 2 (two) times daily.   loperamide 2 MG capsule Commonly  known as:  IMODIUM Take 2 mg by mouth as needed for diarrhea or loose stools.   losartan 50 MG tablet Commonly known as:  COZAAR Take 1 tablet (50 mg total) by mouth daily.   nitrofurantoin 50 MG capsule Commonly known as:  MACRODANTIN Take 1 capsule (50 mg total) by mouth daily. Start taking on:  December 20, 2018 What changed:  These instructions start on December 20, 2018. If you are unsure what to do until then, ask your doctor or other care provider.   rosuvastatin 5 MG tablet Commonly known as:  CRESTOR Take 1 tablet (5 mg total) by mouth at bedtime. What changed:  when to take this         Diet and Activity recommendation: See Discharge Instructions above   Consults obtained -  None   Major procedures and Radiology Reports - PLEASE review detailed and final reports for all details, in brief -      Dg Chest 2 View  Result Date: 12/14/2018 CLINICAL DATA:  Acute onset of cough and shortness of breath. EXAM: CHEST - 2 VIEW COMPARISON:  None. FINDINGS: The lungs are well-aerated. Left perihilar airspace opacification raises concern for pneumonia. There is no evidence of pleural effusion or pneumothorax. The heart is normal in size; the mediastinal contour is within normal limits. No acute osseous abnormalities are seen. IMPRESSION: Left perihilar airspace opacification raises concern for pneumonia. Electronically Signed   By: Garald Balding M.D.   On: 12/14/2018 03:33   Xr C-arm No Report  Result Date: 11/17/2018 Please see Notes tab for imaging impression.   Micro Results    Recent Results (from the past 240 hour(s))  Respiratory Panel by PCR     Status: None   Collection Time: 12/14/18  9:21 AM  Result Value Ref Range Status   Adenovirus NOT DETECTED NOT DETECTED Final   Coronavirus 229E NOT DETECTED NOT DETECTED Final   Coronavirus HKU1 NOT DETECTED NOT DETECTED Final   Coronavirus NL63 NOT DETECTED NOT DETECTED Final   Coronavirus OC43 NOT DETECTED NOT  DETECTED Final   Metapneumovirus NOT DETECTED NOT DETECTED Final   Rhinovirus / Enterovirus NOT DETECTED NOT DETECTED Final   Influenza A NOT DETECTED NOT DETECTED Final   Influenza B NOT DETECTED NOT DETECTED Final   Parainfluenza Virus 1 NOT DETECTED NOT DETECTED Final   Parainfluenza Virus 2 NOT DETECTED NOT DETECTED Final   Parainfluenza Virus 3 NOT DETECTED NOT DETECTED Final   Parainfluenza Virus 4 NOT DETECTED NOT DETECTED Final   Respiratory Syncytial Virus NOT DETECTED NOT DETECTED Final   Bordetella pertussis NOT DETECTED NOT DETECTED Final   Chlamydophila pneumoniae NOT DETECTED NOT DETECTED Final   Mycoplasma pneumoniae NOT DETECTED NOT DETECTED Final    Comment: Performed at [facility], [place]. [address] [place], [place] [address]  Culture, blood (routine x 2) Call MD if unable to obtain prior to antibiotics being given     Status: None (Preliminary result)   Collection Time: 12/14/18 11:20 AM  Result Value Ref Range Status   Specimen Description BLOOD SITE NOT SPECIFIED  Final   Special Requests  Final    BOTTLES DRAWN AEROBIC AND ANAEROBIC Blood Culture adequate volume   Culture   Final    NO GROWTH 2 DAYS Performed at [facility], [place] [address], [place], [place] [address]    Report Status PENDING  Incomplete  Culture, blood (routine x 2) Call MD if unable to obtain prior to antibiotics being given     Status: None (Preliminary result)   Collection Time: 12/14/18 11:20 AM  Result Value Ref Range Status   Specimen Description BLOOD BLOOD RIGHT HAND  Final   Special Requests   Final    AEROBIC BOTTLE ONLY Blood Culture results may not be optimal due to an inadequate volume of blood received in culture bottles   Culture   Final    NO GROWTH 2 DAYS Performed at [facility], [place] [address] [place], [place] [address]    Report Status PENDING  Incomplete  Culture, sputum-assessment     Status: None   Collection Time: 12/15/18  4:34 AM  Result Value  Ref Range Status   Specimen Description EXPECTORATED SPUTUM  Final   Special Requests NONE  Final   Sputum evaluation   Final    THIS SPECIMEN IS ACCEPTABLE FOR SPUTUM CULTURE Performed at [facility], [place] [address] [place], [place] [address]    Report Status 12/15/2018 FINAL  Final  Culture, respiratory     Status: None (Preliminary result)   Collection Time: 12/15/18  4:34 AM  Result Value Ref Range Status   Specimen Description EXPECTORATED SPUTUM  Final   Special Requests NONE Reflexed from [id number]  Final   Gram Stain   Final    ABUNDANT WBC PRESENT, PREDOMINANTLY PMN ABUNDANT GRAM NEGATIVE RODS FEW GRAM POSITIVE COCCI    Culture   Final    CULTURE REINCUBATED FOR BETTER GROWTH Performed at [facility], [place] [address] [place], [place] [address]    Report Status PENDING  Incomplete       Today   Subjective:   Lejla Moeser today has no headache,no chest or abdominal pain,no new weakness tingling or numbness, feels much better wants to go home today.  Cough, is more productive currently after using her flutter valve,  Objective:   Blood pressure (!) 159/60, pulse 75, temperature 99.2 F (37.3 C), temperature source Oral, resp. rate 18, height 5\' 4"  (1.626 m), weight 88.5 kg, SpO2 94 %.   Intake/Output Summary (Last 24 hours) at 12/16/2018 1421 Last data filed at 12/16/2018 0900 Gross per 24 hour  Intake 2095 ml  Output -  Net 2095 ml    Exam Awake Alert, Oriented x 3, No new F.N deficits, Normal affect Symmetrical Chest wall movement, Good air movement bilaterally, CTAB RRR,No Gallops,Rubs or new Murmurs, No Parasternal Heave +ve B.Sounds, Abd Soft, Non tender, No rebound -guarding or rigidity. No Cyanosis, Clubbing or edema, No new Rash or bruise  Data Review   CBC w Diff:  Lab Results  Component Value Date   WBC 6.3 12/15/2018   HGB 10.1 (L) 12/15/2018   HCT 32.6 (L) 12/15/2018   PLT 148 (L) 12/15/2018   LYMPHOPCT 16 12/15/2018    MONOPCT 12 12/15/2018   EOSPCT 2 12/15/2018   BASOPCT 0 12/15/2018    CMP:  Lab Results  Component Value Date   NA 139 12/15/2018   K 3.5 12/15/2018   CL 106 12/15/2018   CO2 25 12/15/2018   BUN 8 12/15/2018   CREATININE  0.64 12/15/2018   PROT 6.0 (L) 12/14/2018   ALBUMIN 3.4 (L) 12/14/2018   BILITOT 1.1 12/14/2018   ALKPHOS 83 12/14/2018   AST 29 12/14/2018   ALT 17 12/14/2018  .   Total Time in preparing paper work, data evaluation and todays exam - 65 minutes  Phillips Climes M.D on 12/16/2018 at 2:21 PM  [facility]   Office  [phone]

## 2018-12-17 LAB — CULTURE, RESPIRATORY W GRAM STAIN: Culture: NORMAL

## 2018-12-17 LAB — CULTURE, RESPIRATORY

## 2018-12-19 ENCOUNTER — Emergency Department (HOSPITAL_BASED_OUTPATIENT_CLINIC_OR_DEPARTMENT_OTHER): Payer: Medicare Other

## 2018-12-19 ENCOUNTER — Emergency Department (HOSPITAL_BASED_OUTPATIENT_CLINIC_OR_DEPARTMENT_OTHER)
Admission: EM | Admit: 2018-12-19 | Discharge: 2018-12-19 | Disposition: A | Discharge status: Home / Self Care | Payer: Medicare Other | Attending: Emergency Medicine | Admitting: Emergency Medicine

## 2018-12-19 ENCOUNTER — Encounter (HOSPITAL_BASED_OUTPATIENT_CLINIC_OR_DEPARTMENT_OTHER): Payer: Self-pay | Admitting: *Deleted

## 2018-12-19 ENCOUNTER — Other Ambulatory Visit: Payer: Self-pay

## 2018-12-19 DIAGNOSIS — R609 Edema, unspecified: Secondary | ICD-10-CM | POA: Diagnosis not present

## 2018-12-19 DIAGNOSIS — Z96653 Presence of artificial knee joint, bilateral: Secondary | ICD-10-CM | POA: Diagnosis not present

## 2018-12-19 DIAGNOSIS — M84851 Other disorders of continuity of bone, right pelvic region and thigh: Secondary | ICD-10-CM | POA: Diagnosis not present

## 2018-12-19 DIAGNOSIS — R109 Unspecified abdominal pain: Secondary | ICD-10-CM | POA: Diagnosis not present

## 2018-12-19 DIAGNOSIS — Z8572 Personal history of non-Hodgkin lymphomas: Secondary | ICD-10-CM | POA: Insufficient documentation

## 2018-12-19 DIAGNOSIS — M84451A Pathological fracture, right femur, initial encounter for fracture: Secondary | ICD-10-CM | POA: Diagnosis not present

## 2018-12-19 DIAGNOSIS — K5900 Constipation, unspecified: Secondary | ICD-10-CM | POA: Diagnosis not present

## 2018-12-19 DIAGNOSIS — I10 Essential (primary) hypertension: Secondary | ICD-10-CM | POA: Insufficient documentation

## 2018-12-19 DIAGNOSIS — Z8542 Personal history of malignant neoplasm of other parts of uterus: Secondary | ICD-10-CM | POA: Insufficient documentation

## 2018-12-19 DIAGNOSIS — M84459A Pathological fracture, hip, unspecified, initial encounter for fracture: Secondary | ICD-10-CM | POA: Diagnosis not present

## 2018-12-19 DIAGNOSIS — R1084 Generalized abdominal pain: Secondary | ICD-10-CM | POA: Diagnosis not present

## 2018-12-19 LAB — COMPREHENSIVE METABOLIC PANEL
ALT: 26 U/L (ref 0–44)
AST: 42 U/L — ABNORMAL HIGH (ref 15–41)
Albumin: 3.5 g/dL (ref 3.5–5.0)
Alkaline Phosphatase: 118 U/L (ref 38–126)
Anion gap: 8 (ref 5–15)
BUN: 11 mg/dL (ref 8–23)
CO2: 27 mmol/L (ref 22–32)
CREATININE: 0.58 mg/dL (ref 0.44–1.00)
Calcium: 9 mg/dL (ref 8.9–10.3)
Chloride: 103 mmol/L (ref 98–111)
GFR calc Af Amer: >60 mL/min (ref >60)
GFR calc non Af Amer: >60 mL/min (ref >60)
Glucose, Bld: 100 mg/dL — ABNORMAL HIGH (ref 70–99)
Potassium: 3.4 mmol/L — ABNORMAL LOW (ref 3.5–5.1)
Sodium: 138 mmol/L (ref 135–145)
Total Bilirubin: 0.2 mg/dL — ABNORMAL LOW (ref 0.3–1.2)
Total Protein: 6.7 g/dL (ref 6.5–8.1)

## 2018-12-19 LAB — CBC WITH DIFFERENTIAL/PLATELET
Abs Immature Granulocytes: 0.06 10*3/uL (ref 0.00–0.07)
Basophils Absolute: 0 10*3/uL (ref 0.0–0.1)
Basophils Relative: 0 %
Eosinophils Absolute: 0.2 10*3/uL (ref 0.0–0.5)
Eosinophils Relative: 3 %
HCT: 37.3 % (ref 36.0–46.0)
Hemoglobin: 11.4 g/dL — ABNORMAL LOW (ref 12.0–15.0)
Immature Granulocytes: 1 %
Lymphocytes Relative: 17 %
Lymphs Abs: 1 10*3/uL (ref 0.7–4.0)
MCH: 29.5 pg (ref 26.0–34.0)
MCHC: 30.6 g/dL (ref 30.0–36.0)
MCV: 96.6 fL (ref 80.0–100.0)
MONOS PCT: 11 %
Monocytes Absolute: 0.6 10*3/uL (ref 0.1–1.0)
Neutro Abs: 4 10*3/uL (ref 1.7–7.7)
Neutrophils Relative %: 68 %
Platelets: 229 10*3/uL (ref 150–400)
RBC: 3.86 MIL/uL — ABNORMAL LOW (ref 3.87–5.11)
RDW: 12.4 % (ref 11.5–15.5)
WBC: 5.8 10*3/uL (ref 4.0–10.5)
nRBC: 0 % (ref 0.0–0.2)

## 2018-12-19 LAB — URINALYSIS, MICROSCOPIC (REFLEX): WBC, UA: NONE SEEN WBC/hpf (ref 0–5)

## 2018-12-19 LAB — LIPASE, BLOOD: Lipase: 25 U/L (ref 11–51)

## 2018-12-19 LAB — URINALYSIS, ROUTINE W REFLEX MICROSCOPIC
Bilirubin Urine: NEGATIVE
Glucose, UA: NEGATIVE mg/dL
Ketones, ur: NEGATIVE mg/dL
Leukocytes, UA: NEGATIVE
Nitrite: NEGATIVE
Protein, ur: NEGATIVE mg/dL
Specific Gravity, Urine: <1.005 — ABNORMAL LOW (ref 1.005–1.030)
pH: 6 (ref 5.0–8.0)

## 2018-12-19 LAB — CULTURE, BLOOD (ROUTINE X 2)
CULTURE: NO GROWTH
Culture: NO GROWTH
Special Requests: ADEQUATE

## 2018-12-19 MED ORDER — IOPAMIDOL (ISOVUE-300) INJECTION 61%
100.0000 mL | Freq: Once | INTRAVENOUS | Status: AC | PRN
  Administered 2018-12-19: 100 mL via INTRAVENOUS

## 2018-12-19 MED ORDER — SODIUM CHLORIDE 0.9 % IV BOLUS
500.0000 mL | Freq: Once | INTRAVENOUS | Status: AC
  Administered 2018-12-19: 500 mL via INTRAVENOUS

## 2018-12-19 NOTE — ED Provider Notes (Signed)
Emergency Department Provider Note   I have reviewed the triage vital signs and the nursing notes.   HISTORY  Chief Complaint Abdominal Pain   HPI Linda [name] is a 85 y.o. female with PMH of GERD, HTN, HLD, remote history of uterine cancer, and recent admission for CAP since to the emergency department with abdominal pain and constipation.  The patient has had 2 recent hospitalizations in the last 3 weeks.  She was flying to [place] and connecting in [place] she began having severe diarrhea.  She was unable to board her flight because of generalized weakness and was ultimately admitted for observation overnight.  She received IV fluids and was discharged.  She flew to [facility] the next day but shortly after being here developed fever, fatigue, cough and was diagnosed with community-acquired pneumonia.  She was discharged on January 2 and is on day 5 of antibiotics.  She has had worsening abdominal pain worse in the right lower quadrant over the past 1 to 2 days.  Patient has had constipation.  She does not believe she is passing any flatus.  There is no nausea or vomiting.  No fevers or chills.  Her pneumonia symptoms are improving significantly with antibiotics. Patient lives in independent living at [facility].  They offered to try enemas and have closer monitoring over the next 24 to 48 hours but the patient wanted to rule out bowel obstruction.   Past Medical History:  Diagnosis Date  . Allergy   . Anxiety   . Arthritis   . Bursitis    right leg  . GERD (gastroesophageal reflux disease)   . History of blood transfusion   . Hyperlipidemia   . Hypertension   . Insomnia   . Non Hodgkin's lymphoma ([place]) 2017-2019   cured  . Osteopenia    MILD  . Urine retention   . Uterine cancer ([place])    at age 89    Patient Active Problem List   Diagnosis Date Noted  . CAP (community acquired pneumonia) 12/14/2018  . Spinal stenosis of lumbar region without neurogenic  claudication 12/03/2018  . Lumbar foraminal stenosis 12/03/2018  . 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 Ocular hypertension 10/20/2018  . Seasonal allergies 10/20/2018  . Trochanteric bursitis, right hip 09/14/2018  . Chronic bilateral low back pain 09/14/2018  . Hoarseness of voice 04/08/2018  . Hypertension, essential, benign 01/16/2018  . Sensorineural hearing loss (SNHL) of both ears 01/16/2018  . Insomnia 01/16/2018  . MALT lymphoma ([place]) 01/15/2018  . 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 Osteopenia of both hips 01/15/2018  . Hyperlipidemia 01/15/2018  . DJD (degenerative joint disease) of knee 02/13/2011  . H/O difficult intubation 02/12/2011  . Dry eye syndrome 04/10/2010  . Incomplete emptying of bladder 08/31/2009  . Borderline glaucoma with ocular hypertension 09/07/2008    Past Surgical History:  Procedure Laterality Date  . ABDOMINAL HYSTERECTOMY     AGE 52  . APPENDECTOMY     AGE 58  . BLADDER SURGERY    . Catarct Surgery    . ECTOPIC PREGNANCY SURGERY     age 67  . LAPAROSCOPIC SMALL BOWEL RESECTION    . NM PET DX LYMPHOMA  10/2018   IN REMISSION  . occular pressure    . OTHER SURGICAL HISTORY    . REPLACEMENT TOTAL KNEE BILATERAL    . TONSILLECTOMY AND ADENOIDECTOMY     age 9 or 32  . UTERINE CANCER SURGERY  age 85- located in vagina   Allergies Patient has no known allergies.  Family History  Problem Relation Age of Onset  . Uterine cancer Sister   . COPD Maternal Grandfather   . Heart failure Mother 32  . Pneumonia Father 51  . Dementia Father   . Uterine cancer Maternal Grandmother   . Other Sister        Guillain Barre syndrom  . Kidney cancer Son   . Dementia Paternal Grandmother   . Colon cancer Neg Hx   . Esophageal cancer Neg Hx   . Liver cancer Neg Hx   . Pancreatic cancer Neg Hx   . Rectal cancer Neg Hx   . Stomach cancer Neg Hx     Social History Social History   Tobacco Use  . Smoking status: Never Smoker    . Smokeless tobacco: Never Used  Substance Use Topics  . Alcohol use: Yes    Alcohol/week: 7.0 standard drinks    Types: 7 Glasses of wine per week    Comment: one glass of wine with dinner  . Drug use: No    Review of Systems  Constitutional: No fever/chills Eyes: No visual changes. ENT: No sore throat. Cardiovascular: Denies chest pain. Respiratory: Denies shortness of breath. Gastrointestinal: Positive abdominal pain.  No nausea, no vomiting.  No diarrhea. Positive constipation. Genitourinary: Negative for dysuria. Musculoskeletal: Negative for back pain. Skin: Negative for rash. Neurological: Negative for headaches, focal weakness or numbness.  10-point ROS otherwise negative.  ____________________________________________   PHYSICAL EXAM:  VITAL SIGNS: ED Triage Vitals  Enc Vitals Group     BP 12/19/18 1635 (!) 182/86     Pulse Rate 12/19/18 1635 69     Resp 12/19/18 1635 16     Temp 12/19/18 1635 98.7 F (37.1 C)     Temp Source 12/19/18 1635 Oral     SpO2 12/19/18 1635 91 %     Weight 12/19/18 1636 195 lb (88.5 kg)     Height 12/19/18 1636 5' 4"  (1.626 m)     Pain Score 12/19/18 1642 7   Constitutional: Alert and oriented. Well appearing and in no acute distress. Eyes: Conjunctivae are normal.  Head: Atraumatic. Nose: No congestion/rhinnorhea. Mouth/Throat: Mucous membranes are moist. Neck: No stridor.   Cardiovascular: Normal rate, regular rhythm. Good peripheral circulation. Grossly normal heart sounds.   Respiratory: Normal respiratory effort.  No retractions. Lungs CTAB. Gastrointestinal: Soft with mild/moderate lower abdominal tenderness worse on the right. No rebound or guarding. No distention.  Musculoskeletal: No lower extremity tenderness nor edema. No gross deformities of extremities. Neurologic:  Normal speech and language. No gross focal neurologic deficits are appreciated.  Skin:  Skin is warm, dry and intact. No rash  noted.  ____________________________________________   LABS (all labs ordered are listed, but only abnormal results are displayed)  Labs Reviewed  COMPREHENSIVE METABOLIC PANEL - Abnormal; Notable for the following components:      Result Value   Potassium 3.4 (*)    Glucose, Bld 100 (*)    AST 42 (*)    Total Bilirubin 0.2 (*)    All other components within normal limits  CBC WITH DIFFERENTIAL/PLATELET - Abnormal; Notable for the following components:   RBC 3.86 (*)    Hemoglobin 11.4 (*)    All other components within normal limits  URINALYSIS, ROUTINE W REFLEX MICROSCOPIC - Abnormal; Notable for the following components:   Specific Gravity, Urine <1.005 (*)    Hgb urine dipstick  TRACE (*)    All other components within normal limits  URINALYSIS, MICROSCOPIC (REFLEX) - Abnormal; Notable for the following components:   Bacteria, UA RARE (*)    All other components within normal limits  LIPASE, BLOOD   ____________________________________________  RADIOLOGY  Ct Abdomen Pelvis W Contrast  Result Date: 12/19/2018 CLINICAL DATA:  Bowel obstruction, acute abdominal pain EXAM: CT ABDOMEN AND PELVIS WITH CONTRAST TECHNIQUE: Multidetector CT imaging of the abdomen and pelvis was performed using the standard protocol following bolus administration of intravenous contrast. Sagittal and coronal MPR images reconstructed from axial data set. CONTRAST:  158m ISOVUE-300 IOPAMIDOL (ISOVUE-300) INJECTION 61% IV. No oral contrast. COMPARISON:  None FINDINGS: Lower chest: Small bibasilar pleural effusions and adjacent compressive atelectasis of the lower lobes. Hepatobiliary: Probable focal fatty infiltration of liver adjacent to falciform fissure. Tiny calcified hepatic granulomata. Gallbladder and liver otherwise normal appearance. Pancreas: Normal appearance Spleen: Tiny calcified granulomata.  Otherwise normal appearance. Adrenals/Urinary Tract: Adrenal glands normal appearance. Extrarenal pelvis  RIGHT kidney. Kidneys, ureters, and bladder otherwise normal appearance. Stomach/Bowel: Mildly prominent stool throughout colon. Stomach decompressed with question small hiatal hernia. Bowel anastomosis in the LEFT mid abdomen. No definite evidence of bowel obstruction or bowel dilatation. No bowel wall thickening. Infiltrative changes of presacral and perirectal fat, nonspecific, without associated rectal wall changes. Remaining bowel loops unremarkable. Vascular/Lymphatic: Atherosclerotic calcifications aorta and iliac arteries. Aorta normal caliber. Normal sized celiac axis node 8 mm diameter image 20. No abdominal or pelvic adenopathy. Reproductive: Uterus surgically absent. Nonvisualization of ovaries. Other: No free air or free fluid.  No hernia. Musculoskeletal: Bones demineralized. Degenerative disc and facet disease changes lumbar spine with minimal anterolisthesis at L4-L5. Mixed lytic and sclerotic process involving the RIGHT acetabulum associated with a nondisplaced acetabular fracture, question pathologic fracture through an underlying bone lesion or metastatic focus or sequela of subacute trauma/fracture. IMPRESSION: Mild perirectal and presacral edema without associated rectal wall thickening or definite inflammatory changes; these findings are nonspecific, could be related to subtle proctitis or other causes. Mildly increased stool throughout colon. Mixed lytic and sclerotic appearance of the RIGHT acetabulum with associated nondisplaced fracture, question pathologic fracture through an underlying bone lesion or metastatic focus versus sequela of a subacute fracture. Electronically Signed   By: MLavonia DanaM.D.   On: 12/19/2018 18:50    ____________________________________________   PROCEDURES  Procedure(s) performed:   Procedures  None ____________________________________________   INITIAL IMPRESSION / ASSESSMENT AND PLAN / ED COURSE  Pertinent labs & imaging results that were  available during my care of the patient were reviewed by me and considered in my medical decision making (see chart for details).  Patient presents to the emergency department for evaluation of abdominal pain with constipation.  She does have some mild to moderate tenderness in the right lower quadrant.  No vomiting.  Given the patient's age and focal tenderness I do plan for CT imaging of the abdomen and pelvis to rule out bowel obstruction.  Will obtain baseline labs and reassess.   Labs without acute findings. No leukocytosis. UA negative. Mild hypokalemia. CT reviewed with moderate constipation but acetabular fracture, non-displaced, and question sclerotic lesion. Spoke with Dr. YLorin Mercy with Ortho, who sees the patient in clinic. Advises PCP for Onc referral for met w/u and imaging. In terms of the fracture the patient should use a walker for protected weight bearing and f/u with his office in 1 week.   Discussed results with the patient and son in detail. Patient can  use laxative at independent living. Wrote DME Rx for walker. Patient and son understand the mgmt plan and f/u. Patient not having significant pain at this time. She has been ambulatory on the right hip since pain began. Discussed ED return precautions.    ____________________________________________  FINAL CLINICAL IMPRESSION(S) / ED DIAGNOSES  Final diagnoses:  Generalized abdominal pain  Constipation, unspecified constipation type  Pathological fracture of right hip, unspecified pathological cause, initial encounter [facility])    MEDICATIONS GIVEN DURING THIS VISIT:  Medications  sodium chloride 0.9 % bolus 500 mL (0 mLs Intravenous Stopped 12/19/18 1937)  iopamidol (ISOVUE-300) 61 % injection 100 mL (100 mLs Intravenous Contrast Given 12/19/18 1818)    Note:  This document was prepared using Dragon voice recognition software and may include unintentional dictation errors.  Nanda Quinton, MD Emergency Medicine    Long, W[facility], MD 12/19/18 [phone]

## 2018-12-19 NOTE — ED Notes (Signed)
Patient does her own in and out cath for too long and refused to have the staff catheterized her to obtained urine specimen.  Patient stated that since she had the radiation, it messed up her bladder and small intestine.

## 2018-12-19 NOTE — ED Notes (Signed)
Pt stated that she felt like is is constipated and "hurts" down here, (touching her rectal area).

## 2018-12-19 NOTE — Discharge Instructions (Signed)
You were seen in the ED today with abdominal discomfort and constipation. There is no bowel obstruction. You can take laxatives and enemas as needed for this. We did find a lesion on the right hip with an fracture near the pelvis. I spoke with Dr. Lorin Mercy who wants you to use a walker and keep full weight off the right hip. Call his office to schedule an appointment for the next 7-10 days. Talk with your PCP about your hip lesion and they can direct you to the appropriate specialists and likely order additional testing.   Return to the ED with any fever, chills, sudden worsening pain, or falls.

## 2018-12-19 NOTE — ED Triage Notes (Signed)
Pt reports 2 hospitalizations in the last week for diarrhea and pneumonia. States she began having abd pain today at 2. Was eval by RN at [facility] and advised to come to ED to r/o obstruction

## 2018-12-19 NOTE — ED Notes (Signed)
ED Provider at bedside. 

## 2018-12-21 ENCOUNTER — Ambulatory Visit: Payer: Medicare Other | Admitting: Gastroenterology

## 2018-12-21 ENCOUNTER — Encounter

## 2018-12-22 ENCOUNTER — Encounter: Payer: Self-pay | Admitting: Internal Medicine

## 2018-12-22 ENCOUNTER — Ambulatory Visit (INDEPENDENT_AMBULATORY_CARE_PROVIDER_SITE_OTHER): Payer: Medicare Other | Admitting: Orthopaedic Surgery

## 2018-12-22 ENCOUNTER — Encounter (INDEPENDENT_AMBULATORY_CARE_PROVIDER_SITE_OTHER): Payer: Self-pay | Admitting: Orthopaedic Surgery

## 2018-12-22 ENCOUNTER — Encounter: Payer: Medicare Other | Admitting: Internal Medicine

## 2018-12-22 ENCOUNTER — Non-Acute Institutional Stay (SKILLED_NURSING_FACILITY): Payer: Medicare Other | Admitting: Internal Medicine

## 2018-12-22 ENCOUNTER — Telehealth: Payer: Self-pay | Admitting: Hematology

## 2018-12-22 VITALS — BP 144/74 | Ht 64.5 in | Wt 195.0 lb

## 2018-12-22 DIAGNOSIS — M48061 Spinal stenosis, lumbar region without neurogenic claudication: Secondary | ICD-10-CM | POA: Diagnosis not present

## 2018-12-22 DIAGNOSIS — M25551 Pain in right hip: Secondary | ICD-10-CM

## 2018-12-22 DIAGNOSIS — M85852 Other specified disorders of bone density and structure, left thigh: Secondary | ICD-10-CM | POA: Diagnosis not present

## 2018-12-22 DIAGNOSIS — C884 Extranodal marginal zone B-cell lymphoma of mucosa-associated lymphoid tissue [MALT-lymphoma]: Secondary | ICD-10-CM | POA: Diagnosis not present

## 2018-12-22 DIAGNOSIS — M85851 Other specified disorders of bone density and structure, right thigh: Secondary | ICD-10-CM

## 2018-12-22 DIAGNOSIS — R339 Retention of urine, unspecified: Secondary | ICD-10-CM | POA: Diagnosis not present

## 2018-12-22 DIAGNOSIS — K520 Gastroenteritis and colitis due to radiation: Secondary | ICD-10-CM | POA: Diagnosis not present

## 2018-12-22 DIAGNOSIS — M7061 Trochanteric bursitis, right hip: Secondary | ICD-10-CM | POA: Diagnosis not present

## 2018-12-22 DIAGNOSIS — M84454A Pathological fracture, pelvis, initial encounter for fracture: Secondary | ICD-10-CM

## 2018-12-22 DIAGNOSIS — C52 Malignant neoplasm of vagina: Secondary | ICD-10-CM | POA: Diagnosis not present

## 2018-12-22 DIAGNOSIS — J189 Pneumonia, unspecified organism: Secondary | ICD-10-CM

## 2018-12-22 NOTE — Progress Notes (Signed)
Office Visit Note   Patient: Linda [name]           Date of Birth: 03-13-1932           MRN: [id number] Visit Date: 12/22/2018              Requested by: Gayland Curry, DO [address] [place], [place] [address] PCP: Gayland Curry, DO   Assessment & Plan: Visit Diagnoses:  1. Pain in right hip   2.    Acetabular nondisplaced fracture with mixed lytic sclerotic lesion above the hip joint.  Plan: We will send her to Dr. Alvy Bimler for work-up of likely metastatic lesion.  50% weightbearing with walker at [facility] where she currently is in rehab.  Follow-Up Instructions: No follow-ups on file.   Orders:  No orders of the defined types were placed in this encounter.  No orders of the defined types were placed in this encounter.     Procedures: No procedures performed   Clinical Data: No additional findings.   Subjective: Chief Complaint  Patient presents with  . Right Hip - Fracture    HPI 85-year-old female are seen in the past for back problems which are doing better presents after being seen in the emergency room with past history of GERD, hypertension remote history of uterine cancer and problems with abdominal pain and constipation.  Patient was in the emergency room for problems with severe diarrhea and abdominal pain.  Review of Systems positive for previous hysterectomy and later diagnosis with uterine cancer of the vagina.  This was about 20 years ago.  Positive history for MALT lymphoma based on biopsies done in [place].  Possible lumbar disc degeneration.   Objective: Vital Signs: BP (!) 144/74 (BP Location: Right Arm, Patient Position: Sitting)   Ht 5' 4.5" (1.638 m)   Wt 195 lb (88.5 kg)   BMI 32.95 kg/m   Physical Exam Constitutional:      Appearance: She is well-developed.  HENT:     Head: Normocephalic.     Right Ear: External ear normal.     Left Ear: External ear normal.  Eyes:     Pupils: Pupils are equal, round, and reactive to  light.  Neck:     Thyroid: No thyromegaly.     Trachea: No tracheal deviation.  Cardiovascular:     Rate and Rhythm: Normal rate.  Pulmonary:     Effort: Pulmonary effort is normal.  Abdominal:     Palpations: Abdomen is soft.  Skin:    General: Skin is warm and dry.  Neurological:     Mental Status: She is alert and oriented to person, place, and time.  Psychiatric:        Behavior: Behavior normal.     Ortho Exam patient is in a wheelchair.  Good capillary refill of the lower extremities.  Specialty Comments:  No specialty comments available.  Imaging:CLINICAL DATA:  Bowel obstruction, acute abdominal pain  EXAM: CT ABDOMEN AND PELVIS WITH CONTRAST  TECHNIQUE: Multidetector CT imaging of the abdomen and pelvis was performed using the standard protocol following bolus administration of intravenous contrast. Sagittal and coronal MPR images reconstructed from axial data set.  CONTRAST:  11mL ISOVUE-300 IOPAMIDOL (ISOVUE-300) INJECTION 61% IV. No oral contrast.  COMPARISON:  None  FINDINGS: Lower chest: Small bibasilar pleural effusions and adjacent compressive atelectasis of the lower lobes.  Hepatobiliary: Probable focal fatty infiltration of liver adjacent to falciform fissure. Tiny calcified hepatic granulomata. Gallbladder and liver  otherwise normal appearance.  Pancreas: Normal appearance  Spleen: Tiny calcified granulomata.  Otherwise normal appearance.  Adrenals/Urinary Tract: Adrenal glands normal appearance. Extrarenal pelvis RIGHT kidney. Kidneys, ureters, and bladder otherwise normal appearance.  Stomach/Bowel: Mildly prominent stool throughout colon. Stomach decompressed with question small hiatal hernia. Bowel anastomosis in the LEFT mid abdomen. No definite evidence of bowel obstruction or bowel dilatation. No bowel wall thickening. Infiltrative changes of presacral and perirectal fat, nonspecific, without associated rectal wall  changes. Remaining bowel loops unremarkable.  Vascular/Lymphatic: Atherosclerotic calcifications aorta and iliac arteries. Aorta normal caliber. Normal sized celiac axis node 8 mm diameter image 20. No abdominal or pelvic adenopathy.  Reproductive: Uterus surgically absent. Nonvisualization of ovaries.  Other: No free air or free fluid.  No hernia.  Musculoskeletal: Bones demineralized. Degenerative disc and facet disease changes lumbar spine with minimal anterolisthesis at L4-L5. Mixed lytic and sclerotic process involving the RIGHT acetabulum associated with a nondisplaced acetabular fracture, question pathologic fracture through an underlying bone lesion or metastatic focus or sequela of subacute trauma/fracture.  IMPRESSION: Mild perirectal and presacral edema without associated rectal wall thickening or definite inflammatory changes; these findings are nonspecific, could be related to subtle proctitis or other causes.  Mildly increased stool throughout colon.  Mixed lytic and sclerotic appearance of the RIGHT acetabulum with associated nondisplaced fracture, question pathologic fracture through an underlying bone lesion or metastatic focus versus sequela of a subacute fracture.   Electronically Signed   By: Lavonia Dana M.D.   On: 12/19/2018 18:50    PMFS History: Patient Active Problem List   Diagnosis Date Noted  . CAP (community acquired pneumonia) 12/14/2018  . Spinal stenosis of lumbar region without neurogenic claudication 12/03/2018  . Lumbar foraminal stenosis 12/03/2018  . 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 Ocular hypertension 10/20/2018  . Seasonal allergies 10/20/2018  . Trochanteric bursitis, right hip 09/14/2018  . Chronic bilateral low back pain 09/14/2018  . Hoarseness of voice 04/08/2018  . Hypertension, essential, benign 01/16/2018  . Sensorineural hearing loss (SNHL) of both ears 01/16/2018  . Insomnia 01/16/2018  . MALT lymphoma  ([place]) 01/15/2018  . 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 Osteopenia of both hips 01/15/2018  . Hyperlipidemia 01/15/2018  . DJD (degenerative joint disease) of knee 02/13/2011  . H/O difficult intubation 02/12/2011  . Dry eye syndrome 04/10/2010  . Incomplete emptying of bladder 08/31/2009  . Borderline glaucoma with ocular hypertension 09/07/2008   Past Medical History:  Diagnosis Date  . Allergy   . Anxiety   . Arthritis   . Bursitis    right leg  . GERD (gastroesophageal reflux disease)   . History of blood transfusion   . Hyperlipidemia   . Hypertension   . Insomnia   . Non Hodgkin's lymphoma ([place]) 2017-2019   cured  . Osteopenia    MILD  . Urine retention   . Uterine cancer ([place])    at age 44    Family History  Problem Relation Age of Onset  . Uterine cancer Sister   . COPD Maternal Grandfather   . Heart failure Mother 95  . Pneumonia Father 54  . Dementia Father   . Uterine cancer Maternal Grandmother   . Other Sister        Guillain Barre syndrom  . Kidney cancer Son   . Dementia Paternal Grandmother   . Colon cancer Neg Hx   . Esophageal cancer Neg Hx   . Liver cancer Neg Hx   .  Pancreatic cancer Neg Hx   . Rectal cancer Neg Hx   . Stomach cancer Neg Hx     Past Surgical History:  Procedure Laterality Date  . ABDOMINAL HYSTERECTOMY     AGE 34  . APPENDECTOMY     AGE 80  . BLADDER SURGERY    . Catarct Surgery    . ECTOPIC PREGNANCY SURGERY     age 75  . LAPAROSCOPIC SMALL BOWEL RESECTION    . NM PET DX LYMPHOMA  10/2018   IN REMISSION  . occular pressure    . OTHER SURGICAL HISTORY    . REPLACEMENT TOTAL KNEE BILATERAL    . TONSILLECTOMY AND ADENOIDECTOMY     age 82 or 75  . UTERINE CANCER SURGERY     age 42- located in vagina   Social History   Occupational History  . Occupation: retired  Tobacco Use  . Smoking status: Never Smoker  . Smokeless tobacco: Never Used  Substance and Sexual Activity  . Alcohol use: Yes     Alcohol/week: 7.0 standard drinks    Types: 7 Glasses of wine per week    Comment: one glass of wine with dinner  . Drug use: No  . Sexual activity: Never

## 2018-12-22 NOTE — Telephone Encounter (Signed)
Received a call from Manuela Schwartz from Dr. Cyndi Lennert office to schedule an appt for pt to be seen for oncology. Pt was seen in ED and had a CT of abd, suspicious for metastatic disease. Pt has been scheduled to see Dr. Irene Limbo on 1/13 at [place] Ms. Gervasi should arrive 30 minutes early. Per Manuela Schwartz pt is a resident at [facility]. She will notify them of the appt.

## 2018-12-22 NOTE — Progress Notes (Signed)
Provider:  Rexene Edison. Mariea Clonts, D.O., C.M.D. Location:  [facility] Room Number: Bruce of Service:  SNF (31)  PCP: Gayland Curry, DO Patient Care Team: Gayland Curry, DO as PCP - General (Geriatric Medicine) Rolm Bookbinder, MD as Consulting Physician (Dermatology) Marybelle Killings, MD as Consulting Physician (Orthopedic Surgery) Jola Schmidt, MD as Consulting Physician (Ophthalmology) Milus Banister, MD as Attending Physician (Gastroenterology) Regal, Tamala Fothergill, DPM as Consulting Physician (Podiatry) Lucas Mallow, MD as Consulting Physician (Urology) Jodi Marble, MD as Consulting Physician (Otolaryngology)  Extended Emergency Contact Information Primary Emergency Contact: [name] Address: [address]          [place], [place] [address] [name] of [place] Phone: [phone] Relation: Son Secondary Emergency Contact: [name] Address: [address] [place] [address] [name] of [place] Mobile Phone: [phone] Relation: Son  Goals of Care: Advanced Directive information Advanced Directives 12/19/2018  Does Patient Have a Medical Advance Directive? Yes  Type of Advance Directive -  Does patient want to make changes to medical advance directive? -  Copy of [place] in Chart? -  has living will and hcpoa on file in documents  Chief Complaint  Patient presents with  . New Admit To SNF    Rehab admission    HPI: Patient is a 85 y.o. female independent resident with h/o lumbar spinal stenosis, right hip trochanteric bursitis, ocular htn, urinary retention with I/O caths since after bladder surgery following radiation for vaginal vault ca at age 22, osteoarthritis, osteoporosis, insomnia, prior bowel obstruction related to constipation and too much fiber to treat it, NHL/MALT lymphoma reportedly cured on endoscopy with biopsies in Feb 2019--seen today for  admission to [facility] rehab s/p two hospitalizations over just a couple of weeks' time.    She initially began feeling bad when she was on her way back from visiting her son in [place].  She had fecal urgency and loose stool (not a brand new issue--she'd reported some difficulty with intermittent diarrhea since her bowel resection, but wore lately at our new pt appt and I'd recommended GI f/u).  She did have a loose BM during her connection in [place].  She was then nauseous, dizzy and pre-syncopal.  She got to the gate, but felt too bad to board and was taken to the ED in [place], [place].  She was told she was dehydrated and was observed overnight since she was traveling alone.  The next morning, she felt a bit better and went to the airport and flew straight home and went to bed.  She then woke up in the middle of the night with chills, nausea, cough.  The [facility] nurse found a fever of 100.2, cough and rhinorrhea so she sent her out to the ED 12/31.  ED imaging revealed left perihilar airspace opacification concerning for pneumonia.  She was flu and viral panel negative.  She improved with IV rocephin and azithromycin and was [place] back to [place] apt on 12/16/2018 with oral azithromycin and vantin, incentive spirometer and flutter valve, mucinex.    12/19/2018, on day 5 of abx, she developed worsening abdominal pain in her RLQ over 1-2 days and constipation.  She had not been passing flatus, no nausea, vomiting, fever or chills.  She denied really ever having cough and congestion (in terms of the pneumonia she had just been treated  for).  Enemas were offered at [facility] but she wanted to visit the ED to r/o bowel obstruction.  BP was high in the ED.  She underwent an abdominal/pelvic CT which revealed mild perirectal and presacral edema without rectal wall thickening or definite inflammation, but possibly subtle proctitis.  She had mild increased stool burden.  Incidentally, a mixed lytic and sclerotic  appearing area was noted on her RIGHT acetabulum with associated nondisplaced fracture (? pathologic--metastatic vs sequela of subacute fx).  Pt did have osteoporosis also on last bone density, but had no injury and she was not convinced the report was correct of her bone density.  She has a f/u about this finding later today with Dr. Lorin Mercy.  Today, she is feeling much better in terms of nausea, cough, fevers.  She is using her incentive spirometer and flutter valve.  She continues to have some intermittent discomfort in her right lower quadrant and in her right sacroiliac and buttock region, but nothing currently around the acetabulum or femur.  She was given orders from orthopedics to ambulate carefully with her walker and we've opted for her to stay in rehab to avoid overdoing and further injuring her hip at least until we sort out the etiology of the abnormal bony finding.    I met with her and her son and we discussed needing an oncology visit to work this up which had already been suggested by Dr. Lorin Mercy.  Additionally, she wound up missing her GI appt with Dr. Ardis Hughs due to being in rehab and not knowing she could get transportation to see him given her hip situation.  She has rescheduled this.    Past Medical History:  Diagnosis Date  . Allergy   . Anxiety   . Arthritis   . Bursitis    right leg  . GERD (gastroesophageal reflux disease)   . History of blood transfusion   . Hyperlipidemia   . Hypertension   . Insomnia   . Non Hodgkin's lymphoma ([facility]) 2017-2019   cured  . Osteopenia    MILD  . Urine retention   . Uterine cancer ([place])    at age 89   Past Surgical History:  Procedure Laterality Date  . ABDOMINAL HYSTERECTOMY     AGE 63  . APPENDECTOMY     AGE 28  . BLADDER SURGERY    . Catarct Surgery    . ECTOPIC PREGNANCY SURGERY     age 89  . LAPAROSCOPIC SMALL BOWEL RESECTION    . NM PET DX LYMPHOMA  10/2018   IN REMISSION  . occular pressure    . OTHER SURGICAL HISTORY     . REPLACEMENT TOTAL KNEE BILATERAL    . TONSILLECTOMY AND ADENOIDECTOMY     age 46 or 80  . UTERINE CANCER SURGERY     age 82- located in vagina    Social History   Socioeconomic History  . Marital status: Single    Spouse name: Not on file  . Number of children: Not on file  . Years of education: Not on file  . Highest education level: Not on file  Occupational History  . Occupation: retired  Scientific laboratory technician  . Financial resource strain: Not on file  . Food insecurity:    Worry: Not on file    Inability: Not on file  . Transportation needs:    Medical: Not on file    Non-medical: Not on file  Tobacco Use  . Smoking status: Never Smoker  .  Smokeless tobacco: Never Used  Substance and Sexual Activity  . Alcohol use: Yes    Alcohol/week: 7.0 standard drinks    Types: 7 Glasses of wine per week    Comment: one glass of wine with dinner  . Drug use: No  . Sexual activity: Never  Lifestyle  . Physical activity:    Days per week: Not on file    Minutes per session: Not on file  . Stress: Not on file  Relationships  . Social connections:    Talks on phone: Not on file    Gets together: Not on file    Attends religious service: Not on file    Active member of club or organization: Not on file    Attends meetings of clubs or organizations: Not on file    Relationship status: Not on file  Other Topics Concern  . Not on file  Social History Narrative   Social History      Diet? Low to no fiber diet; lactose (no tolerance), low or no soy      Do you drink/eat things with caffeine? No liquid caffeine, occasional chocolate      Marital status?                divorced                    What year were you married? n/a      Do you live in a house, apartment, assisted living, condo, trailer, etc.? Apartment/ retirement      Is it one or more stories? one      How many persons live in your home? one      Do you have any pets in your home? (please list) no      Highest  level of education completed? Master degree      Current or past profession: Audiologist/ Speech Therapist      Do you exercise?                 yes                     Type & how often? Water aerobics 3 x week      Advanced Directives      Do you have a living will? yes      Do you have a DNR form?        yes                         If not, do you want to discuss one?      Do you have signed POA/HPOA for forms? yes      Functional Status : completed by self      Do you have difficulty bathing or dressing yourself? no      Do you have difficulty preparing food or eating? no      Do you have difficulty managing your medications? No      Do you have difficulty managing your finances? no      Do you have difficulty affording your medications? no    reports that she has never smoked. She has never used smokeless tobacco. She reports current alcohol use of about 7.0 standard drinks of alcohol per week. She reports that she does not use drugs.  Functional Status Survey:    Family History  Problem Relation Age of Onset  . Uterine cancer  Sister   . COPD Maternal Grandfather   . Heart failure Mother 27  . Pneumonia Father 53  . Dementia Father   . Uterine cancer Maternal Grandmother   . Other Sister        Guillain Barre syndrom  . Kidney cancer Son   . Dementia Paternal Grandmother   . Colon cancer Neg Hx   . Esophageal cancer Neg Hx   . Liver cancer Neg Hx   . Pancreatic cancer Neg Hx   . Rectal cancer Neg Hx   . Stomach cancer Neg Hx     Health Maintenance  Topic Date Due  . INFLUENZA VACCINE  Completed  . DEXA SCAN  Completed  . TETANUS/TDAP  Discontinued  . PNA vac Low Risk Adult  Discontinued    No Known Allergies  Outpatient Encounter Medications as of 12/22/2018  Medication Sig  . acetaminophen (TYLENOL) 500 MG tablet Take 500 mg by mouth every 6 (six) hours as needed.  Marland Kitchen azithromycin (ZITHROMAX) 500 MG tablet Take 1 tablet (500 mg total) by mouth daily.    . bimatoprost (LUMIGAN) 0.01 % SOLN Place 1 drop into both eyes at bedtime.  . brimonidine (ALPHAGAN) 0.15 % ophthalmic solution Place 1 drop into both eyes 3 (three) times daily.  . cefpodoxime (VANTIN) 200 MG tablet Take 1 tablet (200 mg total) by mouth 2 (two) times daily.  . cetirizine (ZYRTEC) 10 MG tablet Take 10 mg by mouth at bedtime.   . diazepam (VALIUM) 5 MG tablet Take 1 by mouth 1 hour  pre-procedure with very light food. May bring 2nd tablet to appointment.  . eszopiclone (LUNESTA) 2 MG TABS tablet Take 1 tablet (2 mg total) by mouth at bedtime. Take immediately before bedtime  . gabapentin (NEURONTIN) 100 MG capsule Take two capsules by mouth once daily at bedtime (Patient taking differently: Take 200 mg by mouth at bedtime as needed. )  . guaiFENesin (MUCINEX) 600 MG 12 hr tablet Take 2 tablets (1,200 mg total) by mouth 2 (two) times daily.  Marland Kitchen loperamide (IMODIUM) 2 MG capsule Take 2 mg by mouth as needed for diarrhea or loose stools.   Marland Kitchen losartan (COZAAR) 50 MG tablet Take 1 tablet (50 mg total) by mouth daily.  . nitrofurantoin (MACRODANTIN) 50 MG capsule Take 1 capsule (50 mg total) by mouth daily.  . rosuvastatin (CRESTOR) 5 MG tablet Take 1 tablet (5 mg total) by mouth at bedtime. (Patient taking differently: Take 5 mg by mouth daily. )   Facility-Administered Encounter Medications as of 12/22/2018  Medication  . 0.9 %  sodium chloride infusion    Review of Systems  Constitutional: Negative for chills, fever and malaise/fatigue.  HENT: Negative for congestion.   Eyes:       Ocular htn  Respiratory: Negative for cough, sputum production, shortness of breath and wheezing.   Cardiovascular: Positive for leg swelling. Negative for chest pain and palpitations.  Gastrointestinal: Positive for abdominal pain, constipation and diarrhea. Negative for blood in stool, melena, nausea and vomiting.       Some RLQ discomfort comes and goes (?gi vs hip)  Genitourinary: Negative for  dysuria.       Retention, I/O caths, takes macrodantin prophylaxis from urology  Musculoskeletal: Positive for back pain and joint pain. Negative for falls.  Skin: Negative for itching and rash.  Neurological: Negative for dizziness and loss of consciousness.  Endo/Heme/Allergies: Does not bruise/bleed easily.  Psychiatric/Behavioral: Negative for depression and memory loss. The patient has  insomnia. The patient is not nervous/anxious.     Vitals:   12/22/18 0824  BP: (!) 152/70  Pulse: 83  Resp: 19  Temp: 97.7 F (36.5 C)  TempSrc: Oral  SpO2: 96%  Weight: 195 lb (88.5 kg)  Height: _0  (1.626 m)   Body mass index is 33.47 kg/m. Physical Exam Vitals signs and nursing note reviewed.  Constitutional:      General: She is not in acute distress.    Appearance: Normal appearance. She is obese. She is not ill-appearing or toxic-appearing.  HENT:     Head: Normocephalic and atraumatic.     Right Ear: External ear normal.     Left Ear: External ear normal.     Nose: Nose normal.     Mouth/Throat:     Mouth: Mucous membranes are moist.     Pharynx: Oropharynx is clear.  Eyes:     General: No scleral icterus.    Extraocular Movements: Extraocular movements intact.     Conjunctiva/sclera: Conjunctivae normal.     Pupils: Pupils are equal, round, and reactive to light.     Comments: glasses  Neck:     Musculoskeletal: Neck supple.  Cardiovascular:     Rate and Rhythm: Normal rate and regular rhythm.     Pulses: Normal pulses.     Heart sounds: Normal heart sounds.  Pulmonary:     Effort: Pulmonary effort is normal. No respiratory distress.     Breath sounds: Normal breath sounds. No wheezing, rhonchi or rales.  Abdominal:     General: Bowel sounds are normal. There is no distension.     Palpations: Abdomen is soft. There is no mass.     Tenderness: There is abdominal tenderness. There is no right CVA tenderness, left CVA tenderness, guarding or rebound.     Hernia: No  hernia is present.     Comments: Slight RLQ discomfort  Musculoskeletal: Normal range of motion.        General: No swelling.     Right lower leg: Edema present.     Left lower leg: Edema present.     Comments: Tender over SI/buttock only  Lymphadenopathy:     Cervical: No cervical adenopathy.  Skin:    General: Skin is warm and dry.     Capillary Refill: Capillary refill takes less than 2 seconds.  Neurological:     General: No focal deficit present.     Mental Status: She is alert and oriented to person, place, and time.     Cranial Nerves: No cranial nerve deficit.     Sensory: No sensory deficit.     Motor: No weakness.     Coordination: Coordination normal.     Gait: Gait normal.     Deep Tendon Reflexes: Reflexes normal.  Psychiatric:        Mood and Affect: Mood normal.        Behavior: Behavior normal.        Thought Content: Thought content normal.        Judgment: Judgment normal.     Labs reviewed: Basic Metabolic Panel: Recent Labs    12/14/18 0316 12/15/18 0357 12/19/18 1700  NA 142 139 138  K 4.4 3.5 3.4*  CL 108 106 103  CO2 _1 GLUCOSE 102* 93 100*  BUN _2 CREATININE 0.78 0.64 0.58  CALCIUM 8.8* 8.2* 9.0   Liver Function Tests: Recent Labs    08/24/18 1111 12/14/18 0316  12/19/18 1700  AST 20 29 42*  ALT _0 ALKPHOS 121* 83 118  BILITOT 0.4 1.1 0.2*  PROT 6.7 6.0* 6.7  ALBUMIN 4.2 3.4* 3.5   Recent Labs    12/14/18 0316 12/19/18 1700  LIPASE 19 25   No results for input(s): AMMONIA in the last 8760 hours. CBC: Recent Labs    12/14/18 0316 12/15/18 0357 12/19/18 1700  WBC 5.6 6.3 5.8  NEUTROABS 4.5 4.4 4.0  HGB 11.3* 10.1* 11.4*  HCT 38.0 32.6* 37.3  MCV 98.7 96.7 96.6  PLT 168 148* 229   Cardiac Enzymes: No results for input(s): CKTOTAL, CKMB, CKMBINDEX, TROPONINI in the last 8760 hours. BNP: Invalid input(s): POCBNP No results found for: HGBA1C No results found for: TSH No results found for:  VITAMINB12 No results found for: FOLATE No results found for: IRON, TIBC, FERRITIN  Imaging and Procedures obtained prior to SNF admission: Ct Abdomen Pelvis W Contrast  Result Date: 12/19/2018 CLINICAL DATA:  Bowel obstruction, acute abdominal pain EXAM: CT ABDOMEN AND PELVIS WITH CONTRAST TECHNIQUE: Multidetector CT imaging of the abdomen and pelvis was performed using the standard protocol following bolus administration of intravenous contrast. Sagittal and coronal MPR images reconstructed from axial data set. CONTRAST:  121m ISOVUE-300 IOPAMIDOL (ISOVUE-300) INJECTION 61% IV. No oral contrast. COMPARISON:  None FINDINGS: Lower chest: Small bibasilar pleural effusions and adjacent compressive atelectasis of the lower lobes. Hepatobiliary: Probable focal fatty infiltration of liver adjacent to falciform fissure. Tiny calcified hepatic granulomata. Gallbladder and liver otherwise normal appearance. Pancreas: Normal appearance Spleen: Tiny calcified granulomata.  Otherwise normal appearance. Adrenals/Urinary Tract: Adrenal glands normal appearance. Extrarenal pelvis RIGHT kidney. Kidneys, ureters, and bladder otherwise normal appearance. Stomach/Bowel: Mildly prominent stool throughout colon. Stomach decompressed with question small hiatal hernia. Bowel anastomosis in the LEFT mid abdomen. No definite evidence of bowel obstruction or bowel dilatation. No bowel wall thickening. Infiltrative changes of presacral and perirectal fat, nonspecific, without associated rectal wall changes. Remaining bowel loops unremarkable. Vascular/Lymphatic: Atherosclerotic calcifications aorta and iliac arteries. Aorta normal caliber. Normal sized celiac axis node 8 mm diameter image 20. No abdominal or pelvic adenopathy. Reproductive: Uterus surgically absent. Nonvisualization of ovaries. Other: No free air or free fluid.  No hernia. Musculoskeletal: Bones demineralized. Degenerative disc and facet disease changes lumbar spine  with minimal anterolisthesis at L4-L5. Mixed lytic and sclerotic process involving the RIGHT acetabulum associated with a nondisplaced acetabular fracture, question pathologic fracture through an underlying bone lesion or metastatic focus or sequela of subacute trauma/fracture. IMPRESSION: Mild perirectal and presacral edema without associated rectal wall thickening or definite inflammatory changes; these findings are nonspecific, could be related to subtle proctitis or other causes. Mildly increased stool throughout colon. Mixed lytic and sclerotic appearance of the RIGHT acetabulum with associated nondisplaced fracture, question pathologic fracture through an underlying bone lesion or metastatic focus versus sequela of a subacute fracture. Electronically Signed   By: MLavonia DanaM.D.   On: 12/19/2018 18:50    Assessment/Plan 1. Community acquired pneumonia of left lung, unspecified part of lung -clinically resolved -for f/u CXR in 4 wks  2. Pathological fracture of right acetabulum, initial encounter -abnormality noted on CT abd/pelvis incidentally -has seen Dr. YLorin Mercyand now will see Dr. GAlvy Bimlerfor oncology workup -had two prior cancers--vaginal vault s/p XRT 22 yrs ago and MALT lymphoma s/p medication regimen 2017-2019 reportedly "cured" since February 2019 -has had bowel difficulties--diarrhea alternating with constipation--initially after her XRT, had some colitis, but later developed bowel  obstruction from fiber; now having more challenges and some RLQ pain--to see GI about this part, but it was concerning to me b/c there has been a change  3. 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 Trochanteric bursitis, right hip -s/p injection x 2 with some initial benefit -? Had fx longer or all from her back to begin with  5. H/O Cancer of vaginal vault ([facility]) -s/p XRT 22 years ago -complicated by urinary incontinence with bladder  tack and subsequent retention issues--does I/o caths  6. Spinal stenosis of lumbar region without neurogenic claudication -ongoing difficulty here, following with Dr. Lorin Mercy  7. MALT lymphoma ([facility]) -s/p treatment--appears endoscopy with biopsies last was in June rather than feb and were negative for any malt lymphoma so no f/u EGD was needed (05/24/18); 10/27/17, path from [facility] had shown minimal lymphocytic infiltrates   8. Incomplete emptying of bladder -continue I/O caths and macrodantin prophylaxis (to restart when completes full course of pneumonia abx)  9. Osteopenia of both hips -unclear if this was truly osteoporosis b/c she does not seem to believe the report was correct and I don't have a copy of the latest bone density -now has pathologic fracture pending workup on right acetabulum  Family/ staff Communication: met with pt and her son, [name], for one hour this morning and answered all questions as best I could with the current about of information  Labs/tests ordered:  Cbc, bmp in am; f/u CXR in 4 wks; oncology consult; f/u with GI  Seth Friedlander L. Jamani Eley, D.O. [facility] [address], [place] [address] Cell Phone (Mon-Fri 8am-5pm):  [phone] On Call:  [phone] & follow prompts after 5pm & weekends Office Phone:  [phone] Office Fax:  [phone]

## 2018-12-23 DIAGNOSIS — D649 Anemia, unspecified: Secondary | ICD-10-CM | POA: Diagnosis not present

## 2018-12-23 DIAGNOSIS — J189 Pneumonia, unspecified organism: Secondary | ICD-10-CM | POA: Diagnosis not present

## 2018-12-24 DIAGNOSIS — S3289XS Fracture of other parts of pelvis, sequela: Secondary | ICD-10-CM | POA: Diagnosis not present

## 2018-12-24 DIAGNOSIS — R2689 Other abnormalities of gait and mobility: Secondary | ICD-10-CM | POA: Diagnosis not present

## 2018-12-24 DIAGNOSIS — R278 Other lack of coordination: Secondary | ICD-10-CM | POA: Diagnosis not present

## 2018-12-24 DIAGNOSIS — M48061 Spinal stenosis, lumbar region without neurogenic claudication: Secondary | ICD-10-CM | POA: Diagnosis not present

## 2018-12-24 DIAGNOSIS — C52 Malignant neoplasm of vagina: Secondary | ICD-10-CM | POA: Diagnosis not present

## 2018-12-24 DIAGNOSIS — J189 Pneumonia, unspecified organism: Secondary | ICD-10-CM | POA: Diagnosis not present

## 2018-12-27 ENCOUNTER — Ambulatory Visit: Payer: Medicare Other | Admitting: Hematology

## 2018-12-27 DIAGNOSIS — C52 Malignant neoplasm of vagina: Secondary | ICD-10-CM | POA: Diagnosis not present

## 2018-12-27 DIAGNOSIS — J189 Pneumonia, unspecified organism: Secondary | ICD-10-CM | POA: Diagnosis not present

## 2018-12-27 DIAGNOSIS — S3289XS Fracture of other parts of pelvis, sequela: Secondary | ICD-10-CM | POA: Diagnosis not present

## 2018-12-27 DIAGNOSIS — R2689 Other abnormalities of gait and mobility: Secondary | ICD-10-CM | POA: Diagnosis not present

## 2018-12-27 DIAGNOSIS — R278 Other lack of coordination: Secondary | ICD-10-CM | POA: Diagnosis not present

## 2018-12-27 DIAGNOSIS — M48061 Spinal stenosis, lumbar region without neurogenic claudication: Secondary | ICD-10-CM | POA: Diagnosis not present

## 2018-12-31 ENCOUNTER — Telehealth: Payer: Self-pay | Admitting: Hematology and Oncology

## 2018-12-31 ENCOUNTER — Encounter: Payer: Self-pay | Admitting: Hematology and Oncology

## 2018-12-31 ENCOUNTER — Telehealth: Payer: Self-pay

## 2018-12-31 ENCOUNTER — Inpatient Hospital Stay: Payer: Medicare Other | Attending: Hematology | Admitting: Hematology and Oncology

## 2018-12-31 VITALS — BP 144/63 | HR 65 | Temp 97.9°F | Resp 18

## 2018-12-31 DIAGNOSIS — C884 Extranodal marginal zone B-cell lymphoma of mucosa-associated lymphoid tissue [MALT-lymphoma]: Secondary | ICD-10-CM | POA: Insufficient documentation

## 2018-12-31 DIAGNOSIS — G8929 Other chronic pain: Secondary | ICD-10-CM

## 2018-12-31 DIAGNOSIS — Z8542 Personal history of malignant neoplasm of other parts of uterus: Secondary | ICD-10-CM | POA: Insufficient documentation

## 2018-12-31 DIAGNOSIS — C52 Malignant neoplasm of vagina: Secondary | ICD-10-CM

## 2018-12-31 DIAGNOSIS — Z79899 Other long term (current) drug therapy: Secondary | ICD-10-CM | POA: Insufficient documentation

## 2018-12-31 DIAGNOSIS — K5909 Other constipation: Secondary | ICD-10-CM | POA: Insufficient documentation

## 2018-12-31 DIAGNOSIS — I10 Essential (primary) hypertension: Secondary | ICD-10-CM | POA: Insufficient documentation

## 2018-12-31 DIAGNOSIS — M549 Dorsalgia, unspecified: Secondary | ICD-10-CM | POA: Diagnosis not present

## 2018-12-31 DIAGNOSIS — K56609 Unspecified intestinal obstruction, unspecified as to partial versus complete obstruction: Secondary | ICD-10-CM | POA: Diagnosis not present

## 2018-12-31 DIAGNOSIS — R5381 Other malaise: Secondary | ICD-10-CM

## 2018-12-31 NOTE — Assessment & Plan Note (Addendum)
The patient has inadequate follow-up for lymphoma. She has abnormal lesion seen in her bone, worrisome for cancer I recommend PET CT scan for further evaluation and staging and she agreed with the plan of care Due to her history of multiple cancer and positive family history of cancer, I recommend genetic counselor referral and she agreed

## 2018-12-31 NOTE — Assessment & Plan Note (Signed)
She was treated with adjuvant radiation therapy over 20 years ago.  She has no further GYN oncology follow-up. We will proceed with staging PET scan for lymphoma and we will look at her anatomy further.

## 2018-12-31 NOTE — Telephone Encounter (Signed)
Minnie with [facility] called to clarify Ms. Amoroso is supposed to stop Imodium.  Called back. Per Dr. Alvy Bimler, she told patient to stop Imodium. Minnie verbalized understanding.

## 2018-12-31 NOTE — Progress Notes (Signed)
[place] NOTE  Patient Care Team: Gayland Curry, DO as PCP - General (Geriatric Medicine) Rolm Bookbinder, MD as Consulting Physician (Dermatology) Marybelle Killings, MD as Consulting Physician (Orthopedic Surgery) Jola Schmidt, MD as Consulting Physician (Ophthalmology) Milus Banister, MD as Attending Physician (Gastroenterology) Regal, Tamala Fothergill, DPM as Consulting Physician (Podiatry) Lucas Mallow, MD as Consulting Physician (Urology) Jodi Marble, MD as Consulting Physician (Otolaryngology)  ASSESSMENT & PLAN:  MALT lymphoma [facility]) The patient has inadequate follow-up for lymphoma. 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  She has no further GYN oncology follow-up. We will proceed with staging PET scan for lymphoma and we will look at her anatomy further.  Chronic constipation with overflow incontinence Her abdominal symptoms could be due to chronic constipation leading to overflow incontinence The patient had multiple surgeries, history of bowel obstruction and scarring noted on recent imaging study that could lead to incomplete evacuation. Recent CT imaging did not disclose some signs of constipation I recommend discontinuation of Imodium and to institute low-dose laxative therapy on a regular basis, with goal to get bowel movement at least once every other day.  Physical deconditioning She has significant physical deconditioning due to recurrent admission to the hospital with infection, recent GI issue, chronic back pain and others On review of her imaging study, I do not believe she has signs of impeding  hip fracture I recommend gradual exercise as tolerated.  I recommend rehab   Orders Placed This Encounter  Procedures  . NM PET Image Initial (PI) Skull Base To Thigh    Standing Status:   Future    Standing Expiration Date:   12/31/2019    Order Specific Question:   If indicated for the ordered procedure, I authorize the administration of a radiopharmaceutical per Radiology protocol    Answer:   Yes    Order Specific Question:   Preferred imaging location?    Answer:   [facility]    Order Specific Question:   Radiology Contrast Protocol - do NOT remove file path    Answer:   [email or web address]  . Ambulatory referral to Genetics    Referral Priority:   Routine    Referral Type:   Consultation    Referral Reason:   Specialty Services Required    Number of Visits Requested:   1     CHIEF COMPLAINTS/PURPOSE OF CONSULTATION:  History of uterine cancer and lymphoma, abnormal bone lesion noted on recent CT imaging, worrisome for cancer relapse  HISTORY OF PRESENTING ILLNESS:  Linda [name] 85 y.o. female is here because of abnormal CT finding. She is accompanied by her son, Linda [name] today. Her patient is retired Chief Technology Officer.  She has 3 sons. She relocated to [place] from [place] to be closer to her son. The patient had significant past history of cancer. She is an excellent historian.  The patient have history of uterine fibroids leading to excessive bleeding.  Prior to complete hysterectomy, she had right salpingo-oophorectomy when she was younger due to ectopic pregnancy. Approximately 22 years ago, she was diagnosed with abnormal cancer noted on  the vaginal vault.  We do not have any records related to this but according to the patient, she was diagnosed with early stage uterine cancer. She described that at approximately 2.5 cm without lymph node involvement. She was offered adjuvant radiation therapy after resection led to  significant damage to her bladder and colon. With bladder damage, she has significant incontinence leading to further surgery to her bladder and the need for intermittent self-catheterization over the last 15 to 18 years.  She takes chronic nitrofurantoin to prevent recurrent urinary tract infection.  Her radiation therapy and surgery has led to bowel obstruction leading to segmental bowel resection.  Since then, she had irregular bowel habits.  In 2017, after travel, she developed significant dehydration, nausea, vomiting and hemoptysis.  She was hospitalized and underwent interventional cauterization of bleeding vessel and upper endoscopy.  In her EGD report, it was noted that she had abnormal ulcerated lesion in her stomach.  Biopsy showed evidence of malignancy, most consistent with MALT lymphoma.  She was offered triple therapy and was referred to see radiation oncologist to consider radiation therapy.  However, the patient declined radiation therapy.  She has at least 4 EGD performed since then and the last one that was done in 2019 was negative for recurrence.  She has not been staged with PET CT scan or follow-up with periodic imaging studies since her diagnosis.  Last year, she developed significant lower back pain and right hip bursitis limiting her physical activity.  She was evaluated extensively by orthopedic surgery.  She developed pneumonia and was hospitalized end of last year  Subsequently, she developed significant changes in bowel habits with constipation for 3 to 4 days leading to forceful significant diarrhea.  She will take Imodium whenever that happens, and then developed constipation again.  She average about twice a week with this similar episodes.  She developed severe lower abdominal pain recently leading to CT imaging of her abdomen which revealed abnormality seen in the right acetabulum region, along with inflammatory change near the rectal area. She denies right hip  pain.  I have reviewed her chart and materials related to her cancer extensively and collaborated history with the patient. Summary of oncologic history is as follows:   MALT lymphoma ([place])   05/09/2016 Pathology Results    Stomach Ulcer: Biopsy in [place] - Small B-cell lymphoma, most consistent with extranodal marginal zone lymphoma     12/19/2018 Imaging    Ct abdomen and pelvis Mild perirectal and presacral edema without associated rectal wall thickening or definite inflammatory changes; these findings are nonspecific, could be related to subtle proctitis or other causes.  Mildly increased stool throughout colon.  Mixed lytic and sclerotic appearance of the RIGHT acetabulum with associated nondisplaced fracture, question pathologic fracture through an underlying bone lesion or metastatic focus versus sequela of a subacute fracture.    12/30/2018 Cancer Staging    Staging form: Hodgkin and Non-Hodgkin Lymphoma, AJCC 8th Edition - Clinical stage from 12/30/2018: Stage IE - Signed by Heath Lark, MD on 12/30/2018    The patient has significant deconditioning.  She was not allowed to move without assistance.  She was recommended to have weightbearing exercise on her right hip. Her appetite is fair.  She denies recent weight changes.  No recent cough, chest pain or shortness of breath.  No other lymphadenopathy elsewhere.  MEDICAL HISTORY:  Past Medical History:  Diagnosis Date  . Allergy   . Anxiety   . Arthritis   .  Bursitis    right leg  . GERD (gastroesophageal reflux disease)   . History of blood transfusion   . Hyperlipidemia   . Hypertension   . Insomnia   . Non Hodgkin's lymphoma ([place]) 2017-2019   cured  . Osteopenia    MILD  . Urine retention   . Uterine cancer ([place])    at age 53    SURGICAL HISTORY: Past Surgical History:  Procedure Laterality Date  . ABDOMINAL HYSTERECTOMY     AGE 28  . APPENDECTOMY     AGE 16  . BLADDER SURGERY    . Catarct Surgery     . ECTOPIC PREGNANCY SURGERY     age 46  . LAPAROSCOPIC SMALL BOWEL RESECTION    . NM PET DX LYMPHOMA  10/2018   IN REMISSION  . occular pressure    . OTHER SURGICAL HISTORY    . REPLACEMENT TOTAL KNEE BILATERAL    . TONSILLECTOMY AND ADENOIDECTOMY     age 72 or 49  . UTERINE CANCER SURGERY     age 42- located in vagina    SOCIAL HISTORY: Social History   Socioeconomic History  . Marital status: Single    Spouse name: Not on file  . Number of children: Not on file  . Years of education: Not on file  . Highest education level: Not on file  Occupational History  . Occupation: retired  Scientific laboratory technician  . Financial resource strain: Not on file  . Food insecurity:    Worry: Not on file    Inability: Not on file  . Transportation needs:    Medical: Not on file    Non-medical: Not on file  Tobacco Use  . Smoking status: Never Smoker  . Smokeless tobacco: Never Used  Substance and Sexual Activity  . Alcohol use: Yes    Alcohol/week: 7.0 standard drinks    Types: 7 Glasses of wine per week    Comment: one glass of wine with dinner  . Drug use: No  . Sexual activity: Never  Lifestyle  . Physical activity:    Days per week: Not on file    Minutes per session: Not on file  . Stress: Not on file  Relationships  . Social connections:    Talks on phone: Not on file    Gets together: Not on file    Attends religious service: Not on file    Active member of club or organization: Not on file    Attends meetings of clubs or organizations: Not on file    Relationship status: Not on file  . Intimate partner violence:    Fear of current or ex partner: Not on file    Emotionally abused: Not on file    Physically abused: Not on file    Forced sexual activity: Not on file  Other Topics Concern  . Not on file  Social History Narrative   Social History      Diet? Low to no fiber diet; lactose (no tolerance), low or no soy      Do you drink/eat things with caffeine? No liquid  caffeine, occasional chocolate      Marital status?                divorced                    What year were you married? n/a      Do you live in a house, apartment,  assisted living, condo, trailer, etc.? Apartment/ retirement      Is it one or more stories? one      How many persons live in your home? one      Do you have any pets in your home? (please list) no      Highest level of education completed? Master degree      Current or past profession: Audiologist/ Speech Therapist      Do you exercise?                 yes                     Type & how often? Water aerobics 3 x week      Advanced Directives      Do you have a living will? yes      Do you have a DNR form?        yes                         If not, do you want to discuss one?      Do you have signed POA/HPOA for forms? yes      Functional Status : completed by self      Do you have difficulty bathing or dressing yourself? no      Do you have difficulty preparing food or eating? no      Do you have difficulty managing your medications? No      Do you have difficulty managing your finances? no      Do you have difficulty affording your medications? no    FAMILY HISTORY: Family History  Problem Relation Age of Onset  . Uterine cancer Sister 58       uterine cancer  . COPD Maternal Grandfather   . Heart failure Mother 50  . Pneumonia Father 88  . Dementia Father   . Uterine cancer Maternal Grandmother 62  . Other Sister        Guillain Barre syndrom  . Kidney cancer Son   . Dementia Paternal Grandmother   . Colon cancer Neg Hx   . Esophageal cancer Neg Hx   . Liver cancer Neg Hx   . Pancreatic cancer Neg Hx   . Rectal cancer Neg Hx   . Stomach cancer Neg Hx     ALLERGIES:  has No Known Allergies.  MEDICATIONS:  Current Outpatient Medications  Medication Sig Dispense Refill  . acetaminophen (TYLENOL) 500 MG tablet Take 500 mg by mouth every 6 (six) hours as needed.    . bimatoprost  (LUMIGAN) 0.01 % SOLN Place 1 drop into both eyes at bedtime.    . brimonidine (ALPHAGAN) 0.15 % ophthalmic solution Place 1 drop into both eyes 3 (three) times daily.    . cetirizine (ZYRTEC) 10 MG tablet Take 10 mg by mouth at bedtime.     . eszopiclone (LUNESTA) 2 MG TABS tablet Take 1 tablet (2 mg total) by mouth at bedtime. Take immediately before bedtime 90 tablet 3  . losartan (COZAAR) 50 MG tablet Take 1 tablet (50 mg total) by mouth daily. 90 tablet 3  . nitrofurantoin (MACRODANTIN) 50 MG capsule Take 1 capsule (50 mg total) by mouth daily.    Marland Kitchen Propylene Glycol (SYSTANE BALANCE) 0.6 % SOLN Apply 1 drop to eye daily as needed.     Current Facility-Administered Medications  Medication Dose Route Frequency Provider Last Rate  Last Dose  . 0.9 %  sodium chloride infusion  500 mL Intravenous Once Milus Banister, MD        REVIEW OF SYSTEMS:   Constitutional: Denies fevers, chills or abnormal night sweats Eyes: Denies blurriness of vision, double vision or watery eyes Ears, nose, mouth, throat, and face: Denies mucositis or sore throat Respiratory: Denies cough, dyspnea or wheezes Cardiovascular: Denies palpitation, chest discomfort or lower extremity swelling Skin: Denies abnormal skin rashes Lymphatics: Denies new lymphadenopathy or easy bruising Neurological:Denies numbness, tingling or new weaknesses Behavioral/Psych: Mood is stable, no new changes  All other systems were reviewed with the patient and are negative.  PHYSICAL EXAMINATION: ECOG PERFORMANCE STATUS: 2 - Symptomatic, <50% confined to bed  Vitals:   12/31/18 1109  BP: (!) 144/63  Pulse: 65  Resp: 18  Temp: 97.9 F (36.6 C)  SpO2: 96%   There were no vitals filed for this visit.  GENERAL:alert, no distress and comfortable SKIN: skin color, texture, turgor are normal, no rashes or significant lesions EYES: normal, conjunctiva are pink and non-injected, sclera clear OROPHARYNX:no exudate, no erythema and  lips, buccal mucosa, and tongue normal  NECK: supple, thyroid normal size, non-tender, without nodularity LYMPH:  no palpable lymphadenopathy in the cervical, axillary or inguinal LUNGS: clear to auscultation and percussion with normal breathing effort HEART: regular rate & rhythm and no murmurs and no lower extremity edema ABDOMEN:abdomen soft, non-tender and normal bowel sounds Musculoskeletal:no cyanosis of digits and no clubbing  PSYCH: alert & oriented x 3 with fluent speech NEURO: no focal motor/sensory deficits  LABORATORY DATA:  I have reviewed the data as listed Lab Results  Component Value Date   WBC 5.8 12/19/2018   HGB 11.4 (L) 12/19/2018   HCT 37.3 12/19/2018   MCV 96.6 12/19/2018   PLT 229 12/19/2018   Recent Labs    08/24/18 1111 12/14/18 0316 12/15/18 0357 12/19/18 1700  NA 142 142 139 138  K 4.3 4.4 3.5 3.4*  CL 104 108 106 103  CO2 31 26 25 27   GLUCOSE 80 102* 93 100*  BUN 17 12 8 11   CREATININE 0.68 0.78 0.64 0.58  CALCIUM 9.4 8.8* 8.2* 9.0  GFRNONAA  --  >60 >60 >60  GFRAA  --  >60 >60 >60  PROT 6.7 6.0*  --  6.7  ALBUMIN 4.2 3.4*  --  3.5  AST 20 29  --  42*  ALT 14 17  --  26  ALKPHOS 121* 83  --  118  BILITOT 0.4 1.1  --  0.2*    RADIOGRAPHIC STUDIES: I have personally reviewed the radiological images as listed and agreed with the findings in the report. Dg Chest 2 View  Result Date: 12/14/2018 CLINICAL DATA:  Acute onset of cough and shortness of breath. EXAM: CHEST - 2 VIEW COMPARISON:  None. FINDINGS: The lungs are well-aerated. Left perihilar airspace opacification raises concern for pneumonia. There is no evidence of pleural effusion or pneumothorax. The heart is normal in size; the mediastinal contour is within normal limits. No acute osseous abnormalities are seen. IMPRESSION: Left perihilar airspace opacification raises concern for pneumonia. Electronically Signed   By: Garald Balding M.D.   On: 12/14/2018 03:33   Ct Abdomen Pelvis W  Contrast  Result Date: 12/19/2018 CLINICAL DATA:  Bowel obstruction, acute abdominal pain EXAM: CT ABDOMEN AND PELVIS WITH CONTRAST TECHNIQUE: Multidetector CT imaging of the abdomen and pelvis was performed using the standard protocol following bolus administration of  intravenous contrast. Sagittal and coronal MPR images reconstructed from axial data set. CONTRAST:  187mL ISOVUE-300 IOPAMIDOL (ISOVUE-300) INJECTION 61% IV. No oral contrast. COMPARISON:  None FINDINGS: Lower chest: Small bibasilar pleural effusions and adjacent compressive atelectasis of the lower lobes. Hepatobiliary: Probable focal fatty infiltration of liver adjacent to falciform fissure. Tiny calcified hepatic granulomata. Gallbladder and liver otherwise normal appearance. Pancreas: Normal appearance Spleen: Tiny calcified granulomata.  Otherwise normal appearance. Adrenals/Urinary Tract: Adrenal glands normal appearance. Extrarenal pelvis RIGHT kidney. Kidneys, ureters, and bladder otherwise normal appearance. Stomach/Bowel: Mildly prominent stool throughout colon. Stomach decompressed with question small hiatal hernia. Bowel anastomosis in the LEFT mid abdomen. No definite evidence of bowel obstruction or bowel dilatation. No bowel wall thickening. Infiltrative changes of presacral and perirectal fat, nonspecific, without associated rectal wall changes. Remaining bowel loops unremarkable. Vascular/Lymphatic: Atherosclerotic calcifications aorta and iliac arteries. Aorta normal caliber. Normal sized celiac axis node 8 mm diameter image 20. No abdominal or pelvic adenopathy. Reproductive: Uterus surgically absent. Nonvisualization of ovaries. Other: No free air or free fluid.  No hernia. Musculoskeletal: Bones demineralized. Degenerative disc and facet disease changes lumbar spine with minimal anterolisthesis at L4-L5. Mixed lytic and sclerotic process involving the RIGHT acetabulum associated with a nondisplaced acetabular fracture, question  pathologic fracture through an underlying bone lesion or metastatic focus or sequela of subacute trauma/fracture. IMPRESSION: Mild perirectal and presacral edema without associated rectal wall thickening or definite inflammatory changes; these findings are nonspecific, could be related to subtle proctitis or other causes. Mildly increased stool throughout colon. Mixed lytic and sclerotic appearance of the RIGHT acetabulum with associated nondisplaced fracture, question pathologic fracture through an underlying bone lesion or metastatic focus versus sequela of a subacute fracture. Electronically Signed   By: Lavonia Dana M.D.   On: 12/19/2018 18:50    I spent 60 minutes counseling the patient face to face. The total time spent in the appointment was 80 minutes and more than 50% was on counseling.  All questions were answered. The patient knows to call the clinic with any problems, questions or concerns.  Heath Lark, MD 12/31/2018 6:25 PM

## 2018-12-31 NOTE — Assessment & Plan Note (Signed)
Her abdominal symptoms could be due to chronic constipation leading to overflow incontinence The patient had multiple surgeries, history of bowel obstruction and scarring noted on recent imaging study that could lead to incomplete evacuation. Recent CT imaging did not disclose some signs of constipation I recommend discontinuation of Imodium and to institute low-dose laxative therapy on a regular basis, with goal to get bowel movement at least once every other day.

## 2018-12-31 NOTE — Assessment & Plan Note (Signed)
She has significant physical deconditioning due to recurrent admission to the hospital with infection, recent GI issue, chronic back pain and others On review of her imaging study, I do not believe she has signs of impeding hip fracture I recommend gradual exercise as tolerated.  I recommend rehab

## 2018-12-31 NOTE — Telephone Encounter (Signed)
Gave avs and calendar ° °

## 2019-01-03 ENCOUNTER — Telehealth: Payer: Self-pay

## 2019-01-03 NOTE — Telephone Encounter (Signed)
Rob, PT at [facility] called and left a message. Clarifying weight bearing status, per note Dr. Alvy Bimler said gradual exercises as tolerated. Dr. Lorin Mercy on 1/8 ordered partial weight bearing status.

## 2019-01-03 NOTE — Telephone Encounter (Signed)
Called and left message asking Rob to call the office.

## 2019-01-03 NOTE — Telephone Encounter (Signed)
Renea Ee, PT at [facility]. Given below message. He verbalized understanding.

## 2019-01-03 NOTE — Telephone Encounter (Signed)
Yes, I am giving PT permission to work with patient gradual exercise as tolerated

## 2019-01-04 ENCOUNTER — Non-Acute Institutional Stay (SKILLED_NURSING_FACILITY): Payer: Medicare Other | Admitting: Internal Medicine

## 2019-01-04 ENCOUNTER — Encounter: Payer: Self-pay | Admitting: Internal Medicine

## 2019-01-04 DIAGNOSIS — K5909 Other constipation: Secondary | ICD-10-CM | POA: Diagnosis not present

## 2019-01-04 DIAGNOSIS — R339 Retention of urine, unspecified: Secondary | ICD-10-CM

## 2019-01-04 DIAGNOSIS — R278 Other lack of coordination: Secondary | ICD-10-CM | POA: Diagnosis not present

## 2019-01-04 DIAGNOSIS — M48061 Spinal stenosis, lumbar region without neurogenic claudication: Secondary | ICD-10-CM

## 2019-01-04 DIAGNOSIS — C884 Extranodal marginal zone B-cell lymphoma of mucosa-associated lymphoid tissue [MALT-lymphoma]: Secondary | ICD-10-CM

## 2019-01-04 DIAGNOSIS — C52 Malignant neoplasm of vagina: Secondary | ICD-10-CM | POA: Diagnosis not present

## 2019-01-04 DIAGNOSIS — S3289XS Fracture of other parts of pelvis, sequela: Secondary | ICD-10-CM | POA: Diagnosis not present

## 2019-01-04 DIAGNOSIS — J189 Pneumonia, unspecified organism: Secondary | ICD-10-CM | POA: Diagnosis not present

## 2019-01-04 DIAGNOSIS — R2689 Other abnormalities of gait and mobility: Secondary | ICD-10-CM | POA: Diagnosis not present

## 2019-01-04 NOTE — Progress Notes (Signed)
Location:  Occupational psychologist of Service:  SNF (31)(Rehab) Provider:  Mehar Kirkwood L. Mariea Clonts, D.O., C.M.D.  Linda Curry, DO  Patient Care Team: Linda Curry, DO as PCP - General (Geriatric Medicine) Rolm Bookbinder, MD as Consulting Physician (Dermatology) Linda Killings, MD as Consulting Physician (Orthopedic Surgery) Linda Schmidt, MD as Consulting Physician (Ophthalmology) Linda Banister, MD as Attending Physician (Gastroenterology) Linda [name], DPM as Consulting Physician (Podiatry) Linda Mallow, MD as Consulting Physician (Urology) Linda Marble, MD as Consulting Physician (Otolaryngology)  Extended Emergency Contact Information Primary Emergency Contact: Linda [name] Address: [address]          [place], [place] [address] Linda [name] of [place] Phone: [phone] Relation: Son Secondary Emergency Contact: Linda [name] Address: [address] [place] [address] Linda [name] of [place] Mobile Phone: [phone] Relation: Son  Goals of care: Advanced Directive information Advanced Directives 12/22/2018  Does Patient Have a Medical Advance Directive? Yes  Type of Advance Directive [place]  Does patient want to make changes to medical advance directive? No - Patient declined  Copy of [place] in Chart? Yes - validated most recent copy scanned in chart (See row information)     Chief Complaint  Patient presents with  . Follow-up    after oncology appt    HPI:  Pt is a 85 y.o. female seen today for follow-up after oncology appointment.  I stopped by to check on [name] after she'd seen oncology.  She reports an excellent review of her cancer history was conducted (vaginal cuff ca and recent MALT lymphoma).  She now has a PET scan on Monday and a follow-up appointment afterward on Tuesday.  She needed an anxiety medication to help her relax for the MRI she had  of her back and asks if she can have medicine again before the PET scan.  She also has an upcoming meeting with genetic counseling due to her two different cancers she's had and family history of BRCA mutation.  She is off imodium and on miralax for overflow diarrhea/constipation.  She still, unfortunately, had an episode of incontinence just the other day when she was walking outside of the unit.  She is now staying on the unit and only walking with the walker in her room, using the wheelchair outside of her room.  She's afraid she will have another incontinence episode.  She has zero pain.  She is a bit bored staying in rehab but understands it's for her safety so she does not overdo at home and hurt herself and to ensure everything gets followed up adequately.  She's been reading kindle books and hardback books.  She reports her son is very supportive.     Past Medical History:  Diagnosis Date  . Allergy   . Anxiety   . Arthritis   . Bursitis    right leg  . GERD (gastroesophageal reflux disease)   . History of blood transfusion   . Hyperlipidemia   . Hypertension   . Insomnia   . Non Hodgkin's lymphoma ([place]) 2017-2019   cured  . Osteopenia    MILD  . Urine retention   . Uterine cancer ([place])    at age 21   Past Surgical History:  Procedure Laterality Date  . ABDOMINAL HYSTERECTOMY     AGE 54  . APPENDECTOMY     AGE  21  . BLADDER SURGERY    . Catarct Surgery    . ECTOPIC PREGNANCY SURGERY     age 38  . LAPAROSCOPIC SMALL BOWEL RESECTION    . NM PET DX LYMPHOMA  10/2018   IN REMISSION  . occular pressure    . OTHER SURGICAL HISTORY    . REPLACEMENT TOTAL KNEE BILATERAL    . TONSILLECTOMY AND ADENOIDECTOMY     age 34 or 62  . UTERINE CANCER SURGERY     age 73- located in vagina    No Known Allergies  Outpatient Encounter Medications as of 01/04/2019  Medication Sig  . acetaminophen (TYLENOL) 500 MG tablet Take 500 mg by mouth every 6 (six) hours as needed.  .  bimatoprost (LUMIGAN) 0.01 % SOLN Place 1 drop into both eyes at bedtime.  . brimonidine (ALPHAGAN) 0.15 % ophthalmic solution Place 1 drop into both eyes 3 (three) times daily.  . cetirizine (ZYRTEC) 10 MG tablet Take 10 mg by mouth at bedtime.   . eszopiclone (LUNESTA) 2 MG TABS tablet Take 1 tablet (2 mg total) by mouth at bedtime. Take immediately before bedtime  . losartan (COZAAR) 50 MG tablet Take 1 tablet (50 mg total) by mouth daily.  . nitrofurantoin (MACRODANTIN) 50 MG capsule Take 1 capsule (50 mg total) by mouth daily.  Marland Kitchen Propylene Glycol (SYSTANE BALANCE) 0.6 % SOLN Apply 1 drop to eye daily as needed.   Facility-Administered Encounter Medications as of 01/04/2019  Medication  . 0.9 %  sodium chloride infusion    Review of Systems  Constitutional: Negative for chills, fever and malaise/fatigue.  HENT: Negative for congestion.   Eyes: Negative for blurred vision.       Glasses; ocular htn  Respiratory: Negative for cough and shortness of breath.   Cardiovascular: Negative for chest pain, palpitations and leg swelling.  Gastrointestinal: Positive for constipation and diarrhea. Negative for abdominal pain, blood in stool, melena, nausea and vomiting.  Genitourinary: Negative for dysuria.       I/o caths since bladder procedure after her radiation txs  Skin: Negative for itching and rash.  Neurological: Negative for dizziness and loss of consciousness.  Psychiatric/Behavioral: Negative for depression and memory loss. The patient is not nervous/anxious and does not have insomnia.     Immunization History  Administered Date(s) Administered  . Influenza, High Dose Seasonal PF 09/26/2014, 08/17/2015, 08/19/2016, 08/20/2017, 08/24/2018  . Pneumococcal Conjugate-13 09/07/2014  . Pneumococcal Polysaccharide-23 09/21/2007  . Tdap 05/23/2010  . Zoster 10/24/2005  . Zoster Recombinat (Shingrix) 09/22/2017, 11/25/2017   Pertinent  Health Maintenance Due  Topic Date Due  .  INFLUENZA VACCINE  Completed  . DEXA SCAN  Completed  . PNA vac Low Risk Adult  Discontinued   Fall Risk  10/20/2018 08/24/2018  Falls in the past year? 0 No  Number falls in past yr: 0 -  Injury with Fall? 0 -  Risk for fall due to : - Impaired balance/gait   Functional Status Survey:    Vitals:   01/04/19 1927  BP: 118/77  Pulse: 82  Resp: 18  Weight: 198 lb 12.8 oz (90.2 kg)   Body mass index is 33.6 kg/m. Physical Exam  Labs reviewed: Recent Labs    12/14/18 0316 12/15/18 0357 12/19/18 1700  NA 142 139 138  K 4.4 3.5 3.4*  CL 108 106 103  CO2 _0 GLUCOSE 102* 93 100*  BUN _1 CREATININE 0.78 0.64 0.58  CALCIUM 8.8* 8.2* 9.0   Recent Labs    08/24/18 1111 12/14/18 0316 12/19/18 1700  AST 20 29 42*  ALT _0 ALKPHOS 121* 83 118  BILITOT 0.4 1.1 0.2*  PROT 6.7 6.0* 6.7  ALBUMIN 4.2 3.4* 3.5   Recent Labs    12/14/18 0316 12/15/18 0357 12/19/18 1700  WBC 5.6 6.3 5.8  NEUTROABS 4.5 4.4 4.0  HGB 11.3* 10.1* 11.4*  HCT 38.0 32.6* 37.3  MCV 98.7 96.7 96.6  PLT 168 148* 229   No results found for: TSH No results found for: HGBA1C Lab Results  Component Value Date   CHOL 138 08/24/2018   HDL 55.10 08/24/2018   LDLCALC 59 08/24/2018   TRIG 123.0 08/24/2018   CHOLHDL 3 08/24/2018    Significant Diagnostic Results in last 30 days:  Dg Chest 2 View  Result Date: 12/14/2018 CLINICAL DATA:  Acute onset of cough and shortness of breath. EXAM: CHEST - 2 VIEW COMPARISON:  None. FINDINGS: The lungs are well-aerated. Left perihilar airspace opacification raises concern for pneumonia. There is no evidence of pleural effusion or pneumothorax. The heart is normal in size; the mediastinal contour is within normal limits. No acute osseous abnormalities are seen. IMPRESSION: Left perihilar airspace opacification raises concern for pneumonia. Electronically Signed   By: Garald Balding M.D.   On: 12/14/2018 03:33   Ct Abdomen Pelvis W  Contrast  Result Date: 12/19/2018 CLINICAL DATA:  Bowel obstruction, acute abdominal pain EXAM: CT ABDOMEN AND PELVIS WITH CONTRAST TECHNIQUE: Multidetector CT imaging of the abdomen and pelvis was performed using the standard protocol following bolus administration of intravenous contrast. Sagittal and coronal MPR images reconstructed from axial data set. CONTRAST:  164m ISOVUE-300 IOPAMIDOL (ISOVUE-300) INJECTION 61% IV. No oral contrast. COMPARISON:  None FINDINGS: Lower chest: Small bibasilar pleural effusions and adjacent compressive atelectasis of the lower lobes. Hepatobiliary: Probable focal fatty infiltration of liver adjacent to falciform fissure. Tiny calcified hepatic granulomata. Gallbladder and liver otherwise normal appearance. Pancreas: Normal appearance Spleen: Tiny calcified granulomata.  Otherwise normal appearance. Adrenals/Urinary Tract: Adrenal glands normal appearance. Extrarenal pelvis RIGHT kidney. Kidneys, ureters, and bladder otherwise normal appearance. Stomach/Bowel: Mildly prominent stool throughout colon. Stomach decompressed with question small hiatal hernia. Bowel anastomosis in the LEFT mid abdomen. No definite evidence of bowel obstruction or bowel dilatation. No bowel wall thickening. Infiltrative changes of presacral and perirectal fat, nonspecific, without associated rectal wall changes. Remaining bowel loops unremarkable. Vascular/Lymphatic: Atherosclerotic calcifications aorta and iliac arteries. Aorta normal caliber. Normal sized celiac axis node 8 mm diameter image 20. No abdominal or pelvic adenopathy. Reproductive: Uterus surgically absent. Nonvisualization of ovaries. Other: No free air or free fluid.  No hernia. Musculoskeletal: Bones demineralized. Degenerative disc and facet disease changes lumbar spine with minimal anterolisthesis at L4-L5. Mixed lytic and sclerotic process involving the RIGHT acetabulum associated with a nondisplaced acetabular fracture, question  pathologic fracture through an underlying bone lesion or metastatic focus or sequela of subacute trauma/fracture. IMPRESSION: Mild perirectal and presacral edema without associated rectal wall thickening or definite inflammatory changes; these findings are nonspecific, could be related to subtle proctitis or other causes. Mildly increased stool throughout colon. Mixed lytic and sclerotic appearance of the RIGHT acetabulum with associated nondisplaced fracture, question pathologic fracture through an underlying bone lesion or metastatic focus versus sequela of a subacute fracture. Electronically Signed   By: MLavonia DanaM.D.   On: 12/19/2018 18:50    Assessment/Plan 1. Chronic constipation with overflow incontinence -hopefully  will improve with regular miralax use -off imodium, if incontinence continues, reassessment with GI may be needed  2. H/O Cancer of vaginal vault (HCC) -s/p hysterectomy and left salpingoophorectomy, some fh/o BRCA mutation she reports  3. Lumbar foraminal stenosis -s/p injection with improvement  4. MALT lymphoma ([place]) -s/p txs completed last year, now with new mystery lesion in right hip, is for PET scan to work this up -will provide xanax 0.62m once before she leaves here to go get the test done to calm her nerves at her request (has some claustrophobia)  5. Incomplete emptying of bladder -ongoing, cont usual I/O cath regimen which has been effective for her long-term and macrodantin prophylaxis that was prescribed by urology  Family/ staff Communication: discussed with pt and rehab nurse  Labs/tests ordered:  No new today  THopeL. Ventura Hollenbeck, D.O. [facility] [address] [place] [address]Cell Phone (Mon-Fri 8am-5pm):  [phone] Call:  [phone]& follow prompts after 5pm & weekends Office Phone:  [phone] Fax:  [phone]

## 2019-01-05 ENCOUNTER — Telehealth: Payer: Self-pay

## 2019-01-05 DIAGNOSIS — C52 Malignant neoplasm of vagina: Secondary | ICD-10-CM | POA: Diagnosis not present

## 2019-01-05 DIAGNOSIS — R2689 Other abnormalities of gait and mobility: Secondary | ICD-10-CM | POA: Diagnosis not present

## 2019-01-05 DIAGNOSIS — S3289XS Fracture of other parts of pelvis, sequela: Secondary | ICD-10-CM | POA: Diagnosis not present

## 2019-01-05 DIAGNOSIS — R278 Other lack of coordination: Secondary | ICD-10-CM | POA: Diagnosis not present

## 2019-01-05 DIAGNOSIS — M48061 Spinal stenosis, lumbar region without neurogenic claudication: Secondary | ICD-10-CM | POA: Diagnosis not present

## 2019-01-05 DIAGNOSIS — J189 Pneumonia, unspecified organism: Secondary | ICD-10-CM | POA: Diagnosis not present

## 2019-01-05 NOTE — Telephone Encounter (Signed)
Nurse Manuela Schwartz called and left message to call her.  Called back. She is requesting last office note of Dr. Alvy Bimler. Faxed office note to [phone].

## 2019-01-10 ENCOUNTER — Ambulatory Visit: Payer: Medicare Other | Admitting: Gastroenterology

## 2019-01-10 ENCOUNTER — Encounter (HOSPITAL_COMMUNITY)
Admission: RE | Admit: 2019-01-10 | Discharge: 2019-01-10 | Disposition: A | Discharge status: Home / Self Care | Payer: Medicare Other | Source: Ambulatory Visit | Attending: Hematology and Oncology | Admitting: Hematology and Oncology

## 2019-01-10 DIAGNOSIS — C884 Extranodal marginal zone B-cell lymphoma of mucosa-associated lymphoid tissue [MALT-lymphoma]: Secondary | ICD-10-CM | POA: Insufficient documentation

## 2019-01-10 DIAGNOSIS — C52 Malignant neoplasm of vagina: Secondary | ICD-10-CM

## 2019-01-10 LAB — GLUCOSE, CAPILLARY: Glucose-Capillary: 79 mg/dL (ref 70–99)

## 2019-01-10 MED ORDER — FLUDEOXYGLUCOSE F - 18 (FDG) INJECTION
9.8000 | Freq: Once | INTRAVENOUS | Status: AC | PRN
  Administered 2019-01-10: 9.8 via INTRAVENOUS

## 2019-01-11 ENCOUNTER — Inpatient Hospital Stay (HOSPITAL_BASED_OUTPATIENT_CLINIC_OR_DEPARTMENT_OTHER): Payer: Medicare Other | Admitting: Hematology and Oncology

## 2019-01-11 ENCOUNTER — Encounter: Payer: Self-pay | Admitting: Hematology and Oncology

## 2019-01-11 DIAGNOSIS — Z79899 Other long term (current) drug therapy: Secondary | ICD-10-CM

## 2019-01-11 DIAGNOSIS — G8929 Other chronic pain: Secondary | ICD-10-CM | POA: Diagnosis not present

## 2019-01-11 DIAGNOSIS — Z8542 Personal history of malignant neoplasm of other parts of uterus: Secondary | ICD-10-CM

## 2019-01-11 DIAGNOSIS — K5909 Other constipation: Secondary | ICD-10-CM

## 2019-01-11 DIAGNOSIS — I10 Essential (primary) hypertension: Secondary | ICD-10-CM

## 2019-01-11 DIAGNOSIS — C884 Extranodal marginal zone B-cell lymphoma of mucosa-associated lymphoid tissue [MALT-lymphoma]: Secondary | ICD-10-CM | POA: Diagnosis not present

## 2019-01-11 DIAGNOSIS — M549 Dorsalgia, unspecified: Secondary | ICD-10-CM | POA: Diagnosis not present

## 2019-01-11 DIAGNOSIS — S32401D Unspecified fracture of right acetabulum, subsequent encounter for fracture with routine healing: Secondary | ICD-10-CM

## 2019-01-11 DIAGNOSIS — C52 Malignant neoplasm of vagina: Secondary | ICD-10-CM

## 2019-01-11 DIAGNOSIS — S32401A Unspecified fracture of right acetabulum, initial encounter for closed fracture: Secondary | ICD-10-CM | POA: Insufficient documentation

## 2019-01-11 NOTE — Progress Notes (Signed)
[place] OFFICE PROGRESS NOTE  Patient Care Team: Gayland Curry, DO as PCP - General (Geriatric Medicine) Rolm Bookbinder, MD as Consulting Physician (Dermatology) Marybelle Killings, MD as Consulting Physician (Orthopedic Surgery) Jola Schmidt, MD as Consulting Physician (Ophthalmology) Milus Banister, MD as Attending Physician (Gastroenterology) Regal, Tamala Fothergill, DPM as Consulting Physician (Podiatry) Lucas Mallow, MD as Consulting Physician (Urology) Jodi Marble, MD as Consulting Physician (Otolaryngology)  ASSESSMENT & PLAN:  MALT lymphoma [facility]) I have reviewed PET CT scan with the patient and her son She has no signs or symptoms to suggest recurrence of lymphoma I recommend repeat EGD in the year of 2022  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  She has genetic counseling appointment pending  Fracture of right acetabulum [facility]) I have reviewed multiple imaging studies with the patient and her son The patient is not symptomatic Her previously noted pain in that region had resolved, and it was unlikely related to the fracture seen on recent imaging study I recommend gradual physical therapy as tolerated with full weightbearing  Chronic constipation with overflow incontinence Her previous abdominal pain was related to constipation.  Since her last visit, she has discontinued Imodium and started taking low-dose MiraLAX daily.  Her symptoms has completely resolved   No orders of the defined types were placed in this encounter.   INTERVAL HISTORY: Please see below for problem oriented charting. She returns with her son for further follow-up and review of imaging studies Since last time I saw her, she started taking regular laxative.  Her abdominal symptoms is completely resolved and she had good regular bowel movement.  She denies pelvic pain or hip pain.  She is able to walk and is interested to discontinue rehab and to  return back to home.  She has not started to negotiate stairs yet.  SUMMARY OF ONCOLOGIC HISTORY:   MALT lymphoma ([place])   05/09/2016 Pathology Results    Stomach Ulcer: Biopsy in [place] - Small B-cell lymphoma, most consistent with extranodal marginal zone lymphoma     12/19/2018 Imaging    Ct abdomen and pelvis Mild perirectal and presacral edema without associated rectal wall thickening or definite inflammatory changes; these findings are nonspecific, could be related to subtle proctitis or other causes.  Mildly increased stool throughout colon.  Mixed lytic and sclerotic appearance of the RIGHT acetabulum with associated nondisplaced fracture, question pathologic fracture through an underlying bone lesion or metastatic focus versus sequela of a subacute fracture.    12/30/2018 Cancer Staging    Staging form: Hodgkin and Non-Hodgkin Lymphoma, AJCC 8th Edition - Clinical stage from 12/30/2018: Stage IE - Signed by Heath Lark, MD on 12/30/2018    01/10/2019 PET scan    1. Again seen is a fracture deformity involving the right acetabulum with associated increased sclerosis. The appearance is nonspecific without significant soft tissue component. Moderate increased uptake associated with the fracture deformity has an SUV max of 6.18. 2. No hypermetabolic adenopathy or soft tissue mass identified 3. Mild nonspecific increased uptake within the distal esophagus. This could be due to esophagitis. No distinct hypermetabolic mass noted.     REVIEW OF SYSTEMS:   Constitutional: Denies fevers, chills or abnormal weight loss Eyes: Denies blurriness of vision Ears, nose, mouth, throat, and face: Denies mucositis or sore throat Respiratory: Denies cough, dyspnea or wheezes Cardiovascular: Denies palpitation, chest discomfort or lower extremity swelling Gastrointestinal:  Denies nausea, heartburn or change  in bowel habits Skin: Denies abnormal skin rashes Lymphatics: Denies new  lymphadenopathy or easy bruising Neurological:Denies numbness, tingling or new weaknesses Behavioral/Psych: Mood is stable, no new changes  All other systems were reviewed with the patient and are negative.  I have reviewed the past medical history, past surgical history, social history and family history with the patient and they are unchanged from previous note.  ALLERGIES:  has No Known Allergies.  MEDICATIONS:  Current Outpatient Medications  Medication Sig Dispense Refill  . acetaminophen (TYLENOL) 500 MG tablet Take 500 mg by mouth every 6 (six) hours as needed.    . bimatoprost (LUMIGAN) 0.01 % SOLN Place 1 drop into both eyes at bedtime.    . brimonidine (ALPHAGAN) 0.15 % ophthalmic solution Place 1 drop into both eyes 3 (three) times daily.    . cetirizine (ZYRTEC) 10 MG tablet Take 10 mg by mouth at bedtime.     . eszopiclone (LUNESTA) 2 MG TABS tablet Take 1 tablet (2 mg total) by mouth at bedtime. Take immediately before bedtime 90 tablet 3  . losartan (COZAAR) 50 MG tablet Take 1 tablet (50 mg total) by mouth daily. 90 tablet 3  . nitrofurantoin (MACRODANTIN) 50 MG capsule Take 1 capsule (50 mg total) by mouth daily.    Marland Kitchen Propylene Glycol (SYSTANE BALANCE) 0.6 % SOLN Apply 1 drop to eye daily as needed.     Current Facility-Administered Medications  Medication Dose Route Frequency Provider Last Rate Last Dose  . 0.9 %  sodium chloride infusion  500 mL Intravenous Once Milus Banister, MD        PHYSICAL EXAMINATION: ECOG PERFORMANCE STATUS: 0 - Asymptomatic  Vitals:   01/11/19 1316  BP: (!) 156/66  Pulse: 61  Resp: 18  Temp: 97.7 F (36.5 C)  SpO2: 98%   There were no vitals filed for this visit.  GENERAL:alert, no distress and comfortable NEURO: alert & oriented x 3 with fluent speech, no focal motor/sensory deficits  LABORATORY DATA:  I have reviewed the data as listed    Component Value Date/Time   NA 138 12/19/2018 1700   K 3.4 (L) 12/19/2018 1700    CL 103 12/19/2018 1700   CO2 27 12/19/2018 1700   GLUCOSE 100 (H) 12/19/2018 1700   BUN 11 12/19/2018 1700   CREATININE 0.58 12/19/2018 1700   CALCIUM 9.0 12/19/2018 1700   PROT 6.7 12/19/2018 1700   ALBUMIN 3.5 12/19/2018 1700   AST 42 (H) 12/19/2018 1700   ALT 26 12/19/2018 1700   ALKPHOS 118 12/19/2018 1700   BILITOT 0.2 (L) 12/19/2018 1700   GFRNONAA >60 12/19/2018 1700   GFRAA >60 12/19/2018 1700    No results found for: SPEP, UPEP  Lab Results  Component Value Date   WBC 5.8 12/19/2018   NEUTROABS 4.0 12/19/2018   HGB 11.4 (L) 12/19/2018   HCT 37.3 12/19/2018   MCV 96.6 12/19/2018   PLT 229 12/19/2018      Chemistry      Component Value Date/Time   NA 138 12/19/2018 1700   K 3.4 (L) 12/19/2018 1700   CL 103 12/19/2018 1700   CO2 27 12/19/2018 1700   BUN 11 12/19/2018 1700   CREATININE 0.58 12/19/2018 1700      Component Value Date/Time   CALCIUM 9.0 12/19/2018 1700   ALKPHOS 118 12/19/2018 1700   AST 42 (H) 12/19/2018 1700   ALT 26 12/19/2018 1700   BILITOT 0.2 (L) 12/19/2018 1700  RADIOGRAPHIC STUDIES: I have reviewed multiple imaging studies with the patient and her son I have personally reviewed the radiological images as listed and agreed with the findings in the report. Dg Chest 2 View  Result Date: 12/14/2018 CLINICAL DATA:  Acute onset of cough and shortness of breath. EXAM: CHEST - 2 VIEW COMPARISON:  None. FINDINGS: The lungs are well-aerated. Left perihilar airspace opacification raises concern for pneumonia. There is no evidence of pleural effusion or pneumothorax. The heart is normal in size; the mediastinal contour is within normal limits. No acute osseous abnormalities are seen. IMPRESSION: Left perihilar airspace opacification raises concern for pneumonia. Electronically Signed   By: Garald Balding M.D.   On: 12/14/2018 03:33   Ct Abdomen Pelvis W Contrast  Result Date: 12/19/2018 CLINICAL DATA:  Bowel obstruction, acute abdominal pain  EXAM: CT ABDOMEN AND PELVIS WITH CONTRAST TECHNIQUE: Multidetector CT imaging of the abdomen and pelvis was performed using the standard protocol following bolus administration of intravenous contrast. Sagittal and coronal MPR images reconstructed from axial data set. CONTRAST:  164mL ISOVUE-300 IOPAMIDOL (ISOVUE-300) INJECTION 61% IV. No oral contrast. COMPARISON:  None FINDINGS: Lower chest: Small bibasilar pleural effusions and adjacent compressive atelectasis of the lower lobes. Hepatobiliary: Probable focal fatty infiltration of liver adjacent to falciform fissure. Tiny calcified hepatic granulomata. Gallbladder and liver otherwise normal appearance. Pancreas: Normal appearance Spleen: Tiny calcified granulomata.  Otherwise normal appearance. Adrenals/Urinary Tract: Adrenal glands normal appearance. Extrarenal pelvis RIGHT kidney. Kidneys, ureters, and bladder otherwise normal appearance. Stomach/Bowel: Mildly prominent stool throughout colon. Stomach decompressed with question small hiatal hernia. Bowel anastomosis in the LEFT mid abdomen. No definite evidence of bowel obstruction or bowel dilatation. No bowel wall thickening. Infiltrative changes of presacral and perirectal fat, nonspecific, without associated rectal wall changes. Remaining bowel loops unremarkable. Vascular/Lymphatic: Atherosclerotic calcifications aorta and iliac arteries. Aorta normal caliber. Normal sized celiac axis node 8 mm diameter image 20. No abdominal or pelvic adenopathy. Reproductive: Uterus surgically absent. Nonvisualization of ovaries. Other: No free air or free fluid.  No hernia. Musculoskeletal: Bones demineralized. Degenerative disc and facet disease changes lumbar spine with minimal anterolisthesis at L4-L5. Mixed lytic and sclerotic process involving the RIGHT acetabulum associated with a nondisplaced acetabular fracture, question pathologic fracture through an underlying bone lesion or metastatic focus or sequela of  subacute trauma/fracture. IMPRESSION: Mild perirectal and presacral edema without associated rectal wall thickening or definite inflammatory changes; these findings are nonspecific, could be related to subtle proctitis or other causes. Mildly increased stool throughout colon. Mixed lytic and sclerotic appearance of the RIGHT acetabulum with associated nondisplaced fracture, question pathologic fracture through an underlying bone lesion or metastatic focus versus sequela of a subacute fracture. Electronically Signed   By: Lavonia Dana M.D.   On: 12/19/2018 18:50   Nm Pet Image Initial (pi) Skull Base To Thigh  Result Date: 01/10/2019 CLINICAL DATA:  Subsequent treatment strategy for MALT lymphoma. EXAM: NUCLEAR MEDICINE PET SKULL BASE TO THIGH TECHNIQUE: 9.8 mCi F-18 FDG was injected intravenously. Full-ring PET imaging was performed from the skull base to thigh after the radiotracer. CT data was obtained and used for attenuation correction and anatomic localization. Fasting blood glucose: 79 mg/dl COMPARISON:  None. FINDINGS: Mediastinal blood pool activity: SUV max 3.05 NECK: No hypermetabolic lymph nodes in the neck. Incidental CT findings: none CHEST: No hypermetabolic mediastinal or hilar lymph nodes. No axillary or supraclavicular hypermetabolic lymph nodes. There is mild nonspecific uptake within the distal esophagus, SUV max equals 5.74. Incidental CT  findings: Aortic atherosclerosis. Calcifications in the LAD coronary artery noted. Calcified right paratracheal lymph node.Patchy scar like densities within the superior segment of left lower lobe identified with mild FDG uptake, image 38/8. There are 3 tiny ground-glass attenuating nodules in the left upper lobe measuring up to 6 mm. Too small to reliably characterize. Linear density within the superior segment of the left lower lobe is also identified, image 29/8 with the peripheral tree-in-bud nodularity, image 29/8. Likely post infectious or inflammatory.  A calcified granuloma is noted within the lateral right apex. ABDOMEN/PELVIS: No abnormal hypermetabolic activity within the liver, pancreas, adrenal glands, or spleen. No hypermetabolic lymph nodes in the abdomen or pelvis. No hypermetabolic mass identified within the stomach. There are surgical clips identified within the left pelvic sidewall presumably from prior hysterectomy and nodal dissection. No hypermetabolic pelvic or inguinal lymph nodes. Incidental CT findings: Calcified granulomas identified within the liver and spleen. SKELETON: Again seen is a fracture deformity involving the right acetabulum. Mild to moderate increased radiotracer uptake within the acetabulum has an SUV max of 6.18. Nonspecific in the setting of fracture. No additional abnormal foci of increased uptake. Incidental CT findings: none IMPRESSION: 1. Again seen is a fracture deformity involving the right acetabulum with associated increased sclerosis. The appearance is nonspecific without significant soft tissue component. Moderate increased uptake associated with the fracture deformity has an SUV max of 6.18. 2. No hypermetabolic adenopathy or soft tissue mass identified 3. Mild nonspecific increased uptake within the distal esophagus. This could be due to esophagitis. No distinct hypermetabolic mass noted. Electronically Signed   By: Kerby Moors M.D.   On: 01/10/2019 09:00    All questions were answered. The patient knows to call the clinic with any problems, questions or concerns. No barriers to learning was detected.  I spent 15 minutes counseling the patient face to face. The total time spent in the appointment was 20 minutes and more than 50% was on counseling and review of test results  Heath Lark, MD 01/11/2019 5:44 PM

## 2019-01-11 NOTE — Assessment & Plan Note (Signed)
She has no signs of cancer recurrence.  She has genetic counseling appointment pending

## 2019-01-11 NOTE — Assessment & Plan Note (Signed)
I have reviewed multiple imaging studies with the patient and her son The patient is not symptomatic Her previously noted pain in that region had resolved, and it was unlikely related to the fracture seen on recent imaging study I recommend gradual physical therapy as tolerated with full weightbearing

## 2019-01-11 NOTE — Assessment & Plan Note (Signed)
Her previous abdominal pain was related to constipation.  Since her last visit, she has discontinued Imodium and started taking low-dose MiraLAX daily.  Her symptoms has completely resolved

## 2019-01-11 NOTE — Assessment & Plan Note (Signed)
I have reviewed PET CT scan with the patient and her son She has no signs or symptoms to suggest recurrence of lymphoma I recommend repeat EGD in the year of 2022

## 2019-01-12 DIAGNOSIS — C52 Malignant neoplasm of vagina: Secondary | ICD-10-CM | POA: Diagnosis not present

## 2019-01-12 DIAGNOSIS — R2689 Other abnormalities of gait and mobility: Secondary | ICD-10-CM | POA: Diagnosis not present

## 2019-01-12 DIAGNOSIS — J189 Pneumonia, unspecified organism: Secondary | ICD-10-CM | POA: Diagnosis not present

## 2019-01-12 DIAGNOSIS — S3289XS Fracture of other parts of pelvis, sequela: Secondary | ICD-10-CM | POA: Diagnosis not present

## 2019-01-12 DIAGNOSIS — R278 Other lack of coordination: Secondary | ICD-10-CM | POA: Diagnosis not present

## 2019-01-12 DIAGNOSIS — M48061 Spinal stenosis, lumbar region without neurogenic claudication: Secondary | ICD-10-CM | POA: Diagnosis not present

## 2019-01-18 ENCOUNTER — Other Ambulatory Visit: Payer: Medicare Other

## 2019-01-18 ENCOUNTER — Inpatient Hospital Stay: Payer: Medicare Other | Attending: Hematology and Oncology | Admitting: Genetics

## 2019-01-18 ENCOUNTER — Inpatient Hospital Stay: Payer: Medicare Other

## 2019-01-18 DIAGNOSIS — Z801 Family history of malignant neoplasm of trachea, bronchus and lung: Secondary | ICD-10-CM

## 2019-01-18 DIAGNOSIS — C884 Extranodal marginal zone B-cell lymphoma of mucosa-associated lymphoid tissue [MALT-lymphoma]: Secondary | ICD-10-CM | POA: Diagnosis not present

## 2019-01-18 DIAGNOSIS — Z8051 Family history of malignant neoplasm of kidney: Secondary | ICD-10-CM

## 2019-01-18 DIAGNOSIS — C52 Malignant neoplasm of vagina: Secondary | ICD-10-CM

## 2019-01-18 DIAGNOSIS — Z8049 Family history of malignant neoplasm of other genital organs: Secondary | ICD-10-CM

## 2019-01-18 DIAGNOSIS — Z8542 Personal history of malignant neoplasm of other parts of uterus: Secondary | ICD-10-CM

## 2019-01-19 ENCOUNTER — Encounter: Payer: Self-pay | Admitting: Genetics

## 2019-01-19 ENCOUNTER — Non-Acute Institutional Stay: Payer: Medicare Other | Admitting: Internal Medicine

## 2019-01-19 ENCOUNTER — Encounter: Payer: Self-pay | Admitting: Internal Medicine

## 2019-01-19 VITALS — BP 120/70 | HR 61 | Temp 97.9°F | Ht 64.0 in | Wt 200.0 lb

## 2019-01-19 DIAGNOSIS — M810 Age-related osteoporosis without current pathological fracture: Secondary | ICD-10-CM | POA: Diagnosis not present

## 2019-01-19 DIAGNOSIS — Z8542 Personal history of malignant neoplasm of other parts of uterus: Secondary | ICD-10-CM | POA: Insufficient documentation

## 2019-01-19 DIAGNOSIS — J189 Pneumonia, unspecified organism: Secondary | ICD-10-CM

## 2019-01-19 DIAGNOSIS — M8000XS Age-related osteoporosis with current pathological fracture, unspecified site, sequela: Secondary | ICD-10-CM | POA: Diagnosis not present

## 2019-01-19 DIAGNOSIS — Z801 Family history of malignant neoplasm of trachea, bronchus and lung: Secondary | ICD-10-CM | POA: Insufficient documentation

## 2019-01-19 DIAGNOSIS — K5909 Other constipation: Secondary | ICD-10-CM

## 2019-01-19 DIAGNOSIS — Z8049 Family history of malignant neoplasm of other genital organs: Secondary | ICD-10-CM | POA: Insufficient documentation

## 2019-01-19 DIAGNOSIS — Z8051 Family history of malignant neoplasm of kidney: Secondary | ICD-10-CM | POA: Insufficient documentation

## 2019-01-19 HISTORY — DX: Personal history of malignant neoplasm of other parts of uterus: Z85.42

## 2019-01-19 MED ORDER — VITAMIN D3 50 MCG (2000 UT) PO CHEW: 1.0000 | CHEWABLE_TABLET | Freq: Every day | ORAL | 3 refills | Status: DC

## 2019-01-19 NOTE — Progress Notes (Signed)
Location:  [facility] clinic Provider: Oyinkansola Truax L. Mariea Clonts, D.O., C.M.D.  Goals of Care:  Advanced Directives 12/22/2018  Does Patient Have a Medical Advance Directive? Yes  Type of Advance Directive [place]  Does patient want to make changes to medical advance directive? No - Patient declined  Copy of [place] in Chart? Yes - validated most recent copy scanned in chart (See row information)     Chief Complaint  Patient presents with  . Follow-up    discharged from Rehab    HPI: Patient is a 85 y.o. female seen today for hospital follow-up s/p admission to rehab due to right acetabular fracture.    She wanted to f/u on the pneumonia.  She is to have a chest xray.    Has been taking 1/2packet of miralax qod.  Not using senokot.  That is keeping her bowels solid enough.  Does have a bm after each meal.  Things are moving.  No incontinence now.  She's not sure if she can count on going in the pool with this.  She wants to try 1/2 scoop every three days.    She is using crestor for cholesterol.   Using max-freeze on some posterior left shoulder pain.  She attributes it to her posture when she was walking abnormally with the right acetabular fx.    Bones are weak.  Asks about adding vitamin D and or calcium.  Had osteopenia on bone density but has not taken medication for her bones.  Recommended Vitamin D3.    Past Medical History:  Diagnosis Date  . Allergy   . Anxiety   . Arthritis   . Bursitis    right leg  . Family history of kidney cancer   . Family history of lung cancer   . Family history of uterine cancer   . GERD (gastroesophageal reflux disease)   . History of blood transfusion   . History of uterine cancer 01/19/2019  . Hyperlipidemia   . Hypertension   . Insomnia   . Non Hodgkin's lymphoma ([place]) 2017-2019   cured  . Osteopenia    MILD  . Urine retention   . Uterine cancer ([place])    at age 82    Past Surgical History:    Procedure Laterality Date  . ABDOMINAL HYSTERECTOMY     AGE 43  . APPENDECTOMY     AGE 5  . BLADDER SURGERY    . Catarct Surgery    . ECTOPIC PREGNANCY SURGERY     age 62  . LAPAROSCOPIC SMALL BOWEL RESECTION    . NM PET DX LYMPHOMA  10/2018   IN REMISSION  . occular pressure    . OTHER SURGICAL HISTORY    . REPLACEMENT TOTAL KNEE BILATERAL    . TONSILLECTOMY AND ADENOIDECTOMY     age 32 or 41  . UTERINE CANCER SURGERY     age 54- located in vagina    No Known Allergies  Outpatient Encounter Medications as of 01/19/2019  Medication Sig  . acetaminophen (TYLENOL) 500 MG tablet Take 500 mg by mouth every 6 (six) hours as needed.  . bimatoprost (LUMIGAN) 0.01 % SOLN Place 1 drop into both eyes at bedtime.  . brimonidine (ALPHAGAN) 0.15 % ophthalmic solution Place 1 drop into both eyes 3 (three) times daily.  . cetirizine (ZYRTEC) 10 MG tablet Take 10 mg by mouth at bedtime.   . eszopiclone (LUNESTA) 2 MG TABS tablet Take 1 tablet (  2 mg total) by mouth at bedtime. Take immediately before bedtime  . losartan (COZAAR) 50 MG tablet Take 1 tablet (50 mg total) by mouth daily.  . nitrofurantoin (MACRODANTIN) 50 MG capsule Take 1 capsule (50 mg total) by mouth daily.  . polyethylene glycol (MIRALAX / GLYCOLAX) packet Take 17 g by mouth every other day.  Marland Kitchen Propylene Glycol (SYSTANE BALANCE) 0.6 % SOLN Apply 1 drop to eye daily as needed.  . rosuvastatin (CRESTOR) 5 MG tablet Take 5 mg by mouth at bedtime.   Facility-Administered Encounter Medications as of 01/19/2019  Medication  . 0.9 %  sodium chloride infusion    Review of Systems:  Review of Systems  Constitutional: Negative for chills, fever, malaise/fatigue and weight loss.  HENT: Negative for congestion.   Eyes: Negative for blurred vision.       Glasses  Respiratory: Negative for cough, hemoptysis, sputum production, shortness of breath and wheezing.   Cardiovascular: Negative for chest pain, palpitations and leg swelling.   Gastrointestinal: Positive for constipation. Negative for abdominal pain, blood in stool, diarrhea, melena, nausea and vomiting.       Bowels improved--going after each meal, but not having fecal smearing or incontinence  Genitourinary: Negative for dysuria.       Chronic retention with I/O caths required  Musculoskeletal: Negative for falls, joint pain and myalgias.  Skin: Negative for itching and rash.  Neurological: Negative for dizziness, loss of consciousness and headaches.  Endo/Heme/Allergies: Does not bruise/bleed easily.  Psychiatric/Behavioral: Negative for depression and memory loss. The patient is not nervous/anxious and does not have insomnia.     Health Maintenance  Topic Date Due  . INFLUENZA VACCINE  Completed  . DEXA SCAN  Completed  . TETANUS/TDAP  Discontinued  . PNA vac Low Risk Adult  Discontinued    Physical Exam: Vitals:   01/19/19 1436  BP: 120/70  Pulse: 61  Temp: 97.9 F (36.6 C)  TempSrc: Oral  SpO2: 97%  Weight: 200 lb (90.7 kg)  Height: 5\' 4"  (1.626 m)   Body mass index is 34.33 kg/m. Physical Exam Vitals signs reviewed.  Constitutional:      Appearance: Normal appearance. She is obese.  HENT:     Head: Normocephalic and atraumatic.  Neck:     Musculoskeletal: Neck supple.  Cardiovascular:     Rate and Rhythm: Normal rate and regular rhythm.     Pulses: Normal pulses.     Heart sounds: Normal heart sounds.  Pulmonary:     Effort: Pulmonary effort is normal.     Breath sounds: Normal breath sounds.  Abdominal:     General: Abdomen is flat. Bowel sounds are normal.     Palpations: Abdomen is soft.  Musculoskeletal: Normal range of motion.  Skin:    General: Skin is warm and dry.     Capillary Refill: Capillary refill takes less than 2 seconds.  Neurological:     General: No focal deficit present.     Mental Status: She is alert and oriented to person, place, and time.     Cranial Nerves: No cranial nerve deficit.     Motor: No  weakness.     Gait: Gait normal.  Psychiatric:        Mood and Affect: Mood normal.        Behavior: Behavior normal.        Thought Content: Thought content normal.        Judgment: Judgment normal.     Labs  reviewed: Basic Metabolic Panel: Recent Labs    12/14/18 0316 12/15/18 0357 12/19/18 1700  NA 142 139 138  K 4.4 3.5 3.4*  CL 108 106 103  CO2 26 25 27   GLUCOSE 102* 93 100*  BUN 12 8 11   CREATININE 0.78 0.64 0.58  CALCIUM 8.8* 8.2* 9.0   Liver Function Tests: Recent Labs    08/24/18 1111 12/14/18 0316 12/19/18 1700  AST 20 29 42*  ALT 14 17 26   ALKPHOS 121* 83 118  BILITOT 0.4 1.1 0.2*  PROT 6.7 6.0* 6.7  ALBUMIN 4.2 3.4* 3.5   Recent Labs    12/14/18 0316 12/19/18 1700  LIPASE 19 25   No results for input(s): AMMONIA in the last 8760 hours. CBC: Recent Labs    12/14/18 0316 12/15/18 0357 12/19/18 1700  WBC 5.6 6.3 5.8  NEUTROABS 4.5 4.4 4.0  HGB 11.3* 10.1* 11.4*  HCT 38.0 32.6* 37.3  MCV 98.7 96.7 96.6  PLT 168 148* 229   Lipid Panel: Recent Labs    08/24/18 1111  CHOL 138  HDL 55.10  LDLCALC 59  TRIG 123.0  CHOLHDL 3   No results found for: HGBA1C  Procedures since last visit: Nm Pet Image Initial (pi) Skull Base To Thigh  Result Date: 01/10/2019 CLINICAL DATA:  Subsequent treatment strategy for MALT lymphoma. EXAM: NUCLEAR MEDICINE PET SKULL BASE TO THIGH TECHNIQUE: 9.8 mCi F-18 FDG was injected intravenously. Full-ring PET imaging was performed from the skull base to thigh after the radiotracer. CT data was obtained and used for attenuation correction and anatomic localization. Fasting blood glucose: 79 mg/dl COMPARISON:  None. FINDINGS: Mediastinal blood pool activity: SUV max 3.05 NECK: No hypermetabolic lymph nodes in the neck. Incidental CT findings: none CHEST: No hypermetabolic mediastinal or hilar lymph nodes. No axillary or supraclavicular hypermetabolic lymph nodes. There is mild nonspecific uptake within the distal  esophagus, SUV max equals 5.74. Incidental CT findings: Aortic atherosclerosis. Calcifications in the LAD coronary artery noted. Calcified right paratracheal lymph node.Patchy scar like densities within the superior segment of left lower lobe identified with mild FDG uptake, image 38/8. There are 3 tiny ground-glass attenuating nodules in the left upper lobe measuring up to 6 mm. Too small to reliably characterize. Linear density within the superior segment of the left lower lobe is also identified, image 29/8 with the peripheral tree-in-bud nodularity, image 29/8. Likely post infectious or inflammatory. A calcified granuloma is noted within the lateral right apex. ABDOMEN/PELVIS: No abnormal hypermetabolic activity within the liver, pancreas, adrenal glands, or spleen. No hypermetabolic lymph nodes in the abdomen or pelvis. No hypermetabolic mass identified within the stomach. There are surgical clips identified within the left pelvic sidewall presumably from prior hysterectomy and nodal dissection. No hypermetabolic pelvic or inguinal lymph nodes. Incidental CT findings: Calcified granulomas identified within the liver and spleen. SKELETON: Again seen is a fracture deformity involving the right acetabulum. Mild to moderate increased radiotracer uptake within the acetabulum has an SUV max of 6.18. Nonspecific in the setting of fracture. No additional abnormal foci of increased uptake. Incidental CT findings: none IMPRESSION: 1. Again seen is a fracture deformity involving the right acetabulum with associated increased sclerosis. The appearance is nonspecific without significant soft tissue component. Moderate increased uptake associated with the fracture deformity has an SUV max of 6.18. 2. No hypermetabolic adenopathy or soft tissue mass identified 3. Mild nonspecific increased uptake within the distal esophagus. This could be due to esophagitis. No distinct hypermetabolic mass noted. Electronically  Signed   By:  Kerby Moors M.D.   On: 01/10/2019 09:00    Assessment/Plan 1. Chronic constipation with overflow incontinence -doing well with miralax use--she wants to cut down a little to see if she can get to where she has one less bm per day  2. Osteoporosis with pathological fracture, sequela - DG Bone Density; Future - Cholecalciferol (VITAMIN D3) 50 MCG (2000 UT) CHEW; Chew 1 capsule by mouth daily.  Dispense: 30 tablet; Refill: 3 -return to water walking in the pool when it reopens  3. Community acquired pneumonia of left lung, unspecified part of lung -? Scarring all along and symptoms were related to her constipation and some degree of obstruction all along as outside of her fever, she really did not have a classic presentation for pneumonia -f/u cxr as recommended at d/c - DG Chest 2 View  4. Senile osteoporosis - for some reason osteoporosis with pathologic fx does not cover a bone density so had to enter this - DG Bone Density; Future   Labs/tests ordered:   Orders Placed This Encounter  Procedures  . DG Chest 2 View    Order Specific Question:   Reason for Exam (SYMPTOM  OR DIAGNOSIS REQUIRED)    Answer:   f/u on left perihilar pneumonia    Order Specific Question:   Preferred imaging location?    Answer:   [facility]  . DG Bone Density    Standing Status:   Future    Standing Expiration Date:   03/19/2020    Order Specific Question:   Reason for Exam (SYMPTOM  OR DIAGNOSIS REQUIRED)    Answer:   osteopenia with prior bone density about 5 yrs ago now with right acetabular fracture    Order Specific Question:   Preferred imaging location?    Answer:   [facility]    Next appt:  02/23/2019  Ivelisse Culverhouse L. Maressa Apollo, D.O. [facility] [address], [place] [address] Cell Phone (Mon-Fri 8am-5pm):  [phone] On Call:  [phone] & follow prompts after 5pm & weekends Office Phone:  [phone] Office Fax:   [phone]

## 2019-01-19 NOTE — Patient Instructions (Addendum)
So glad you did not have cancer in your pelvis!    Ok to use the max freeze on your left shoulder.  Start Vitamin D3 2000 units daily for your osteoporosis.  We know you have that now because you've had a fracture.  Let's check your bone density at the [facility] [place].  Please get a follow-up chest xray at [facility] to make sure your left sided pneumonia cleared up.    It's ok to try to take 1/2 scoop of miralax every 3 days instead of every other day to decrease the frequency of your bms a little bit.  Hopefully, when the pool opens, you'll be able to get back in without a problem.

## 2019-01-19 NOTE — Progress Notes (Signed)
REFERRING PROVIDER: Heath Lark, MD [place], [place] [address]  PRIMARY PROVIDER:  Gayland Curry, DO  PRIMARY REASON FOR VISIT:  1. MALT lymphoma ([place])   2. Family history of uterine cancer   3. Family history of kidney cancer   4. Family history of lung cancer   5. H/O Cancer of vaginal vault ([place])   6. History of uterine cancer     HISTORY OF PRESENT ILLNESS:   Linda [name], a 85 y.o. female, was seen for a [facility] cancer genetics consultation at the request of Dr. Alvy Bimler due to a personal and family history of cancer.  Linda [name] presents to clinic today to discuss the possibility of a hereditary predisposition to cancer, genetic testing, and to further clarify her future cancer risks, as well as potential cancer risks for family members.   Linda [name] has a history of MALT lymphoma.  She also has a history of cancer of the vagina at age 29 that was endometrial cancer under pathology examination.    CANCER HISTORY:    MALT lymphoma ([place])   05/09/2016 Pathology Results    Stomach Ulcer: Biopsy in [place] - Small B-cell lymphoma, most consistent with extranodal marginal zone lymphoma     12/19/2018 Imaging    Ct abdomen and pelvis Mild perirectal and presacral edema without associated rectal wall thickening or definite inflammatory changes; these findings are nonspecific, could be related to subtle proctitis or other causes.  Mildly increased stool throughout colon.  Mixed lytic and sclerotic appearance of the RIGHT acetabulum with associated nondisplaced fracture, question pathologic fracture through an underlying bone lesion or metastatic focus versus sequela of a subacute fracture.    12/30/2018 Cancer Staging    Staging form: Hodgkin and Non-Hodgkin Lymphoma, AJCC 8th Edition - Clinical stage from 12/30/2018: Stage IE - Signed by Heath Lark, MD on 12/30/2018    01/10/2019 PET scan    1. Again seen is a fracture deformity involving  the right acetabulum with associated increased sclerosis. The appearance is nonspecific without significant soft tissue component. Moderate increased uptake associated with the fracture deformity has an SUV max of 6.18. 2. No hypermetabolic adenopathy or soft tissue mass identified 3. Mild nonspecific increased uptake within the distal esophagus. This could be due to esophagitis. No distinct hypermetabolic mass noted.      HORMONAL RISK FACTORS:  Menarche was at age 62.5 Ovaries intact: no.  Hysterectomy: yes.  Menopausal status: postmenopausal.  HRT use: 1-2 years. Colonoscopy: yes; reports nothing was 'concerning' had them every 5 years for a while.  . Number of breast biopsies: 0. Reports no other lumps/bumps/lesions removed.  Denies macrocephaly (self report, not measured)   Past Medical History:  Diagnosis Date  . Allergy   . Anxiety   . Arthritis   . Bursitis    right leg  . Family history of kidney cancer   . Family history of lung cancer   . Family history of uterine cancer   . GERD (gastroesophageal reflux disease)   . History of blood transfusion   . History of uterine cancer 01/19/2019  . Hyperlipidemia   . Hypertension   . Insomnia   . Non Hodgkin's lymphoma ([place]) 2017-2019   cured  . Osteopenia    MILD  . Urine retention   . Uterine cancer ([place])    at age 45    Past Surgical History:  Procedure Laterality Date  . ABDOMINAL HYSTERECTOMY     AGE 6  .  APPENDECTOMY     AGE 28  . BLADDER SURGERY    . Catarct Surgery    . ECTOPIC PREGNANCY SURGERY     age 47  . LAPAROSCOPIC SMALL BOWEL RESECTION    . NM PET DX LYMPHOMA  10/2018   IN REMISSION  . occular pressure    . OTHER SURGICAL HISTORY    . REPLACEMENT TOTAL KNEE BILATERAL    . TONSILLECTOMY AND ADENOIDECTOMY     age 41 or 32  . UTERINE CANCER SURGERY     age 71- located in vagina    Social History   Socioeconomic History  . Marital status: Single    Spouse name: Not on file  . Number  of children: Not on file  . Years of education: Not on file  . Highest education level: Not on file  Occupational History  . Occupation: retired  Scientific laboratory technician  . Financial resource strain: Not on file  . Food insecurity:    Worry: Not on file    Inability: Not on file  . Transportation needs:    Medical: Not on file    Non-medical: Not on file  Tobacco Use  . Smoking status: Never Smoker  . Smokeless tobacco: Never Used  Substance and Sexual Activity  . Alcohol use: Yes    Alcohol/week: 7.0 standard drinks    Types: 7 Glasses of wine per week    Comment: one glass of wine with dinner  . Drug use: No  . Sexual activity: Never  Lifestyle  . Physical activity:    Days per week: Not on file    Minutes per session: Not on file  . Stress: Not on file  Relationships  . Social connections:    Talks on phone: Not on file    Gets together: Not on file    Attends religious service: Not on file    Active member of club or organization: Not on file    Attends meetings of clubs or organizations: Not on file    Relationship status: Not on file  Other Topics Concern  . Not on file  Social History Narrative   Social History      Diet? Low to no fiber diet; lactose (no tolerance), low or no soy      Do you drink/eat things with caffeine? No liquid caffeine, occasional chocolate      Marital status?                divorced                    What year were you married? n/a      Do you live in a house, apartment, assisted living, condo, trailer, etc.? Apartment/ retirement      Is it one or more stories? one      How many persons live in your home? one      Do you have any pets in your home? (please list) no      Highest level of education completed? Master degree      Current or past profession: Audiologist/ Speech Therapist      Do you exercise?                 yes                     Type & how often? Water aerobics 3 x week      Advanced Directives  Do you have a  living will? yes      Do you have a DNR form?        yes                         If not, do you want to discuss one?      Do you have signed POA/HPOA for forms? yes      Functional Status : completed by self      Do you have difficulty bathing or dressing yourself? no      Do you have difficulty preparing food or eating? no      Do you have difficulty managing your medications? No      Do you have difficulty managing your finances? no      Do you have difficulty affording your medications? no     FAMILY HISTORY:  We obtained a detailed, 4-generation family history.  Significant diagnoses are listed below: Family History  Problem Relation Age of Onset  . Uterine cancer Sister 2       uterine cancer  . COPD Maternal Grandfather   . Lung cancer Maternal Grandfather   . Heart failure Mother 16  . Pneumonia Father 59  . Dementia Father   . Uterine cancer Maternal Grandmother 62  . Other Sister        Guillain Barre syndrom  . Kidney cancer Son 69  . Dementia Paternal Grandmother   . Lung cancer Maternal Uncle   . Colon cancer Neg Hx   . Esophageal cancer Neg Hx   . Liver cancer Neg Hx   . Pancreatic cancer Neg Hx   . Rectal cancer Neg Hx   . Stomach cancer Neg Hx    Linda [name] has 3 sons in their 4's. 1 of her sonts has Familial Hypercholesterolemia.  Her other son had kidney cancer at 47.   Linda [name] has 2 sisters. 1 sister was dx with uterine cancer at 59.   Linda [name] father: died at 8 with no hx of cancer.  Paternal Aunts/Uncles: 2 paternal uncles with no hx of cancer.  Paternal cousins: no hx of cancer Paternal grandfather: died in his 43's due to pneumonia Paternal grandmother:died in her 68's/80's with dementia.   Linda [name] mother: died at 93 due to heart failure.   Maternal Aunts/Uncles: 1 maternal aunt and 2 maternal uncles. 1 uncle had lung cancer.  Maternal cousins: no known hx of cancer.  Maternal grandfather: died with lung  cancer Maternal grandmother:uterine cancer dx at 83  Patient reports no Autism or Learning diabilities in the family.  Patient reports some relatives have larger heads (need larger hats/helmets, etc).  Linda [name] is unaware of previous family history of genetic testing for hereditary cancer risks. Patient's maternal ancestors are of European descent, and paternal ancestors are of European descent. There is no reported Ashkenazi Jewish ancestry. There is no known consanguinity.  GENETIC COUNSELING ASSESSMENT: Linda [name] is a 85 y.o. female with a personal and family history which is somewhat suggestive of a Hereditary Cancer Predisposition Syndrome. We, therefore, discussed and recommended the following at today's visit.   DISCUSSION: We reviewed the characteristics, features and inheritance patterns of hereditary cancer syndromes. We also discussed genetic testing, including the appropriate family members to test, the process of testing, insurance coverage and turn-around-time for results. We discussed the implications of a negative, positive and/or variant of uncertain significant result. We recommended Linda [name]  pursue genetic testing for the Common Hereditary Cancers gene panel + Renal Cancer panel + Myelodysplastic syndrome/Leukemia panel.    The Common Hereditary Cancer Panel offered by [facility] includes sequencing and/or deletion duplication testing of the following 53 genes: APC, ATM, AXIN2, BARD1, BMPR1A, BRCA1, BRCA2, BRIP1, BUB1B, CDH1, CDK4, CDKN2A, CHEK2, CTNNA1, DICER1, ENG, EPCAM, GALNT12, GREM1, HOXB13, KIT, MEN1, MLH1, MLH3, MSH2, MSH3, MSH6, MUTYH, NBN, NF1, NTHL1, PALB2, PDGFRA, PMS2, POLD1, POLE, PTEN, RAD50, RAD51C, RAD51D, RNF43, RPS20, SDHA, SDHB, SDHC, SDHD, SMAD4, SMARCA4, STK11, TP53, TSC1, TSC2, VHL  Renal/Urinary Tract Cancers Panel: BAP1 CDC73 CDKN1C DICER1 DIS3L2 EPCAM FH FLCN GPC3 MET MLH1 MSH2 MSH6 PMS2 PTEN REST SDHB SDHC SMARCA4 SMARCB1 TP53 TSC1 TSC2 VHL  WT1  Myelodysplastic Syndrome/Leukemia Panel:  ATM BLM CEBPA EPCAM GATA2 HRAS MLH1 MSH2 MSH6 NBN NF1 PMS2 RUNX1 TERC TERT TP53  We discussed that only 5-10% of cancers are associated with a Hereditary Cancer Predisposition Syndrome.  The most common hereditary cancer syndrome associated with colon cancer is Lynch Syndrome.  Lynch Syndrome is caused by mutations in the genes: MLH1, MSH2, MSH6, PMS2 and EPCAM.  This syndrome increases the risk for colon, uterine, ovarian and stomach cancers, as well as others.  Families with Lynch Syndrome tend to have multiple family members with these cancers, typically diagnosed under age 17, and diagnoses in multiple generations.    We also briefly discussed [place] Syndrome given the family history of uterine and kidney cancer and reports of family members with larger heads.   We discussed that there are several other genes that are associated with an increased risk for colon cancer and increased polyp burden (MUTYH, APC, POLE, CHEK2, etc.) We also dicussed that there are many genes that cause many different types of cancer risks.    We discussed that if she is found to have a mutation in one of these genes, it may impact future medical management recommendations such as increased cancer screenings and consideration of risk reducing surgeries.  A positive result could also have implications for the patient's family members.  A Negative result would mean we were unable to identify a hereditary component to her cancer, but does not rule out the possibility of a hereditary basis for her cancer.  There could be mutations that are undetectable by current technology, or in genes not yet tested or identified to increase cancer risk.    We discussed the potential to find a Variant of Uncertain Significance or VUS.  These are variants that have not yet been identified as pathogenic or benign, and it is unknown if this variant is associated with increased cancer risk or if  this is a normal finding.  Most VUS's are reclassified to benign or likely benign.   It should not be used to make medical management decisions. With time, we suspect the lab will determine the significance of any VUS's identified if any.   Based on Linda [name] personal and family history of cancer, she meets medical criteria for genetic testing. Despite that she meets criteria, she may still have an out of pocket cost. The laboratory can provide an estimate of her OOP cost.  hospital was provided the contact information for the laboratory if hospital has further questions.   PLAN: Despite our recommendation, Linda [name] did not wish to pursue genetic testing at today's visit. We understand this decision, and remain available to coordinate genetic testing at any time in the future. We; therefore, recommend Linda [name] continue to follow the cancer screening  guidelines given by her primary healthcare provider.  Based on Linda [name]'s family history, we recommended her sister, son with kidney cancer, and other relatives, also consider genetic counseling and testing. Linda [name] will let us know if we can be of any assistance in coordinating genetic counseling and/or testing for this family member.   Lastly, we encouraged Linda [name] to remain in contact with cancer genetics annually so that we can continuously update the family history and inform her of any changes in cancer genetics and testing that may be of benefit for this family.   Ms.  Lair questions were answered to her satisfaction today. Our contact information was provided should additional questions or concerns arise. Thank you for the referral and allowing [place] to share in the care of your patient.   Tana Felts, MS, [facility] Genetic Counselor [email or web address] phone: [phone]  The patient was seen for a total of 40 minutes in face-to-face genetic counseling. Dr.'s Magrinat, Burr Medico and Lindi Adie were available  for questions regarding this case.

## 2019-01-20 ENCOUNTER — Ambulatory Visit
Admission: RE | Admit: 2019-01-20 | Discharge: 2019-01-20 | Disposition: A | Discharge status: Home / Self Care | Payer: Medicare Other | Source: Ambulatory Visit | Attending: Internal Medicine | Admitting: Internal Medicine

## 2019-01-20 DIAGNOSIS — J181 Lobar pneumonia, unspecified organism: Secondary | ICD-10-CM | POA: Diagnosis not present

## 2019-01-26 ENCOUNTER — Encounter: Payer: Self-pay | Admitting: Internal Medicine

## 2019-02-10 DIAGNOSIS — R338 Other retention of urine: Secondary | ICD-10-CM | POA: Diagnosis not present

## 2019-02-10 DIAGNOSIS — N312 Flaccid neuropathic bladder, not elsewhere classified: Secondary | ICD-10-CM | POA: Diagnosis not present

## 2019-02-23 ENCOUNTER — Encounter: Payer: Self-pay | Admitting: Internal Medicine

## 2019-02-23 ENCOUNTER — Non-Acute Institutional Stay: Payer: Medicare Other | Admitting: Internal Medicine

## 2019-02-23 ENCOUNTER — Ambulatory Visit: Payer: Medicare Other | Admitting: Family Medicine

## 2019-02-23 VITALS — BP 122/70 | HR 65 | Temp 97.5°F

## 2019-02-23 DIAGNOSIS — M8000XA Age-related osteoporosis with current pathological fracture, unspecified site, initial encounter for fracture: Secondary | ICD-10-CM | POA: Insufficient documentation

## 2019-02-23 DIAGNOSIS — M8000XS Age-related osteoporosis with current pathological fracture, unspecified site, sequela: Secondary | ICD-10-CM | POA: Diagnosis not present

## 2019-02-23 DIAGNOSIS — J181 Lobar pneumonia, unspecified organism: Secondary | ICD-10-CM

## 2019-02-23 DIAGNOSIS — D692 Other nonthrombocytopenic purpura: Secondary | ICD-10-CM

## 2019-02-23 DIAGNOSIS — J189 Pneumonia, unspecified organism: Secondary | ICD-10-CM

## 2019-02-23 DIAGNOSIS — Z9109 Other allergy status, other than to drugs and biological substances: Secondary | ICD-10-CM | POA: Diagnosis not present

## 2019-02-23 NOTE — Progress Notes (Signed)
Location:  Occupational psychologist of Service:  Clinic (12)  Provider: Tino Ronan L. Mariea Clonts, D.O., C.M.D.  Code Status:  DNR Goals of Care:  Advanced Directives 12/22/2018  Does Patient Have a Medical Advance Directive? Yes  Type of Advance Directive [place]  Does patient want to make changes to medical advance directive? No - Patient declined  Copy of [place] in Chart? Yes - validated most recent copy scanned in chart (See row information)     Chief Complaint  Patient presents with  . Medical Management of Chronic Issues    50mth follow-up    HPI: Patient is a 85 y.o. female seen today for medical management of chronic diseases.    Wants to be sure her pneumonia is cleared up.  For xray in 3 mos.  Started coughing amid corona virus presentation yesterday.  Knows it's her allergies.  Takes her evening zyrtec with food.  Wonders if she should switch.  2-3 winters ago, she could not stop her nose from running.  Her sister had one of the others and it cleared it up.    She's back to water aerobics--just not kicking that right leg up the way she does the left after the hip fracture.  No pain though.  She is planning on getting her bone density, but it was scheduled a ways away when she arranged it.  Remains on vitamin D and doing her weightbearing.  Past Medical History:  Diagnosis Date  . Allergy   . Anxiety   . Arthritis   . Bursitis    right leg  . Family history of kidney cancer   . Family history of lung cancer   . Family history of uterine cancer   . GERD (gastroesophageal reflux disease)   . History of blood transfusion   . History of uterine cancer 01/19/2019  . Hyperlipidemia   . Hypertension   . Insomnia   . Non Hodgkin's lymphoma ([place]) 2017-2019   cured  . Osteopenia    MILD  . Urine retention   . Uterine cancer ([place])    at age 41    Past Surgical History:  Procedure Laterality Date  . ABDOMINAL  HYSTERECTOMY     AGE 56  . APPENDECTOMY     AGE 39  . BLADDER SURGERY    . Catarct Surgery    . ECTOPIC PREGNANCY SURGERY     age 40  . LAPAROSCOPIC SMALL BOWEL RESECTION    . NM PET DX LYMPHOMA  10/2018   IN REMISSION  . occular pressure    . OTHER SURGICAL HISTORY    . REPLACEMENT TOTAL KNEE BILATERAL    . TONSILLECTOMY AND ADENOIDECTOMY     age 68 or 15  . UTERINE CANCER SURGERY     age 29- located in vagina    No Known Allergies  Outpatient Encounter Medications as of 02/23/2019  Medication Sig  . acetaminophen (TYLENOL) 500 MG tablet Take 500 mg by mouth every 6 (six) hours as needed.  . bimatoprost (LUMIGAN) 0.01 % SOLN Place 1 drop into both eyes at bedtime.  . brimonidine (ALPHAGAN) 0.15 % ophthalmic solution Place 1 drop into both eyes 3 (three) times daily.  . cetirizine (ZYRTEC) 10 MG tablet Take 10 mg by mouth at bedtime.   . Cholecalciferol (VITAMIN D3) 50 MCG (2000 UT) CHEW Chew 1 capsule by mouth daily.  . eszopiclone (LUNESTA) 2 MG TABS tablet Take 1 tablet (  2 mg total) by mouth at bedtime. Take immediately before bedtime  . losartan (COZAAR) 50 MG tablet Take 1 tablet (50 mg total) by mouth daily.  . nitrofurantoin (MACRODANTIN) 50 MG capsule Take 1 capsule (50 mg total) by mouth daily.  . polyethylene glycol (MIRALAX / GLYCOLAX) packet Take 17 g by mouth every other day.  Marland Kitchen Propylene Glycol (SYSTANE BALANCE) 0.6 % SOLN Apply 1 drop to eye daily as needed.  . rosuvastatin (CRESTOR) 5 MG tablet Take 5 mg by mouth at bedtime.   Facility-Administered Encounter Medications as of 02/23/2019  Medication  . 0.9 %  sodium chloride infusion    Review of Systems:  Review of Systems  Constitutional: Negative for chills, fever and malaise/fatigue.  HENT: Positive for congestion and hearing loss. Negative for sinus pain and sore throat.        Hearing aids  Eyes:       Glasses  Respiratory: Positive for cough. Negative for shortness of breath.   Cardiovascular:  Negative for chest pain, palpitations and leg swelling.  Gastrointestinal: Negative for abdominal pain, constipation and diarrhea.  Genitourinary: Negative for dysuria.  Musculoskeletal: Negative for back pain, falls and joint pain.  Skin: Negative for itching and rash.  Neurological: Negative for dizziness and loss of consciousness.  Endo/Heme/Allergies: Positive for environmental allergies. Bruises/bleeds easily.       Three small bruises left arm  Psychiatric/Behavioral: Negative for depression and memory loss.    Health Maintenance  Topic Date Due  . INFLUENZA VACCINE  Completed  . DEXA SCAN  Completed  . TETANUS/TDAP  Discontinued  . PNA vac Low Risk Adult  Discontinued    Physical Exam: Vitals:   02/23/19 1327  BP: 122/70  Pulse: 65  Temp: (!) 97.5 F (36.4 C)  TempSrc: Oral  SpO2: 96%   There is no height or weight on file to calculate BMI. Physical Exam Vitals signs reviewed.  Constitutional:      Appearance: Normal appearance.  HENT:     Head: Normocephalic and atraumatic.  Cardiovascular:     Rate and Rhythm: Normal rate and regular rhythm.     Heart sounds: Normal heart sounds.  Pulmonary:     Effort: Pulmonary effort is normal.     Breath sounds: Normal breath sounds. No wheezing, rhonchi or rales.  Musculoskeletal: Normal range of motion.     Comments: Slow gait, but steady  Skin:    General: Skin is warm and dry.     Capillary Refill: Capillary refill takes less than 2 seconds.     Comments: Three small purple ecchymoses left forearm  Neurological:     General: No focal deficit present.     Mental Status: She is alert and oriented to person, place, and time.     Labs reviewed: Basic Metabolic Panel: Recent Labs    12/14/18 0316 12/15/18 0357 12/19/18 1700  NA 142 139 138  K 4.4 3.5 3.4*  CL 108 106 103  CO2 26 25 27   GLUCOSE 102* 93 100*  BUN 12 8 11   CREATININE 0.78 0.64 0.58  CALCIUM 8.8* 8.2* 9.0   Liver Function Tests: Recent  Labs    08/24/18 1111 12/14/18 0316 12/19/18 1700  AST 20 29 42*  ALT 14 17 26   ALKPHOS 121* 83 118  BILITOT 0.4 1.1 0.2*  PROT 6.7 6.0* 6.7  ALBUMIN 4.2 3.4* 3.5   Recent Labs    12/14/18 0316 12/19/18 1700  LIPASE 19 25   No  results for input(s): AMMONIA in the last 8760 hours. CBC: Recent Labs    12/14/18 0316 12/15/18 0357 12/19/18 1700  WBC 5.6 6.3 5.8  NEUTROABS 4.5 4.4 4.0  HGB 11.3* 10.1* 11.4*  HCT 38.0 32.6* 37.3  MCV 98.7 96.7 96.6  PLT 168 148* 229   Lipid Panel: Recent Labs    08/24/18 1111  CHOL 138  HDL 55.10  LDLCALC 59  TRIG 123.0  CHOLHDL 3   Assessment/Plan 1. Pneumonia of left lower lobe due to infectious organism [facility]) -lungs entirely clear on exam -does have a cough, but she attributes to nasal congestion and postnasal drip - will f/u cxr in May as we discussed previously to ensure complete resolution of the left lower lobe area that had persisted about a month after her initial pneumonia (?scarring though also) - DG Chest 2 View; Future  2. Senile purpura ([place]) -discussed with her today--has some on her left arm--educated about thin skin and loss of collagen and elastin causing easier bruising  3. Osteoporosis with pathological fracture, sequela -for bone density -cont weightbearing exercise and vitamin D therapy -we'll review bone density at next visit  4.  Environmental allergies -change temporarily to claritin or allegra instead of zyrtec to see if symptoms clear up as they did when she made a change before  Labs/tests ordered:   Orders Placed This Encounter  Procedures  . DG Chest 2 View    Standing Status:   Future    Standing Expiration Date:   04/24/2020    Order Specific Question:   Reason for Exam (SYMPTOM  OR DIAGNOSIS REQUIRED)    Answer:   Subtle peribronchial airspace consolidation the left lower lobe    Order Specific Question:   Preferred imaging location?    Answer:   [facility]   Next appt:   06/01/2019   Rayne Cowdrey L. Benno Brensinger, D.O. [facility] [address],  [address] Cell Phone (Mon-Fri 8am-5pm):  [phone] On Call:  [phone] & follow prompts after 5pm & weekends Office Phone:  [phone] Office Fax:  [phone]

## 2019-02-24 ENCOUNTER — Telehealth: Payer: Self-pay | Admitting: *Deleted

## 2019-02-24 NOTE — Telephone Encounter (Signed)
Received Prior Authorization from [employer] for [place]. Form placed in Dr. Cyndi Lennert folder for approval. To be faxed to [phone] once completed.

## 2019-03-02 NOTE — Telephone Encounter (Signed)
Received fax from [facility] stating Linda [name] was APPROVED 01/27/19-02/26/20.

## 2019-03-29 ENCOUNTER — Other Ambulatory Visit: Payer: Medicare Other

## 2019-04-25 ENCOUNTER — Other Ambulatory Visit: Payer: Self-pay | Admitting: Internal Medicine

## 2019-04-25 DIAGNOSIS — G4700 Insomnia, unspecified: Secondary | ICD-10-CM

## 2019-04-25 NOTE — Telephone Encounter (Signed)
Last filled 03/05/19, needs to be sent to [facility] checked and verified

## 2019-05-16 ENCOUNTER — Telehealth: Payer: Self-pay | Admitting: *Deleted

## 2019-05-16 DIAGNOSIS — G4700 Insomnia, unspecified: Secondary | ICD-10-CM

## 2019-05-16 NOTE — Telephone Encounter (Signed)
Please call her and let her know.  I am aware she is on this particular prescription b/c she's said it's the only one that's helped her to sleep.  If she'd like to try an alternative, I am happy to prescribe it in the interim--examples would be belsomra or ambien, over the counter melatonin.

## 2019-05-16 NOTE — Telephone Encounter (Signed)
Received fax from [place] [phone] stating that "Eszopiclone 2mg  is Mfr Backordered. Please provide a new Rx for an alternate medication." Fax ID: [phone] Reference #: [id number]  Please Advise.

## 2019-05-17 MED ORDER — LUNESTA 2 MG PO TABS: ORAL_TABLET | ORAL | 1 refills | Status: DC

## 2019-05-17 NOTE — Telephone Encounter (Signed)
Let's try the brand name lunesta, same dose as her generic she took.  Hopefully, it's affordable.

## 2019-05-17 NOTE — Telephone Encounter (Signed)
Notified patient. She wanted to know if:  1. Brand name [name] can be sent to pharmacy instead. Or  2. Ambien can be called in. Patient stated that she was on this in the past but was stopped because insurance would not cover.   Please Advise.

## 2019-05-17 NOTE — Telephone Encounter (Signed)
[place] Verified LR: 03/05/2019 with #[phone]  Pended Rx and sent to Dr. Mariea Clonts to send to Mail order.

## 2019-05-31 ENCOUNTER — Other Ambulatory Visit: Payer: Medicare Other

## 2019-06-01 ENCOUNTER — Encounter: Payer: Medicare Other | Admitting: Internal Medicine

## 2019-06-30 DIAGNOSIS — Z961 Presence of intraocular lens: Secondary | ICD-10-CM | POA: Diagnosis not present

## 2019-06-30 DIAGNOSIS — H401131 Primary open-angle glaucoma, bilateral, mild stage: Secondary | ICD-10-CM | POA: Diagnosis not present

## 2019-07-13 ENCOUNTER — Other Ambulatory Visit: Payer: Self-pay

## 2019-07-13 ENCOUNTER — Non-Acute Institutional Stay: Payer: Medicare Other | Admitting: Internal Medicine

## 2019-07-13 ENCOUNTER — Encounter: Payer: Self-pay | Admitting: Internal Medicine

## 2019-07-13 VITALS — BP 118/70 | HR 57 | Temp 98.3°F

## 2019-07-13 DIAGNOSIS — Z9109 Other allergy status, other than to drugs and biological substances: Secondary | ICD-10-CM | POA: Diagnosis not present

## 2019-07-13 DIAGNOSIS — M8000XS Age-related osteoporosis with current pathological fracture, unspecified site, sequela: Secondary | ICD-10-CM | POA: Diagnosis not present

## 2019-07-13 DIAGNOSIS — I872 Venous insufficiency (chronic) (peripheral): Secondary | ICD-10-CM

## 2019-07-13 DIAGNOSIS — K5909 Other constipation: Secondary | ICD-10-CM | POA: Diagnosis not present

## 2019-07-13 NOTE — Progress Notes (Signed)
Location:  Occupational psychologist of Service:  Clinic (12)  Provider: Tynell Winchell L. Mariea Clonts, D.O., C.M.D.  Code Status: DNR Goals of Care:  Advanced Directives 12/22/2018  Does Patient Have a Medical Advance Directive? Yes  Type of Advance Directive [place]  Does patient want to make changes to medical advance directive? No - Patient declined  Copy of [place] in Chart? Yes - validated most recent copy scanned in chart (See row information)     Chief Complaint  Patient presents with  . Medical Management of Chronic Issues    48mth follow-up    HPI: Patient is a 85 y.o. female seen today for medical management of chronic diseases.    She is good overall.  She had pushed her appt forward b/c of covid.  She is taking her vitamin D3 for her bones.  She is no longer using miralax.  She is taking 2 senna-plus.  It is working for her.  Has to take every other to every third day.  No diarrhea or constipation to speak of on this.  She had been having some new allergy symptoms--we switched her allergy med from generic zyrtec to generic claritin.  Magically, the drippy nose and coughing stopped.  Then she switched back and she's been good for a while.  She's out more with the mask on and nose is starting to drip and eyes are watering back.  She's thinking she'll switch back again.    Has two new things: She walked over here.  Her ankles are no longer swollen after walking.  She had noticed the swelling more b/c of wearing her sandals in the heat.  Encouraged more walking.  Has been doing 6 wks of 3x/wk aerobic exercise.  Pool now open and she's doing water aerobics again there.    She's not seen derm in probably 2-3 years.  She was seen here for a "blister-like thing on her eyelid" and saw the NP and there were not tools here to do anything with it--so she was to go to the office.  She had made an appt which she had to delay b/c of covid  and now it's 9 months out.    Past Medical History:  Diagnosis Date  . Allergy   . Anxiety   . Arthritis   . Bursitis    right leg  . Family history of kidney cancer   . Family history of lung cancer   . Family history of uterine cancer   . GERD (gastroesophageal reflux disease)   . History of blood transfusion   . History of uterine cancer 01/19/2019  . Hyperlipidemia   . Hypertension   . Insomnia   . Non Hodgkin's lymphoma ([place]) 2017-2019   cured  . Osteopenia    MILD  . Urine retention   . Uterine cancer ([place])    at age 37    Past Surgical History:  Procedure Laterality Date  . ABDOMINAL HYSTERECTOMY     AGE 66  . APPENDECTOMY     AGE 45  . BLADDER SURGERY    . Catarct Surgery    . ECTOPIC PREGNANCY SURGERY     age 50  . LAPAROSCOPIC SMALL BOWEL RESECTION    . NM PET DX LYMPHOMA  10/2018   IN REMISSION  . occular pressure    . OTHER SURGICAL HISTORY    . REPLACEMENT TOTAL KNEE BILATERAL    . TONSILLECTOMY AND ADENOIDECTOMY  age 64 or 54  . UTERINE CANCER SURGERY     age 39- located in vagina    No Known Allergies  Outpatient Encounter Medications as of 07/13/2019  Medication Sig  . acetaminophen (TYLENOL) 500 MG tablet Take 500 mg by mouth every 6 (six) hours as needed.  . bimatoprost (LUMIGAN) 0.01 % SOLN Place 1 drop into both eyes at bedtime.  . brimonidine (ALPHAGAN) 0.15 % ophthalmic solution Place 1 drop into both eyes 3 (three) times daily.  . cetirizine (ZYRTEC) 10 MG tablet Take 10 mg by mouth at bedtime.   . Cholecalciferol (VITAMIN D3) 50 MCG (2000 UT) CHEW Chew 1 capsule by mouth daily.  Marland Kitchen losartan (COZAAR) 50 MG tablet Take 1 tablet (50 mg total) by mouth daily.  [name] 2 MG TABS tablet Take one tablet by mouth at bedtime for sleep; Take immediately before bedtime  . nitrofurantoin (MACRODANTIN) 50 MG capsule Take 1 capsule (50 mg total) by mouth daily.  Marland Kitchen Propylene Glycol (SYSTANE BALANCE) 0.6 % SOLN Apply 1 drop to eye daily as needed.   . rosuvastatin (CRESTOR) 5 MG tablet Take 5 mg by mouth at bedtime.  . senna-docusate (SENOKOT-S) 8.6-50 MG tablet Take 1 tablet by mouth as needed for mild constipation.  . [DISCONTINUED] polyethylene glycol (MIRALAX / GLYCOLAX) packet Take 17 g by mouth every other day.   Facility-Administered Encounter Medications as of 07/13/2019  Medication  . 0.9 %  sodium chloride infusion    Review of Systems:  Review of Systems  Constitutional: Negative for chills, fever and malaise/fatigue.  HENT: Positive for congestion and hearing loss. Negative for sinus pain and sore throat.   Eyes:       Glasses  Respiratory: Negative for cough and shortness of breath.   Cardiovascular: Positive for leg swelling. Negative for chest pain, palpitations, orthopnea and PND.  Gastrointestinal: Negative for abdominal pain, blood in stool, constipation, diarrhea, heartburn, melena, nausea and vomiting.  Genitourinary: Negative for dysuria.       Incomplete bladder emptying  Musculoskeletal: Negative for back pain, falls and joint pain.  Skin: Negative for itching and rash.  Neurological: Negative for dizziness and loss of consciousness.  Endo/Heme/Allergies: Positive for environmental allergies. Does not bruise/bleed easily.  Psychiatric/Behavioral: Negative for depression and memory loss. The patient is not nervous/anxious and does not have insomnia.     Health Maintenance  Topic Date Due  . INFLUENZA VACCINE  07/16/2019  . DEXA SCAN  Completed  . TETANUS/TDAP  Discontinued  . PNA vac Low Risk Adult  Discontinued    Physical Exam: Vitals:   07/13/19 0935  BP: 118/70  Pulse: (!) 57  Temp: 98.3 F (36.8 C)  TempSrc: Oral  SpO2: 96%   There is no height or weight on file to calculate BMI. Physical Exam Vitals signs reviewed.  Constitutional:      General: She is not in acute distress.    Appearance: Normal appearance. She is not ill-appearing or toxic-appearing.  HENT:     Head: Normocephalic  and atraumatic.     Ears:     Comments: Hearing aids Eyes:     Comments: glasses  Cardiovascular:     Rate and Rhythm: Normal rate and regular rhythm.     Pulses: Normal pulses.     Heart sounds: Normal heart sounds.  Pulmonary:     Effort: Pulmonary effort is normal.     Breath sounds: Normal breath sounds. No wheezing, rhonchi or rales.  Abdominal:  General: Bowel sounds are normal.  Musculoskeletal: Normal range of motion.     Right lower leg: No edema.     Left lower leg: No edema.  Skin:    General: Skin is warm and dry.  Neurological:     General: No focal deficit present.     Mental Status: She is alert and oriented to person, place, and time.     Cranial Nerves: No cranial nerve deficit.     Motor: No weakness.  Psychiatric:        Mood and Affect: Mood normal.        Behavior: Behavior normal.        Thought Content: Thought content normal.        Judgment: Judgment normal.     Labs reviewed: Basic Metabolic Panel: Recent Labs    12/14/18 0316 12/15/18 0357 12/19/18 1700  NA 142 139 138  K 4.4 3.5 3.4*  CL 108 106 103  CO2 26 25 27   GLUCOSE 102* 93 100*  BUN 12 8 11   CREATININE 0.78 0.64 0.58  CALCIUM 8.8* 8.2* 9.0   Liver Function Tests: Recent Labs    08/24/18 1111 12/14/18 0316 12/19/18 1700  AST 20 29 42*  ALT 14 17 26   ALKPHOS 121* 83 118  BILITOT 0.4 1.1 0.2*  PROT 6.7 6.0* 6.7  ALBUMIN 4.2 3.4* 3.5   Recent Labs    12/14/18 0316 12/19/18 1700  LIPASE 19 25   No results for input(s): AMMONIA in the last 8760 hours. CBC: Recent Labs    12/14/18 0316 12/15/18 0357 12/19/18 1700  WBC 5.6 6.3 5.8  NEUTROABS 4.5 4.4 4.0  HGB 11.3* 10.1* 11.4*  HCT 38.0 32.6* 37.3  MCV 98.7 96.7 96.6  PLT 168 148* 229   Lipid Panel: Recent Labs    08/24/18 1111  CHOL 138  HDL 55.10  LDLCALC 59  TRIG 123.0  CHOLHDL 3   Assessment/Plan 1. Venous insufficiency of both lower extremities -mild and intermittent for her -educated on  avoiding adding salt and eating foods with high sodium -elevate feet at rest -improves with exercise and she's noticed this and plans to continue her exercise program  2. Osteoporosis with pathological fracture, sequela -with right hip fx  -no symptoms from fx at present--takes it easy with exercising involving that hip -continues on vitmain D therapy--did not choose to pursue prolia or other antiresorptive or biologic  3. Chronic constipation with overflow incontinence -cont current stool softener use and keep miralax in case of severe episode, hydrate and stay active  4. Environmental allergies -she's planning to switch over the claritin again b/c her drippy nose has come back on zyrtec here lately  Labs/tests ordered:  Cbc with diff, cmp, flp before Next appt:  6 mos EV  Linda [name], D.O. [facility] [address], [place] [address] Cell Phone (Mon-Fri 8am-5pm):  [phone] On Call:  [phone] & follow prompts after 5pm & weekends Office Phone:  [phone] Office Fax:  [phone]

## 2019-07-15 DIAGNOSIS — I872 Venous insufficiency (chronic) (peripheral): Secondary | ICD-10-CM | POA: Insufficient documentation

## 2019-07-22 ENCOUNTER — Telehealth: Payer: Self-pay | Admitting: *Deleted

## 2019-07-22 DIAGNOSIS — Z20828 Contact with and (suspected) exposure to other viral communicable diseases: Secondary | ICD-10-CM

## 2019-07-22 DIAGNOSIS — Z20822 Contact with and (suspected) exposure to covid-19: Secondary | ICD-10-CM

## 2019-07-22 NOTE — Telephone Encounter (Signed)
Patient notified and agreed.  

## 2019-07-22 NOTE — Telephone Encounter (Signed)
Patient called and stated that she is planning on going to the beach on 08/04/19 thru 08/07/19. She stated that she has to be tested for Covid when she comes back so [facility] doesn't Quarantine her for 14 days. Patient is wanting to know if you will place an order so she can be tested that Sunday when she returns because [facility] will not let her back out to go get tested. Please Advise.

## 2019-07-22 NOTE — Telephone Encounter (Signed)
The testing center is only open Monday through Friday.  She will need to go the Monday she returns and stay quarantined otherwise until she gets her results.  I will place the future order today.

## 2019-07-25 ENCOUNTER — Other Ambulatory Visit: Payer: Self-pay

## 2019-07-28 ENCOUNTER — Other Ambulatory Visit: Payer: Medicare Other

## 2019-07-29 ENCOUNTER — Other Ambulatory Visit: Payer: Self-pay | Admitting: *Deleted

## 2019-07-29 DIAGNOSIS — G4700 Insomnia, unspecified: Secondary | ICD-10-CM

## 2019-07-29 MED ORDER — LUNESTA 2 MG PO TABS: ORAL_TABLET | ORAL | 0 refills | Status: DC

## 2019-07-29 NOTE — Telephone Encounter (Signed)
[facility] Pended Rx and sent to Dr. Mariea Clonts for approval.  NCCSRS Database Verified LR: 05/17/2019

## 2019-08-08 ENCOUNTER — Other Ambulatory Visit: Payer: Self-pay

## 2019-08-08 DIAGNOSIS — Z20822 Contact with and (suspected) exposure to covid-19: Secondary | ICD-10-CM

## 2019-08-08 DIAGNOSIS — R6889 Other general symptoms and signs: Secondary | ICD-10-CM | POA: Diagnosis not present

## 2019-08-09 LAB — NOVEL CORONAVIRUS, NAA: SARS-CoV-2, NAA: NOT DETECTED

## 2019-09-02 ENCOUNTER — Other Ambulatory Visit: Payer: Self-pay | Admitting: Internal Medicine

## 2019-09-02 DIAGNOSIS — I10 Essential (primary) hypertension: Secondary | ICD-10-CM

## 2019-09-02 DIAGNOSIS — G4700 Insomnia, unspecified: Secondary | ICD-10-CM

## 2019-09-02 NOTE — Telephone Encounter (Signed)
Patient requesting refill for lunesta, last received refill on 07/29/2019 for  90 tablets no refills and losartan last received refill on 10/20/18 for 90 tabs 3 refills. Patient still has refill. Routing to provider for further review.

## 2019-09-02 NOTE — Telephone Encounter (Signed)
Pt had gotten a different brand of medication temporarily due to a back order situation so I think that's why she is requesting the lunesta early.  I have approved them.

## 2019-09-13 DIAGNOSIS — C44719 Basal cell carcinoma of skin of left lower limb, including hip: Secondary | ICD-10-CM | POA: Diagnosis not present

## 2019-09-13 DIAGNOSIS — D0422 Carcinoma in situ of skin of left ear and external auricular canal: Secondary | ICD-10-CM | POA: Diagnosis not present

## 2019-09-13 DIAGNOSIS — L918 Other hypertrophic disorders of the skin: Secondary | ICD-10-CM | POA: Diagnosis not present

## 2019-09-13 DIAGNOSIS — L821 Other seborrheic keratosis: Secondary | ICD-10-CM | POA: Diagnosis not present

## 2019-09-13 DIAGNOSIS — Z85828 Personal history of other malignant neoplasm of skin: Secondary | ICD-10-CM | POA: Diagnosis not present

## 2019-09-13 DIAGNOSIS — L570 Actinic keratosis: Secondary | ICD-10-CM | POA: Diagnosis not present

## 2019-09-13 DIAGNOSIS — L7212 Trichodermal cyst: Secondary | ICD-10-CM | POA: Diagnosis not present

## 2019-09-13 DIAGNOSIS — L820 Inflamed seborrheic keratosis: Secondary | ICD-10-CM | POA: Diagnosis not present

## 2019-09-20 ENCOUNTER — Ambulatory Visit (INDEPENDENT_AMBULATORY_CARE_PROVIDER_SITE_OTHER): Payer: Medicare Other

## 2019-09-20 ENCOUNTER — Ambulatory Visit (INDEPENDENT_AMBULATORY_CARE_PROVIDER_SITE_OTHER): Payer: Medicare Other | Admitting: Orthopaedic Surgery

## 2019-09-20 ENCOUNTER — Encounter: Payer: Self-pay | Admitting: Orthopaedic Surgery

## 2019-09-20 DIAGNOSIS — M25551 Pain in right hip: Secondary | ICD-10-CM

## 2019-09-20 DIAGNOSIS — M545 Low back pain: Secondary | ICD-10-CM | POA: Diagnosis not present

## 2019-09-20 DIAGNOSIS — G8929 Other chronic pain: Secondary | ICD-10-CM

## 2019-09-20 NOTE — Progress Notes (Signed)
Office Visit Note   Patient: Linda [name] [id number]           Date of Birth: Sep 30, 1932           MRN: [id number] Visit Date: 09/20/2019              Requested by: Linda Curry, DO [address] [place],  [place] [address] PCP: Linda Curry, DO   Assessment & Plan: Visit Diagnoses:  1. Right hip pain   2. Chronic low back pain, unspecified back pain laterality, unspecified whether sciatica present     Plan: Impression is recurrent low back pain and radiculopathy.  We will send her back to Dr. Ernestina [name] for repeat lumbar spine ESI.  Questions encouraged and answered.  Follow-up as needed.  Follow-Up Instructions: Return if symptoms worsen or fail to improve.   Orders:  Orders Placed This Encounter  Procedures  . XR HIP UNILAT W OR W/O PELVIS 2-3 VIEWS RIGHT  . XR Lumbar Spine 2-3 Views  . Ambulatory referral to Physical Medicine Rehab   No orders of the defined types were placed in this encounter.     Procedures: No procedures performed   Clinical Data: No additional findings.   Subjective: Chief Complaint  Patient presents with  . Lower Back - Pain  . Right Hip - Pain    Linda [name] is a 85 year old female who comes in for evaluation of low back pain.  This has been a chronic issue that she has been dealing with and she has gotten good relief from prior back injections with Dr. Ernestina [name].  She feels like this is starting to flareup again but worse on the left side.  She is taking gabapentin.  Denies any groin pain.   Review of Systems  Constitutional: Negative.   HENT: Negative.   Eyes: Negative.   Respiratory: Negative.   Cardiovascular: Negative.   Endocrine: Negative.   Musculoskeletal: Negative.   Neurological: Negative.   Hematological: Negative.   Psychiatric/Behavioral: Negative.   All other systems reviewed and are negative.    Objective: Vital Signs: There were no vitals taken for this visit.  Physical Exam Vitals signs and nursing note reviewed.   Constitutional:      Appearance: She is well-developed.  Pulmonary:     Effort: Pulmonary effort is normal.  Skin:    General: Skin is warm.     Capillary Refill: Capillary refill takes less than 2 seconds.  Neurological:     Mental Status: She is alert and oriented to person, place, and time.  Psychiatric:        Behavior: Behavior normal.        Thought Content: Thought content normal.        Judgment: Judgment normal.     Ortho Exam Bilateral hip exams are unremarkable.  Lumbar spine exam shows tenderness along the lumbar spine.  No focal motor or sensory deficits.  Specialty Comments:  No specialty comments available.  Imaging: Xr Hip Unilat W Or W/o Pelvis 2-3 Views Right  Result Date: 09/20/2019 Right hip DJD.  No acute abnormalities  Xr Lumbar Spine 2-3 Views  Result Date: 09/20/2019 Anterolisthesis L4-5.  No acute abnormalities.    PMFS History: Patient Active Problem List   Diagnosis Date Noted  . Venous insufficiency of both lower extremities 07/15/2019  . Environmental allergies 02/23/2019  . Osteoporosis with pathological fracture 02/23/2019  . Senile purpura ([place]) 02/23/2019  . History of uterine cancer 01/19/2019  . Family history of  uterine cancer   . Family history of kidney cancer   . Family history of lung cancer   . Fracture of right acetabulum ([place]) 01/11/2019  . Chronic constipation with overflow incontinence 12/31/2018  . Physical deconditioning 12/31/2018  . Spinal stenosis of lumbar region without neurogenic claudication 12/03/2018  . Lumbar foraminal stenosis 12/03/2018  . 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 Seasonal allergies 10/20/2018  . Trochanteric bursitis, right hip 09/14/2018  . Chronic bilateral low back pain 09/14/2018  . Hoarseness of voice 04/08/2018  . Hypertension, essential, benign 01/16/2018  . Sensorineural hearing loss (SNHL) of both ears 01/16/2018  . Insomnia 01/16/2018  . MALT lymphoma ([place]) 01/15/2018  . H/O  Cancer of vaginal vault ([place]) 01/15/2018  . Osteopenia of both hips 01/15/2018  . Hyperlipidemia 01/15/2018  . DJD (degenerative joint disease) of knee 02/13/2011  . H/O difficult intubation 02/12/2011  . Dry eye syndrome 04/10/2010  . Incomplete emptying of bladder 08/31/2009  . Borderline glaucoma with ocular hypertension 09/07/2008   Past Medical History:  Diagnosis Date  . Allergy   . Anxiety   . Arthritis   . Bursitis    right leg  . Family history of kidney cancer   . Family history of lung cancer   . Family history of uterine cancer   . GERD (gastroesophageal reflux disease)   . History of blood transfusion   . History of uterine cancer 01/19/2019  . Hyperlipidemia   . Hypertension   . Insomnia   . Non Hodgkin's lymphoma ([place]) 2017-2019   cured  . Osteopenia    MILD  . Urine retention   . Uterine cancer ([place])    at age 74    Family History  Problem Relation Age of Onset  . Uterine cancer Sister 53       uterine cancer  . COPD Maternal Grandfather   . Lung cancer Maternal Grandfather   . Heart failure Mother 105  . Pneumonia Father 48  . Dementia Father   . Uterine cancer Maternal Grandmother 62  . Other Sister        Guillain Barre syndrom  . Kidney cancer Son 31  . Dementia Paternal Grandmother   . Lung cancer Maternal Uncle   . Other Son        Familial Hypercholesterolemia  . Colon cancer Neg Hx   . Esophageal cancer Neg Hx   . Liver cancer Neg Hx   . Pancreatic cancer Neg Hx   . Rectal cancer Neg Hx   . Stomach cancer Neg Hx     Past Surgical History:  Procedure Laterality Date  . ABDOMINAL HYSTERECTOMY     AGE 54  . APPENDECTOMY     AGE 73  . BLADDER SURGERY    . Catarct Surgery    . ECTOPIC PREGNANCY SURGERY     age 23  . LAPAROSCOPIC SMALL BOWEL RESECTION    . NM PET DX LYMPHOMA  10/2018   IN REMISSION  . occular pressure    . OTHER SURGICAL HISTORY    . REPLACEMENT TOTAL KNEE BILATERAL    . TONSILLECTOMY AND ADENOIDECTOMY     age 75  or 8  . UTERINE CANCER SURGERY     age 73- located in vagina   Social History   Occupational History  . Occupation: retired  Tobacco Use  . Smoking status: Never Smoker  . Smokeless tobacco: Never Used  Substance and Sexual Activity  . Alcohol use: Yes  Alcohol/week: 7.0 standard drinks    Types: 7 Glasses of wine per week    Comment: one glass of wine with dinner  . Drug use: No  . Sexual activity: Never

## 2019-10-03 DIAGNOSIS — Z85828 Personal history of other malignant neoplasm of skin: Secondary | ICD-10-CM | POA: Diagnosis not present

## 2019-10-03 DIAGNOSIS — C44229 Squamous cell carcinoma of skin of left ear and external auricular canal: Secondary | ICD-10-CM | POA: Diagnosis not present

## 2019-10-03 DIAGNOSIS — C44719 Basal cell carcinoma of skin of left lower limb, including hip: Secondary | ICD-10-CM | POA: Diagnosis not present

## 2019-10-04 ENCOUNTER — Ambulatory Visit
Admission: RE | Admit: 2019-10-04 | Discharge: 2019-10-04 | Disposition: A | Discharge status: Home / Self Care | Payer: Medicare Other | Source: Ambulatory Visit | Attending: Internal Medicine | Admitting: Internal Medicine

## 2019-10-04 ENCOUNTER — Other Ambulatory Visit: Payer: Self-pay

## 2019-10-04 DIAGNOSIS — Z780 Asymptomatic menopausal state: Secondary | ICD-10-CM | POA: Diagnosis not present

## 2019-10-04 DIAGNOSIS — M8000XS Age-related osteoporosis with current pathological fracture, unspecified site, sequela: Secondary | ICD-10-CM

## 2019-10-04 DIAGNOSIS — M85851 Other specified disorders of bone density and structure, right thigh: Secondary | ICD-10-CM | POA: Diagnosis not present

## 2019-10-04 DIAGNOSIS — M810 Age-related osteoporosis without current pathological fracture: Secondary | ICD-10-CM

## 2019-10-11 ENCOUNTER — Other Ambulatory Visit: Payer: Self-pay | Admitting: *Deleted

## 2019-10-11 DIAGNOSIS — G4700 Insomnia, unspecified: Secondary | ICD-10-CM

## 2019-10-11 MED ORDER — LUNESTA 2 MG PO TABS: ORAL_TABLET | ORAL | 3 refills | Status: DC

## 2019-10-11 NOTE — Telephone Encounter (Signed)
[facility] Pended Rx and sent to Dr. Mariea Clonts for approval.

## 2019-10-17 ENCOUNTER — Ambulatory Visit (INDEPENDENT_AMBULATORY_CARE_PROVIDER_SITE_OTHER): Payer: Medicare Other | Admitting: Physical Medicine and Rehabilitation

## 2019-10-17 ENCOUNTER — Encounter: Payer: Self-pay | Admitting: Physical Medicine and Rehabilitation

## 2019-10-17 ENCOUNTER — Ambulatory Visit: Payer: Self-pay

## 2019-10-17 VITALS — BP 143/67 | HR 60

## 2019-10-17 DIAGNOSIS — M5416 Radiculopathy, lumbar region: Secondary | ICD-10-CM | POA: Diagnosis not present

## 2019-10-17 MED ORDER — BETAMETHASONE SOD PHOS & ACET 6 (3-3) MG/ML IJ SUSP: 12.0000 mg | Freq: Once | INTRAMUSCULAR | Status: DC

## 2019-10-17 NOTE — Progress Notes (Signed)
 .  Numeric Pain Rating Scale and Functional Assessment Average Pain 5   In the last MONTH (on 0-10 scale) has pain interfered with the following?  1. General activity like being  able to carry out your everyday physical activities such as walking, climbing stairs, carrying groceries, or moving a chair?  Rating(6)   +Driver, -BT, -Dye Allergies.  

## 2019-10-19 ENCOUNTER — Ambulatory Visit (INDEPENDENT_AMBULATORY_CARE_PROVIDER_SITE_OTHER): Payer: Medicare Other | Admitting: Family

## 2019-10-19 ENCOUNTER — Other Ambulatory Visit: Payer: Self-pay

## 2019-10-19 ENCOUNTER — Encounter: Payer: Self-pay | Admitting: Family

## 2019-10-19 DIAGNOSIS — Z0000 Encounter for general adult medical examination without abnormal findings: Secondary | ICD-10-CM | POA: Diagnosis not present

## 2019-10-19 NOTE — Progress Notes (Signed)
Subjective:   Linda [name] is a 85 y.o. female who presents for Medicare Annual (Subsequent) preventive examination.  Review of Systems:   Cardiac Risk Factors include: advanced age (>8men, >83 women);dyslipidemia;obesity (BMI >30kg/m2)     Objective:     Vitals: There were no vitals taken for this visit.  There is no height or weight on file to calculate BMI.  Advanced Directives 12/22/2018 12/19/2018 12/14/2018 12/14/2018 10/20/2018 07/06/2018 05/24/2018  Does Patient Have a Medical Advance Directive? Yes Yes Yes Yes Yes No;Yes Yes  Type of [employer] [place];Living will [place];Living will Healthcare Power of [place];Living will [place];Living will  Does patient want to make changes to medical advance directive? No - Patient declined - No - Patient declined - No - Patient declined - No - Patient declined  Copy of [place] in Chart? Yes - validated most recent copy scanned in chart (See row information) - - Yes - validated most recent copy scanned in chart (See row information) Yes - validated most recent copy scanned in chart (See row information) No - copy requested No - copy requested    Tobacco Social History   Tobacco Use  Smoking Status Never Smoker  Smokeless Tobacco Never Used     Counseling given: Not Answered   Clinical Intake:  Pre-visit preparation completed: No  Pain : 0-10 Pain Score: 0-No pain Pain Type: Chronic pain Pain Location: Back Pain Orientation: Lower Pain Radiating Towards: No Pain Descriptors / Indicators: Aching Pain Onset: Other (comment)(More than one year) Pain Frequency: Constant Pain Relieving Factors: cortisol injection Effect of Pain on Daily Activities: No  Pain Relieving Factors: cortisol injection  BMI - recorded: 34.33 Nutritional Status: BMI > 30  Obese Nutritional Risks: None  Diabetes: No  How often do you need to have someone help you when you read instructions, pamphlets, or other written materials from your doctor or pharmacy?: 1 - Never What is the last grade level you completed in school?: Masters Degree  Interpreter Needed?: No  Information entered by :: Arshiya Jakes FNP-C  Past Medical History:  Diagnosis Date  . Allergy   . Anxiety   . Arthritis   . Bursitis    right leg  . Family history of kidney cancer   . Family history of lung cancer   . Family history of uterine cancer   . GERD (gastroesophageal reflux disease)   . History of blood transfusion   . History of uterine cancer 01/19/2019  . Hyperlipidemia   . Hypertension   . Insomnia   . Non Hodgkin's lymphoma ([place]) 2017-2019   cured  . Osteopenia    MILD  . Urine retention   . Uterine cancer ([place])    at age 39   Past Surgical History:  Procedure Laterality Date  . ABDOMINAL HYSTERECTOMY     AGE 23  . APPENDECTOMY     AGE 41  . BLADDER SURGERY    . Catarct Surgery    . ECTOPIC PREGNANCY SURGERY     age 58  . LAPAROSCOPIC SMALL BOWEL RESECTION    . NM PET DX LYMPHOMA  10/2018   IN REMISSION  . occular pressure    . OTHER SURGICAL HISTORY    . REPLACEMENT TOTAL KNEE BILATERAL    . TONSILLECTOMY AND ADENOIDECTOMY     age 85 or 66  . UTERINE CANCER SURGERY  age 79- located in vagina   Family History  Problem Relation Age of Onset  . Uterine cancer Sister 58       uterine cancer  . COPD Maternal Grandfather   . Lung cancer Maternal Grandfather   . Heart failure Mother 74  . Pneumonia Father 33  . Dementia Father   . Uterine cancer Maternal Grandmother 62  . Other Sister        Guillain Barre syndrom  . Kidney cancer Son 37  . Dementia Paternal Grandmother   . Lung cancer Maternal Uncle   . Other Son        Familial Hypercholesterolemia  . Colon cancer Neg Hx   . Esophageal cancer Neg Hx   . Liver cancer Neg Hx   . Pancreatic cancer Neg Hx   . Rectal cancer  Neg Hx   . Stomach cancer Neg Hx    Social History   Socioeconomic History  . Marital status: Single    Spouse name: Not on file  . Number of children: Not on file  . Years of education: Not on file  . Highest education level: Not on file  Occupational History  . Occupation: retired  Scientific laboratory technician  . Financial resource strain: Not on file  . Food insecurity    Worry: Not on file    Inability: Not on file  . Transportation needs    Medical: Not on file    Non-medical: Not on file  Tobacco Use  . Smoking status: Never Smoker  . Smokeless tobacco: Never Used  Substance and Sexual Activity  . Alcohol use: Yes    Alcohol/week: 7.0 standard drinks    Types: 7 Glasses of wine per week    Comment: one glass of wine with dinner  . Drug use: No  . Sexual activity: Never  Lifestyle  . Physical activity    Days per week: Not on file    Minutes per session: Not on file  . Stress: Not on file  Relationships  . Social Herbalist on phone: Not on file    Gets together: Not on file    Attends religious service: Not on file    Active member of club or organization: Not on file    Attends meetings of clubs or organizations: Not on file    Relationship status: Not on file  Other Topics Concern  . Not on file  Social History Narrative   Social History      Diet? Low to no fiber diet; lactose (no tolerance), low or no soy      Do you drink/eat things with caffeine? No liquid caffeine, occasional chocolate      Marital status?                divorced                    What year were you married? n/a      Do you live in a house, apartment, assisted living, condo, trailer, etc.? Apartment/ retirement      Is it one or more stories? one      How many persons live in your home? one      Do you have any pets in your home? (please list) no      Highest level of education completed? Master degree      Current or past profession: Economist      Do you  exercise?                 yes                     Type & how often? Water aerobics 3 x week      Advanced Directives      Do you have a living will? yes      Do you have a DNR form?        yes                         If not, do you want to discuss one?      Do you have signed POA/HPOA for forms? yes      Functional Status : completed by self      Do you have difficulty bathing or dressing yourself? no      Do you have difficulty preparing food or eating? no      Do you have difficulty managing your medications? No      Do you have difficulty managing your finances? no      Do you have difficulty affording your medications? no    Outpatient Encounter Medications as of 10/19/2019  Medication Sig  . acetaminophen (TYLENOL) 500 MG tablet Take 500 mg by mouth every 6 (six) hours as needed.  . bimatoprost (LUMIGAN) 0.01 % SOLN Place 1 drop into both eyes at bedtime.  . brimonidine (ALPHAGAN) 0.15 % ophthalmic solution Place 1 drop into both eyes 3 (three) times daily.  . cetirizine (ZYRTEC) 10 MG tablet Take 10 mg by mouth at bedtime.   . Cholecalciferol (VITAMIN D3) 50 MCG (2000 UT) CHEW Chew 1 capsule by mouth daily.  Marland Kitchen losartan (COZAAR) 50 MG tablet TAKE 1 TABLET DAILY  . LUNESTA 2 MG TABS tablet Take one tablet by mouth at bedtime for sleep, take immediately before bedtime  . nitrofurantoin (MACRODANTIN) 50 MG capsule Take 1 capsule (50 mg total) by mouth daily.  Marland Kitchen Propylene Glycol (SYSTANE BALANCE) 0.6 % SOLN Apply 1 drop to eye daily as needed.  . rosuvastatin (CRESTOR) 5 MG tablet Take 5 mg by mouth at bedtime.  . senna-docusate (SENOKOT-S) 8.6-50 MG tablet Take 1 tablet by mouth as needed for mild constipation.   Facility-Administered Encounter Medications as of 10/19/2019  Medication  . 0.9 %  sodium chloride infusion  . betamethasone acetate-betamethasone sodium phosphate (CELESTONE) injection 12 mg    Activities of Daily Living In your present state of health, do you  have any difficulty performing the following activities: 10/19/2019 12/14/2018  Hearing? Tempie Donning  Comment wears hearing aids -  Vision? Y N  Comment wears eye glasses -  Difficulty concentrating or making decisions? N N  Walking or climbing stairs? N N  Dressing or bathing? N N  Doing errands, shopping? N N  Preparing Food and eating ? N -  Using the Toilet? N -  In the past six months, have you accidently leaked urine? N -  Do you have problems with loss of bowel control? N -  Managing your Medications? N -  Managing your Finances? N -  Housekeeping or managing your Housekeeping? Y -  Comment facility assist -  Some recent data might be hidden    Patient Care Team: Gayland Curry, DO as PCP - General (Geriatric Medicine) Rolm Bookbinder, MD as Consulting Physician (Dermatology) Marybelle Killings, MD as Consulting Physician (Orthopedic Surgery)  Jola Schmidt, MD as Consulting Physician (Ophthalmology) Milus Banister, MD as Attending Physician (Gastroenterology) Regal, Tamala Fothergill, DPM as Consulting Physician (Podiatry) Lucas Mallow, MD as Consulting Physician (Urology) Jodi Marble, MD as Consulting Physician (Otolaryngology)    Assessment:   This is a routine wellness examination for Linda [name].  Exercise Activities and Dietary recommendations Current Exercise Habits: Structured exercise class, Type of exercise: stretching;Other - see comments(water aerobics), Time (Minutes): 45, Frequency (Times/Week): 5, Weekly Exercise (Minutes/Week): 225, Intensity: Moderate, Exercise limited by: None identified  Goals   None     Fall Risk Fall Risk  10/19/2019 07/13/2019 01/19/2019 10/20/2018 08/24/2018  Falls in the past year? 0 0 0 0 No  Number falls in past yr: 0 0 0 0 -  Injury with Fall? 0 0 0 0 -  Risk for fall due to : - - Medication side effect;Orthopedic patient - Impaired balance/gait  Follow up - - Falls evaluation completed;Falls prevention discussed - -   Is the patient's home  free of loose throw rugs in walkways, pet beds, electrical cords, etc?   no      Grab bars in the bathroom? yes      Handrails on the stairs?  no Stairs       Adequate lighting?   yes  Depression Screen PHQ 2/9 Scores 10/19/2019 07/13/2019 01/19/2019 10/20/2018  PHQ - 2 Score 0 0 0 0     Cognitive Function     6CIT Screen 10/19/2019  What Year? 0 points  What month? 0 points  What time? 0 points  Count back from 20 0 points  Months in reverse 0 points  Repeat phrase 2 points  Total Score 2    Immunization History  Administered Date(s) Administered  . Influenza, High Dose Seasonal PF 09/26/2014, 08/17/2015, 08/19/2016, 08/20/2017, 08/24/2018, 09/23/2019  . Pneumococcal Conjugate-13 09/07/2014  . Pneumococcal Polysaccharide-23 09/21/2007  . Tdap 05/23/2010  . Zoster 10/24/2005  . Zoster Recombinat (Shingrix) 09/22/2017, 11/25/2017    Qualifies for Shingles Vaccine? Up date   Screening Tests Health Maintenance  Topic Date Due  . INFLUENZA VACCINE  Completed  . DEXA SCAN  Completed  . TETANUS/TDAP  Discontinued  . PNA vac Low Risk Adult  Discontinued    Cancer Screenings: Lung: Low Dose CT Chest recommended if Age 66-80 years, 30 pack-year currently smoking OR have quit w/in 15years. Patient does not qualify. Breast:  Up to date on Mammogram? Yes   Up to date of Bone Density/Dexa? Yes Colorectal: Age Out   Additional Screenings: Hepatitis C Screening: Low Risk   Plan:   I have personally reviewed and noted the following in the patient's chart:   . Medical and social history . Use of alcohol, tobacco or illicit drugs  . Current medications and supplements . Functional ability and status . Nutritional status . Physical activity . Advanced directives . List of other physicians . Hospitalizations, surgeries, and ER visits in previous 12 months . Vitals . Screenings to include cognitive, depression, and falls . Referrals and appointments  In addition, I have  reviewed and discussed with patient certain preventive protocols, quality metrics, and best practice recommendations. A written personalized care plan for preventive services as well as general preventive health recommendations were provided to patient.  Sandrea Hughs, NP  10/19/2019

## 2019-10-19 NOTE — Patient Instructions (Signed)
Linda [name] , Thank you for taking time to come for your Medicare Wellness Visit. I appreciate your ongoing commitment to your health goals. Please review the following plan we discussed and let me know if I can assist you in the future.   Screening recommendations/referrals: Colonoscopy: Age out  Mammogram: Up to date  Bone Density: Up to date  Recommended yearly ophthalmology/optometry visit for glaucoma screening and checkup Recommended yearly dental visit for hygiene and checkup  Vaccinations: Influenza vaccine :Up to date  Pneumococcal vaccine : Up to date  Tdap vaccine : Up to date due 2021  Shingles vaccine : Up to date     Advanced directives: Yes   Conditions/risks identified: Advance age female > 32 yrs,dyslipidemia,BMI > 30   Next appointment: 1 year    Preventive Care 39 Years and Older, Female Preventive care refers to lifestyle choices and visits with your health care provider that can promote health and wellness. What does preventive care include?  A yearly physical exam. This is also called an annual well check.  Dental exams once or twice a year.  Routine eye exams. Ask your health care provider how often you should have your eyes checked.  Personal lifestyle choices, including:  Daily care of your teeth and gums.  Regular physical activity.  Eating a healthy diet.  Avoiding tobacco and drug use.  Limiting alcohol use.  Practicing safe sex.  Taking low-dose aspirin every day.  Taking vitamin and mineral supplements as recommended by your health care provider. What happens during an annual well check? The services and screenings done by your health care provider during your annual well check will depend on your age, overall health, lifestyle risk factors, and family history of disease. Counseling  Your health care provider may ask you questions about your:  Alcohol use.  Tobacco use.  Drug use.  Emotional well-being.  Home and  relationship well-being.  Sexual activity.  Eating habits.  History of falls.  Memory and ability to understand (cognition).  Work and work Statistician.  Reproductive health. Screening  You may have the following tests or measurements:  Height, weight, and BMI.  Blood pressure.  Lipid and cholesterol levels. These may be checked every 5 years, or more frequently if you are over 75 years old.  Skin check.  Lung cancer screening. You may have this screening every year starting at age 60 if you have a 30-pack-year history of smoking and currently smoke or have quit within the past 15 years.  Fecal occult blood test (FOBT) of the stool. You may have this test every year starting at age 78.  Flexible sigmoidoscopy or colonoscopy. You may have a sigmoidoscopy every 5 years or a colonoscopy every 10 years starting at age 54.  Hepatitis C blood test.  Hepatitis B blood test.  Sexually transmitted disease (STD) testing.  Diabetes screening. This is done by checking your blood sugar (glucose) after you have not eaten for a while (fasting). You may have this done every 1-3 years.  Bone density scan. This is done to screen for osteoporosis. You may have this done starting at age 26.  Mammogram. This may be done every 1-2 years. Talk to your health care provider about how often you should have regular mammograms. Talk with your health care provider about your test results, treatment options, and if necessary, the need for more tests. Vaccines  Your health care provider may recommend certain vaccines, such as:  Influenza vaccine. This is recommended every  year.  Tetanus, diphtheria, and acellular pertussis (Tdap, Td) vaccine. You may need a Td booster every 10 years.  Zoster vaccine. You may need this after age 40.  Pneumococcal 13-valent conjugate (PCV13) vaccine. One dose is recommended after age 52.  Pneumococcal polysaccharide (PPSV23) vaccine. One dose is recommended after  age 46. Talk to your health care provider about which screenings and vaccines you need and how often you need them. This information is not intended to replace advice given to you by your health care provider. Make sure you discuss any questions you have with your health care provider. Document Released: 12/28/2015 Document Revised: 08/20/2016 Document Reviewed: 10/02/2015 Elsevier Interactive Patient Education  2017 [place] Prevention in the Home Falls can cause injuries. They can happen to people of all ages. There are many things you can do to make your home safe and to help prevent falls. What can I do on the outside of my home?  Regularly fix the edges of walkways and driveways and fix any cracks.  Remove anything that might make you trip as you walk through a door, such as a raised step or threshold.  Trim any bushes or trees on the path to your home.  Use bright outdoor lighting.  Clear any walking paths of anything that might make someone trip, such as rocks or tools.  Regularly check to see if handrails are loose or broken. Make sure that both sides of any steps have handrails.  Any raised decks and porches should have guardrails on the edges.  Have any leaves, snow, or ice cleared regularly.  Use sand or salt on walking paths during winter.  Clean up any spills in your garage right away. This includes oil or grease spills. What can I do in the bathroom?  Use night lights.  Install grab bars by the toilet and in the tub and shower. Do not use towel bars as grab bars.  Use non-skid mats or decals in the tub or shower.  If you need to sit down in the shower, use a plastic, non-slip stool.  Keep the floor dry. Clean up any water that spills on the floor as soon as it happens.  Remove soap buildup in the tub or shower regularly.  Attach bath mats securely with double-sided non-slip rug tape.  Do not have throw rugs and other things on the floor that can  make you trip. What can I do in the bedroom?  Use night lights.  Make sure that you have a light by your bed that is easy to reach.  Do not use any sheets or blankets that are too big for your bed. They should not hang down onto the floor.  Have a firm chair that has side arms. You can use this for support while you get dressed.  Do not have throw rugs and other things on the floor that can make you trip. What can I do in the kitchen?  Clean up any spills right away.  Avoid walking on wet floors.  Keep items that you use a lot in easy-to-reach places.  If you need to reach something above you, use a strong step stool that has a grab bar.  Keep electrical cords out of the way.  Do not use floor polish or wax that makes floors slippery. If you must use wax, use non-skid floor wax.  Do not have throw rugs and other things on the floor that can make you trip. What can  I do with my stairs?  Do not leave any items on the stairs.  Make sure that there are handrails on both sides of the stairs and use them. Fix handrails that are broken or loose. Make sure that handrails are as long as the stairways.  Check any carpeting to make sure that it is firmly attached to the stairs. Fix any carpet that is loose or worn.  Avoid having throw rugs at the top or bottom of the stairs. If you do have throw rugs, attach them to the floor with carpet tape.  Make sure that you have a light switch at the top of the stairs and the bottom of the stairs. If you do not have them, ask someone to add them for you. What else can I do to help prevent falls?  Wear shoes that:  Do not have high heels.  Have rubber bottoms.  Are comfortable and fit you well.  Are closed at the toe. Do not wear sandals.  If you use a stepladder:  Make sure that it is fully opened. Do not climb a closed stepladder.  Make sure that both sides of the stepladder are locked into place.  Ask someone to hold it for you,  if possible.  Clearly mark and make sure that you can see:  Any grab bars or handrails.  First and last steps.  Where the edge of each step is.  Use tools that help you move around (mobility aids) if they are needed. These include:  Canes.  Walkers.  Scooters.  Crutches.  Turn on the lights when you go into a dark area. Replace any light bulbs as soon as they burn out.  Set up your furniture so you have a clear path. Avoid moving your furniture around.  If any of your floors are uneven, fix them.  If there are any pets around you, be aware of where they are.  Review your medicines with your doctor. Some medicines can make you feel dizzy. This can increase your chance of falling. Ask your doctor what other things that you can do to help prevent falls. This information is not intended to replace advice given to you by your health care provider. Make sure you discuss any questions you have with your health care provider. Document Released: 09/27/2009 Document Revised: 05/08/2016 Document Reviewed: 01/05/2015 Elsevier Interactive Patient Education  2017 [employer].

## 2019-10-19 NOTE — Progress Notes (Signed)
This service is provided via telemedicine  No vital signs collected/recorded due to the encounter was a telemedicine visit.   Location of patient (ex: home, work):  Home   Patient consents to a telephone visit:  Yes  Location of the provider (ex: office, home):  Office   Name of any referring provider:  Hollace Kinnier D.O.   Names of all persons participating in the telemedicine service and their role in the encounter:  Marlowe Sax, NP, Ruthell Rummage CMA, and Athenna Elkind   Time spent on call:  Ruthell Rummage CMA, spent 14 minutes on phone with patient

## 2019-12-15 ENCOUNTER — Other Ambulatory Visit: Payer: Self-pay

## 2019-12-15 ENCOUNTER — Ambulatory Visit (INDEPENDENT_AMBULATORY_CARE_PROVIDER_SITE_OTHER): Payer: Medicare Other | Admitting: Adult Health

## 2019-12-15 ENCOUNTER — Encounter: Payer: Self-pay | Admitting: Adult Health

## 2019-12-15 VITALS — BP 118/78 | HR 71 | Temp 96.4°F | Ht 64.5 in

## 2019-12-15 DIAGNOSIS — R21 Rash and other nonspecific skin eruption: Secondary | ICD-10-CM | POA: Diagnosis not present

## 2019-12-15 MED ORDER — TRIAMCINOLONE ACETONIDE 0.5 % EX OINT: 1.0000 "application " | TOPICAL_OINTMENT | Freq: Two times a day (BID) | CUTANEOUS | 0 refills | Status: AC

## 2019-12-15 NOTE — Progress Notes (Signed)
[facility]  Provider:  Durenda Age - NP  Code Status: Full Code  Goals of Care:  Advanced Directives 12/22/2018  Does Patient Have a Medical Advance Directive? Yes  Type of Advance Directive [place]  Does patient want to make changes to medical advance directive? No - Patient declined  Copy of [place] in Chart? Yes - validated most recent copy scanned in chart (See row information)     Chief Complaint  Patient presents with  . Acute Visit    Patient c/o of red rash on left arm 2 weeks ago, patient states she was at swimming pool 2 weeks ago and noticed after then. Patient denies feeling warm to touch, no burning, and no itching     HPI: Patient is a pleasant 85 y.o. female seen today for an acute visit for a rash on her right anterior forearm. The rash is erythematous, dry and circular. Two weeks ago she has started going back to swimming. It is not itchy nor painful. She has seasonal allergy and takes Zyrtec at bedtime. She denies having SOB, chills, nor fever.   Past Medical History:  Diagnosis Date  . Allergy   . Anxiety   . Arthritis   . Bursitis    right leg  . Family history of kidney cancer   . Family history of lung cancer   . Family history of uterine cancer   . GERD (gastroesophageal reflux disease)   . History of blood transfusion   . History of uterine cancer 01/19/2019  . Hyperlipidemia   . Hypertension   . Insomnia   . Non Hodgkin's lymphoma ([place]) 2017-2019   cured  . Osteopenia    MILD  . Urine retention   . Uterine cancer ([place])    at age 28    Past Surgical History:  Procedure Laterality Date  . ABDOMINAL HYSTERECTOMY     AGE 67  . APPENDECTOMY     AGE 34  . BLADDER SURGERY    . Catarct Surgery    . ECTOPIC PREGNANCY SURGERY     age 35  . LAPAROSCOPIC SMALL BOWEL RESECTION    . NM PET DX LYMPHOMA  10/2018   IN REMISSION  . occular pressure    . OTHER SURGICAL HISTORY    . REPLACEMENT  TOTAL KNEE BILATERAL    . TONSILLECTOMY AND ADENOIDECTOMY     age 29 or 24  . UTERINE CANCER SURGERY     age 77- located in vagina    No Known Allergies  Outpatient Encounter Medications as of 12/15/2019  Medication Sig  . acetaminophen (TYLENOL) 500 MG tablet Take 500 mg by mouth every 6 (six) hours as needed.  . bimatoprost (LUMIGAN) 0.01 % SOLN Place 1 drop into both eyes at bedtime.  . brimonidine (ALPHAGAN) 0.15 % ophthalmic solution Place 1 drop into both eyes 3 (three) times daily.  . cetirizine (ZYRTEC) 10 MG tablet Take 10 mg by mouth at bedtime.   . Cholecalciferol (VITAMIN D3) 50 MCG (2000 UT) CHEW Chew 1 capsule by mouth daily.  Marland Kitchen losartan (COZAAR) 50 MG tablet TAKE 1 TABLET DAILY  . LUNESTA 2 MG TABS tablet Take one tablet by mouth at bedtime for sleep, take immediately before bedtime  . nitrofurantoin (MACRODANTIN) 50 MG capsule Take 1 capsule (50 mg total) by mouth daily.  Marland Kitchen Propylene Glycol (SYSTANE BALANCE) 0.6 % SOLN Apply 1 drop to eye daily as needed.  . rosuvastatin (CRESTOR)  5 MG tablet Take 5 mg by mouth at bedtime.  . senna-docusate (SENOKOT-S) 8.6-50 MG tablet Take 1 tablet by mouth as needed for mild constipation.   Facility-Administered Encounter Medications as of 12/15/2019  Medication  . 0.9 %  sodium chloride infusion  . betamethasone acetate-betamethasone sodium phosphate (CELESTONE) injection 12 mg    Review of Systems:  Review of Systems  Constitutional: Negative for activity change, appetite change, chills and fever.  HENT: Negative for congestion, postnasal drip and sneezing.   Eyes: Negative.  Negative for pain and discharge.  Respiratory: Negative for cough, shortness of breath and wheezing.   Cardiovascular: Negative for leg swelling.  Gastrointestinal: Negative for abdominal pain, constipation and diarrhea.  Endocrine: Negative.   Genitourinary: Negative for difficulty urinating and dysuria.  Musculoskeletal: Negative for back pain and joint  swelling.  Skin: Positive for rash.  Neurological: Negative.  Negative for weakness and headaches.  Psychiatric/Behavioral: Negative.  Negative for confusion.    Health Maintenance  Topic Date Due  . INFLUENZA VACCINE  Completed  . DEXA SCAN  Completed  . TETANUS/TDAP  Discontinued  . PNA vac Low Risk Adult  Discontinued    Physical Exam: Vitals:   12/15/19 1100  BP: 118/78  Pulse: 71  Temp: (!) 96.4 F (35.8 C)  TempSrc: Temporal  SpO2: 96%  Height: 5' 4.5" (1.638 m)   Body mass index is 33.8 kg/m. Physical Exam Constitutional:      Appearance: Normal appearance. She is obese.  HENT:     Head: Normocephalic.     Nose: Nose normal.  Cardiovascular:     Rate and Rhythm: Normal rate and regular rhythm.     Pulses: Normal pulses.     Heart sounds: Normal heart sounds.  Pulmonary:     Effort: Pulmonary effort is normal.     Breath sounds: Normal breath sounds.  Abdominal:     General: Bowel sounds are normal.     Palpations: Abdomen is soft.  Musculoskeletal:        General: Normal range of motion.     Cervical back: Normal range of motion and neck supple.  Skin:    Findings: Rash present.     Comments: Circular erythematous rash on right anterior forearm  Neurological:     Mental Status: She is alert.     Labs reviewed: Basic Metabolic Panel: Recent Labs    12/19/18 1700  NA 138  K 3.4*  CL 103  CO2 27  GLUCOSE 100*  BUN 11  CREATININE 0.58  CALCIUM 9.0   Liver Function Tests: Recent Labs    12/19/18 1700  AST 42*  ALT 26  ALKPHOS 118  BILITOT 0.2*  PROT 6.7  ALBUMIN 3.5   Recent Labs    12/19/18 1700  LIPASE 25   CBC: Recent Labs    12/19/18 1700  WBC 5.8  NEUTROABS 4.0  HGB 11.4*  HCT 37.3  MCV 96.6  PLT 229    Assessment/Plan  1. Rash - advised not to swim in the pool for now and start Triamcinolone cream, keep skin clean and dry, monitor for infection - triamcinolone ointment (KENALOG) 0.5 %; Apply 1 application  topically 2 (two) times daily for 15 days.  Dispense: 30 g; Refill: 0 - has an appointment with [facility] dermatology on 12/21/18 with Dr. Martin Majestic    Labs/tests ordered:  None  Next appt:  01/11/2020

## 2019-12-15 NOTE — Patient Instructions (Signed)
Rash, Adult A rash is a change in the color of your skin. A rash can also change the way your skin feels. There are many different conditions and factors that can cause a rash. Some rashes may disappear after a few days, but some may last for a few weeks. Common causes of rashes include:  Viral infections, such as: ? Colds. ? Measles. ? Hand, foot, and mouth disease.  Bacterial infections, such as: ? Scarlet fever. ? Impetigo.  Fungal infections, such as Candida.  Allergic reactions to food, medicines, or skin care products. Follow these instructions at home: The goal of treatment is to stop the itching and keep the rash from spreading. Pay attention to any changes in your symptoms. Follow these instructions to help with your condition: Medicine Take or apply over-the-counter and prescription medicines only as told by your health care provider. These may include:  Corticosteroid creams to treat red or swollen skin.  Anti-itch lotions.  Oral allergy medicines (antihistamines).  Oral corticosteroids for severe symptoms.  Skin care  Apply cool compresses to the affected areas.  Do not scratch or rub your skin.  Avoid covering the rash. Make sure the rash is exposed to air as much as possible. Managing itching and discomfort  Avoid hot showers or baths, which can make itching worse. A cold shower may help.  Try taking a bath with: ? Epsom salts. Follow manufacturer instructions on the packaging. You can get these at your local pharmacy or grocery store. ? Baking soda. Pour a small amount into the bath as told by your health care provider. ? Colloidal oatmeal. Follow manufacturer instructions on the packaging. You can get this at your local pharmacy or grocery store.  Try applying baking soda paste to your skin. Stir water into baking soda until it reaches a paste-like consistency.  Try applying calamine lotion. This is an over-the-counter lotion that helps to relieve  itchiness.  Keep cool and out of the sun. Sweating and being hot can make itching worse. General instructions   Rest as needed.  Drink enough fluid to keep your urine pale yellow.  Wear loose-fitting clothing.  Avoid scented soaps, detergents, and perfumes. Use gentle soaps, detergents, perfumes, and other cosmetic products.  Avoid any substance that causes your rash. Keep a journal to help track what causes your rash. Write down: ? What you eat. ? What cosmetic products you use. ? What you drink. ? What you wear. This includes jewelry.  Keep all follow-up visits as told by your health care provider. This is important. Contact a health care provider if:  You sweat at night.  You lose weight.  You urinate more than normal.  You urinate less than normal, or you notice that your urine is a darker color than usual.  You feel weak.  You vomit.  Your skin or the whites of your eyes look yellow (jaundice).  Your skin: ? Tingles. ? Is numb.  Your rash: ? Does not go away after several days. ? Gets worse.  You are: ? Unusually thirsty. ? More tired than normal.  You have: ? New symptoms. ? Pain in your abdomen. ? A fever. ? Diarrhea. Get help right away if you:  Have a fever and your symptoms suddenly get worse.  Develop confusion.  Have a severe headache or a stiff neck.  Have severe joint pains or stiffness.  Have a seizure.  Develop a rash that covers all or most of your body. The rash may   or may not be painful.  Develop blisters that: ? Are on top of the rash. ? Grow larger or grow together. ? Are painful. ? Are inside your nose or mouth.  Develop a rash that: ? Looks like purple pinprick-sized spots all over your body. ? Has a "bull's eye" or looks like a target. ? Is not related to sun exposure, is red and painful, and causes your skin to peel. Summary  A rash is a change in the color of your skin. Some rashes disappear after a few days,  but some may last for a few weeks.  The goal of treatment is to stop the itching and keep the rash from spreading.  Take or apply over-the-counter and prescription medicines only as told by your health care provider.  Contact a health care provider if you have new or worsening symptoms.  Keep all follow-up visits as told by your health care provider. This is important. This information is not intended to replace advice given to you by your health care provider. Make sure you discuss any questions you have with your health care provider. Document Revised: 03/25/2019 Document Reviewed: 07/05/2018 Elsevier Patient Education  2020 [employer] Inc.  

## 2019-12-28 NOTE — Progress Notes (Signed)
Annisa Arnn [id number] - 86 y.o. female MRN [id number]  Date of birth: 22-Dec-1931  Office Visit Note: Visit Date: 10/17/2019 PCP: Gayland Curry, DO Referred by: Gayland Curry, DO  Subjective: Chief Complaint  Patient presents with  . Lower Back - Pain   HPI:  SHANIESHA EURESTI is a 86 y.o. female who comes in today At the request of Dr. Eduard Roux for interventional spine procedure.  Last injection completed in 2019 right L4 transforaminal injection was very beneficial for over 11 months.  She has had recent worsening over the last couple of months without known injury.  She reports pain in the lower back and buttocks region.  MRI from the time showed moderate stenosis at L4-5 with more lateral recess narrowing and foraminal narrowing on the right.  Symptoms are somewhat L4 and L5.  We will complete L4 injection today look at possibly L5 injection diagnostically.  ROS Otherwise per HPI.  Assessment & Plan: Visit Diagnoses:  1. Lumbar radiculopathy     Plan: No additional findings.   Meds & Orders:  Meds ordered this encounter  Medications  . betamethasone acetate-betamethasone sodium phosphate (CELESTONE) injection 12 mg    Orders Placed This Encounter  Procedures  . XR C-ARM NO REPORT  . Epidural Steroid injection    Follow-up: Return if symptoms worsen or fail to improve.   Procedures: No procedures performed  Lumbosacral Transforaminal Epidural Steroid Injection - Sub-Pedicular Approach with Fluoroscopic Guidance  Patient: Huldah Otterman [id number]      Date of Birth: Mar 27, 1932 MRN: [id number] PCP: Gayland Curry, DO      Visit Date: 10/17/2019   Universal Protocol:    Date/Time: 10/17/2019  Consent Given By: the patient  Position: PRONE  Additional Comments: Vital signs were monitored before and after the procedure. Patient was prepped and draped in the usual sterile fashion. The correct patient, procedure, and site was verified.   Injection Procedure Details:    Procedure Site One Meds Administered:  Meds ordered this encounter  Medications  . betamethasone acetate-betamethasone sodium phosphate (CELESTONE) injection 12 mg    Laterality: Right  Location/Site:  L4-L5  Needle size: 22 G  Needle type: Spinal  Needle Placement: Transforaminal  Findings:    -Comments: Excellent flow of contrast along the nerve and into the epidural space.  Procedure Details: After squaring off the end-plates to get a true AP view, the C-arm was positioned so that an oblique view of the foramen as noted above was visualized. The target area is just inferior to the "nose of the scotty dog" or sub pedicular. The soft tissues overlying this structure were infiltrated with 2-3 ml. of 1% Lidocaine without Epinephrine.  The spinal needle was inserted toward the target using a "trajectory" view along the fluoroscope beam.  Under AP and lateral visualization, the needle was advanced so it did not puncture dura and was located close the 6 O'Clock position of the pedical in AP tracterory. Biplanar projections were used to confirm position. Aspiration was confirmed to be negative for CSF and/or blood. A 1-2 ml. volume of Isovue-250 was injected and flow of contrast was noted at each level. Radiographs were obtained for documentation purposes.   After attaining the desired flow of contrast documented above, a 0.5 to 1.0 ml test dose of 0.25% Marcaine was injected into each respective transforaminal space.  The patient was observed for 90 seconds post injection.  After no sensory deficits were reported, and normal lower extremity motor  function was noted,   the above injectate was administered so that equal amounts of the injectate were placed at each foramen (level) into the transforaminal epidural space.   Additional Comments:  The patient tolerated the procedure well Dressing: 2 x 2 sterile gauze and Band-Aid    Post-procedure details: Patient was observed during the  procedure. Post-procedure instructions were reviewed.  Patient left the clinic in stable condition.      Clinical History: MRI LUMBAR SPINE WITHOUT CONTRAST  TECHNIQUE: Multiplanar, multisequence MR imaging of the lumbar spine was performed. No intravenous contrast was administered.  COMPARISON:  Radiography 09/14/2018  FINDINGS: Segmentation:  5 lumbar type vertebral bodies  Alignment: Mild dextrocurvature. Grade 1 anterolisthesis at L4-5 and slight retrolisthesis at L5-S1  Vertebrae:  No fracture, evidence of discitis, or bone lesion.  Conus medullaris and cauda equina: Conus extends to the L1 level. Conus and cauda equina appear normal.  Paraspinal and other soft tissues: Negative  Disc levels:  T12- L1: Shallow protrusion on sagittal imaging.  No impingement  L1-L2: Disc narrowing and bulging.  Negative facets.  No impingement  L2-L3: Advanced disc narrowing with mild bulging and endplate ridging. Mild posterior element hypertrophy. No impingement  L3-L4: Disc narrowing and bulging. Degenerative facet spurring. Patent spinal canal. There is moderate right foraminal narrowing. Patent left foramen.  L4-L5: Advanced facet arthropathy with spurring and anterolisthesis. Mild to moderate spinal stenosis. Bilateral subarticular recess effacement but the descending L5 nerve roots are medial and non-compressed. Noncompressive bilateral foraminal narrowing  L5-S1:Disc narrowing and ridging with bulge. Mild facet spurring. Moderate to advanced right foraminal stenosis. Noncompressive left foraminal narrowing.  IMPRESSION: 1. Diffuse disc and facet degeneration with L4-5 anterolisthesis. 2. L4-5 mild to moderate spinal stenosis. 3. Diffuse foraminal narrowing that is moderate to advanced on the right at L5-S1 and moderate on the right at L3-4.   Electronically Signed   By: Monte Fantasia M.D.   On: 10/21/2018 15:45     Objective:  VS:  HT:     WT:   BMI:     BP:(!) 143/67  HR:60bpm  TEMP: ( )  RESP:  Physical Exam Constitutional:      General: She is not in acute distress.    Appearance: Normal appearance. She is not ill-appearing.  HENT:     Head: Normocephalic and atraumatic.     Right Ear: External ear normal.     Left Ear: External ear normal.  Eyes:     Extraocular Movements: Extraocular movements intact.  Cardiovascular:     Rate and Rhythm: Normal rate.     Pulses: Normal pulses.  Musculoskeletal:     Right lower leg: No edema.     Left lower leg: No edema.     Comments: Patient has good distal strength with no pain over the greater trochanters.  No clonus or focal weakness.  Skin:    Findings: No erythema, lesion or rash.  Neurological:     General: No focal deficit present.     Mental Status: She is alert and oriented to person, place, and time.     Sensory: No sensory deficit.     Motor: No weakness or abnormal muscle tone.     Coordination: Coordination normal.  Psychiatric:        Mood and Affect: Mood normal.        Behavior: Behavior normal.     Ortho Exam Imaging: No results found.

## 2019-12-28 NOTE — Procedures (Signed)
Lumbosacral Transforaminal Epidural Steroid Injection - Sub-Pedicular Approach with Fluoroscopic Guidance  Patient: Linda [name] [id number]      Date of Birth: [birth year] MRN: [id number] PCP: Gayland Curry, DO      Visit Date: 10/17/2019   Universal Protocol:    Date/Time: 10/17/2019  Consent Given By: the patient  Position: PRONE  Additional Comments: Vital signs were monitored before and after the procedure. Patient was prepped and draped in the usual sterile fashion. The correct patient, procedure, and site was verified.   Injection Procedure Details:  Procedure Site One Meds Administered:  Meds ordered this encounter  Medications  . betamethasone acetate-betamethasone sodium phosphate (CELESTONE) injection 12 mg    Laterality: Right  Location/Site:  L4-L5  Needle size: 22 G  Needle type: Spinal  Needle Placement: Transforaminal  Findings:    -Comments: Excellent flow of contrast along the nerve and into the epidural space.  Procedure Details: After squaring off the end-plates to get a true AP view, the C-arm was positioned so that an oblique view of the foramen as noted above was visualized. The target area is just inferior to the "nose of the scotty dog" or sub pedicular. The soft tissues overlying this structure were infiltrated with 2-3 ml. of 1% Lidocaine without Epinephrine.  The spinal needle was inserted toward the target using a "trajectory" view along the fluoroscope beam.  Under AP and lateral visualization, the needle was advanced so it did not puncture dura and was located close the 6 O'Clock position of the pedical in AP tracterory. Biplanar projections were used to confirm position. Aspiration was confirmed to be negative for CSF and/or blood. A 1-2 ml. volume of Isovue-250 was injected and flow of contrast was noted at each level. Radiographs were obtained for documentation purposes.   After attaining the desired flow of contrast documented above, a 0.5 to  1.0 ml test dose of 0.25% Marcaine was injected into each respective transforaminal space.  The patient was observed for 90 seconds post injection.  After no sensory deficits were reported, and normal lower extremity motor function was noted,   the above injectate was administered so that equal amounts of the injectate were placed at each foramen (level) into the transforaminal epidural space.   Additional Comments:  The patient tolerated the procedure well Dressing: 2 x 2 sterile gauze and Band-Aid    Post-procedure details: Patient was observed during the procedure. Post-procedure instructions were reviewed.  Patient left the clinic in stable condition.

## 2019-12-29 DIAGNOSIS — Z23 Encounter for immunization: Secondary | ICD-10-CM | POA: Diagnosis not present

## 2019-12-30 DIAGNOSIS — H401131 Primary open-angle glaucoma, bilateral, mild stage: Secondary | ICD-10-CM | POA: Diagnosis not present

## 2019-12-30 DIAGNOSIS — H04123 Dry eye syndrome of bilateral lacrimal glands: Secondary | ICD-10-CM | POA: Diagnosis not present

## 2020-01-03 ENCOUNTER — Encounter: Payer: Self-pay | Admitting: Internal Medicine

## 2020-01-03 DIAGNOSIS — I10 Essential (primary) hypertension: Secondary | ICD-10-CM | POA: Diagnosis not present

## 2020-01-03 DIAGNOSIS — E785 Hyperlipidemia, unspecified: Secondary | ICD-10-CM | POA: Diagnosis not present

## 2020-01-03 LAB — BASIC METABOLIC PANEL
BUN: 32 — AB (ref 4–21)
CO2: 28 — AB (ref 13–22)
Chloride: 106 (ref 99–108)
Creatinine: 0.5 (ref 0.5–1.1)
Glucose: 95
Potassium: 4.3 (ref 3.4–5.3)
Sodium: 142 (ref 137–147)

## 2020-01-03 LAB — COMPREHENSIVE METABOLIC PANEL
Albumin: 4.5 (ref 3.5–5.0)
Calcium: 9.6 (ref 8.7–10.7)
Globulin: 2.2

## 2020-01-03 LAB — LIPID PANEL
Cholesterol: 164 (ref 0–200)
HDL: 64 (ref 35–70)
LDL Cholesterol: 76
Triglycerides: 116 (ref 40–160)

## 2020-01-03 LAB — CBC AND DIFFERENTIAL
HCT: 40 (ref 36–46)
Hemoglobin: 13.2 (ref 12.0–16.0)
Platelets: 176 (ref 150–399)
WBC: 4.6

## 2020-01-03 LAB — CBC: RBC: 4.24 (ref 3.87–5.11)

## 2020-01-05 ENCOUNTER — Encounter: Payer: Self-pay | Admitting: Internal Medicine

## 2020-01-05 DIAGNOSIS — L820 Inflamed seborrheic keratosis: Secondary | ICD-10-CM | POA: Diagnosis not present

## 2020-01-05 DIAGNOSIS — Z85828 Personal history of other malignant neoplasm of skin: Secondary | ICD-10-CM | POA: Diagnosis not present

## 2020-01-11 ENCOUNTER — Other Ambulatory Visit: Payer: Self-pay

## 2020-01-11 ENCOUNTER — Encounter: Payer: Self-pay | Admitting: Internal Medicine

## 2020-01-11 ENCOUNTER — Non-Acute Institutional Stay: Payer: Medicare Other | Admitting: Internal Medicine

## 2020-01-11 VITALS — BP 120/78 | HR 57 | Temp 96.8°F | Ht 64.0 in

## 2020-01-11 DIAGNOSIS — I872 Venous insufficiency (chronic) (peripheral): Secondary | ICD-10-CM | POA: Diagnosis not present

## 2020-01-11 DIAGNOSIS — K5909 Other constipation: Secondary | ICD-10-CM | POA: Diagnosis not present

## 2020-01-11 DIAGNOSIS — R339 Retention of urine, unspecified: Secondary | ICD-10-CM | POA: Diagnosis not present

## 2020-01-11 DIAGNOSIS — G4700 Insomnia, unspecified: Secondary | ICD-10-CM

## 2020-01-11 DIAGNOSIS — M48061 Spinal stenosis, lumbar region without neurogenic claudication: Secondary | ICD-10-CM | POA: Diagnosis not present

## 2020-01-11 DIAGNOSIS — Z9109 Other allergy status, other than to drugs and biological substances: Secondary | ICD-10-CM | POA: Diagnosis not present

## 2020-01-11 MED ORDER — ESZOPICLONE 2 MG PO TABS: 2.0000 mg | ORAL_TABLET | Freq: Every evening | ORAL | 3 refills | Status: DC | PRN

## 2020-01-11 NOTE — Progress Notes (Signed)
Provider:  Rexene Edison. Mariea Clonts, D.O., C.M.D. Location:  Occupational psychologist of Service:  Clinic (12)  Previous PCP: Gayland Curry, DO Linda [name] Care Team: Gayland Curry, DO as PCP - General (Geriatric Medicine) Rolm Bookbinder, MD as Consulting Physician (Dermatology) Marybelle Killings, MD as Consulting Physician (Orthopedic Surgery) Jola Schmidt, MD as Consulting Physician (Ophthalmology) Milus Banister, MD as Attending Physician (Gastroenterology) Regal, Tamala Fothergill, DPM as Consulting Physician (Podiatry) Lucas Mallow, MD as Consulting Physician (Urology) Jodi Marble, MD as Consulting Physician (Otolaryngology)  Extended Emergency Contact Information Primary Emergency Contact: [name] Address: [address]          [place], [place] [address] [name] of [place] Phone: [phone] Relation: Son Secondary Emergency Contact: [name] Address: [address] [place] [address] [name] of [place] Mobile Phone: [phone] Relation: Son  Code Status: DNR Goals of Care: Advanced Directive information Advanced Directives 01/11/2020  Does Linda [name] Have a Medical Advance Directive? Yes  Type of Advance Directive [place]  Does Linda [name] want to make changes to medical advance directive? No - Linda [name] declined  Copy of [place] in Chart? -   Chief Complaint  Linda [name] presents with  . Medical Management of Chronic Issues    Extended Visit  Bowel questions     HPI: Linda [name] is a 86 y.o. [name] seen today for her extended visit.    She c/o constipation.  She reviewed what I'd suggested a year ago for her bowels.  She is not having accidents which is a plus.  WE had discussed miralax and senna.  She did not like miralax.  She started to take senna.  Currently, she feels like on the right side of her abdomen, there is a hard portion that never comes out.  She has looser stools  that feel like they go around that area.    She had her first covid vaccine and has second one arranged.    She brought her paperwork from feb.    With her sleeping pill, she had been on the generic.  The pharmacy did not have the generic and we got her certified for the brand name.  $125 for 3 mos.  She'd been paying $10 per 3 mos before.    Labs reviewed with her were all good.    Tdap was recommended with her wellness visit.  She plans to get in August.    Past Medical History:  Diagnosis Date  . Allergy   . Anxiety   . Arthritis   . Bursitis    right leg  . Family history of kidney cancer   . Family history of lung cancer   . Family history of uterine cancer   . GERD (gastroesophageal reflux disease)   . History of blood transfusion   . History of uterine cancer 01/19/2019  . Hyperlipidemia   . Hypertension   . Insomnia   . Non Hodgkin's lymphoma ([place]) 2017-2019   cured  . Osteopenia    MILD  . Urine retention   . Uterine cancer ([place])    at age 62   Past Surgical History:  Procedure Laterality Date  . ABDOMINAL HYSTERECTOMY     AGE 14  . APPENDECTOMY     AGE 89  . BLADDER SURGERY    . Catarct Surgery    . ECTOPIC PREGNANCY SURGERY  age 39  . LAPAROSCOPIC SMALL BOWEL RESECTION    . NM PET DX LYMPHOMA  10/2018   IN REMISSION  . occular pressure    . OTHER SURGICAL HISTORY    . REPLACEMENT TOTAL KNEE BILATERAL    . TONSILLECTOMY AND ADENOIDECTOMY     age 72 or 66  . UTERINE CANCER SURGERY     age 81- located in vagina    reports that she has never smoked. She has never used smokeless tobacco. She reports current alcohol use of about 7.0 standard drinks of alcohol per week. She reports that she does not use drugs.  Functional Status Survey:  independent  Family History  Problem Relation Age of Onset  . Uterine cancer Sister 57       uterine cancer  . COPD Maternal Grandfather   . Lung cancer Maternal Grandfather   . Heart failure Mother 39  .  Pneumonia Father 59  . Dementia Father   . Uterine cancer Maternal Grandmother 62  . Other Sister        Guillain Barre syndrom  . Kidney cancer Son 2  . Dementia Paternal Grandmother   . Lung cancer Maternal Uncle   . Other Son        Familial Hypercholesterolemia  . Colon cancer Neg Hx   . Esophageal cancer Neg Hx   . Liver cancer Neg Hx   . Pancreatic cancer Neg Hx   . Rectal cancer Neg Hx   . Stomach cancer Neg Hx     Health Maintenance  Topic Date Due  . INFLUENZA VACCINE  Completed  . DEXA SCAN  Completed  . TETANUS/TDAP  Discontinued  . PNA vac Low Risk Adult  Discontinued    No Known Allergies  Outpatient Encounter Medications as of 01/11/2020  Medication Sig  . acetaminophen (TYLENOL) 500 MG tablet Take 500 mg by mouth every 6 (six) hours as needed.  . bimatoprost (LUMIGAN) 0.01 % SOLN Place 1 drop into both eyes at bedtime.  . brimonidine (ALPHAGAN) 0.15 % ophthalmic solution Place 1 drop into both eyes 3 (three) times daily.  . cetirizine (ZYRTEC) 10 MG tablet Take 10 mg by mouth at bedtime.   . Cholecalciferol (VITAMIN D3) 50 MCG (2000 UT) CHEW Chew 1 capsule by mouth daily.  Marland Kitchen losartan (COZAAR) 50 MG tablet TAKE 1 TABLET DAILY  . LUNESTA 2 MG TABS tablet Take one tablet by mouth at bedtime for sleep, take immediately before bedtime  . nitrofurantoin (MACRODANTIN) 50 MG capsule Take 1 capsule (50 mg total) by mouth daily.  Marland Kitchen Propylene Glycol (SYSTANE BALANCE) 0.6 % SOLN Apply 1 drop to eye daily as needed.  . rosuvastatin (CRESTOR) 5 MG tablet Take 5 mg by mouth at bedtime.  . senna-docusate (SENOKOT-S) 8.6-50 MG tablet Take 1 tablet by mouth as needed for mild constipation.   Facility-Administered Encounter Medications as of 01/11/2020  Medication  . 0.9 %  sodium chloride infusion  . betamethasone acetate-betamethasone sodium phosphate (CELESTONE) injection 12 mg    Review of Systems  Constitutional: Negative for chills, fever and weight loss.  HENT:  Positive for congestion and hearing loss. Negative for sore throat.   Eyes: Negative for blurred vision.  Respiratory: Negative for cough and shortness of breath.   Cardiovascular: Negative for chest pain, palpitations and leg swelling.  Gastrointestinal: Positive for constipation. Negative for abdominal pain, blood in stool, diarrhea, melena, nausea and vomiting.  Genitourinary:       Chronic urinary retention  Musculoskeletal: Negative for back pain, falls and joint pain.  Skin: Negative for itching and rash.  Neurological: Negative for dizziness and loss of consciousness.  Endo/Heme/Allergies: Bruises/bleeds easily.  Psychiatric/Behavioral: Negative for depression and memory loss. The Linda [name] has insomnia. The Linda [name] is not nervous/anxious.     Vitals:   01/11/20 1009  BP: 120/78  Pulse: (!) 57  Temp: (!) 96.8 F (36 C)  SpO2: 97%  Height: 5\' 4"  (1.626 m)   Body mass index is 34.33 kg/m. Physical Exam Vitals reviewed.  Constitutional:      General: She is not in acute distress.    Appearance: Normal appearance. She is not ill-appearing or toxic-appearing.  HENT:     Head: Normocephalic and atraumatic.     Right Ear: Tympanic membrane, ear canal and external ear normal.     Left Ear: Tympanic membrane, ear canal and external ear normal.     Ears:     Comments: Hearing aids    Nose:     Comments: Nose and mouth deferred due to covid masking and no related concerns Eyes:     Extraocular Movements: Extraocular movements intact.     Conjunctiva/sclera: Conjunctivae normal.     Pupils: Pupils are equal, round, and reactive to light.  Neck:     Vascular: No carotid bruit.  Cardiovascular:     Rate and Rhythm: Normal rate and regular rhythm.     Pulses: Normal pulses.     Heart sounds: Normal heart sounds.  Pulmonary:     Effort: Pulmonary effort is normal. No respiratory distress.     Breath sounds: Normal breath sounds. No wheezing, rhonchi or rales.  Abdominal:      General: Bowel sounds are normal. There is no distension.     Palpations: Abdomen is soft. There is no mass.     Tenderness: There is no abdominal tenderness. There is no guarding or rebound.  Musculoskeletal:        General: No swelling or tenderness. Normal range of motion.     Cervical back: Neck supple. No rigidity or tenderness.     Right lower leg: No edema.     Left lower leg: No edema.  Lymphadenopathy:     Cervical: No cervical adenopathy.  Skin:    General: Skin is warm and dry.  Neurological:     General: No focal deficit present.     Mental Status: She is alert.  Psychiatric:        Mood and Affect: Mood normal.     Labs reviewed: Basic Metabolic Panel: Recent Labs    01/03/20 0500  NA 142  K 4.3  CL 106  CO2 28*  BUN 32*  CREATININE 0.5  CALCIUM 9.6   Liver Function Tests: Recent Labs    01/03/20 0500  ALBUMIN 4.5   No results for input(s): LIPASE, AMYLASE in the last 8760 hours. No results for input(s): AMMONIA in the last 8760 hours. CBC: Recent Labs    01/03/20 0500  WBC 4.6  HGB 13.2  HCT 40  PLT 176   Assessment/Plan 1. Chronic constipation with overflow incontinence -recommended she continue her senokot despite the article she read and add miralax every other day and she can adjust its' frequency as she needs to  2. Insomnia, unspecified type - will renew generic of lunesta with mail order pharmacy now that available due to cost of brand drug - eszopiclone (LUNESTA) 2 MG TABS tablet; Take 1 tablet (2 mg total) by  mouth at bedtime as needed for sleep. Take immediately before bedtime  Dispense: 90 tablet; Refill: 3  3. Venous insufficiency of both lower extremities -cont to elevate feet at rest, avoid high sodium foods  4. Environmental allergies -cont zyrtec  5. Incomplete emptying of bladder -continue I/O caths, no changes  6. Lumbar foraminal stenosis -not bothersome as of late; previously had injection with Dr. Ernestina Patches when  flared up  Labs/tests ordered: none needed (just had) F/u in 6 mos for routine check up  [place]. Hilbert Briggs, D.O. [facility] [address], [place] [address] Cell Phone (Mon-Fri 8am-5pm):  [phone] On Call:  [phone] & follow prompts after 5pm & weekends Office Phone:  [phone] Office Fax:  [phone]

## 2020-01-11 NOTE — Patient Instructions (Signed)
I recommend adding miralax every 3rd day to your regimen to see if you can get the hard area of stool to move.  Also, drinking your water is crucial.

## 2020-01-19 DIAGNOSIS — H04121 Dry eye syndrome of right lacrimal gland: Secondary | ICD-10-CM | POA: Diagnosis not present

## 2020-01-19 DIAGNOSIS — H401131 Primary open-angle glaucoma, bilateral, mild stage: Secondary | ICD-10-CM | POA: Diagnosis not present

## 2020-01-24 DIAGNOSIS — Z23 Encounter for immunization: Secondary | ICD-10-CM | POA: Diagnosis not present

## 2020-02-06 ENCOUNTER — Other Ambulatory Visit: Payer: Self-pay | Admitting: Internal Medicine

## 2020-02-08 ENCOUNTER — Telehealth: Payer: Self-pay | Admitting: *Deleted

## 2020-02-08 DIAGNOSIS — Z822 Family history of deafness and hearing loss: Secondary | ICD-10-CM | POA: Diagnosis not present

## 2020-02-08 DIAGNOSIS — H903 Sensorineural hearing loss, bilateral: Secondary | ICD-10-CM

## 2020-02-08 NOTE — Telephone Encounter (Signed)
Patient called and left message on Clinical intake requesting a referral to be placed to [facility]. Please advise.

## 2020-02-08 NOTE — Telephone Encounter (Signed)
Referral placed to [facility].

## 2020-02-08 NOTE — Addendum Note (Signed)
Addended by: Gayland Curry on: 02/08/2020 03:33 PM   Modules accepted: Orders

## 2020-03-26 ENCOUNTER — Telehealth: Payer: Self-pay | Admitting: *Deleted

## 2020-03-26 NOTE — Telephone Encounter (Signed)
Received Prior Authorization for Linda [name] from [id number] Fax: [phone]  Filled out and placed in Dr. Cyndi Lennert folder to complete. To be faxed back once completed.

## 2020-04-17 DIAGNOSIS — N3000 Acute cystitis without hematuria: Secondary | ICD-10-CM | POA: Diagnosis not present

## 2020-04-17 DIAGNOSIS — R350 Frequency of micturition: Secondary | ICD-10-CM | POA: Diagnosis not present

## 2020-04-17 DIAGNOSIS — R338 Other retention of urine: Secondary | ICD-10-CM | POA: Diagnosis not present

## 2020-04-17 DIAGNOSIS — N312 Flaccid neuropathic bladder, not elsewhere classified: Secondary | ICD-10-CM | POA: Diagnosis not present

## 2020-07-02 ENCOUNTER — Other Ambulatory Visit: Payer: Self-pay | Admitting: Internal Medicine

## 2020-07-02 DIAGNOSIS — G4700 Insomnia, unspecified: Secondary | ICD-10-CM

## 2020-07-02 NOTE — Telephone Encounter (Signed)
Last filled 01/11/2020  No treatment agreement on file, note made on pending appointment to update treatment agreement

## 2020-07-09 DIAGNOSIS — N139 Obstructive and reflux uropathy, unspecified: Secondary | ICD-10-CM | POA: Diagnosis not present

## 2020-07-09 DIAGNOSIS — N312 Flaccid neuropathic bladder, not elsewhere classified: Secondary | ICD-10-CM | POA: Diagnosis not present

## 2020-07-18 ENCOUNTER — Encounter: Payer: Self-pay | Admitting: Internal Medicine

## 2020-07-23 ENCOUNTER — Telehealth: Payer: Self-pay | Admitting: *Deleted

## 2020-07-23 DIAGNOSIS — Z20828 Contact with and (suspected) exposure to other viral communicable diseases: Secondary | ICD-10-CM | POA: Diagnosis not present

## 2020-07-23 DIAGNOSIS — Z9189 Other specified personal risk factors, not elsewhere classified: Secondary | ICD-10-CM | POA: Diagnosis not present

## 2020-07-23 NOTE — Telephone Encounter (Signed)
Yes, of course

## 2020-07-23 NOTE — Telephone Encounter (Signed)
Linda [name] notified and agreed.

## 2020-07-23 NOTE — Telephone Encounter (Signed)
Mliss Sax with [facility] called requesting verbal orders for Covid Testing on patient. Stated that patient is having Symptoms and was out of town last Tuesday and came home on Friday.  Nurse wanting to perform Covid testing before patient's appointment this week 8/11.   Please Advise.

## 2020-07-25 ENCOUNTER — Other Ambulatory Visit: Payer: Self-pay

## 2020-07-25 ENCOUNTER — Encounter: Payer: Self-pay | Admitting: Internal Medicine

## 2020-07-25 ENCOUNTER — Non-Acute Institutional Stay: Payer: Medicare Other | Admitting: Internal Medicine

## 2020-07-25 VITALS — BP 122/82 | HR 71 | Temp 97.8°F | Ht 63.0 in

## 2020-07-25 DIAGNOSIS — H903 Sensorineural hearing loss, bilateral: Secondary | ICD-10-CM | POA: Diagnosis not present

## 2020-07-25 DIAGNOSIS — K5909 Other constipation: Secondary | ICD-10-CM | POA: Diagnosis not present

## 2020-07-25 DIAGNOSIS — F5101 Primary insomnia: Secondary | ICD-10-CM

## 2020-07-25 DIAGNOSIS — I872 Venous insufficiency (chronic) (peripheral): Secondary | ICD-10-CM | POA: Diagnosis not present

## 2020-07-25 DIAGNOSIS — M48061 Spinal stenosis, lumbar region without neurogenic claudication: Secondary | ICD-10-CM | POA: Diagnosis not present

## 2020-07-25 DIAGNOSIS — J029 Acute pharyngitis, unspecified: Secondary | ICD-10-CM

## 2020-07-25 NOTE — Progress Notes (Signed)
Location:  Occupational psychologist of Service:  Clinic (12)  Provider: Dawnya Grams L. Mariea Clonts, D.O., C.M.D.  Code Status: DNR Goals of Care:  Advanced Directives 01/11/2020  Does Patient Have a Medical Advance Directive? Yes  Type of Advance Directive [place]  Does patient want to make changes to medical advance directive? No - Patient declined  Copy of [place] in Chart? -     Chief Complaint  Patient presents with  . Medical Management of Chronic Issues    6 month follow up     HPI: Patient is a 86 y.o. female seen today for medical management of chronic diseases.  Out of town and had come home last fri.  She was hoarse by Friday night.  Her sister who was with her did not have any symptoms.   She is hydrating, drinking key with honey.    covid test was negative from 8/9--result was negative last night.   Everything is going well with her otherwise.  She takes senna 3 days in a row.  The 4th day, she takes miralax.  That is keeping her bowels in check.    Does not have chewable vitamin D.  She's been using benadryl temporarily for congestion.  She walks to the pool and back and exercises in the pool normally when not isolated.  Does 45 mins in the pool 3 times per week.  Wants to get back to it.    Eyes were dry.  She was put on eye drops for 3 wks and it improved.  She got new Rx for her glasses.    Hearing aids and glasses are both working well  If takes her tylenol, she can move her joints well.  Uses cane for safety.  [place] wheelchairs [place] airports.    It was sad seeing her sister in [place] who is not healthy, but lives alone there.  They stayed in and had gourmet prepared food or uber delivered pizza.    Past Medical History:  Diagnosis Date  . Allergy   . Anxiety   . Arthritis   . Bursitis    right leg  . Family history of kidney cancer   . Family history of lung cancer   . Family history of uterine cancer   .  GERD (gastroesophageal reflux disease)   . History of blood transfusion   . History of uterine cancer 01/19/2019  . Hyperlipidemia   . Hypertension   . Insomnia   . Non Hodgkin's lymphoma ([place]) 2017-2019   cured  . Osteopenia    MILD  . Urine retention   . Uterine cancer ([place])    at age 21    Past Surgical History:  Procedure Laterality Date  . ABDOMINAL HYSTERECTOMY     AGE 6  . APPENDECTOMY     AGE 87  . BLADDER SURGERY    . Catarct Surgery    . ECTOPIC PREGNANCY SURGERY     age 7  . LAPAROSCOPIC SMALL BOWEL RESECTION    . NM PET DX LYMPHOMA  10/2018   IN REMISSION  . occular pressure    . OTHER SURGICAL HISTORY    . REPLACEMENT TOTAL KNEE BILATERAL    . TONSILLECTOMY AND ADENOIDECTOMY     age 7 or 61  . UTERINE CANCER SURGERY     age 33- located in vagina    No Known Allergies  Outpatient Encounter Medications as of 07/25/2020  Medication  Sig  . eszopiclone (LUNESTA) 2 MG TABS tablet TAKE 1 TABLET AT BEDTIME ASNEEDED FOR SLEEP. TAKE     IMMEDIATELY BEFORE BEDTIME.  Marland Kitchen acetaminophen (TYLENOL) 500 MG tablet Take 500 mg by mouth every 6 (six) hours as needed.  . bimatoprost (LUMIGAN) 0.01 % SOLN Place 1 drop into both eyes at bedtime.  . brimonidine (ALPHAGAN) 0.15 % ophthalmic solution Place 1 drop into both eyes 3 (three) times daily.  . cetirizine (ZYRTEC) 10 MG tablet Take 10 mg by mouth at bedtime.   . Cholecalciferol (VITAMIN D3) 50 MCG (2000 UT) CHEW Chew 1 capsule by mouth daily.  Marland Kitchen losartan (COZAAR) 50 MG tablet TAKE 1 TABLET DAILY  . nitrofurantoin (MACRODANTIN) 50 MG capsule Take 1 capsule (50 mg total) by mouth daily.  Marland Kitchen Propylene Glycol (SYSTANE BALANCE) 0.6 % SOLN Apply 1 drop to eye daily as needed.  . rosuvastatin (CRESTOR) 5 MG tablet TAKE 1 TABLET AT BEDTIME  . senna-docusate (SENOKOT-S) 8.6-50 MG tablet Take 1 tablet by mouth as needed for mild constipation.   No facility-administered encounter medications on file as of 07/25/2020.    Review of  Systems:  Review of Systems  Constitutional: Negative for chills and fever.  HENT: Positive for congestion, hearing loss and sore throat.        Hearing aids; hoarseness  Eyes: Negative for blurred vision.  Respiratory: Positive for cough. Negative for sputum production and shortness of breath.   Cardiovascular: Negative for chest pain, palpitations and leg swelling.  Gastrointestinal: Positive for constipation. Negative for abdominal pain, blood in stool, diarrhea and melena.       Doing ok with stool softener and miralax combo  Genitourinary: Negative for dysuria.  Musculoskeletal: Negative for falls and joint pain.  Skin: Negative for rash.  Neurological: Negative for dizziness and loss of consciousness.  Psychiatric/Behavioral: Negative for depression and memory loss. The patient has insomnia. The patient is not nervous/anxious.        Only sleeps with her sleeping med    Health Maintenance  Topic Date Due  . INFLUENZA VACCINE  07/15/2020  . DEXA SCAN  Completed  . COVID-19 Vaccine  Completed  . TETANUS/TDAP  Discontinued  . PNA vac Low Risk Adult  Discontinued    Physical Exam: There were no vitals filed for this visit. There is no height or weight on file to calculate BMI. Physical Exam Vitals reviewed.  Constitutional:      General: She is not in acute distress.    Appearance: Normal appearance. She is obese. She is not toxic-appearing.     Comments: Refused her weight  HENT:     Head: Normocephalic and atraumatic.     Ears:     Comments: Hearing aids Eyes:     Comments: Glasses (new)  Cardiovascular:     Rate and Rhythm: Normal rate and regular rhythm.     Pulses: Normal pulses.     Heart sounds: Normal heart sounds.  Pulmonary:     Effort: Pulmonary effort is normal.     Breath sounds: Normal breath sounds. No wheezing, rhonchi or rales.  Abdominal:     General: Bowel sounds are normal.  Musculoskeletal:        General: Normal range of motion.     Right  lower leg: No edema.     Left lower leg: No edema.  Skin:    General: Skin is warm and dry.  Neurological:     General: No focal deficit present.  Mental Status: She is alert and oriented to person, place, and time.  Psychiatric:        Mood and Affect: Mood normal.        Behavior: Behavior normal.     Labs reviewed: Basic Metabolic Panel: Recent Labs    01/03/20 0500  NA 142  K 4.3  CL 106  CO2 28*  BUN 32*  CREATININE 0.5  CALCIUM 9.6   Liver Function Tests: Recent Labs    01/03/20 0500  ALBUMIN 4.5   No results for input(s): LIPASE, AMYLASE in the last 8760 hours. No results for input(s): AMMONIA in the last 8760 hours. CBC: Recent Labs    01/03/20 0500  WBC 4.6  HGB 13.2  HCT 40  PLT 176   Lipid Panel: Recent Labs    01/03/20 0000  CHOL 164  HDL 64  LDLCALC 76  TRIG 116   No results found for: HGBA1C  Procedures since last visit: No results found.  Assessment/Plan 1. Acute pharyngitis, unspecified etiology -was covid negative so can come out of isolation, but must wear mask inside per [facility] rules and CDC and local guidelines at this time  2. Sensorineural hearing loss (SNHL) of both ears -hearing aids working well  3. Chronic constipation with overflow incontinence -cont senna and miralax regimen, hydration, adequate fiber  4. Venous insufficiency of both lower extremities -elevate feet at rest  5. Lumbar foraminal stenosis -cont water exercise, tylenol when bad, topicals  6. Primary insomnia -needs to sign contract for sleeping pill next time [name]) -got missed today due to ocners about covid protocols  Labs/tests ordered:  * No order type specified * Next appt:  10/19/2020  Fatumata Kashani L. Suzzette Gasparro, D.O. [facility] [address], [place] [address] Cell Phone (Mon-Fri 8am-5pm):  [phone] On Call:  [phone] & follow prompts after 5pm & weekends Office Phone:   [phone] Office Fax:  [phone]

## 2020-07-26 ENCOUNTER — Telehealth: Payer: Self-pay

## 2020-07-26 NOTE — Telephone Encounter (Signed)
Incoming call received from patient stating she seen Dr.Reed at 10:00 am yesterday and Dr.Reed was suppose to follow-up with her at lunch time and she has not heard anything.  Patient is questioning if Dr..Reed gave orders to staff at [facility] advising that she can be removed from isolation. Patient states " I'm tired of being on lock down."    I had a verbal conversation with Randell Patient  (Dr.Reed's medical assistant) and asked if she is aware if orders were given.  Per Randell Patient she has a sticky note that Dr.Reed gave her that states  - ok for patient to come out of isolation - continue masking indoors per CDC guidelines and [facility] rules.   Randell Patient was unaware if an order was provided to the [facility] staff.   Please advise

## 2020-07-26 NOTE — Telephone Encounter (Signed)
[facility] clinic nurse was not present in the afternoon to be notified, but pt had been told her results were negative the night before and had come to her appt indicating they thought she could come off isolation.

## 2020-07-26 NOTE — Telephone Encounter (Signed)
I called Mliss Sax, [place] nurse with [facility] and gave verbal order for patient to be removed off isolation.  Per Mliss Sax patient was already told she was off isolation, otherwise she wouldn't of been able to see Dr.Reed in clinic yesterday.   Spoke with patient, patient states no one has told her she was off isolation and that is why she was unable to receive cleaning services. Patient was glad to know for certain that is no longer on isolation.

## 2020-07-30 ENCOUNTER — Telehealth: Payer: Self-pay

## 2020-07-30 NOTE — Telephone Encounter (Signed)
Patient is coming in on Wednesday to sign opoid agreement

## 2020-07-30 NOTE — Telephone Encounter (Signed)
-----   Message from Gayland Curry, DO sent at 07/28/2020  6:31 PM EDT ----- Regarding: non opioid contract We forgot to do the nonopioid controlled substance contract for her (I forgot with her coughing all over and trying to figure out if she could leave isolation).  Could we do one and ask her to come over Wednesday and sign it, please?  I doubt she'll mind if she's not out and about somewhere.

## 2020-08-14 ENCOUNTER — Encounter: Payer: Self-pay | Admitting: Orthopaedic Surgery

## 2020-08-14 ENCOUNTER — Ambulatory Visit (INDEPENDENT_AMBULATORY_CARE_PROVIDER_SITE_OTHER): Payer: Medicare Other

## 2020-08-14 ENCOUNTER — Ambulatory Visit (INDEPENDENT_AMBULATORY_CARE_PROVIDER_SITE_OTHER): Payer: Medicare Other | Admitting: Orthopaedic Surgery

## 2020-08-14 VITALS — BP 121/76 | HR 66 | Ht 64.5 in | Wt 190.0 lb

## 2020-08-14 DIAGNOSIS — G8929 Other chronic pain: Secondary | ICD-10-CM

## 2020-08-14 DIAGNOSIS — M545 Low back pain, unspecified: Secondary | ICD-10-CM

## 2020-08-14 NOTE — Progress Notes (Signed)
Office Visit Note   Patient: Linda [name] [id number]           Date of Birth: 22-Apr-1932           MRN: [id number] Visit Date: 08/14/2020              Requested by: Gayland Curry, DO [place],  [place] [address] PCP: Gayland Curry, DO   Assessment & Plan: Visit Diagnoses:  1. Chronic bilateral low back pain, unspecified whether sciatica present     Plan: We will refer patient for repeat epidural with Dr. Ernestina Patches single injection lumbar as before.  She got relief for many months until recurrence 1 month ago.  If she does not get relief with the injection she can return for follow-up with me.  Follow-Up Instructions: Return if symptoms worsen or fail to improve.   Orders:  Orders Placed This Encounter  Procedures  . XR Lumbar Spine 2-3 Views  . Ambulatory referral to Physical Medicine Rehab   No orders of the defined types were placed in this encounter.     Procedures: No procedures performed   Clinical Data: No additional findings.   Subjective: Chief Complaint  Patient presents with  . Lower Back - Pain    HPI 86 year old female seen with back pain left leg pain that radiates down just below the knee.  She states that she walks in a forward flexed position due to pain.  No numbness or tingling in the foot.  Symptoms been present for more than a month and she states previously epidural injection gave her good relief.  Patient had previous acetabular fracture on the right with some sclerotic healing and has had work-up negative for malignancy with PET scan abdominal CT scan etc.  Previous excellent relief with the epidural by Dr. Ernestina Patches done 11/17/2018.  She is here today requesting a repeat injection.  Review of Systems all other systems are negative other than as mentioned in HPI.   Objective: Vital Signs: BP 121/76   Pulse 66   Ht 5' 4.5" (1.638 m)   Wt 190 lb (86.2 kg)   BMI 32.11 kg/m   Physical Exam Constitutional:      Appearance: She is  well-developed.  HENT:     Head: Normocephalic.     Right Ear: External ear normal.     Left Ear: External ear normal.  Eyes:     Pupils: Pupils are equal, round, and reactive to light.  Neck:     Thyroid: No thyromegaly.     Trachea: No tracheal deviation.  Cardiovascular:     Rate and Rhythm: Normal rate.  Pulmonary:     Effort: Pulmonary effort is normal.  Abdominal:     Palpations: Abdomen is soft.  Skin:    General: Skin is warm and dry.  Neurological:     Mental Status: She is alert and oriented to person, place, and time.  Psychiatric:        Behavior: Behavior normal.     Ortho Exam patient has negative logroll of the hips.  Prostatic notch tenderness.  Anterior tib EHL is strong.  Good gastrocsoleus strength right and left.  Specialty Comments:  No specialty comments available.  Imaging: XR Lumbar Spine 2-3 Views  Result Date: 08/14/2020 AP lateral lumbar spine x-rays are obtained and reviewed.  Left pelvis clips from previous GYN surgery.  Negative for acute lumbar changes.  Hip joint and pelvis shows no acute changes.  1  mm to 2 mm anterolisthesis L4-5. Impression: L4-5 degenerative anterolisthesis, stable.    PMFS History: Patient Active Problem List   Diagnosis Date Noted  . Rash 12/15/2019  . Venous insufficiency of both lower extremities 07/15/2019  . Environmental allergies 02/23/2019  . Osteoporosis with pathological fracture 02/23/2019  . Senile purpura ([place]) 02/23/2019  . History of uterine cancer 01/19/2019  . Family history of uterine cancer   . Family history of kidney cancer   . Family history of lung cancer   . Fracture of right acetabulum ([place]) 01/11/2019  . Chronic constipation with overflow incontinence 12/31/2018  . Physical deconditioning 12/31/2018  . Spinal stenosis of lumbar region without neurogenic claudication 12/03/2018  . Lumbar foraminal stenosis 12/03/2018  . 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 Seasonal allergies  10/20/2018  . Trochanteric bursitis, right hip 09/14/2018  . Chronic bilateral low back pain 09/14/2018  . Hoarseness of voice 04/08/2018  . Hypertension, essential, benign 01/16/2018  . Sensorineural hearing loss (SNHL) of both ears 01/16/2018  . Insomnia 01/16/2018  . MALT lymphoma ([place]) 01/15/2018  . 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 Osteopenia of both hips 01/15/2018  . Hyperlipidemia 01/15/2018  . DJD (degenerative joint disease) of knee 02/13/2011  . H/O difficult intubation 02/12/2011  . Dry eye syndrome 04/10/2010  . Incomplete emptying of bladder 08/31/2009  . Borderline glaucoma with ocular hypertension 09/07/2008   Past Medical History:  Diagnosis Date  . Allergy   . Anxiety   . Arthritis   . Bursitis    right leg  . Family history of kidney cancer   . Family history of lung cancer   . Family history of uterine cancer   . GERD (gastroesophageal reflux disease)   . History of blood transfusion   . History of uterine cancer 01/19/2019  . Hyperlipidemia   . Hypertension   . Insomnia   . Non Hodgkin's lymphoma ([place]) 2017-2019   cured  . Osteopenia    MILD  . Urine retention   . Uterine cancer ([place])    at age 74    Family History  Problem Relation Age of Onset  . Uterine cancer Sister 33       uterine cancer  . COPD Maternal Grandfather   . Lung cancer Maternal Grandfather   . Heart failure Mother 42  . Pneumonia Father 71  . Dementia Father   . Uterine cancer Maternal Grandmother 62  . Other Sister        Guillain Barre syndrom  . Kidney cancer Son 28  . Dementia Paternal Grandmother   . Lung cancer Maternal Uncle   . Other Son        Familial Hypercholesterolemia  . Colon cancer Neg Hx   . Esophageal cancer Neg Hx   . Liver cancer Neg Hx   . Pancreatic cancer Neg Hx   . Rectal cancer Neg Hx   . Stomach cancer Neg Hx     Past Surgical History:  Procedure Laterality Date  . ABDOMINAL HYSTERECTOMY     AGE 46  . APPENDECTOMY      AGE 44  . BLADDER SURGERY    . Catarct Surgery    . ECTOPIC PREGNANCY SURGERY     age 61  . LAPAROSCOPIC SMALL BOWEL RESECTION    . NM PET DX LYMPHOMA  10/2018   IN REMISSION  . occular pressure    . OTHER SURGICAL HISTORY    . REPLACEMENT TOTAL KNEE BILATERAL    .  TONSILLECTOMY AND ADENOIDECTOMY     age 66 or 55  . UTERINE CANCER SURGERY     age 79- located in vagina   Social History   Occupational History  . Occupation: retired  Tobacco Use  . Smoking status: Never Smoker  . Smokeless tobacco: Never Used  Vaping Use  . Vaping Use: Never used  Substance and Sexual Activity  . Alcohol use: Yes    Alcohol/week: 7.0 standard drinks    Types: 7 Glasses of wine per week    Comment: one glass of wine with dinner  . Drug use: No  . Sexual activity: Never

## 2020-08-15 ENCOUNTER — Telehealth: Payer: Self-pay

## 2020-08-15 ENCOUNTER — Other Ambulatory Visit: Payer: Self-pay | Admitting: Physical Medicine and Rehabilitation

## 2020-08-15 DIAGNOSIS — F411 Generalized anxiety disorder: Secondary | ICD-10-CM

## 2020-08-15 MED ORDER — DIAZEPAM 5 MG PO TABS: ORAL_TABLET | ORAL | 0 refills | Status: DC

## 2020-08-15 NOTE — Telephone Encounter (Signed)
Pt has been sch 08/29/20.

## 2020-08-15 NOTE — Progress Notes (Signed)
Pre-procedure diazepam ordered for pre-operative anxiety.  

## 2020-08-15 NOTE — Telephone Encounter (Signed)
Referral was just placed yesterday. We will call the patient when reviewed.

## 2020-08-15 NOTE — Telephone Encounter (Signed)
Patient called in to get sch with dr newton °

## 2020-08-15 NOTE — Telephone Encounter (Signed)
Patient called back to return call.  °

## 2020-08-26 ENCOUNTER — Other Ambulatory Visit: Payer: Self-pay | Admitting: Internal Medicine

## 2020-08-29 ENCOUNTER — Ambulatory Visit (INDEPENDENT_AMBULATORY_CARE_PROVIDER_SITE_OTHER): Payer: Medicare Other | Admitting: Physical Medicine and Rehabilitation

## 2020-08-29 ENCOUNTER — Ambulatory Visit: Payer: Self-pay

## 2020-08-29 ENCOUNTER — Encounter: Payer: Self-pay | Admitting: Physical Medicine and Rehabilitation

## 2020-08-29 ENCOUNTER — Other Ambulatory Visit: Payer: Self-pay

## 2020-08-29 VITALS — BP 100/58 | HR 51

## 2020-08-29 DIAGNOSIS — M5416 Radiculopathy, lumbar region: Secondary | ICD-10-CM

## 2020-08-29 MED ORDER — METHYLPREDNISOLONE ACETATE 80 MG/ML IJ SUSP
80.0000 mg | Freq: Once | INTRAMUSCULAR | Status: AC
  Administered 2020-08-29: 80 mg

## 2020-08-29 NOTE — Progress Notes (Signed)
Pt state lower back pain that travels down her left leg to her ankle. Pt state walking and standing makes the pain worse. Pt state she didn't feel steady. Pt state using a walker helps her walk better. Pt has hx of inj on 10/17/19 Pt state the inj was wonderful. Pt state her pain return in late July.  Numeric Pain Rating Scale and Functional Assessment Average Pain 4   In the last MONTH (on 0-10 scale) has pain interfered with the following?  1. General activity like being  able to carry out your everyday physical activities such as walking, climbing stairs, carrying groceries, or moving a chair?  Rating(7)   +Driver, -BT, -Dye Allergies.

## 2020-09-09 NOTE — Progress Notes (Signed)
Takiah Maiden [id number] - 86 y.o. female MRN [id number]  Date of birth: 26-Jul-1932  Office Visit Note: Visit Date: 08/29/2020 PCP: Gayland Curry, DO Referred by: Gayland Curry, DO  Subjective: Chief Complaint  Patient presents with  . Lower Back - Pain   HPI:  AVARAE ZWART is a 86 y.o. female who comes in today at the request of Dr. Rodell Perna for planned Left L4-L5 Lumbar epidural steroid injection with fluoroscopic guidance.  The patient has failed conservative care including home exercise, medications, time and activity modification.  This injection will be diagnostic and hopefully therapeutic.  Please see requesting physician notes for further details and justification.  MRI reviewed with images and spine model.  MRI reviewed in the note below.  She had a prior right L4 transforaminal injection in November 2020 with really good relief of her symptoms up until recently.  She had reevaluation by Dr. Lorin Mercy to the fact that this really is more left-sided complaints now.  She does have bilateral pathology at L4-5.  We will complete left L4 transforaminal injection.  Depending on relief would look at L5 transforaminal injection or different level.   ROS Otherwise per HPI.  Assessment & Plan: Visit Diagnoses:  1. Lumbar radiculopathy     Plan: No additional findings.   Meds & Orders:  Meds ordered this encounter  Medications  . methylPREDNISolone acetate (DEPO-MEDROL) injection 80 mg    Orders Placed This Encounter  Procedures  . XR C-ARM NO REPORT  . Epidural Steroid injection    Follow-up: Return for visit to requesting physician as needed.   Procedures: No procedures performed  Lumbosacral Transforaminal Epidural Steroid Injection - Sub-Pedicular Approach with Fluoroscopic Guidance  Patient: Larkin Alfred [id number]      Date of Birth: January 06, 1932 MRN: [id number] PCP: Gayland Curry, DO      Visit Date: 08/29/2020   Universal Protocol:    Date/Time: 08/29/2020  Consent  Given By: the patient  Position: PRONE  Additional Comments: Vital signs were monitored before and after the procedure. Patient was prepped and draped in the usual sterile fashion. The correct patient, procedure, and site was verified.   Injection Procedure Details:  Procedure Site One Meds Administered:  Meds ordered this encounter  Medications  . methylPREDNISolone acetate (DEPO-MEDROL) injection 80 mg    Laterality: Left  Location/Site:  L4-L5  Needle size: 22 G  Needle type: Spinal  Needle Placement: Transforaminal  Findings:    -Comments: Excellent flow of contrast along the nerve, nerve root and into the epidural space.  Procedure Details: After squaring off the end-plates to get a true AP view, the C-arm was positioned so that an oblique view of the foramen as noted above was visualized. The target area is just inferior to the "nose of the scotty dog" or sub pedicular. The soft tissues overlying this structure were infiltrated with 2-3 ml. of 1% Lidocaine without Epinephrine.  The spinal needle was inserted toward the target using a "trajectory" view along the fluoroscope beam.  Under AP and lateral visualization, the needle was advanced so it did not puncture dura and was located close the 6 O'Clock position of the pedical in AP tracterory. Biplanar projections were used to confirm position. Aspiration was confirmed to be negative for CSF and/or blood. A 1-2 ml. volume of Isovue-250 was injected and flow of contrast was noted at each level. Radiographs were obtained for documentation purposes.   After attaining the desired flow of contrast documented  above, a 0.5 to 1.0 ml test dose of 0.25% Marcaine was injected into each respective transforaminal space.  The patient was observed for 90 seconds post injection.  After no sensory deficits were reported, and normal lower extremity motor function was noted,   the above injectate was administered so that equal amounts of the  injectate were placed at each foramen (level) into the transforaminal epidural space.   Additional Comments:  The patient tolerated the procedure well Dressing: 2 x 2 sterile gauze and Band-Aid    Post-procedure details: Patient was observed during the procedure. Post-procedure instructions were reviewed.  Patient left the clinic in stable condition.      Clinical History: MRI LUMBAR SPINE WITHOUT CONTRAST  TECHNIQUE: Multiplanar, multisequence MR imaging of the lumbar spine was performed. No intravenous contrast was administered.  COMPARISON:  Radiography 09/14/2018  FINDINGS: Segmentation:  5 lumbar type vertebral bodies  Alignment: Mild dextrocurvature. Grade 1 anterolisthesis at L4-5 and slight retrolisthesis at L5-S1  Vertebrae:  No fracture, evidence of discitis, or bone lesion.  Conus medullaris and cauda equina: Conus extends to the L1 level. Conus and cauda equina appear normal.  Paraspinal and other soft tissues: Negative  Disc levels:  T12- L1: Shallow protrusion on sagittal imaging.  No impingement  L1-L2: Disc narrowing and bulging.  Negative facets.  No impingement  L2-L3: Advanced disc narrowing with mild bulging and endplate ridging. Mild posterior element hypertrophy. No impingement  L3-L4: Disc narrowing and bulging. Degenerative facet spurring. Patent spinal canal. There is moderate right foraminal narrowing. Patent left foramen.  L4-L5: Advanced facet arthropathy with spurring and anterolisthesis. Mild to moderate spinal stenosis. Bilateral subarticular recess effacement but the descending L5 nerve roots are medial and non-compressed. Noncompressive bilateral foraminal narrowing  L5-S1:Disc narrowing and ridging with bulge. Mild facet spurring. Moderate to advanced right foraminal stenosis. Noncompressive left foraminal narrowing.  IMPRESSION: 1. Diffuse disc and facet degeneration with L4-5 anterolisthesis. 2. L4-5  mild to moderate spinal stenosis. 3. Diffuse foraminal narrowing that is moderate to advanced on the right at L5-S1 and moderate on the right at L3-4.   Electronically Signed   By: Monte Fantasia M.D.   On: 10/21/2018 15:45     Objective:  VS:  HT:    WT:   BMI:     BP:(!) 100/58  HR:(!) 51bpm  TEMP: ( )  RESP:  Physical Exam Constitutional:      General: She is not in acute distress.    Appearance: Normal appearance. She is not ill-appearing.  HENT:     Head: Normocephalic and atraumatic.     Right Ear: External ear normal.     Left Ear: External ear normal.  Eyes:     Extraocular Movements: Extraocular movements intact.  Cardiovascular:     Rate and Rhythm: Normal rate.     Pulses: Normal pulses.  Musculoskeletal:     Right lower leg: No edema.     Left lower leg: No edema.     Comments: Patient has good distal strength with no pain over the greater trochanters.  No clonus or focal weakness.  Skin:    Findings: No erythema, lesion or rash.  Neurological:     General: No focal deficit present.     Mental Status: She is alert and oriented to person, place, and time.     Sensory: No sensory deficit.     Motor: No weakness or abnormal muscle tone.     Coordination: Coordination normal.  Psychiatric:  Mood and Affect: Mood normal.        Behavior: Behavior normal.      Imaging: No results found.

## 2020-09-09 NOTE — Procedures (Signed)
Lumbosacral Transforaminal Epidural Steroid Injection - Sub-Pedicular Approach with Fluoroscopic Guidance  Patient: Linda [name] YIRSWNIOE      Date of Birth: 1932/10/05 MRN: [id number] PCP: Gayland Curry, DO      Visit Date: 08/29/2020   Universal Protocol:    Date/Time: 08/29/2020  Consent Given By: the patient  Position: PRONE  Additional Comments: Vital signs were monitored before and after the procedure. Patient was prepped and draped in the usual sterile fashion. The correct patient, procedure, and site was verified.   Injection Procedure Details:  Procedure Site One Meds Administered:  Meds ordered this encounter  Medications  . methylPREDNISolone acetate (DEPO-MEDROL) injection 80 mg    Laterality: Left  Location/Site:  L4-L5  Needle size: 22 G  Needle type: Spinal  Needle Placement: Transforaminal  Findings:    -Comments: Excellent flow of contrast along the nerve, nerve root and into the epidural space.  Procedure Details: After squaring off the end-plates to get a true AP view, the C-arm was positioned so that an oblique view of the foramen as noted above was visualized. The target area is just inferior to the "nose of the scotty dog" or sub pedicular. The soft tissues overlying this structure were infiltrated with 2-3 ml. of 1% Lidocaine without Epinephrine.  The spinal needle was inserted toward the target using a "trajectory" view along the fluoroscope beam.  Under AP and lateral visualization, the needle was advanced so it did not puncture dura and was located close the 6 O'Clock position of the pedical in AP tracterory. Biplanar projections were used to confirm position. Aspiration was confirmed to be negative for CSF and/or blood. A 1-2 ml. volume of Isovue-250 was injected and flow of contrast was noted at each level. Radiographs were obtained for documentation purposes.   After attaining the desired flow of contrast documented above, a 0.5 to 1.0 ml test  dose of 0.25% Marcaine was injected into each respective transforaminal space.  The patient was observed for 90 seconds post injection.  After no sensory deficits were reported, and normal lower extremity motor function was noted,   the above injectate was administered so that equal amounts of the injectate were placed at each foramen (level) into the transforaminal epidural space.   Additional Comments:  The patient tolerated the procedure well Dressing: 2 x 2 sterile gauze and Band-Aid    Post-procedure details: Patient was observed during the procedure. Post-procedure instructions were reviewed.  Patient left the clinic in stable condition.

## 2020-09-13 DIAGNOSIS — L821 Other seborrheic keratosis: Secondary | ICD-10-CM | POA: Diagnosis not present

## 2020-09-13 DIAGNOSIS — D692 Other nonthrombocytopenic purpura: Secondary | ICD-10-CM | POA: Diagnosis not present

## 2020-09-13 DIAGNOSIS — D1801 Hemangioma of skin and subcutaneous tissue: Secondary | ICD-10-CM | POA: Diagnosis not present

## 2020-09-13 DIAGNOSIS — L573 Poikiloderma of Civatte: Secondary | ICD-10-CM | POA: Diagnosis not present

## 2020-09-13 DIAGNOSIS — Z85828 Personal history of other malignant neoplasm of skin: Secondary | ICD-10-CM | POA: Diagnosis not present

## 2020-09-13 DIAGNOSIS — L918 Other hypertrophic disorders of the skin: Secondary | ICD-10-CM | POA: Diagnosis not present

## 2020-10-01 ENCOUNTER — Telehealth: Payer: Self-pay | Admitting: *Deleted

## 2020-10-01 DIAGNOSIS — M25551 Pain in right hip: Secondary | ICD-10-CM | POA: Diagnosis not present

## 2020-10-01 DIAGNOSIS — M25552 Pain in left hip: Secondary | ICD-10-CM | POA: Diagnosis not present

## 2020-10-01 DIAGNOSIS — M545 Low back pain, unspecified: Secondary | ICD-10-CM | POA: Diagnosis not present

## 2020-10-01 NOTE — Telephone Encounter (Signed)
[facility] Nurse called and stated that patient fell in the hallway. Patient is now complaining of back pain. Nurse is wanting a verbal order for a mobile x-ray to check her back.  Please Advise.

## 2020-10-01 NOTE — Telephone Encounter (Signed)
I responded to their hipaa compliant tiger text message about this just now and ordered lumbar, pelvic and bilateral hip portable xrays.  Thanks.

## 2020-10-01 NOTE — Telephone Encounter (Signed)
[facility] Nurse Notified and agreed.

## 2020-10-02 ENCOUNTER — Telehealth: Payer: Self-pay | Admitting: Internal Medicine

## 2020-10-02 NOTE — Telephone Encounter (Signed)
°  Dr. Mariea Clonts Patient called and stated that she fell on 10/01/20.  She states she had only bruising and no broken bones.  She states that [place] asked her if she wanted a muscle relaxer.  Patient now is taking Tylendol 500mg  (three times and Ibupropen  400mg  (every six hours).  She is scheduled to see you at [facility] tomorrow.   Patient was needs clarification from you regarding the muscle relaxer.  Thank you  [employer]

## 2020-10-02 NOTE — Telephone Encounter (Signed)
Ok I had been notified about her fall and ordered xrays.  we'll address the muscle relaxer idea at her appt.

## 2020-10-03 ENCOUNTER — Non-Acute Institutional Stay: Payer: Medicare Other | Admitting: Internal Medicine

## 2020-10-03 ENCOUNTER — Encounter: Payer: Self-pay | Admitting: Internal Medicine

## 2020-10-03 ENCOUNTER — Other Ambulatory Visit: Payer: Self-pay

## 2020-10-03 VITALS — BP 110/62 | HR 60 | Temp 97.7°F | Ht 65.0 in | Wt 187.0 lb

## 2020-10-03 DIAGNOSIS — M8000XS Age-related osteoporosis with current pathological fracture, unspecified site, sequela: Secondary | ICD-10-CM | POA: Diagnosis not present

## 2020-10-03 DIAGNOSIS — R339 Retention of urine, unspecified: Secondary | ICD-10-CM

## 2020-10-03 DIAGNOSIS — M48061 Spinal stenosis, lumbar region without neurogenic claudication: Secondary | ICD-10-CM | POA: Diagnosis not present

## 2020-10-03 DIAGNOSIS — I872 Venous insufficiency (chronic) (peripheral): Secondary | ICD-10-CM

## 2020-10-03 DIAGNOSIS — R2689 Other abnormalities of gait and mobility: Secondary | ICD-10-CM | POA: Diagnosis not present

## 2020-10-03 DIAGNOSIS — K5909 Other constipation: Secondary | ICD-10-CM | POA: Diagnosis not present

## 2020-10-03 DIAGNOSIS — M791 Myalgia, unspecified site: Secondary | ICD-10-CM | POA: Diagnosis not present

## 2020-10-03 DIAGNOSIS — Z9181 History of falling: Secondary | ICD-10-CM

## 2020-10-03 NOTE — Progress Notes (Signed)
Location:   [place] of Service:  Clinic (12)  Provider: Pariss Hommes L. Mariea Clonts, D.O., C.M.D.  Code Status: FULL CODE--no DNR on file Goals of Care:  Advanced Directives 07/25/2020  Does Patient Have a Medical Advance Directive? Yes  Type of Advance Directive Out of facility DNR (pink MOST or yellow form)  Does patient want to make changes to medical advance directive? No - Guardian declined  Copy of Mil[place] in Chart? -   Chief Complaint  Patient presents with  . Acute Visit    Fall on 10/18/2021bruised everything, but her right side shows it, depressed and feeling down and shes in pain. concearned about weight loss down 10lbs from august     HPI: Patient is a 86 y.o. female with h/o MALT lymphoma, HTN, venous insufficiency, colitis from radiation, uterine ca, incomplete bladder emptying, hyperlipidemia, chronic low back pain with anterolisthesis, glaucoma, dry eyes, right hip fx, chronic constipation, senile osteoporosis  seen today for acute visit s/p fall.  She's been having pain since, using ibuprofen on an empty stomach and now has been nauseous and required zofran last night.    She fell 10/18 at noon right across the hall from her door, putting something on the ledge and she reached and put her foot out and she fell down.  She turned to get her butt as much as possible to hit the ground first.  She had her hand out.  She lower herself to the floor so she slide down the neighbor's door and knocked on their door and her door stayed opened.  They call security and she was lifted into bed and [place], [place], saw her.  She's bene on extra strength tylenol and ibuprofen 400mg  every 6 hrs.  She was also eating very little.  She was driking tea and water, but appetite poor.  Began vomiting yestreday.  She had an aide there.  It was bright yellow vomit.  She had that happen beofre where she woun up having blood later, but not this time.  She's had aids coming.   She hurts everywhere even rolling.  It's so painful she cannot do it.  She's requiring help to roll to the right.  When in her recliner or sitting on the bed, she could sit up, but was not stable to walk 10 feet--aids have been walking her to the restroom.  [name] got her a walker yesterday.  She also had not used that before--rollator with handbrakes.   There are gaps in availability with home care.  She cannot get up on her own if someone is not there.    The epidural did work pretty well about 3-4 wks ago.  This is her third in three years.  Gone about every 9 months.  Dr. Lorin Mercy had said she should be ok with these unless she moves it--L4-5 degenerative anterolisthesis.    Right side is still very sore.    Past Medical History:  Diagnosis Date  . Allergy   . Anxiety   . Arthritis   . Bursitis    right leg  . Family history of kidney cancer   . Family history of lung cancer   . Family history of uterine cancer   . GERD (gastroesophageal reflux disease)   . History of blood transfusion   . History of uterine cancer 01/19/2019  . Hyperlipidemia   . Hypertension   . Insomnia   . Non Hodgkin's lymphoma ([place]) 2017-2019  cured  . Osteopenia    MILD  . Urine retention   . Uterine cancer ([place])    at age 43    Past Surgical History:  Procedure Laterality Date  . ABDOMINAL HYSTERECTOMY     AGE 71  . APPENDECTOMY     AGE 52  . BLADDER SURGERY    . Catarct Surgery    . ECTOPIC PREGNANCY SURGERY     age 45  . LAPAROSCOPIC SMALL BOWEL RESECTION    . NM PET DX LYMPHOMA  10/2018   IN REMISSION  . occular pressure    . OTHER SURGICAL HISTORY    . REPLACEMENT TOTAL KNEE BILATERAL    . TONSILLECTOMY AND ADENOIDECTOMY     age 89 or 34  . UTERINE CANCER SURGERY     age 71- located in vagina    No Known Allergies  Outpatient Encounter Medications as of 10/03/2020  Medication Sig  . Ascorbic Acid (VITAMIN C) 1000 MG tablet Take 1,000 mg by mouth daily.  . bimatoprost (LUMIGAN) 0.01  % SOLN Place 1 drop into both eyes at bedtime.  . brimonidine (ALPHAGAN) 0.15 % ophthalmic solution Place 1 drop into both eyes 3 (three) times daily.  . cetirizine (ZYRTEC) 10 MG tablet Take 10 mg by mouth at bedtime.   . Cholecalciferol (VITAMIN D3) 50 MCG (2000 UT) CHEW Chew 1 capsule by mouth daily.  . eszopiclone (LUNESTA) 2 MG TABS tablet TAKE 1 TABLET AT BEDTIME ASNEEDED FOR SLEEP. TAKE     IMMEDIATELY BEFORE BEDTIME.  Marland Kitchen losartan (COZAAR) 50 MG tablet TAKE 1 TABLET DAILY  . nitrofurantoin (MACRODANTIN) 50 MG capsule Take 1 capsule (50 mg total) by mouth daily.  . ondansetron (ZOFRAN) 4 MG tablet Take 4 mg by mouth every 8 (eight) hours as needed for nausea or vomiting.  . Oxymetazoline HCl (NASAL SPRAY) 0.05 % SOLN Place into the nose.  Marland Kitchen Propylene Glycol (SYSTANE BALANCE) 0.6 % SOLN Apply 1 drop to eye daily as needed.  . psyllium (METAMUCIL) 58.6 % packet Take 1 packet by mouth daily.  . rosuvastatin (CRESTOR) 5 MG tablet TAKE 1 TABLET AT BEDTIME  . vitamin E 1000 UNIT capsule Take 1,000 Units by mouth daily.  Marland Kitchen zinc gluconate 50 MG tablet Take 50 mg by mouth daily.  . [DISCONTINUED] acetaminophen (TYLENOL) 500 MG tablet Take 500 mg by mouth every 6 (six) hours as needed.  . [DISCONTINUED] senna-docusate (SENOKOT-S) 8.6-50 MG tablet Take 1 tablet by mouth as needed for mild constipation.  . [DISCONTINUED] Dextromethorphan-guaiFENesin (TUSSIN DM) 10-100 MG/5ML liquid Take 5 mLs by mouth every 12 (twelve) hours. [employer] [facility]  . [DISCONTINUED] diazepam (VALIUM) 5 MG tablet Take 1 by mouth 1 hour  pre-procedure with very light food. May bring 2nd tablet to appointment.   No facility-administered encounter medications on file as of 10/03/2020.    Review of Systems:  Review of Systems  Constitutional: Negative for chills, fever and malaise/fatigue.  HENT: Positive for hearing loss.   Eyes:       Dry eyes  Respiratory: Negative for cough and shortness of breath.   Cardiovascular: Negative  for chest pain, palpitations and leg swelling.  Gastrointestinal: Positive for constipation. Negative for abdominal pain, blood in stool and melena.  Genitourinary: Negative for dysuria.  Musculoskeletal: Positive for back pain, falls, joint pain and myalgias.  Skin: Negative for itching and rash.  Neurological: Negative for dizziness, loss of consciousness and weakness.  Psychiatric/Behavioral: Negative for depression and memory loss. The  patient is not nervous/anxious and does not have insomnia.     Health Maintenance  Topic Date Due  . INFLUENZA VACCINE  07/15/2020  . DEXA SCAN  Completed  . COVID-19 Vaccine  Completed  . TETANUS/TDAP  Discontinued  . PNA vac Low Risk Adult  Discontinued    Physical Exam: Vitals:   10/03/20 1008  BP: 110/62  Pulse: 60  Temp: 97.7 F (36.5 C)  TempSrc: Temporal  SpO2: 96%  Weight: 187 lb (84.8 kg)  Height: 5\' 5"  (1.651 m)   Body mass index is 31.12 kg/m. Physical Exam Vitals reviewed.  Constitutional:      General: She is not in acute distress.    Appearance: Normal appearance. She is obese. She is not ill-appearing or toxic-appearing.  HENT:     Head: Normocephalic and atraumatic.     Right Ear: External ear normal.     Left Ear: External ear normal.     Ears:     Comments: Hearing aids    Nose: Nose normal.     Mouth/Throat:     Pharynx: Oropharynx is clear.  Eyes:     Extraocular Movements: Extraocular movements intact.     Conjunctiva/sclera: Conjunctivae normal.     Pupils: Pupils are equal, round, and reactive to light.     Comments: glasses  Cardiovascular:     Rate and Rhythm: Normal rate and regular rhythm.     Pulses: Normal pulses.     Heart sounds: Normal heart sounds.  Pulmonary:     Effort: Pulmonary effort is normal.     Breath sounds: Normal breath sounds. No wheezing, rhonchi or rales.  Abdominal:     General: Bowel sounds are normal. There is no distension.     Palpations: Abdomen is soft.      Tenderness: There is no abdominal tenderness. There is no guarding or rebound.  Musculoskeletal:        General: Normal range of motion.     Cervical back: Neck supple.     Right lower leg: No edema.     Left lower leg: No edema.     Comments: Unable to turn herself over on exam table due to pain or sit up independently; extremities all have full rom  Lymphadenopathy:     Cervical: No cervical adenopathy.  Skin:    General: Skin is warm and dry.     Comments: Ecchymoses right lower back/lower ribs  Neurological:     General: No focal deficit present.     Mental Status: She is alert and oriented to person, place, and time.     Motor: No weakness.     Gait: Gait abnormal.  Psychiatric:        Mood and Affect: Mood normal.        Behavior: Behavior normal.     Labs reviewed: Basic Metabolic Panel: Recent Labs    01/03/20 0500  NA 142  K 4.3  CL 106  CO2 28*  BUN 32*  CREATININE 0.5  CALCIUM 9.6   Liver Function Tests: Recent Labs    01/03/20 0500  ALBUMIN 4.5   No results for input(s): LIPASE, AMYLASE in the last 8760 hours. No results for input(s): AMMONIA in the last 8760 hours. CBC: Recent Labs    01/03/20 0500  WBC 4.6  HGB 13.2  HCT 40  PLT 176   Lipid Panel: Recent Labs    01/03/20 0000  CHOL 164  HDL 64  LDLCALC 76  TRIG 116    Assessment/Plan 1. Myalgia -severe on right side of body--right arm and lower back/posterior hip -xrays of bilateral hips and pelvis negative for fractures  -cannot turn herself over or get up independently due to pain -her son [name] and I agreed she needs to be in rehab to safely take meds for pain relief and to get some PT, OT to get moving again -Rxed tramadol and robaxin which I did not feel comfortable with her having back in her apt when she had some hours alone  2. H/O fall -braced herself and slid down the door and seems this made her right-sided muscle pains worse though she managed not to fracture  3.  Decreased mobility -due to poor mobility at this point, will transfer to rehab for PT, OT and pain mgt, also may use heat, ice, topicals for discomfort   4. Lumbar foraminal stenosis -had epidural with great benefit with Dr. Lorin Mercy, monitor  5. Chronic constipation with overflow incontinence -cont her usual regimen of two days of stool softener, then one day of miralax, she is not using fiber anymore  6. Venous insufficiency of both lower extremities -recommend compression hose if this becomes problematic  7. Incomplete emptying of bladder -longstanding issue for her, cont home routine  8. Osteoporosis with pathological fracture, sequela -noted previously, no active fractures, but high risk for more  Labs/tests ordered:  No new Next appt:  10/19/2020  Gerritt Galentine L. Thao Vanover, D.O. [facility] [address], [place] [address] Cell Phone (Mon-Fri 8am-5pm):  [phone] On Call:  [phone] & follow prompts after 5pm & weekends Office Phone:  [phone] Office Fax:  [phone]

## 2020-10-04 DIAGNOSIS — R2689 Other abnormalities of gait and mobility: Secondary | ICD-10-CM | POA: Diagnosis not present

## 2020-10-04 DIAGNOSIS — M810 Age-related osteoporosis without current pathological fracture: Secondary | ICD-10-CM | POA: Diagnosis not present

## 2020-10-04 DIAGNOSIS — Z9181 History of falling: Secondary | ICD-10-CM | POA: Diagnosis not present

## 2020-10-04 DIAGNOSIS — M48061 Spinal stenosis, lumbar region without neurogenic claudication: Secondary | ICD-10-CM | POA: Diagnosis not present

## 2020-10-04 DIAGNOSIS — R278 Other lack of coordination: Secondary | ICD-10-CM | POA: Diagnosis not present

## 2020-10-04 DIAGNOSIS — M6389 Disorders of muscle in diseases classified elsewhere, multiple sites: Secondary | ICD-10-CM | POA: Diagnosis not present

## 2020-10-05 MED ORDER — METHOCARBAMOL 500 MG PO TABS: 500.0000 mg | ORAL_TABLET | Freq: Four times a day (QID) | ORAL | 0 refills | Status: DC | PRN

## 2020-10-05 MED ORDER — TRAMADOL HCL 50 MG PO TABS
50.0000 mg | ORAL_TABLET | Freq: Four times a day (QID) | ORAL | 0 refills | Status: DC | PRN
Start: 2020-10-05 — End: 2020-10-09

## 2020-10-08 DIAGNOSIS — M810 Age-related osteoporosis without current pathological fracture: Secondary | ICD-10-CM | POA: Diagnosis not present

## 2020-10-08 DIAGNOSIS — M6389 Disorders of muscle in diseases classified elsewhere, multiple sites: Secondary | ICD-10-CM | POA: Diagnosis not present

## 2020-10-08 DIAGNOSIS — M48061 Spinal stenosis, lumbar region without neurogenic claudication: Secondary | ICD-10-CM | POA: Diagnosis not present

## 2020-10-08 DIAGNOSIS — Z9181 History of falling: Secondary | ICD-10-CM | POA: Diagnosis not present

## 2020-10-08 DIAGNOSIS — R2689 Other abnormalities of gait and mobility: Secondary | ICD-10-CM | POA: Diagnosis not present

## 2020-10-08 DIAGNOSIS — R278 Other lack of coordination: Secondary | ICD-10-CM | POA: Diagnosis not present

## 2020-10-09 ENCOUNTER — Non-Acute Institutional Stay (SKILLED_NURSING_FACILITY): Payer: Medicare Other | Admitting: Internal Medicine

## 2020-10-09 ENCOUNTER — Encounter: Payer: Self-pay | Admitting: Internal Medicine

## 2020-10-09 DIAGNOSIS — M6389 Disorders of muscle in diseases classified elsewhere, multiple sites: Secondary | ICD-10-CM | POA: Diagnosis not present

## 2020-10-09 DIAGNOSIS — K5903 Drug induced constipation: Secondary | ICD-10-CM | POA: Diagnosis not present

## 2020-10-09 DIAGNOSIS — R2689 Other abnormalities of gait and mobility: Secondary | ICD-10-CM

## 2020-10-09 DIAGNOSIS — Z9181 History of falling: Secondary | ICD-10-CM

## 2020-10-09 DIAGNOSIS — R339 Retention of urine, unspecified: Secondary | ICD-10-CM

## 2020-10-09 DIAGNOSIS — T402X5A Adverse effect of other opioids, initial encounter: Secondary | ICD-10-CM | POA: Diagnosis not present

## 2020-10-09 DIAGNOSIS — M48061 Spinal stenosis, lumbar region without neurogenic claudication: Secondary | ICD-10-CM | POA: Diagnosis not present

## 2020-10-09 DIAGNOSIS — M791 Myalgia, unspecified site: Secondary | ICD-10-CM

## 2020-10-09 DIAGNOSIS — M810 Age-related osteoporosis without current pathological fracture: Secondary | ICD-10-CM | POA: Diagnosis not present

## 2020-10-09 DIAGNOSIS — R278 Other lack of coordination: Secondary | ICD-10-CM | POA: Diagnosis not present

## 2020-10-09 NOTE — Progress Notes (Signed)
Location:  [facility] Room Number: 151 Place of Service:  ALF (13) Provider:  Konnor Jorden L. Mariea Clonts, D.O., C.M.D.  Gayland Curry, DO  Patient Care Team: Gayland Curry, DO as PCP - General (Geriatric Medicine) Rolm Bookbinder, MD as Consulting Physician (Dermatology) Marybelle Killings, MD as Consulting Physician (Orthopedic Surgery) Jola Schmidt, MD as Consulting Physician (Ophthalmology) Milus Banister, MD as Attending Physician (Gastroenterology) Regal, Tamala Fothergill, DPM as Consulting Physician (Podiatry) Lucas Mallow, MD as Consulting Physician (Urology) Jodi Marble, MD as Consulting Physician (Otolaryngology)  Extended Emergency Contact Information Primary Emergency Contact: [name] Address: [address]          [place], [place] [address] [name] of [place] Phone: [phone] Relation: Son Secondary Emergency Contact: [name] Address: [address] [place] [address] [name] of [place] Mobile Phone: [phone] Relation: Son  Code Status:  DNR Goals of care: Advanced Directive information Advanced Directives 10/09/2020  Does Patient Have a Medical Advance Directive? Yes  Type of Advance Directive Living will;Healthcare Power of Attorney  Does patient want to make changes to medical advance directive? No - Patient declined  Copy of Jal in Chart? Yes - validated most recent copy scanned in chart (See row information)   Chief Complaint  Patient presents with  . Acute Visit    Rehab/ Constipation     HPI:  Pt is a 86 y.o. female seen today for an acute visit for sluggish BMs while in rehab s/p fall.  Last week, she was admitted to rehab s/p fall with left side pain and negative xrays.  She has a h/o SBO in the past.  She was started on tramadol and robaxin pain regimen.  She's been getting PT, OT.  Unfortunately, she has been slow to get moving despite the  pain regimen.  Her intake has not been that good either.  She's also not been getting up to do her routine I/O caths every 2-3 hrs like she did at home.    So, today, she's reporting concerns about not having an adequate evacuation and urgency to her urination with cloudiness of the urine.  Discussed that her Jill Side symptoms are probably more due to not getting up and emptying her bladder properly than infection as she has no abdominal pain, dysuria or change in frequency.  She's afebrile.    As far as bowels, she'd had a suppository 10/22 with a small bm, MOM yesterday w/o any BM, then another supp  10/25 w/o results.  Discussed options and she now wants to take an enema.  She's been on miralax every 3rd day and senna s on the other days like at home.  She was able to get up and sit on the side of the bed though with pain that remains primarily on her right upper pelvis (PSIS area).   Past Medical History:  Diagnosis Date  . Allergy   . Anxiety   . Arthritis   . Bursitis    right leg  . Family history of kidney cancer   . Family history of lung cancer   . Family history of uterine cancer   . GERD (gastroesophageal reflux disease)   . History of blood transfusion   . History of uterine cancer 01/19/2019  . Hyperlipidemia   . Hypertension   . Insomnia   . Non Hodgkin's lymphoma ([place]) 2017-2019   cured  .  Osteopenia    MILD  . Urine retention   . Uterine cancer ([place])    at age 43   Past Surgical History:  Procedure Laterality Date  . ABDOMINAL HYSTERECTOMY     AGE 27  . APPENDECTOMY     AGE 61  . BLADDER SURGERY    . Catarct Surgery    . ECTOPIC PREGNANCY SURGERY     age 61  . LAPAROSCOPIC SMALL BOWEL RESECTION    . NM PET DX LYMPHOMA  10/2018   IN REMISSION  . occular pressure    . OTHER SURGICAL HISTORY    . REPLACEMENT TOTAL KNEE BILATERAL    . TONSILLECTOMY AND ADENOIDECTOMY     age 74 or 16  . UTERINE CANCER SURGERY     age 8- located in vagina    No Known  Allergies  Outpatient Encounter Medications as of 10/09/2020  Medication Sig  . Acetaminophen 500 MG capsule Take 500 mg by mouth 3 (three) times daily. 2 tabs  Three times daily  . bimatoprost (LUMIGAN) 0.01 % SOLN Place 1 drop into both eyes at bedtime.  . brimonidine (ALPHAGAN) 0.15 % ophthalmic solution Place 1 drop into both eyes 3 (three) times daily.  . cetirizine (ZYRTEC) 10 MG tablet Take 10 mg by mouth at bedtime.   . Cholecalciferol (VITAMIN D3) 50 MCG (2000 UT) CHEW Chew 1 capsule by mouth daily.  . eszopiclone (LUNESTA) 2 MG TABS tablet TAKE 1 TABLET AT BEDTIME ASNEEDED FOR SLEEP. TAKE     IMMEDIATELY BEFORE BEDTIME.  Marland Kitchen losartan (COZAAR) 50 MG tablet TAKE 1 TABLET DAILY  . magnesium hydroxide (MILK OF MAGNESIA) 400 MG/5ML suspension Take by mouth daily as needed for mild constipation.  . methocarbamol (ROBAXIN) 500 MG tablet Take 500 mg by mouth 4 (four) times daily.  . nitrofurantoin (MACRODANTIN) 50 MG capsule Take 1 capsule (50 mg total) by mouth daily.  . ondansetron (ZOFRAN) 4 MG tablet Take 4 mg by mouth every 8 (eight) hours as needed for nausea or vomiting.  . polyethylene glycol (MIRALAX / GLYCOLAX) 17 g packet Take 17 g by mouth daily.  Marland Kitchen Propylene Glycol (SYSTANE BALANCE) 0.6 % SOLN Apply 1 drop to eye daily as needed.  . rosuvastatin (CRESTOR) 5 MG tablet TAKE 1 TABLET AT BEDTIME  . sennosides-docusate sodium (SENOKOT-S) 8.6-50 MG tablet Take 1 tablet by mouth daily.  . traMADol (ULTRAM) 50 MG tablet Take by mouth every 6 (six) hours as needed.  . [DISCONTINUED] Ascorbic Acid (VITAMIN C) 1000 MG tablet Take 1,000 mg by mouth daily.  . [DISCONTINUED] methocarbamol (ROBAXIN) 500 MG tablet Take 1 tablet (500 mg total) by mouth every 6 (six) hours as needed for up to 14 days for muscle spasms.  . [DISCONTINUED] Oxymetazoline HCl (NASAL SPRAY) 0.05 % SOLN Place into the nose.  . [DISCONTINUED] psyllium (METAMUCIL) 58.6 % packet Take 1 packet by mouth daily.  .  [DISCONTINUED] traMADol (ULTRAM) 50 MG tablet Take 1 tablet (50 mg total) by mouth every 6 (six) hours as needed for up to 5 days for moderate pain or severe pain.  . [DISCONTINUED] vitamin E 1000 UNIT capsule Take 1,000 Units by mouth daily.  . [DISCONTINUED] zinc gluconate 50 MG tablet Take 50 mg by mouth daily.   No facility-administered encounter medications on file as of 10/09/2020.    Review of Systems  Constitutional: Negative for chills, fever and malaise/fatigue.  HENT: Positive for hearing loss.   Eyes: Positive for blurred vision.  Respiratory: Negative for cough and shortness of breath.   Cardiovascular: Negative for chest pain, palpitations and leg swelling.  Gastrointestinal: Positive for constipation. Negative for abdominal pain, blood in stool, diarrhea and melena.  Genitourinary: Positive for urgency. Negative for dysuria, flank pain, frequency and hematuria.       I/O caths self for retention but not following her normal routine  Musculoskeletal: Positive for back pain, joint pain and myalgias. Negative for falls.       No further falls  Skin: Negative for itching and rash.  Neurological: Negative for dizziness and loss of consciousness.  Psychiatric/Behavioral: Negative for depression and memory loss. The patient is not nervous/anxious and does not have insomnia.     Immunization History  Administered Date(s) Administered  . Influenza, High Dose Seasonal PF 09/26/2014, 08/17/2015, 08/19/2016, 08/20/2017, 08/24/2018, 09/23/2019  . Moderna SARS-COVID-2 Vaccination 12/27/2019, 01/24/2020  . Pneumococcal Conjugate-13 09/07/2014  . Pneumococcal Polysaccharide-23 09/21/2007  . Tdap 05/23/2010  . Zoster 10/24/2005  . Zoster Recombinat (Shingrix) 09/22/2017, 11/25/2017   Pertinent  Health Maintenance Due  Topic Date Due  . INFLUENZA VACCINE  07/15/2020  . DEXA SCAN  Completed  . PNA vac Low Risk Adult  Discontinued   Fall Risk  10/03/2020 07/25/2020 01/11/2020  12/15/2019 10/19/2019  Falls in the past year? 1 0 0 0 0  Number falls in past yr: 0 0 0 0 0  Injury with Fall? 1 0 0 0 0  Risk for fall due to : - - - - -  Follow up - - - - -   Functional Status Survey:    Vitals:   10/09/20 1122  BP: 126/81  Pulse: 67  Temp: (!) 97.1 F (36.2 C)  TempSrc: Temporal  SpO2: 94%  Weight: 191 lb 6.4 oz (86.8 kg)  Height: 5\' 4"  (1.626 m)   Body mass index is 32.85 kg/m. Physical Exam Constitutional:      General: She is not in acute distress.    Appearance: Normal appearance. She is obese. She is not toxic-appearing.  HENT:     Head: Normocephalic and atraumatic.  Cardiovascular:     Rate and Rhythm: Normal rate and regular rhythm.     Pulses: Normal pulses.     Heart sounds: Normal heart sounds.  Pulmonary:     Effort: Pulmonary effort is normal.     Breath sounds: No wheezing, rhonchi or rales.  Abdominal:     General: Bowel sounds are normal. There is no distension.     Palpations: Abdomen is soft.     Tenderness: There is no abdominal tenderness. There is no guarding or rebound.  Musculoskeletal:        General: Tenderness present. Normal range of motion.     Comments: Tender over right posterior superior iliac spine region and lower ribs, right hip  Skin:    General: Skin is warm and dry.     Comments: Yellow bruise on right knee  Neurological:     General: No focal deficit present.     Mental Status: She is alert and oriented to person, place, and time.     Motor: No weakness.     Comments: Uses walker and able to do that--walked out to nurses' station to come get me to look at her cloudy urine in her catheter  Psychiatric:        Mood and Affect: Mood normal.        Behavior: Behavior normal.     Labs reviewed:  Recent Labs    01/03/20 0500  NA 142  K 4.3  CL 106  CO2 28*  BUN 32*  CREATININE 0.5  CALCIUM 9.6   Recent Labs    01/03/20 0500  ALBUMIN 4.5   Recent Labs    01/03/20 0500  WBC 4.6  HGB 13.2   HCT 40  PLT 176   No results found for: TSH No results found for: HGBA1C Lab Results  Component Value Date   CHOL 164 01/03/2020   HDL 64 01/03/2020   LDLCALC 76 01/03/2020   TRIG 116 01/03/2020   CHOLHDL 3 08/24/2018     Assessment/Plan 1. Therapeutic opioid induced constipation -try fleets enema today, if not a success, give miralax and senna s daily going forward -encouraged hydration with water and getting up every 2-3 hrs to do her self-caths like at home which should help get things moving, eat fiber rich foods  2. H/O fall -w/o major injury but a lot of soreness -working with therapy   3. Myalgia -cont tramadol and robaxin but halfed doses of both -was getting the two drugs 6 hours apart even from each other alternating instead of 3 hrs apart as I'd intended so will try to do that now with a very detailed order that I explained to nursing verbally, as well  4. Incomplete emptying of bladder -must do her usual I/O caths to prevent sensation of urgency and to prevent overflow with leakage -she was insisting upon have UA c+s though I don't expect to find infection  5. Decreased mobility -continue to work with therapy and to walk on her own in her room as she's been released to do with her walker -cannot stay in bed all day--reminded her how she would feel with a flu or other illness after staying in bed  Family/ staff Communication: discussed with several nursing staff  Labs/tests ordered: UA c+s  Khamiya Varin L. Lilliana Turner, D.O. [facility] [address], [place] [address] Cell Phone (Mon-Fri 8am-5pm):  [phone] On Call:  [phone] & follow prompts after 5pm & weekends Office Phone:  [phone] Office Fax:  [phone]

## 2020-10-10 ENCOUNTER — Encounter: Payer: Self-pay | Admitting: Internal Medicine

## 2020-10-10 DIAGNOSIS — K5900 Constipation, unspecified: Secondary | ICD-10-CM | POA: Diagnosis not present

## 2020-10-10 DIAGNOSIS — R309 Painful micturition, unspecified: Secondary | ICD-10-CM | POA: Diagnosis not present

## 2020-10-11 DIAGNOSIS — R278 Other lack of coordination: Secondary | ICD-10-CM | POA: Diagnosis not present

## 2020-10-11 DIAGNOSIS — M810 Age-related osteoporosis without current pathological fracture: Secondary | ICD-10-CM | POA: Diagnosis not present

## 2020-10-11 DIAGNOSIS — Z9181 History of falling: Secondary | ICD-10-CM | POA: Diagnosis not present

## 2020-10-11 DIAGNOSIS — M48061 Spinal stenosis, lumbar region without neurogenic claudication: Secondary | ICD-10-CM | POA: Diagnosis not present

## 2020-10-11 DIAGNOSIS — M6389 Disorders of muscle in diseases classified elsewhere, multiple sites: Secondary | ICD-10-CM | POA: Diagnosis not present

## 2020-10-11 DIAGNOSIS — R2689 Other abnormalities of gait and mobility: Secondary | ICD-10-CM | POA: Diagnosis not present

## 2020-10-12 DIAGNOSIS — M48061 Spinal stenosis, lumbar region without neurogenic claudication: Secondary | ICD-10-CM | POA: Diagnosis not present

## 2020-10-12 DIAGNOSIS — Z9181 History of falling: Secondary | ICD-10-CM | POA: Diagnosis not present

## 2020-10-12 DIAGNOSIS — R278 Other lack of coordination: Secondary | ICD-10-CM | POA: Diagnosis not present

## 2020-10-12 DIAGNOSIS — M810 Age-related osteoporosis without current pathological fracture: Secondary | ICD-10-CM | POA: Diagnosis not present

## 2020-10-12 DIAGNOSIS — M6389 Disorders of muscle in diseases classified elsewhere, multiple sites: Secondary | ICD-10-CM | POA: Diagnosis not present

## 2020-10-12 DIAGNOSIS — R2689 Other abnormalities of gait and mobility: Secondary | ICD-10-CM | POA: Diagnosis not present

## 2020-10-15 DIAGNOSIS — R2689 Other abnormalities of gait and mobility: Secondary | ICD-10-CM | POA: Diagnosis not present

## 2020-10-15 DIAGNOSIS — M810 Age-related osteoporosis without current pathological fracture: Secondary | ICD-10-CM | POA: Diagnosis not present

## 2020-10-15 DIAGNOSIS — M48061 Spinal stenosis, lumbar region without neurogenic claudication: Secondary | ICD-10-CM | POA: Diagnosis not present

## 2020-10-15 DIAGNOSIS — Z9181 History of falling: Secondary | ICD-10-CM | POA: Diagnosis not present

## 2020-10-15 DIAGNOSIS — M6389 Disorders of muscle in diseases classified elsewhere, multiple sites: Secondary | ICD-10-CM | POA: Diagnosis not present

## 2020-10-15 DIAGNOSIS — R278 Other lack of coordination: Secondary | ICD-10-CM | POA: Diagnosis not present

## 2020-10-16 ENCOUNTER — Other Ambulatory Visit: Payer: Self-pay

## 2020-10-16 ENCOUNTER — Ambulatory Visit (INDEPENDENT_AMBULATORY_CARE_PROVIDER_SITE_OTHER): Payer: Medicare Other | Admitting: Orthopaedic Surgery

## 2020-10-16 ENCOUNTER — Ambulatory Visit: Payer: Self-pay

## 2020-10-16 ENCOUNTER — Telehealth: Payer: Self-pay

## 2020-10-16 ENCOUNTER — Encounter: Payer: Self-pay | Admitting: Orthopaedic Surgery

## 2020-10-16 VITALS — Ht 64.0 in | Wt 191.0 lb

## 2020-10-16 DIAGNOSIS — G8929 Other chronic pain: Secondary | ICD-10-CM

## 2020-10-16 DIAGNOSIS — R2689 Other abnormalities of gait and mobility: Secondary | ICD-10-CM | POA: Diagnosis not present

## 2020-10-16 DIAGNOSIS — M810 Age-related osteoporosis without current pathological fracture: Secondary | ICD-10-CM | POA: Diagnosis not present

## 2020-10-16 DIAGNOSIS — Z9181 History of falling: Secondary | ICD-10-CM | POA: Diagnosis not present

## 2020-10-16 DIAGNOSIS — M6389 Disorders of muscle in diseases classified elsewhere, multiple sites: Secondary | ICD-10-CM | POA: Diagnosis not present

## 2020-10-16 DIAGNOSIS — R278 Other lack of coordination: Secondary | ICD-10-CM | POA: Diagnosis not present

## 2020-10-16 DIAGNOSIS — M545 Low back pain, unspecified: Secondary | ICD-10-CM

## 2020-10-16 DIAGNOSIS — M48061 Spinal stenosis, lumbar region without neurogenic claudication: Secondary | ICD-10-CM | POA: Diagnosis not present

## 2020-10-16 MED ORDER — PREDNISONE 10 MG (21) PO TBPK: ORAL_TABLET | ORAL | 0 refills | Status: DC

## 2020-10-16 NOTE — Addendum Note (Signed)
Addended by: Precious Bard on: 10/16/2020 10:47 AM   Modules accepted: Orders

## 2020-10-16 NOTE — Telephone Encounter (Signed)
Patient son called in to get patient sch for epidural injec

## 2020-10-16 NOTE — Progress Notes (Signed)
Office Visit Note   Patient: Linda [name] [id number]           Date of Birth: 26-Jan-1932           MRN: [id number] Visit Date: 10/16/2020              Requested by: Gayland Curry, DO [place],  [place] [address] PCP: Gayland Curry, DO   Assessment & Plan: Visit Diagnoses:  1. Chronic bilateral low back pain, unspecified whether sciatica present     Plan: Impression is lumbar radiculopathy aggravated by fall and activity.  I have prescribed a prednisone Dosepak and I think that it might be worthwhile to try another ESI injection with Dr.Newton.  Referral has been made for this.  Activity as tolerated with PT and with walker.  Follow-up as needed.  Follow-Up Instructions: Return if symptoms worsen or fail to improve.   Orders:  Orders Placed This Encounter  Procedures  . XR Lumbar Spine 2-3 Views  . XR Pelvis 1-2 Views   Meds ordered this encounter  Medications  . predniSONE (STERAPRED UNI-PAK 21 TAB) 10 MG (21) TBPK tablet    Sig: Take as directed    Dispense:  21 tablet    Refill:  0      Procedures: No procedures performed   Clinical Data: No additional findings.   Subjective: Chief Complaint  Patient presents with  . Lower Back - Pain  . Right Leg - Pain    Ms. Linda [name] is a 86 year old female who is a mother of Linda [name] who is my neighbor who comes in for severe right leg and back pain.  Originally she slipped and fell 2 weeks ago onto her right side and initially she had low back pain but now it is radiating down the right leg.  She does have a history of lumbar spine issues and has had several lumbar spine ESI's by Dr. Ernestina Patches with good relief.  She denies any groin pain or numbness or tingling or bowel bladder dysfunction.  This past Friday she was able to ambulate with a walker with physical therapy but she had worsening pain over the weekend.   Review of Systems  Constitutional: Negative.   HENT: Negative.   Eyes: Negative.   Respiratory:  Negative.   Cardiovascular: Negative.   Endocrine: Negative.   Musculoskeletal: Negative.   Neurological: Negative.   Hematological: Negative.   Psychiatric/Behavioral: Negative.   All other systems reviewed and are negative.    Objective: Vital Signs: Ht 5\' 4"  (1.626 m)   Wt 191 lb (86.6 kg)   BMI 32.79 kg/m   Physical Exam Vitals and nursing note reviewed.  Constitutional:      Appearance: She is well-developed.  Pulmonary:     Effort: Pulmonary effort is normal.  Skin:    General: Skin is warm.     Capillary Refill: Capillary refill takes less than 2 seconds.  Neurological:     Mental Status: She is alert and oriented to person, place, and time.  Psychiatric:        Behavior: Behavior normal.        Thought Content: Thought content normal.        Judgment: Judgment normal.     Ortho Exam Lumbar spine shows tenderness to palpation more so on the right side in the paraspinous soft tissues.  Positive sciatic tension sign.  No focal motor or sensory deficits.  Hip exam is unremarkable. Specialty Comments:  No  specialty comments available.  Imaging: XR Lumbar Spine 2-3 Views  Result Date: 10/16/2020 Widespread lumbar spondylosis.  No acute abnormalities.  XR Pelvis 1-2 Views  Result Date: 10/16/2020 No acute or structural abnormalities    PMFS History: Patient Active Problem List   Diagnosis Date Noted  . Rash 12/15/2019  . Venous insufficiency of both lower extremities 07/15/2019  . Environmental allergies 02/23/2019  . Osteoporosis with pathological fracture 02/23/2019  . Senile purpura ([place]) 02/23/2019  . History of uterine cancer 01/19/2019  . Family history of uterine cancer   . Family history of kidney cancer   . Family history of lung cancer   . Fracture of right acetabulum ([place]) 01/11/2019  . Chronic constipation with overflow incontinence 12/31/2018  . Physical deconditioning 12/31/2018  . Spinal stenosis of lumbar region without neurogenic  claudication 12/03/2018  . Lumbar foraminal stenosis 12/03/2018  . 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 Seasonal allergies 10/20/2018  . Trochanteric bursitis, right hip 09/14/2018  . Chronic bilateral low back pain 09/14/2018  . Hoarseness of voice 04/08/2018  . Hypertension, essential, benign 01/16/2018  . Sensorineural hearing loss (SNHL) of both ears 01/16/2018  . Insomnia 01/16/2018  . MALT lymphoma ([place]) 01/15/2018  . 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 Osteopenia of both hips 01/15/2018  . Hyperlipidemia 01/15/2018  . DJD (degenerative joint disease) of knee 02/13/2011  . H/O difficult intubation 02/12/2011  . Dry eye syndrome 04/10/2010  . Incomplete emptying of bladder 08/31/2009  . Borderline glaucoma with ocular hypertension 09/07/2008   Past Medical History:  Diagnosis Date  . Allergy   . Anxiety   . Arthritis   . Bursitis    right leg  . Family history of kidney cancer   . Family history of lung cancer   . Family history of uterine cancer   . GERD (gastroesophageal reflux disease)   . History of blood transfusion   . History of uterine cancer 01/19/2019  . Hyperlipidemia   . Hypertension   . Insomnia   . Non Hodgkin's lymphoma ([place]) 2017-2019   cured  . Osteopenia    MILD  . Urine retention   . Uterine cancer ([place])    at age 89    Family History  Problem Relation Age of Onset  . Uterine cancer Sister 41       uterine cancer  . COPD Maternal Grandfather   . Lung cancer Maternal Grandfather   . Heart failure Mother 41  . Pneumonia Father 53  . Dementia Father   . Uterine cancer Maternal Grandmother 62  . Other Sister        Guillain Barre syndrom  . Kidney cancer Son 72  . Dementia Paternal Grandmother   . Lung cancer Maternal Uncle   . Other Son        Familial Hypercholesterolemia  . Colon cancer Neg Hx   . Esophageal cancer Neg Hx   . Liver cancer Neg Hx   . Pancreatic cancer Neg Hx   . Rectal cancer Neg Hx   . Stomach cancer  Neg Hx     Past Surgical History:  Procedure Laterality Date  . ABDOMINAL HYSTERECTOMY     AGE 56  . APPENDECTOMY     AGE 86  . BLADDER SURGERY    . Catarct Surgery    . ECTOPIC PREGNANCY SURGERY     age 48  . LAPAROSCOPIC SMALL BOWEL RESECTION    . NM PET DX LYMPHOMA  10/2018   IN REMISSION  . occular pressure    . OTHER SURGICAL HISTORY    . REPLACEMENT TOTAL KNEE BILATERAL    . TONSILLECTOMY AND ADENOIDECTOMY     age 30 or 49  . UTERINE CANCER SURGERY     age 31- located in vagina   Social History   Occupational History  . Occupation: retired  Tobacco Use  . Smoking status: Never Smoker  . Smokeless tobacco: Never Used  Vaping Use  . Vaping Use: Never used  Substance and Sexual Activity  . Alcohol use: Yes    Alcohol/week: 7.0 standard drinks    Types: 7 Glasses of wine per week    Comment: one glass of wine with dinner  . Drug use: No  . Sexual activity: Never

## 2020-10-17 DIAGNOSIS — M48061 Spinal stenosis, lumbar region without neurogenic claudication: Secondary | ICD-10-CM | POA: Diagnosis not present

## 2020-10-17 DIAGNOSIS — Z9181 History of falling: Secondary | ICD-10-CM | POA: Diagnosis not present

## 2020-10-17 DIAGNOSIS — M810 Age-related osteoporosis without current pathological fracture: Secondary | ICD-10-CM | POA: Diagnosis not present

## 2020-10-17 DIAGNOSIS — R2689 Other abnormalities of gait and mobility: Secondary | ICD-10-CM | POA: Diagnosis not present

## 2020-10-17 DIAGNOSIS — R278 Other lack of coordination: Secondary | ICD-10-CM | POA: Diagnosis not present

## 2020-10-17 DIAGNOSIS — M6389 Disorders of muscle in diseases classified elsewhere, multiple sites: Secondary | ICD-10-CM | POA: Diagnosis not present

## 2020-10-17 NOTE — Telephone Encounter (Signed)
Called pt son and sch 11/29

## 2020-10-18 DIAGNOSIS — M6389 Disorders of muscle in diseases classified elsewhere, multiple sites: Secondary | ICD-10-CM | POA: Diagnosis not present

## 2020-10-18 DIAGNOSIS — M810 Age-related osteoporosis without current pathological fracture: Secondary | ICD-10-CM | POA: Diagnosis not present

## 2020-10-18 DIAGNOSIS — Z9181 History of falling: Secondary | ICD-10-CM | POA: Diagnosis not present

## 2020-10-18 DIAGNOSIS — R2689 Other abnormalities of gait and mobility: Secondary | ICD-10-CM | POA: Diagnosis not present

## 2020-10-18 DIAGNOSIS — R278 Other lack of coordination: Secondary | ICD-10-CM | POA: Diagnosis not present

## 2020-10-18 DIAGNOSIS — M48061 Spinal stenosis, lumbar region without neurogenic claudication: Secondary | ICD-10-CM | POA: Diagnosis not present

## 2020-10-19 ENCOUNTER — Encounter: Payer: Medicare Other | Admitting: Family

## 2020-10-19 ENCOUNTER — Telehealth: Payer: Self-pay | Admitting: Orthopaedic Surgery

## 2020-10-19 DIAGNOSIS — R278 Other lack of coordination: Secondary | ICD-10-CM | POA: Diagnosis not present

## 2020-10-19 DIAGNOSIS — M810 Age-related osteoporosis without current pathological fracture: Secondary | ICD-10-CM | POA: Diagnosis not present

## 2020-10-19 DIAGNOSIS — Z9181 History of falling: Secondary | ICD-10-CM | POA: Diagnosis not present

## 2020-10-19 DIAGNOSIS — M6389 Disorders of muscle in diseases classified elsewhere, multiple sites: Secondary | ICD-10-CM | POA: Diagnosis not present

## 2020-10-19 DIAGNOSIS — M48061 Spinal stenosis, lumbar region without neurogenic claudication: Secondary | ICD-10-CM | POA: Diagnosis not present

## 2020-10-19 DIAGNOSIS — R2689 Other abnormalities of gait and mobility: Secondary | ICD-10-CM | POA: Diagnosis not present

## 2020-10-19 NOTE — Telephone Encounter (Signed)
Called pt back and informed her that she is on a cancel list.

## 2020-10-19 NOTE — Telephone Encounter (Signed)
Pts son Linda [name] called stating his mom can't wait until 11/12/20 to get the epidural with Dr.Newton; he states the pain medicine she's taking orally helps but hurts her stomach. He would really like to get this appt moved up and a CB with an update   [phone]

## 2020-10-19 NOTE — Telephone Encounter (Signed)
Please see message below on Dr. Erlinda Hong pt. Can you please call and advise if you are able to move appt up for pt?

## 2020-10-22 DIAGNOSIS — M810 Age-related osteoporosis without current pathological fracture: Secondary | ICD-10-CM | POA: Diagnosis not present

## 2020-10-22 DIAGNOSIS — Z9181 History of falling: Secondary | ICD-10-CM | POA: Diagnosis not present

## 2020-10-22 DIAGNOSIS — R2689 Other abnormalities of gait and mobility: Secondary | ICD-10-CM | POA: Diagnosis not present

## 2020-10-22 DIAGNOSIS — R278 Other lack of coordination: Secondary | ICD-10-CM | POA: Diagnosis not present

## 2020-10-22 DIAGNOSIS — M48061 Spinal stenosis, lumbar region without neurogenic claudication: Secondary | ICD-10-CM | POA: Diagnosis not present

## 2020-10-22 DIAGNOSIS — M6389 Disorders of muscle in diseases classified elsewhere, multiple sites: Secondary | ICD-10-CM | POA: Diagnosis not present

## 2020-10-23 ENCOUNTER — Telehealth: Payer: Self-pay

## 2020-10-23 DIAGNOSIS — Z9181 History of falling: Secondary | ICD-10-CM | POA: Diagnosis not present

## 2020-10-23 DIAGNOSIS — R278 Other lack of coordination: Secondary | ICD-10-CM | POA: Diagnosis not present

## 2020-10-23 DIAGNOSIS — R2689 Other abnormalities of gait and mobility: Secondary | ICD-10-CM | POA: Diagnosis not present

## 2020-10-23 DIAGNOSIS — M6389 Disorders of muscle in diseases classified elsewhere, multiple sites: Secondary | ICD-10-CM | POA: Diagnosis not present

## 2020-10-23 DIAGNOSIS — M48061 Spinal stenosis, lumbar region without neurogenic claudication: Secondary | ICD-10-CM | POA: Diagnosis not present

## 2020-10-23 DIAGNOSIS — M810 Age-related osteoporosis without current pathological fracture: Secondary | ICD-10-CM | POA: Diagnosis not present

## 2020-10-23 NOTE — Telephone Encounter (Signed)
Yes, she can certainly receive her covid booster as planned.

## 2020-10-23 NOTE — Telephone Encounter (Signed)
Spoke with patient and she verbalized understanding of Dr.Reed's response

## 2020-10-23 NOTE — Telephone Encounter (Signed)
Incoming call received from patient who is currently in rehab ar [facility]. Patient states that she will receive the moderna booster in health care at [facility] potentially on Thursday and questions if Dr.Reed thinks this is ok with all her current diagnosis and medications.  Please advise

## 2020-10-24 DIAGNOSIS — M6389 Disorders of muscle in diseases classified elsewhere, multiple sites: Secondary | ICD-10-CM | POA: Diagnosis not present

## 2020-10-24 DIAGNOSIS — R2689 Other abnormalities of gait and mobility: Secondary | ICD-10-CM | POA: Diagnosis not present

## 2020-10-24 DIAGNOSIS — M48061 Spinal stenosis, lumbar region without neurogenic claudication: Secondary | ICD-10-CM | POA: Diagnosis not present

## 2020-10-24 DIAGNOSIS — M810 Age-related osteoporosis without current pathological fracture: Secondary | ICD-10-CM | POA: Diagnosis not present

## 2020-10-24 DIAGNOSIS — R278 Other lack of coordination: Secondary | ICD-10-CM | POA: Diagnosis not present

## 2020-10-24 DIAGNOSIS — Z9181 History of falling: Secondary | ICD-10-CM | POA: Diagnosis not present

## 2020-10-25 ENCOUNTER — Encounter: Payer: Medicare Other | Admitting: Nurse Practitioner

## 2020-10-25 DIAGNOSIS — M6389 Disorders of muscle in diseases classified elsewhere, multiple sites: Secondary | ICD-10-CM | POA: Diagnosis not present

## 2020-10-25 DIAGNOSIS — Z9181 History of falling: Secondary | ICD-10-CM | POA: Diagnosis not present

## 2020-10-25 DIAGNOSIS — M810 Age-related osteoporosis without current pathological fracture: Secondary | ICD-10-CM | POA: Diagnosis not present

## 2020-10-25 DIAGNOSIS — M48061 Spinal stenosis, lumbar region without neurogenic claudication: Secondary | ICD-10-CM | POA: Diagnosis not present

## 2020-10-25 DIAGNOSIS — R2689 Other abnormalities of gait and mobility: Secondary | ICD-10-CM | POA: Diagnosis not present

## 2020-10-25 DIAGNOSIS — R278 Other lack of coordination: Secondary | ICD-10-CM | POA: Diagnosis not present

## 2020-10-26 ENCOUNTER — Telehealth: Payer: Self-pay | Admitting: Physical Medicine and Rehabilitation

## 2020-10-26 DIAGNOSIS — M810 Age-related osteoporosis without current pathological fracture: Secondary | ICD-10-CM | POA: Diagnosis not present

## 2020-10-26 DIAGNOSIS — M48061 Spinal stenosis, lumbar region without neurogenic claudication: Secondary | ICD-10-CM | POA: Diagnosis not present

## 2020-10-26 DIAGNOSIS — M6389 Disorders of muscle in diseases classified elsewhere, multiple sites: Secondary | ICD-10-CM | POA: Diagnosis not present

## 2020-10-26 DIAGNOSIS — R2689 Other abnormalities of gait and mobility: Secondary | ICD-10-CM | POA: Diagnosis not present

## 2020-10-26 DIAGNOSIS — Z9181 History of falling: Secondary | ICD-10-CM | POA: Diagnosis not present

## 2020-10-26 DIAGNOSIS — R278 Other lack of coordination: Secondary | ICD-10-CM | POA: Diagnosis not present

## 2020-10-26 NOTE — Telephone Encounter (Signed)
Worked in for 11/15.

## 2020-10-26 NOTE — Telephone Encounter (Signed)
Pts son Hassell Done called stating his mom is in a critical condition, she has high pain levels and can't walk. I did let him know that she is on a cancellation list but he would like to see if there's anyway possible to squeeze her in sooner.  [phone]

## 2020-10-29 ENCOUNTER — Ambulatory Visit (INDEPENDENT_AMBULATORY_CARE_PROVIDER_SITE_OTHER): Payer: Medicare Other | Admitting: Physical Medicine and Rehabilitation

## 2020-10-29 ENCOUNTER — Other Ambulatory Visit: Payer: Self-pay

## 2020-10-29 ENCOUNTER — Encounter: Payer: Self-pay | Admitting: Physical Medicine and Rehabilitation

## 2020-10-29 ENCOUNTER — Other Ambulatory Visit: Payer: Self-pay | Admitting: Adult Health

## 2020-10-29 ENCOUNTER — Ambulatory Visit: Payer: Self-pay

## 2020-10-29 VITALS — BP 103/63 | HR 78

## 2020-10-29 DIAGNOSIS — Z9181 History of falling: Secondary | ICD-10-CM | POA: Diagnosis not present

## 2020-10-29 DIAGNOSIS — R2689 Other abnormalities of gait and mobility: Secondary | ICD-10-CM | POA: Diagnosis not present

## 2020-10-29 DIAGNOSIS — M6389 Disorders of muscle in diseases classified elsewhere, multiple sites: Secondary | ICD-10-CM | POA: Diagnosis not present

## 2020-10-29 DIAGNOSIS — M5416 Radiculopathy, lumbar region: Secondary | ICD-10-CM

## 2020-10-29 DIAGNOSIS — M48061 Spinal stenosis, lumbar region without neurogenic claudication: Secondary | ICD-10-CM | POA: Diagnosis not present

## 2020-10-29 DIAGNOSIS — M810 Age-related osteoporosis without current pathological fracture: Secondary | ICD-10-CM | POA: Diagnosis not present

## 2020-10-29 DIAGNOSIS — R278 Other lack of coordination: Secondary | ICD-10-CM | POA: Diagnosis not present

## 2020-10-29 MED ORDER — METHYLPREDNISOLONE ACETATE 80 MG/ML IJ SUSP
80.0000 mg | Freq: Once | INTRAMUSCULAR | Status: AC
  Administered 2020-10-29: 80 mg

## 2020-10-29 MED ORDER — TRAMADOL HCL 50 MG PO TABS
25.0000 mg | ORAL_TABLET | Freq: Four times a day (QID) | ORAL | 1 refills | Status: DC | PRN
Start: 2020-10-29 — End: 2020-11-19

## 2020-10-29 NOTE — Progress Notes (Signed)
Pt state lower back pain. Pt state when she has to sit straight up in a chair makes her pain worse. Pt state that she fell 09/2020. Pt state she taking pain meds to help ease the pain. Pt has hx of inj on 9/15 Pt state that the inj worked until she fell in 09/2020.  Numeric Pain Rating Scale and Functional Assessment Average Pain 8   In the last MONTH (on 0-10 scale) has pain interfered with the following?  1. General activity like being  able to carry out your everyday physical activities such as walking, climbing stairs, carrying groceries, or moving a chair?  Rating(8)   +Driver, -BT, -Dye Allergies.

## 2020-10-29 NOTE — Procedures (Signed)
Lumbosacral Transforaminal Epidural Steroid Injection - Sub-Pedicular Approach with Fluoroscopic Guidance  Patient: Linda [name] [id number]      Date of Birth: 1932/09/09 MRN: [id number] PCP: Gayland Curry, DO      Visit Date: 10/29/2020   Universal Protocol:    Date/Time: 10/29/2020  Consent Given By: the patient  Position: PRONE  Additional Comments: Vital signs were monitored before and after the procedure. Patient was prepped and draped in the usual sterile fashion. The correct patient, procedure, and site was verified.   Injection Procedure Details:   Procedure diagnoses:  1. Lumbar radiculopathy      Meds Administered:  Meds ordered this encounter  Medications  . methylPREDNISolone acetate (DEPO-MEDROL) injection 80 mg    Laterality: Bilateral  Location/Site:  L4-L5  Needle:5.0 in., 22 ga.  Short bevel or Quincke spinal needle  Needle Placement: Transforaminal  Findings:    -Comments: Excellent flow of contrast along the nerve, nerve root and into the epidural space.  Procedure Details: After squaring off the end-plates to get a true AP view, the C-arm was positioned so that an oblique view of the foramen as noted above was visualized. The target area is just inferior to the "nose of the scotty dog" or sub pedicular. The soft tissues overlying this structure were infiltrated with 2-3 ml. of 1% Lidocaine without Epinephrine.  The spinal needle was inserted toward the target using a "trajectory" view along the fluoroscope beam.  Under AP and lateral visualization, the needle was advanced so it did not puncture dura and was located close the 6 O'Clock position of the pedical in AP tracterory. Biplanar projections were used to confirm position. Aspiration was confirmed to be negative for CSF and/or blood. A 1-2 ml. volume of Isovue-250 was injected and flow of contrast was noted at each level. Radiographs were obtained for documentation purposes.   After attaining the  desired flow of contrast documented above, a 0.5 to 1.0 ml test dose of 0.25% Marcaine was injected into each respective transforaminal space.  The patient was observed for 90 seconds post injection.  After no sensory deficits were reported, and normal lower extremity motor function was noted,   the above injectate was administered so that equal amounts of the injectate were placed at each foramen (level) into the transforaminal epidural space.   Additional Comments:  The patient tolerated the procedure well Dressing: 2 x 2 sterile gauze and Band-Aid    Post-procedure details: Patient was observed during the procedure. Post-procedure instructions were reviewed.  Patient left the clinic in stable condition.

## 2020-10-30 DIAGNOSIS — R2689 Other abnormalities of gait and mobility: Secondary | ICD-10-CM | POA: Diagnosis not present

## 2020-10-30 DIAGNOSIS — M48061 Spinal stenosis, lumbar region without neurogenic claudication: Secondary | ICD-10-CM | POA: Diagnosis not present

## 2020-10-30 DIAGNOSIS — M810 Age-related osteoporosis without current pathological fracture: Secondary | ICD-10-CM | POA: Diagnosis not present

## 2020-10-30 DIAGNOSIS — R278 Other lack of coordination: Secondary | ICD-10-CM | POA: Diagnosis not present

## 2020-10-30 DIAGNOSIS — M6389 Disorders of muscle in diseases classified elsewhere, multiple sites: Secondary | ICD-10-CM | POA: Diagnosis not present

## 2020-10-30 DIAGNOSIS — Z9181 History of falling: Secondary | ICD-10-CM | POA: Diagnosis not present

## 2020-10-30 NOTE — Progress Notes (Signed)
Linda [name] [id number] - 86 y.o. female MRN [id number]  Date of birth: 06/24/1932  Office Visit Note: Visit Date: 10/29/2020 PCP: Gayland Curry, DO Referred by: Gayland Curry, DO  Subjective: Chief Complaint  Patient presents with  . Lower Back - Pain   HPI: Linda [name] is a 86 y.o. female who comes in today at the request of Dr. Eduard Roux for planned Bilateral L4-L5 Lumbar epidural steroid injection with fluoroscopic guidance.  The patient has failed conservative care including home exercise, medications, time and activity modification.  This injection will be diagnostic and hopefully therapeutic.  Please see requesting physician notes for further details and justification.  She had a prior L4 transforaminal injection with really good relief of symptoms up until a fall in October.  She is followed by Dr. Rodell Perna in the office but did come in to see Dr. Eduard Roux after the fall.  After his evaluation he felt like repeat injection may give her some relief.  Her pain is quite severe today and she is having trouble even sitting up in a chair.  Pain is across the lower back into both hips.  No groin pain.  No focal weakness just a lot of pain not responsive to medication.  I will complete bilateral L4 transforaminal injection.  Depending on relief would suggest follow-up with Dr. Lorin Mercy or potential for repeat lumbar spine or pelvic MRI with possibility of sacral insufficiency fracture if nothing identified more severe on the lumbar MRI.  She does have a history of osteoporosis.  X-rays taken by Dr. Erlinda Hong did not show any sign of vertebral body compression.  ROS Otherwise per HPI.  Assessment & Plan: Visit Diagnoses:  1. Lumbar radiculopathy     Plan: No additional findings.   Meds & Orders:  Meds ordered this encounter  Medications  . methylPREDNISolone acetate (DEPO-MEDROL) injection 80 mg    Orders Placed This Encounter  Procedures  . XR C-ARM NO REPORT  . Epidural Steroid  injection    Follow-up: Return for visit to requesting physician as needed.   Procedures: No procedures performed  Lumbosacral Transforaminal Epidural Steroid Injection - Sub-Pedicular Approach with Fluoroscopic Guidance  Patient: Linda [name] JJOACZYSA      Date of Birth: 06/21/1932 MRN: [id number] PCP: Gayland Curry, DO      Visit Date: 10/29/2020   Universal Protocol:    Date/Time: 10/29/2020  Consent Given By: the patient  Position: PRONE  Additional Comments: Vital signs were monitored before and after the procedure. Patient was prepped and draped in the usual sterile fashion. The correct patient, procedure, and site was verified.   Injection Procedure Details:   Procedure diagnoses:  1. Lumbar radiculopathy      Meds Administered:  Meds ordered this encounter  Medications  . methylPREDNISolone acetate (DEPO-MEDROL) injection 80 mg    Laterality: Bilateral  Location/Site:  L4-L5  Needle:5.0 in., 22 ga.  Short bevel or Quincke spinal needle  Needle Placement: Transforaminal  Findings:    -Comments: Excellent flow of contrast along the nerve, nerve root and into the epidural space.  Procedure Details: After squaring off the end-plates to get a true AP view, the C-arm was positioned so that an oblique view of the foramen as noted above was visualized. The target area is just inferior to the "nose of the scotty dog" or sub pedicular. The soft tissues overlying this structure were infiltrated with 2-3 ml. of 1% Lidocaine without Epinephrine.  The spinal needle  was inserted toward the target using a "trajectory" view along the fluoroscope beam.  Under AP and lateral visualization, the needle was advanced so it did not puncture dura and was located close the 6 O'Clock position of the pedical in AP tracterory. Biplanar projections were used to confirm position. Aspiration was confirmed to be negative for CSF and/or blood. A 1-2 ml. volume of Isovue-250 was injected and  flow of contrast was noted at each level. Radiographs were obtained for documentation purposes.   After attaining the desired flow of contrast documented above, a 0.5 to 1.0 ml test dose of 0.25% Marcaine was injected into each respective transforaminal space.  The patient was observed for 90 seconds post injection.  After no sensory deficits were reported, and normal lower extremity motor function was noted,   the above injectate was administered so that equal amounts of the injectate were placed at each foramen (level) into the transforaminal epidural space.   Additional Comments:  The patient tolerated the procedure well Dressing: 2 x 2 sterile gauze and Band-Aid    Post-procedure details: Patient was observed during the procedure. Post-procedure instructions were reviewed.  Patient left the clinic in stable condition.      Clinical History: MRI LUMBAR SPINE WITHOUT CONTRAST  TECHNIQUE: Multiplanar, multisequence MR imaging of the lumbar spine was performed. No intravenous contrast was administered.  COMPARISON:  Radiography 09/14/2018  FINDINGS: Segmentation:  5 lumbar type vertebral bodies  Alignment: Mild dextrocurvature. Grade 1 anterolisthesis at L4-5 and slight retrolisthesis at L5-S1  Vertebrae:  No fracture, evidence of discitis, or bone lesion.  Conus medullaris and cauda equina: Conus extends to the L1 level. Conus and cauda equina appear normal.  Paraspinal and other soft tissues: Negative  Disc levels:  T12- L1: Shallow protrusion on sagittal imaging.  No impingement  L1-L2: Disc narrowing and bulging.  Negative facets.  No impingement  L2-L3: Advanced disc narrowing with mild bulging and endplate ridging. Mild posterior element hypertrophy. No impingement  L3-L4: Disc narrowing and bulging. Degenerative facet spurring. Patent spinal canal. There is moderate right foraminal narrowing. Patent left foramen.  L4-L5: Advanced facet  arthropathy with spurring and anterolisthesis. Mild to moderate spinal stenosis. Bilateral subarticular recess effacement but the descending L5 nerve roots are medial and non-compressed. Noncompressive bilateral foraminal narrowing  L5-S1:Disc narrowing and ridging with bulge. Mild facet spurring. Moderate to advanced right foraminal stenosis. Noncompressive left foraminal narrowing.  IMPRESSION: 1. Diffuse disc and facet degeneration with L4-5 anterolisthesis. 2. L4-5 mild to moderate spinal stenosis. 3. Diffuse foraminal narrowing that is moderate to advanced on the right at L5-S1 and moderate on the right at L3-4.   Electronically Signed   By: Monte Fantasia M.D.   On: 10/21/2018 15:45   She reports that she has never smoked. She has never used smokeless tobacco. No results for input(s): HGBA1C, LABURIC in the last 8760 hours.  Objective:  VS:  HT:    WT:   BMI:     BP:103/63  HR:78bpm  TEMP: ( )  RESP:  Physical Exam Constitutional:      General: She is not in acute distress.    Appearance: Normal appearance. She is obese. She is not ill-appearing.  HENT:     Head: Normocephalic and atraumatic.     Right Ear: External ear normal.     Left Ear: External ear normal.  Eyes:     Extraocular Movements: Extraocular movements intact.  Cardiovascular:     Rate and Rhythm: Normal rate.  Pulses: Normal pulses.  Musculoskeletal:     Right lower leg: No edema.     Left lower leg: No edema.     Comments: Patient has good distal strength with no pain over the greater trochanters.  No clonus or focal weakness.  She has a lot of pain with any movement rotation sitting up standing.  Skin:    Findings: No erythema, lesion or rash.  Neurological:     General: No focal deficit present.     Mental Status: She is alert and oriented to person, place, and time.     Sensory: No sensory deficit.     Motor: No weakness or abnormal muscle tone.     Coordination: Coordination  normal.     Gait: Gait abnormal.  Psychiatric:        Mood and Affect: Mood normal.        Behavior: Behavior normal.     Ortho Exam  Imaging: XR C-ARM NO REPORT  Result Date: 10/29/2020 Please see Notes tab for imaging impression.   Past Medical/Family/Surgical/Social History: Medications & Allergies reviewed per EMR, new medications updated. Patient Active Problem List   Diagnosis Date Noted  . Rash 12/15/2019  . Venous insufficiency of both lower extremities 07/15/2019  . Environmental allergies 02/23/2019  . Osteoporosis with pathological fracture 02/23/2019  . Senile purpura ([place]) 02/23/2019  . History of uterine cancer 01/19/2019  . Family history of uterine cancer   . Family history of kidney cancer   . Family history of lung cancer   . Fracture of right acetabulum ([place]) 01/11/2019  . Chronic constipation with overflow incontinence 12/31/2018  . Physical deconditioning 12/31/2018  . Spinal stenosis of lumbar region without neurogenic claudication 12/03/2018  . Lumbar foraminal stenosis 12/03/2018  . 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 Seasonal allergies 10/20/2018  . Trochanteric bursitis, right hip 09/14/2018  . Chronic bilateral low back pain 09/14/2018  . Hoarseness of voice 04/08/2018  . Hypertension, essential, benign 01/16/2018  . Sensorineural hearing loss (SNHL) of both ears 01/16/2018  . Insomnia 01/16/2018  . MALT lymphoma ([place]) 01/15/2018  . 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 Osteopenia of both hips 01/15/2018  . Hyperlipidemia 01/15/2018  . DJD (degenerative joint disease) of knee 02/13/2011  . H/O difficult intubation 02/12/2011  . Dry eye syndrome 04/10/2010  . Incomplete emptying of bladder 08/31/2009  . Borderline glaucoma with ocular hypertension 09/07/2008   Past Medical History:  Diagnosis Date  . Allergy   . Anxiety   . Arthritis   . Bursitis    right leg  . Family history of kidney cancer   . Family history of  lung cancer   . Family history of uterine cancer   . GERD (gastroesophageal reflux disease)   . History of blood transfusion   . History of uterine cancer 01/19/2019  . Hyperlipidemia   . Hypertension   . Insomnia   . Non Hodgkin's lymphoma ([place]) 2017-2019   cured  . Osteopenia    MILD  . Urine retention   . Uterine cancer ([place])    at age 61   Family History  Problem Relation Age of Onset  . Uterine cancer Sister 70       uterine cancer  . COPD Maternal Grandfather   . Lung cancer Maternal Grandfather   . Heart failure Mother 84  . Pneumonia Father 86  . Dementia Father   . Uterine cancer Maternal Grandmother 62  . Other Sister  Guillain Barre syndrom  . Kidney cancer Son 26  . Dementia Paternal Grandmother   . Lung cancer Maternal Uncle   . Other Son        Familial Hypercholesterolemia  . Colon cancer Neg Hx   . Esophageal cancer Neg Hx   . Liver cancer Neg Hx   . Pancreatic cancer Neg Hx   . Rectal cancer Neg Hx   . Stomach cancer Neg Hx    Past Surgical History:  Procedure Laterality Date  . ABDOMINAL HYSTERECTOMY     AGE 32  . APPENDECTOMY     AGE 37  . BLADDER SURGERY    . Catarct Surgery    . ECTOPIC PREGNANCY SURGERY     age 87  . LAPAROSCOPIC SMALL BOWEL RESECTION    . NM PET DX LYMPHOMA  10/2018   IN REMISSION  . occular pressure    . OTHER SURGICAL HISTORY    . REPLACEMENT TOTAL KNEE BILATERAL    . TONSILLECTOMY AND ADENOIDECTOMY     age 43 or 64  . UTERINE CANCER SURGERY     age 55- located in vagina   Social History   Occupational History  . Occupation: retired  Tobacco Use  . Smoking status: Never Smoker  . Smokeless tobacco: Never Used  Vaping Use  . Vaping Use: Never used  Substance and Sexual Activity  . Alcohol use: Yes    Alcohol/week: 7.0 standard drinks    Types: 7 Glasses of wine per week    Comment: one glass of wine with dinner  . Drug use: No  . Sexual activity: Never

## 2020-10-31 DIAGNOSIS — R2689 Other abnormalities of gait and mobility: Secondary | ICD-10-CM | POA: Diagnosis not present

## 2020-10-31 DIAGNOSIS — M810 Age-related osteoporosis without current pathological fracture: Secondary | ICD-10-CM | POA: Diagnosis not present

## 2020-10-31 DIAGNOSIS — M6389 Disorders of muscle in diseases classified elsewhere, multiple sites: Secondary | ICD-10-CM | POA: Diagnosis not present

## 2020-10-31 DIAGNOSIS — M48061 Spinal stenosis, lumbar region without neurogenic claudication: Secondary | ICD-10-CM | POA: Diagnosis not present

## 2020-10-31 DIAGNOSIS — R278 Other lack of coordination: Secondary | ICD-10-CM | POA: Diagnosis not present

## 2020-10-31 DIAGNOSIS — Z9181 History of falling: Secondary | ICD-10-CM | POA: Diagnosis not present

## 2020-11-01 DIAGNOSIS — M6389 Disorders of muscle in diseases classified elsewhere, multiple sites: Secondary | ICD-10-CM | POA: Diagnosis not present

## 2020-11-01 DIAGNOSIS — M810 Age-related osteoporosis without current pathological fracture: Secondary | ICD-10-CM | POA: Diagnosis not present

## 2020-11-01 DIAGNOSIS — Z9181 History of falling: Secondary | ICD-10-CM | POA: Diagnosis not present

## 2020-11-01 DIAGNOSIS — R278 Other lack of coordination: Secondary | ICD-10-CM | POA: Diagnosis not present

## 2020-11-01 DIAGNOSIS — R2689 Other abnormalities of gait and mobility: Secondary | ICD-10-CM | POA: Diagnosis not present

## 2020-11-01 DIAGNOSIS — M48061 Spinal stenosis, lumbar region without neurogenic claudication: Secondary | ICD-10-CM | POA: Diagnosis not present

## 2020-11-02 DIAGNOSIS — R278 Other lack of coordination: Secondary | ICD-10-CM | POA: Diagnosis not present

## 2020-11-02 DIAGNOSIS — Z9181 History of falling: Secondary | ICD-10-CM | POA: Diagnosis not present

## 2020-11-02 DIAGNOSIS — M810 Age-related osteoporosis without current pathological fracture: Secondary | ICD-10-CM | POA: Diagnosis not present

## 2020-11-02 DIAGNOSIS — M48061 Spinal stenosis, lumbar region without neurogenic claudication: Secondary | ICD-10-CM | POA: Diagnosis not present

## 2020-11-02 DIAGNOSIS — R2689 Other abnormalities of gait and mobility: Secondary | ICD-10-CM | POA: Diagnosis not present

## 2020-11-02 DIAGNOSIS — M6389 Disorders of muscle in diseases classified elsewhere, multiple sites: Secondary | ICD-10-CM | POA: Diagnosis not present

## 2020-11-06 DIAGNOSIS — M810 Age-related osteoporosis without current pathological fracture: Secondary | ICD-10-CM | POA: Diagnosis not present

## 2020-11-06 DIAGNOSIS — R2689 Other abnormalities of gait and mobility: Secondary | ICD-10-CM | POA: Diagnosis not present

## 2020-11-06 DIAGNOSIS — Z9181 History of falling: Secondary | ICD-10-CM | POA: Diagnosis not present

## 2020-11-06 DIAGNOSIS — R278 Other lack of coordination: Secondary | ICD-10-CM | POA: Diagnosis not present

## 2020-11-06 DIAGNOSIS — M48061 Spinal stenosis, lumbar region without neurogenic claudication: Secondary | ICD-10-CM | POA: Diagnosis not present

## 2020-11-06 DIAGNOSIS — M6389 Disorders of muscle in diseases classified elsewhere, multiple sites: Secondary | ICD-10-CM | POA: Diagnosis not present

## 2020-11-07 DIAGNOSIS — Z9181 History of falling: Secondary | ICD-10-CM | POA: Diagnosis not present

## 2020-11-07 DIAGNOSIS — R278 Other lack of coordination: Secondary | ICD-10-CM | POA: Diagnosis not present

## 2020-11-07 DIAGNOSIS — R2689 Other abnormalities of gait and mobility: Secondary | ICD-10-CM | POA: Diagnosis not present

## 2020-11-07 DIAGNOSIS — M6389 Disorders of muscle in diseases classified elsewhere, multiple sites: Secondary | ICD-10-CM | POA: Diagnosis not present

## 2020-11-07 DIAGNOSIS — M48061 Spinal stenosis, lumbar region without neurogenic claudication: Secondary | ICD-10-CM | POA: Diagnosis not present

## 2020-11-07 DIAGNOSIS — M810 Age-related osteoporosis without current pathological fracture: Secondary | ICD-10-CM | POA: Diagnosis not present

## 2020-11-12 ENCOUNTER — Ambulatory Visit: Payer: Medicare Other | Admitting: Physical Medicine and Rehabilitation

## 2020-11-13 ENCOUNTER — Ambulatory Visit (INDEPENDENT_AMBULATORY_CARE_PROVIDER_SITE_OTHER): Payer: Medicare Other

## 2020-11-13 ENCOUNTER — Encounter: Payer: Self-pay | Admitting: Orthopaedic Surgery

## 2020-11-13 ENCOUNTER — Ambulatory Visit (INDEPENDENT_AMBULATORY_CARE_PROVIDER_SITE_OTHER): Payer: Medicare Other | Admitting: Orthopaedic Surgery

## 2020-11-13 VITALS — Ht 64.0 in | Wt 191.0 lb

## 2020-11-13 DIAGNOSIS — M545 Low back pain, unspecified: Secondary | ICD-10-CM | POA: Diagnosis not present

## 2020-11-13 DIAGNOSIS — S32010A Wedge compression fracture of first lumbar vertebra, initial encounter for closed fracture: Secondary | ICD-10-CM | POA: Diagnosis not present

## 2020-11-13 DIAGNOSIS — S32000A Wedge compression fracture of unspecified lumbar vertebra, initial encounter for closed fracture: Secondary | ICD-10-CM | POA: Insufficient documentation

## 2020-11-13 NOTE — Progress Notes (Signed)
Office Visit Note   Patient: Linda [name]           Date of Birth: 1932/05/15           MRN: [id number] Visit Date: 11/13/2020              Requested by: Gayland Curry, DO [place],  [place] [address] PCP: Gayland Curry, DO   Assessment & Plan: Visit Diagnoses:  1. Acute bilateral low back pain, unspecified whether sciatica present   2. Compression fracture of L1 vertebra, initial encounter [facility])     Plan: Patient is neurologically intact.  Will obtain an MRI scan for evaluation of L1 compression make sure she does not have associated disc herniation.  Follow-up after MRI.  Currently patient has no findings consistent with cauda equina syndrome.  We will make a routine follow-up in 4 weeks and I will call her with the results of MRI scan once it is available.  Follow-Up Instructions: No follow-ups on file.   Orders:  Orders Placed This Encounter  Procedures  . XR Lumbar Spine 2-3 Views   No orders of the defined types were placed in this encounter.     Procedures: No procedures performed   Clinical Data: No additional findings.   Subjective: Chief Complaint  Patient presents with  . Lower Back - Pain    HPI 86 year old female returns post fall mid-October with onset of severe back pain.  Patient states her pain is 10 out of 10 trying to sit.  She is much more comfortable if she is either lying flat or she standing upright.  She is doing some walking and she is here with her son.  She had an epidural with Dr. Ernestina Patches and she thinks maybe she was better for a few hours but it really did not help.  I had seen her back in August.  X-rays at that time showed some listhesis L4-5 L5-S1 but no fracture.  I reviewed x-rays from 10/16/2020 and it does appear that L1 has a compression fracture which is new from the 08/14/2020 images.  Patient had had some stronger pain medication but had significant problems with constipation.  She states she is already on a couple  medications for bowel regimen to keep her bowels regular.  Review of Systems patient has had previous acetabular fracture nondisplaced that healed.  Concern for scar tissue at the area of healing led to PET scan abdominal CT scan etc. all negative for malignancy.  Past history of non-Hodgkin's lymphoma.   Objective: Vital Signs: Ht 5\' 4"  (1.626 m)   Wt 191 lb (86.6 kg)   BMI 32.79 kg/m   Physical Exam Constitutional:      Appearance: She is well-developed.  HENT:     Head: Normocephalic.     Right Ear: External ear normal.     Left Ear: External ear normal.  Eyes:     Pupils: Pupils are equal, round, and reactive to light.  Neck:     Thyroid: No thyromegaly.     Trachea: No tracheal deviation.  Cardiovascular:     Rate and Rhythm: Normal rate.  Pulmonary:     Effort: Pulmonary effort is normal.  Abdominal:     Palpations: Abdomen is soft.  Skin:    General: Skin is warm and dry.  Neurological:     Mental Status: She is alert and oriented to person, place, and time.  Psychiatric:  Behavior: Behavior normal.     Ortho Exam increased pain getting from a sitting position to standing.  Pain and tenderness in foot the lumbar spine.  He and ankle jerk are intact.  Foot dorsiflexion plantarflexion is intact.  Specialty Comments:  No specialty comments available.  Imaging: No results found.   PMFS History: Patient Active Problem List   Diagnosis Date Noted  . Lumbar compression fracture ([place]) 11/13/2020  . Rash 12/15/2019  . Venous insufficiency of both lower extremities 07/15/2019  . Environmental allergies 02/23/2019  . Osteoporosis with pathological fracture 02/23/2019  . Senile purpura ([place]) 02/23/2019  . History of uterine cancer 01/19/2019  . Family history of uterine cancer   . Family history of kidney cancer   . Family history of lung cancer   . Fracture of right acetabulum ([place]) 01/11/2019  . Chronic constipation with overflow incontinence 12/31/2018   . Physical deconditioning 12/31/2018  . Spinal stenosis of lumbar region without neurogenic claudication 12/03/2018  . Lumbar foraminal stenosis 12/03/2018  . 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 Seasonal allergies 10/20/2018  . Trochanteric bursitis, right hip 09/14/2018  . Chronic bilateral low back pain 09/14/2018  . Hoarseness of voice 04/08/2018  . Hypertension, essential, benign 01/16/2018  . Sensorineural hearing loss (SNHL) of both ears 01/16/2018  . Insomnia 01/16/2018  . MALT lymphoma ([place]) 01/15/2018  . H/O Cancer of vaginal vault ([employer]) 01/15/2018  . Osteopenia of both hips 01/15/2018  . Hyperlipidemia 01/15/2018  . DJD (degenerative joint disease) of knee 02/13/2011  . H/O difficult intubation 02/12/2011  . Dry eye syndrome 04/10/2010  . Incomplete emptying of bladder 08/31/2009  . Borderline glaucoma with ocular hypertension 09/07/2008   Past Medical History:  Diagnosis Date  . Allergy   . Anxiety   . Arthritis   . Bursitis    right leg  . Family history of kidney cancer   . Family history of lung cancer   . Family history of uterine cancer   . GERD (gastroesophageal reflux disease)   . History of blood transfusion   . History of uterine cancer 01/19/2019  . Hyperlipidemia   . Hypertension   . Insomnia   . Non Hodgkin's lymphoma ([place]) 2017-2019   cured  . Osteopenia    MILD  . Urine retention   . Uterine cancer ([place])    at age 68    Family History  Problem Relation Age of Onset  . Uterine cancer Sister 56       uterine cancer  . COPD Maternal Grandfather   . Lung cancer Maternal Grandfather   . Heart failure Mother 62  . Pneumonia Father 62  . Dementia Father   . Uterine cancer Maternal Grandmother 62  . Other Sister        Guillain Barre syndrom  . Kidney cancer Son 60  . Dementia Paternal Grandmother   . Lung cancer Maternal Uncle   . Other Son        Familial Hypercholesterolemia  . Colon cancer Neg Hx   . Esophageal cancer Neg Hx   .  Liver cancer Neg Hx   . Pancreatic cancer Neg Hx   . Rectal cancer Neg Hx   . Stomach cancer Neg Hx     Past Surgical History:  Procedure Laterality Date  . ABDOMINAL HYSTERECTOMY     AGE 78  . APPENDECTOMY     AGE 32  . BLADDER SURGERY    . Catarct Surgery    .  ECTOPIC PREGNANCY SURGERY     age 30  . LAPAROSCOPIC SMALL BOWEL RESECTION    . NM PET DX LYMPHOMA  10/2018   IN REMISSION  . occular pressure    . OTHER SURGICAL HISTORY    . REPLACEMENT TOTAL KNEE BILATERAL    . TONSILLECTOMY AND ADENOIDECTOMY     age 86 or 89  . UTERINE CANCER SURGERY     age 55- located in vagina   Social History   Occupational History  . Occupation: retired  Tobacco Use  . Smoking status: Never Smoker  . Smokeless tobacco: Never Used  Vaping Use  . Vaping Use: Never used  Substance and Sexual Activity  . Alcohol use: Yes    Alcohol/week: 7.0 standard drinks    Types: 7 Glasses of wine per week    Comment: one glass of wine with dinner  . Drug use: No  . Sexual activity: Never

## 2020-11-14 DIAGNOSIS — R278 Other lack of coordination: Secondary | ICD-10-CM | POA: Diagnosis not present

## 2020-11-14 DIAGNOSIS — M48061 Spinal stenosis, lumbar region without neurogenic claudication: Secondary | ICD-10-CM | POA: Diagnosis not present

## 2020-11-14 DIAGNOSIS — M6389 Disorders of muscle in diseases classified elsewhere, multiple sites: Secondary | ICD-10-CM | POA: Diagnosis not present

## 2020-11-14 DIAGNOSIS — M810 Age-related osteoporosis without current pathological fracture: Secondary | ICD-10-CM | POA: Diagnosis not present

## 2020-11-14 DIAGNOSIS — Z9181 History of falling: Secondary | ICD-10-CM | POA: Diagnosis not present

## 2020-11-14 DIAGNOSIS — R2689 Other abnormalities of gait and mobility: Secondary | ICD-10-CM | POA: Diagnosis not present

## 2020-11-15 ENCOUNTER — Other Ambulatory Visit: Payer: Self-pay

## 2020-11-15 ENCOUNTER — Ambulatory Visit
Admission: RE | Admit: 2020-11-15 | Discharge: 2020-11-15 | Disposition: A | Discharge status: Home / Self Care | Payer: Medicare Other | Source: Ambulatory Visit | Attending: Orthopaedic Surgery | Admitting: Orthopaedic Surgery

## 2020-11-15 DIAGNOSIS — S32010A Wedge compression fracture of first lumbar vertebra, initial encounter for closed fracture: Secondary | ICD-10-CM

## 2020-11-15 DIAGNOSIS — M545 Low back pain, unspecified: Secondary | ICD-10-CM | POA: Diagnosis not present

## 2020-11-15 DIAGNOSIS — M48061 Spinal stenosis, lumbar region without neurogenic claudication: Secondary | ICD-10-CM | POA: Diagnosis not present

## 2020-11-17 DIAGNOSIS — R2689 Other abnormalities of gait and mobility: Secondary | ICD-10-CM | POA: Diagnosis not present

## 2020-11-17 DIAGNOSIS — M6389 Disorders of muscle in diseases classified elsewhere, multiple sites: Secondary | ICD-10-CM | POA: Diagnosis not present

## 2020-11-17 DIAGNOSIS — M810 Age-related osteoporosis without current pathological fracture: Secondary | ICD-10-CM | POA: Diagnosis not present

## 2020-11-17 DIAGNOSIS — Z9181 History of falling: Secondary | ICD-10-CM | POA: Diagnosis not present

## 2020-11-17 DIAGNOSIS — R278 Other lack of coordination: Secondary | ICD-10-CM | POA: Diagnosis not present

## 2020-11-17 DIAGNOSIS — M48061 Spinal stenosis, lumbar region without neurogenic claudication: Secondary | ICD-10-CM | POA: Diagnosis not present

## 2020-11-19 ENCOUNTER — Telehealth: Payer: Self-pay

## 2020-11-19 ENCOUNTER — Other Ambulatory Visit: Payer: Self-pay | Admitting: *Deleted

## 2020-11-19 MED ORDER — TRAMADOL HCL 50 MG PO TABS: 25.0000 mg | ORAL_TABLET | Freq: Four times a day (QID) | ORAL | 0 refills | Status: DC | PRN

## 2020-11-19 MED ORDER — METHOCARBAMOL 500 MG PO TABS
250.0000 mg | ORAL_TABLET | Freq: Four times a day (QID) | ORAL | 0 refills | Status: DC | PRN
Start: 2020-11-19 — End: 2020-11-26

## 2020-11-19 NOTE — Telephone Encounter (Signed)
Linda [name] called to check the status of her request.  Linda [name] aware message was addressed at 1:19 pm by Dr.Reed and placed in the to be fax bin for the administrative staff. At the request of Linda [name] I placed her on a brief hold and faxed orders to both [facility] numbers, which are programed in our fax machine and to the number she provided.

## 2020-11-19 NOTE — Telephone Encounter (Signed)
Patient requested refills for Tramadol and Robaxin. Stated that she takes both 1/2 tablet every 6 hours.  Stated that she has an appointment with Dr. Mariea Clonts on Wednesday but she will run out of medications before then.   Pended Rx's and sent to Dr. Mariea Clonts for approval.

## 2020-11-19 NOTE — Telephone Encounter (Signed)
Manuela Schwartz from [facility] called regarding patient. She stated patient had requested a 2-4 week transfer [place] due to an L1 compression fracture and not being able to care for herself as usual at home.   Verbal order was given, but she has requested any orders for care to be faxed to [phone].

## 2020-11-19 NOTE — Telephone Encounter (Signed)
Orders written and placed to be faxed back to [facility] attn: Manuela Schwartz.

## 2020-11-20 ENCOUNTER — Non-Acute Institutional Stay: Payer: Medicare Other | Admitting: Internal Medicine

## 2020-11-20 ENCOUNTER — Encounter: Payer: Self-pay | Admitting: Internal Medicine

## 2020-11-20 DIAGNOSIS — R339 Retention of urine, unspecified: Secondary | ICD-10-CM

## 2020-11-20 DIAGNOSIS — K5909 Other constipation: Secondary | ICD-10-CM | POA: Diagnosis not present

## 2020-11-20 DIAGNOSIS — S32010A Wedge compression fracture of first lumbar vertebra, initial encounter for closed fracture: Secondary | ICD-10-CM

## 2020-11-20 DIAGNOSIS — M791 Myalgia, unspecified site: Secondary | ICD-10-CM

## 2020-11-20 DIAGNOSIS — M48061 Spinal stenosis, lumbar region without neurogenic claudication: Secondary | ICD-10-CM | POA: Diagnosis not present

## 2020-11-20 DIAGNOSIS — R110 Nausea: Secondary | ICD-10-CM

## 2020-11-20 DIAGNOSIS — M6389 Disorders of muscle in diseases classified elsewhere, multiple sites: Secondary | ICD-10-CM | POA: Diagnosis not present

## 2020-11-20 DIAGNOSIS — Z9181 History of falling: Secondary | ICD-10-CM

## 2020-11-20 DIAGNOSIS — I872 Venous insufficiency (chronic) (peripheral): Secondary | ICD-10-CM | POA: Diagnosis not present

## 2020-11-20 DIAGNOSIS — M810 Age-related osteoporosis without current pathological fracture: Secondary | ICD-10-CM | POA: Diagnosis not present

## 2020-11-20 DIAGNOSIS — R2689 Other abnormalities of gait and mobility: Secondary | ICD-10-CM | POA: Diagnosis not present

## 2020-11-20 DIAGNOSIS — D692 Other nonthrombocytopenic purpura: Secondary | ICD-10-CM | POA: Diagnosis not present

## 2020-11-20 DIAGNOSIS — R278 Other lack of coordination: Secondary | ICD-10-CM | POA: Diagnosis not present

## 2020-11-20 NOTE — Progress Notes (Signed)
Patient ID: Linda [name] [id number], female   DOB: August 17, 1932, 86 y.o.   MRN: [id number]  Provider:  Rexene Edison. Mariea Clonts, D.O., C.M.D. Location:  [facility] Room Number: 163 Place of Service:  ALF (13)  PCP: Gayland Curry, DO Patient Care Team: Gayland Curry, DO as PCP - General (Geriatric Medicine) Rolm Bookbinder, MD as Consulting Physician (Dermatology) Marybelle Killings, MD as Consulting Physician (Orthopedic Surgery) Jola Schmidt, MD as Consulting Physician (Ophthalmology) Milus Banister, MD as Attending Physician (Gastroenterology) Regal, Tamala Fothergill, DPM as Consulting Physician (Podiatry) Lucas Mallow, MD as Consulting Physician (Urology) Jodi Marble, MD as Consulting Physician (Otolaryngology)  Extended Emergency Contact Information Primary Emergency Contact: [name] Address: [address]          [place], [place] [address] [name] of [place] Phone: [phone] Relation: Son Secondary Emergency Contact: [name] Address: [address] [place] [address] [name] of [place] Mobile Phone: [phone] Relation: Son  Code Status: FULL CODE Goals of Care: Advanced Directive information Advanced Directives 11/20/2020  Does Patient Have a Medical Advance Directive? Yes  Type of Advance Directive Living will;Healthcare Power of Attorney  Does patient want to make changes to medical advance directive? No - Patient declined  Copy of [place] in Chart? Yes - validated most recent copy scanned in chart (See row information)   Chief Complaint  Patient presents with  . New Admit To SNF    New admission to [facility] SNF for respite due to L1 compression fx and increased need for assistance during recovery     HPI: Patient is a 86 y.o. female seen today for admission to [place] respite to assist with her care as she recovers from her L1 compression fx.  She has a past medical history  significant for venous insufficiency, right hip fx, lumbar spinal stenosis, osteoporosis, hearing loss, urinary retention, MALT lymphoma, chronic constipation with overflow incontinence and prior obstruction among others.    She notes ongoing pain across her lower back/hips, especially on the left lower flank area.  She still has difficulty getting up and down and is very uncomfortable sitting straight up like in the dining chair.  She is requesting a raised toilet seat due to doing her I/O caths and being unable to do it at this height on the regular commode.  She would like something else to help her pain also. When she does not take the pain meds regularly (she has chosen still to manage her meds though that's one of the privileges of being in [place] respite) so if she misses it, then she ends of in severe pain and becomes nauseous.  She reports requesting zofran but being told it could not be given to her (standing order).    Past Medical History:  Diagnosis Date  . Allergy   . Anxiety   . Arthritis   . Bursitis    right leg  . Family history of kidney cancer   . Family history of lung cancer   . Family history of uterine cancer   . GERD (gastroesophageal reflux disease)   . History of blood transfusion   . History of uterine cancer 01/19/2019  . Hyperlipidemia   . Hypertension   . Insomnia   . Non Hodgkin's lymphoma ([place]) 2017-2019   cured  . Osteopenia    MILD  . Urine retention   . Uterine cancer (  [place])    at age 44   Past Surgical History:  Procedure Laterality Date  . ABDOMINAL HYSTERECTOMY     AGE 51  . APPENDECTOMY     AGE 30  . BLADDER SURGERY    . Catarct Surgery    . ECTOPIC PREGNANCY SURGERY     age 11  . LAPAROSCOPIC SMALL BOWEL RESECTION    . NM PET DX LYMPHOMA  10/2018   IN REMISSION  . occular pressure    . OTHER SURGICAL HISTORY    . REPLACEMENT TOTAL KNEE BILATERAL    . TONSILLECTOMY AND ADENOIDECTOMY     age 61 or 28  . UTERINE CANCER SURGERY     age 53-  located in vagina    Social History   Socioeconomic History  . Marital status: Single    Spouse name: Not on file  . Number of children: Not on file  . Years of education: Not on file  . Highest education level: Not on file  Occupational History  . Occupation: retired  Tobacco Use  . Smoking status: Never Smoker  . Smokeless tobacco: Never Used  Vaping Use  . Vaping Use: Never used  Substance and Sexual Activity  . Alcohol use: Yes    Alcohol/week: 7.0 standard drinks    Types: 7 Glasses of wine per week    Comment: one glass of wine with dinner  . Drug use: No  . Sexual activity: Never  Other Topics Concern  . Not on file  Social History Narrative   Social History      Diet? Low to no fiber diet; lactose (no tolerance), low or no soy      Do you drink/eat things with caffeine? No liquid caffeine, occasional chocolate      Marital status?                divorced                    What year were you married? n/a      Do you live in a house, apartment, assisted living, condo, trailer, etc.? Apartment/ retirement      Is it one or more stories? one      How many persons live in your home? one      Do you have any pets in your home? (please list) no      Highest level of education completed? Master degree      Current or past profession: Audiologist/ Speech Therapist      Do you exercise?                 yes                     Type & how often? Water aerobics 3 x week      Advanced Directives      Do you have a living will? yes      Do you have a DNR form?        yes                         If not, do you want to discuss one?      Do you have signed POA/HPOA for forms? yes      Functional Status : completed by self      Do you have difficulty bathing or dressing yourself? no  Do you have difficulty preparing food or eating? no      Do you have difficulty managing your medications? No      Do you have difficulty managing your finances? no      Do  you have difficulty affording your medications? no   Social Determinants of Health   Financial Resource Strain: Not on file  Food Insecurity: Not on file  Transportation Needs: Not on file  Physical Activity: Not on file  Stress: Not on file  Social Connections: Not on file    reports that she has never smoked. She has never used smokeless tobacco. She reports current alcohol use of about 7.0 standard drinks of alcohol per week. She reports that she does not use drugs.  Functional Status Survey:    Family History  Problem Relation Age of Onset  . Uterine cancer Sister 28       uterine cancer  . COPD Maternal Grandfather   . Lung cancer Maternal Grandfather   . Heart failure Mother 4  . Pneumonia Father 18  . Dementia Father   . Uterine cancer Maternal Grandmother 62  . Other Sister        Guillain Barre syndrom  . Kidney cancer Son 79  . Dementia Paternal Grandmother   . Lung cancer Maternal Uncle   . Other Son        Familial Hypercholesterolemia  . Colon cancer Neg Hx   . Esophageal cancer Neg Hx   . Liver cancer Neg Hx   . Pancreatic cancer Neg Hx   . Rectal cancer Neg Hx   . Stomach cancer Neg Hx     Health Maintenance  Topic Date Due  . COVID-19 Vaccine (3 - Moderna risk 4-dose series) 02/21/2020  . INFLUENZA VACCINE  Completed  . DEXA SCAN  Completed  . TETANUS/TDAP  Discontinued  . PNA vac Low Risk Adult  Discontinued    No Known Allergies  Outpatient Encounter Medications as of 11/20/2020  Medication Sig  . Acetaminophen 500 MG capsule Take 500 mg by mouth 3 (three) times daily. 2 tabs  Three times daily  . bimatoprost (LUMIGAN) 0.01 % SOLN Place 1 drop into both eyes at bedtime.  . brimonidine (ALPHAGAN) 0.15 % ophthalmic solution Place 1 drop into both eyes 3 (three) times daily.  . cetirizine (ZYRTEC) 10 MG tablet Take 10 mg by mouth at bedtime.   . Cholecalciferol (VITAMIN D3) 50 MCG (2000 UT) CHEW Chew 1 capsule by mouth daily.  Marland Kitchen losartan  (COZAAR) 50 MG tablet TAKE 1 TABLET DAILY  . magnesium hydroxide (MILK OF MAGNESIA) 400 MG/5ML suspension Take by mouth daily as needed for mild constipation.  . methocarbamol (ROBAXIN) 500 MG tablet Take 0.5 tablets (250 mg total) by mouth every 6 (six) hours as needed for muscle spasms.  . nitrofurantoin (MACRODANTIN) 50 MG capsule Take 1 capsule (50 mg total) by mouth daily.  . ondansetron (ZOFRAN) 4 MG tablet Take 4 mg by mouth every 8 (eight) hours as needed for nausea or vomiting.  . polyethylene glycol (MIRALAX / GLYCOLAX) 17 g packet Take 17 g by mouth daily.  Marland Kitchen Propylene Glycol (SYSTANE BALANCE) 0.6 % SOLN Apply 1 drop to eye daily as needed.  . rosuvastatin (CRESTOR) 5 MG tablet TAKE 1 TABLET AT BEDTIME  . sennosides-docusate sodium (SENOKOT-S) 8.6-50 MG tablet Take 1 tablet by mouth daily.  . traMADol (ULTRAM) 50 MG tablet Take 0.5 tablets (25 mg total) by mouth every 6 (six) hours  as needed.  . [DISCONTINUED] eszopiclone (LUNESTA) 2 MG TABS tablet TAKE 1 TABLET AT BEDTIME ASNEEDED FOR SLEEP. TAKE     IMMEDIATELY BEFORE BEDTIME.  . [DISCONTINUED] predniSONE (STERAPRED UNI-PAK 21 TAB) 10 MG (21) TBPK tablet Take as directed   No facility-administered encounter medications on file as of 11/20/2020.    Review of Systems  Constitutional: Negative for chills, fever and malaise/fatigue.  HENT: Positive for hearing loss. Negative for congestion and sore throat.   Eyes: Negative for double vision.  Respiratory: Negative for cough and shortness of breath.   Cardiovascular: Negative for chest pain, palpitations and leg swelling.  Gastrointestinal: Positive for constipation and nausea. Negative for abdominal pain, blood in stool, diarrhea and melena.  Genitourinary: Negative for dysuria.       Urinary retention  Musculoskeletal: Positive for back pain and myalgias. Negative for falls.       No further falls  Skin: Negative for itching and rash.  Neurological: Negative for dizziness and  loss of consciousness.  Endo/Heme/Allergies: Bruises/bleeds easily.  Psychiatric/Behavioral: Negative for depression. The patient is not nervous/anxious and does not have insomnia.     Vitals:   11/20/20 1547  BP: 129/71  Pulse: 71  Temp: 98.1 F (36.7 C)  Weight: 185 lb 3.2 oz (84 kg)  Height: 5\' 4"  (1.626 m)   Body mass index is 31.79 kg/m. Physical Exam Vitals reviewed.  Constitutional:      Appearance: Normal appearance. She is obese.  HENT:     Head: Normocephalic and atraumatic.     Right Ear: External ear normal.     Left Ear: External ear normal.     Ears:     Comments: HOH, hearing aids    Nose: Nose normal.     Mouth/Throat:     Pharynx: Oropharynx is clear.  Eyes:     Conjunctiva/sclera: Conjunctivae normal.     Pupils: Pupils are equal, round, and reactive to light.     Comments: glasses  Cardiovascular:     Rate and Rhythm: Normal rate and regular rhythm.     Pulses: Normal pulses.     Heart sounds: Normal heart sounds.  Pulmonary:     Effort: Pulmonary effort is normal.     Breath sounds: No wheezing, rhonchi or rales.  Abdominal:     General: Bowel sounds are normal. There is no distension.     Palpations: Abdomen is soft.     Tenderness: There is no abdominal tenderness. There is no guarding or rebound.  Musculoskeletal:        General: Normal range of motion.     Cervical back: Neck supple.     Comments: Chronic nonpitting edema bilaterally; tender over left lower flank region and upper lumbar region   Lymphadenopathy:     Cervical: No cervical adenopathy.  Skin:    General: Skin is warm and dry.  Neurological:     General: No focal deficit present.     Mental Status: She is alert and oriented to person, place, and time.     Cranial Nerves: No cranial nerve deficit.     Motor: No weakness.     Gait: Gait abnormal.     Comments: Using walker  Psychiatric:        Mood and Affect: Mood normal.     Labs reviewed: Basic Metabolic  Panel: Recent Labs    01/03/20 0500  NA 142  K 4.3  CL 106  CO2 28*  BUN 32*  CREATININE 0.5  CALCIUM 9.6   Liver Function Tests: Recent Labs    01/03/20 0500  ALBUMIN 4.5   No results for input(s): LIPASE, AMYLASE in the last 8760 hours. No results for input(s): AMMONIA in the last 8760 hours. CBC: Recent Labs    01/03/20 0500  WBC 4.6  HGB 13.2  HCT 40  PLT 176   Cardiac Enzymes: No results for input(s): CKTOTAL, CKMB, CKMBINDEX, TROPONINI in the last 8760 hours. BNP: Invalid input(s): POCBNP No results found for: HGBA1C No results found for: TSH No results found for: VITAMINB12 No results found for: FOLATE No results found for: IRON, TIBC, FERRITIN  Imaging and Procedures obtained prior to SNF admission: MR Lumbar Spine w/o contrast  Result Date: 11/15/2020 CLINICAL DATA:  L1 compression fracture.  Back pain. EXAM: MRI LUMBAR SPINE WITHOUT CONTRAST TECHNIQUE: Multiplanar, multisequence MR imaging of the lumbar spine was performed. No intravenous contrast was administered. COMPARISON:  Radiographs 11/13/2020 and MRI from 10/21/2018 FINDINGS: Segmentation: Is The lowest lumbar type non-rib-bearing vertebra is labeled as L5. Alignment: 3 mm degenerative anterolisthesis at L4-5 at L5-S1. And 3 mm degenerative retrolisthesis Vertebrae: 55% compression fracture at L1 with 7 mm bony retropulsion as well as low T1 signal intensity compatible with sclerosis. Fluid signal intensity along the superior endplate indicating fracture margin. Edema extends slightly into the bases of the pedicles. Type 2 degenerative endplate findings at Y8-6, L3-4, L4-5, and L5-S1 with associated disc desiccation. There is new abnormal low T1 signal medially in the left twelfth rib on image 5 of series 100, raising suspicion for a left medial twelfth rib fracture. Degenerative facet edema bilaterally at L4-5. Conus medullaris and cauda equina: Conus extends to the L1 level. Conus and cauda equina appear  normal. Paraspinal and other soft tissues: Mild edema in the proximal left psoas muscle associated with the L1 fracture. Disc levels: T11-12: No impingement.  Right eccentric disc bulge. T12-L1: Moderate central narrowing of the thecal sac due to posterior bony retropulsion from the L1 compression fracture. L1-2: Borderline left foraminal stenosis due to facet spurring. L2-3: Mild left foraminal stenosis and borderline left subarticular lateral recess stenosis due to degenerative facet arthropathy, intervertebral spurring, and disc bulge. This is similar to the prior exam. L3-4: Mild right and borderline left foraminal stenosis along with mild right and borderline left subarticular lateral recess stenosis due to disc bulge, intervertebral spurring, and facet spurring. Appearance similar to prior. L4-5: Mild left and borderline right foraminal stenosis with borderline bilateral subarticular lateral recess stenosis due to disc uncovering and facet arthropathy. Appearance similar to prior. L5-S1: Mild to moderate bilateral foraminal stenosis due to intervertebral spurring, disc bulge, and facet arthropathy, similar to the prior exam. IMPRESSION: 1. Acute 55% compression fracture at L1 with 7 mm bony retropulsion causing moderate central narrowing of the thecal sac at this level. 2. New abnormal low T1 signal medially in the left twelfth rib, raising suspicion for a left medial twelfth rib fracture. 3. Lumbar spondylosis and degenerative disc disease, causing mild to moderate impingement at L5-S1, and mild impingement at L2-3, L3-4, and L4-5, similar to the prior exam at these levels. Electronically Signed   By: Van Clines M.D.   On: 11/15/2020 12:04    Assessment/Plan 1. Closed compression fracture of body of L1 vertebra (HCC) -did not show up on initial imagine and pt did not have a rehab f/u with me so I could not reassess her pain -she wound up seeing Dr. Lorin Mercy and imaging  showed she did have the  compression fx we all suspected she had to start with -cont pain regimen, therapy exercises, walker use, ideally should have meds administered so she gets them routinely  2. Senile purpura (HCC) -ongoing easy bruising due to advanced age  74. H/O fall -now about 6 wks ago  4. Myalgia -from spasms related to compression fx -cont muscle relaxer  5. Lumbar foraminal stenosis -longstanding, contributory also to pain  6. Venous insufficiency of both lower extremities -elevate feet at rest, if worsens, compression hose  7. Chronic constipation with overflow incontinence -cont prune juice and home regimen  8. Incomplete emptying of bladder -cont I/O caths as usual -requests high toilet for this  9. Decreased mobility -here for respite b/c of this and challenges in [place]  10. Nausea -add back prn zofran but if she takes her medication on time, this does not develop which is why mgt of her meds would help her  Family/ staff Communication: d/w nurse manager and AL nurse  Labs/tests ordered:  No new  Doloros Kwolek L. Estefani Bateson, D.O. [facility] [address], [place] [address] Cell Phone (Mon-Fri 8am-5pm):  [phone] On Call:  [phone] & follow prompts after 5pm & weekends Office Phone:  [phone] Office Fax:  [phone]

## 2020-11-21 ENCOUNTER — Encounter: Payer: Medicare Other | Admitting: Internal Medicine

## 2020-11-21 DIAGNOSIS — M810 Age-related osteoporosis without current pathological fracture: Secondary | ICD-10-CM | POA: Diagnosis not present

## 2020-11-21 DIAGNOSIS — M48061 Spinal stenosis, lumbar region without neurogenic claudication: Secondary | ICD-10-CM | POA: Diagnosis not present

## 2020-11-21 DIAGNOSIS — R2689 Other abnormalities of gait and mobility: Secondary | ICD-10-CM | POA: Diagnosis not present

## 2020-11-21 DIAGNOSIS — R278 Other lack of coordination: Secondary | ICD-10-CM | POA: Diagnosis not present

## 2020-11-21 DIAGNOSIS — Z9181 History of falling: Secondary | ICD-10-CM | POA: Diagnosis not present

## 2020-11-21 DIAGNOSIS — M6389 Disorders of muscle in diseases classified elsewhere, multiple sites: Secondary | ICD-10-CM | POA: Diagnosis not present

## 2020-11-23 ENCOUNTER — Other Ambulatory Visit: Payer: Self-pay | Admitting: Adult Health

## 2020-11-23 DIAGNOSIS — R278 Other lack of coordination: Secondary | ICD-10-CM | POA: Diagnosis not present

## 2020-11-23 DIAGNOSIS — G4700 Insomnia, unspecified: Secondary | ICD-10-CM

## 2020-11-23 DIAGNOSIS — M48061 Spinal stenosis, lumbar region without neurogenic claudication: Secondary | ICD-10-CM | POA: Diagnosis not present

## 2020-11-23 DIAGNOSIS — R2689 Other abnormalities of gait and mobility: Secondary | ICD-10-CM | POA: Diagnosis not present

## 2020-11-23 DIAGNOSIS — M6389 Disorders of muscle in diseases classified elsewhere, multiple sites: Secondary | ICD-10-CM | POA: Diagnosis not present

## 2020-11-23 DIAGNOSIS — M810 Age-related osteoporosis without current pathological fracture: Secondary | ICD-10-CM | POA: Diagnosis not present

## 2020-11-23 DIAGNOSIS — Z9181 History of falling: Secondary | ICD-10-CM | POA: Diagnosis not present

## 2020-11-23 MED ORDER — ESZOPICLONE 2 MG PO TABS: 2.0000 mg | ORAL_TABLET | Freq: Every day | ORAL | 0 refills | Status: DC

## 2020-11-26 ENCOUNTER — Non-Acute Institutional Stay (SKILLED_NURSING_FACILITY): Payer: Medicare Other | Admitting: Adult Health

## 2020-11-26 ENCOUNTER — Encounter: Payer: Self-pay | Admitting: Adult Health

## 2020-11-26 DIAGNOSIS — R278 Other lack of coordination: Secondary | ICD-10-CM | POA: Diagnosis not present

## 2020-11-26 DIAGNOSIS — Z9181 History of falling: Secondary | ICD-10-CM | POA: Diagnosis not present

## 2020-11-26 DIAGNOSIS — S32010A Wedge compression fracture of first lumbar vertebra, initial encounter for closed fracture: Secondary | ICD-10-CM | POA: Diagnosis not present

## 2020-11-26 DIAGNOSIS — R110 Nausea: Secondary | ICD-10-CM | POA: Diagnosis not present

## 2020-11-26 DIAGNOSIS — K5901 Slow transit constipation: Secondary | ICD-10-CM

## 2020-11-26 DIAGNOSIS — M6389 Disorders of muscle in diseases classified elsewhere, multiple sites: Secondary | ICD-10-CM | POA: Diagnosis not present

## 2020-11-26 DIAGNOSIS — M810 Age-related osteoporosis without current pathological fracture: Secondary | ICD-10-CM | POA: Diagnosis not present

## 2020-11-26 DIAGNOSIS — M48061 Spinal stenosis, lumbar region without neurogenic claudication: Secondary | ICD-10-CM | POA: Diagnosis not present

## 2020-11-26 DIAGNOSIS — R2689 Other abnormalities of gait and mobility: Secondary | ICD-10-CM | POA: Diagnosis not present

## 2020-11-26 MED ORDER — METHOCARBAMOL 500 MG PO TABS: 500.0000 mg | ORAL_TABLET | Freq: Two times a day (BID) | ORAL | 0 refills | Status: DC

## 2020-11-26 MED ORDER — TRAMADOL HCL 50 MG PO TABS: 50.0000 mg | ORAL_TABLET | Freq: Four times a day (QID) | ORAL | 0 refills | Status: DC | PRN

## 2020-11-26 MED ORDER — TRAMADOL HCL 50 MG PO TABS: 50.0000 mg | ORAL_TABLET | Freq: Two times a day (BID) | ORAL | 0 refills | Status: DC

## 2020-11-26 MED ORDER — METHOCARBAMOL 500 MG PO TABS: 500.0000 mg | ORAL_TABLET | Freq: Four times a day (QID) | ORAL | 0 refills | Status: DC | PRN

## 2020-11-26 NOTE — Progress Notes (Signed)
Location:  Occupational psychologist of Service:  SNF (31) Provider:   Cindi Carbon, ANP Theresa [phone]   Gayland Curry, DO  Patient Care Team: Gayland Curry, DO as PCP - General (Geriatric Medicine) Rolm Bookbinder, MD as Consulting Physician (Dermatology) Marybelle Killings, MD as Consulting Physician (Orthopedic Surgery) Jola Schmidt, MD as Consulting Physician (Ophthalmology) Milus Banister, MD as Attending Physician (Gastroenterology) Regal, Tamala Fothergill, DPM as Consulting Physician (Podiatry) Lucas Mallow, MD as Consulting Physician (Urology) Jodi Marble, MD as Consulting Physician (Otolaryngology)  Extended Emergency Contact Information Primary Emergency Contact: [name] Address: [address]          [place], [place] [address] [name] of [place] Phone: [phone] Relation: Son Secondary Emergency Contact: [name] Address: [address] [place] [address] [name] of [place] Mobile Phone: [phone] Relation: Son  Goals of care: Advanced Directive information Advanced Directives 11/20/2020  Does Patient Have a Medical Advance Directive? Yes  Type of Advance Directive Living will;Healthcare Power of Attorney  Does patient want to make changes to medical advance directive? No - Patient declined  Copy of [place] in Chart? Yes - validated most recent copy scanned in chart (See row information)     Chief Complaint  Patient presents with   Acute Visit    HPI:  Pt is a 86 y.o. female seen today for an acute visit for uncontrolled pain and nausea.  Ms. Gugel resides in the skilled rehab section due to increased functional needs and pain control. [place]. She is having flank and low back pain associated with an L1 compression (55%) with 10mm bony retropulsion causing moderate central narrowing of the thecal sac found on MRI on  12/2. The pain in her back has been an issue for two months. She has tried epidural injection by Dr. Ernestina Patches as well as prednisone.  She has also tried docaine patches, ultram 25 mg and Robaxin 250 mg with no relief. She has a hx of small bowel resection and constipation and takes miralax, senokot, and prune juice as needed (or MOM).  She uses it every 2-3 days although it is ordered daily. She is also experiencing nausea without vomiting. She is not certain if this is associated with the medication or severe pain. She feels very frustrated with her lack of progress. She was told by Dr Lorin Mercy that she is not a candidate for kyphoplasty and asked to see me to see what we can offer to help control her pain better. Her goal is to have improved pain and be able to return to [place]. She is now ambulatory with a walker but spends most of the day in her chair due to pain. There is no associated radicular pain, numbness, tingling or change in bowel habits.  Past Medical History:  Diagnosis Date   Allergy    Anxiety    Arthritis    Bursitis    right leg   Family history of kidney cancer    Family history of lung cancer    Family history of uterine cancer    GERD (gastroesophageal reflux disease)    History of blood transfusion    History of uterine cancer 01/19/2019   Hyperlipidemia    Hypertension    Insomnia    Non Hodgkin's lymphoma ([facility]) 2017-2019   cured   Osteopenia  MILD   Urine retention    Uterine cancer ([facility])    at age 33   Past Surgical History:  Procedure Laterality Date   ABDOMINAL HYSTERECTOMY     AGE 38   APPENDECTOMY     AGE 55   BLADDER SURGERY     Catarct Surgery     ECTOPIC PREGNANCY SURGERY     age 32   LAPAROSCOPIC SMALL BOWEL RESECTION     NM PET DX LYMPHOMA  10/2018   IN REMISSION   occular pressure     OTHER SURGICAL HISTORY     REPLACEMENT TOTAL KNEE BILATERAL     TONSILLECTOMY AND ADENOIDECTOMY     age 69 or 34   UTERINE CANCER SURGERY      age 64- located in vagina    No Known Allergies  Outpatient Encounter Medications as of 11/26/2020  Medication Sig   Acetaminophen 500 MG capsule Take 500 mg by mouth 3 (three) times daily. 2 tabs  Three times daily   bimatoprost (LUMIGAN) 0.01 % SOLN Place 1 drop into both eyes at bedtime.   brimonidine (ALPHAGAN) 0.15 % ophthalmic solution Place 1 drop into both eyes 3 (three) times daily.   cetirizine (ZYRTEC) 10 MG tablet Take 10 mg by mouth at bedtime.    Cholecalciferol (VITAMIN D3) 50 MCG (2000 UT) CHEW Chew 1 capsule by mouth daily.   eszopiclone (LUNESTA) 2 MG TABS tablet Take 1 tablet (2 mg total) by mouth at bedtime. Take immediately before bedtime   losartan (COZAAR) 50 MG tablet TAKE 1 TABLET DAILY   magnesium hydroxide (MILK OF MAGNESIA) 400 MG/5ML suspension Take by mouth daily as needed for mild constipation.   methocarbamol (ROBAXIN) 500 MG tablet Take 1 tablet (500 mg total) by mouth in the morning and at bedtime.   methocarbamol (ROBAXIN) 500 MG tablet Take 1 tablet (500 mg total) by mouth every 6 (six) hours as needed for muscle spasms.   nitrofurantoin (MACRODANTIN) 50 MG capsule Take 1 capsule (50 mg total) by mouth daily.   ondansetron (ZOFRAN) 4 MG tablet Take 4 mg by mouth every 8 (eight) hours as needed for nausea or vomiting.   polyethylene glycol (MIRALAX / GLYCOLAX) 17 g packet Take 17 g by mouth daily.   Propylene Glycol (SYSTANE BALANCE) 0.6 % SOLN Apply 1 drop to eye daily as needed.   rosuvastatin (CRESTOR) 5 MG tablet TAKE 1 TABLET AT BEDTIME   sennosides-docusate sodium (SENOKOT-S) 8.6-50 MG tablet Take 1 tablet by mouth daily.   traMADol (ULTRAM) 50 MG tablet Take 1 tablet (50 mg total) by mouth every 6 (six) hours as needed.   traMADol (ULTRAM) 50 MG tablet Take 1 tablet (50 mg total) by mouth 2 (two) times daily.   [DISCONTINUED] methocarbamol (ROBAXIN) 500 MG tablet Take 0.5 tablets (250 mg total) by mouth every 6 (six) hours as  needed for muscle spasms.   [DISCONTINUED] methocarbamol (ROBAXIN) 500 MG tablet Take 500 mg by mouth in the morning and at bedtime.   [DISCONTINUED] methocarbamol (ROBAXIN) 500 MG tablet Take 500 mg by mouth every 6 (six) hours as needed for muscle spasms.   [DISCONTINUED] traMADol (ULTRAM) 50 MG tablet Take 0.5 tablets (25 mg total) by mouth every 6 (six) hours as needed. (Patient taking differently: Take 50 mg by mouth every 6 (six) hours as needed.)   [DISCONTINUED] traMADol (ULTRAM) 50 MG tablet Take 50 mg by mouth every 6 (six) hours as needed.   [DISCONTINUED] traMADol (ULTRAM) 50  MG tablet Take by mouth 2 (two) times daily.   No facility-administered encounter medications on file as of 11/26/2020.    Review of Systems  Constitutional: Negative for activity change, appetite change, chills, diaphoresis, fatigue, fever and unexpected weight change.  HENT: Negative for congestion.   Respiratory: Negative for cough, shortness of breath and wheezing.   Cardiovascular: Positive for leg swelling. Negative for chest pain and palpitations.  Gastrointestinal: Positive for constipation and nausea. Negative for abdominal distention, abdominal pain, blood in stool, diarrhea, rectal pain and vomiting.  Genitourinary: Negative for difficulty urinating and dysuria.  Musculoskeletal: Positive for back pain and gait problem. Negative for arthralgias, joint swelling and myalgias.  Skin: Negative for wound.  Neurological: Negative for dizziness, tremors, seizures, syncope, facial asymmetry, speech difficulty, weakness, light-headedness, numbness and headaches.  Psychiatric/Behavioral: Negative for agitation, behavioral problems and confusion.    Immunization History  Administered Date(s) Administered   Influenza, High Dose Seasonal PF 09/26/2014, 08/17/2015, 08/19/2016, 08/20/2017, 08/24/2018, 09/23/2019, 10/12/2020   Moderna Sars-Covid-2 Vaccination 12/27/2019, 01/24/2020   Pneumococcal  Conjugate-13 09/07/2014   Pneumococcal Polysaccharide-23 09/21/2007   Tdap 05/23/2010   Zoster 10/24/2005   Zoster Recombinat (Shingrix) 09/22/2017, 11/25/2017   Pertinent  Health Maintenance Due  Topic Date Due   INFLUENZA VACCINE  Completed   DEXA SCAN  Completed   PNA vac Low Risk Adult  Discontinued   Fall Risk  10/03/2020 07/25/2020 01/11/2020 12/15/2019 10/19/2019  Falls in the past year? 1 0 0 0 0  Number falls in past yr: 0 0 0 0 0  Injury with Fall? 1 0 0 0 0  Risk for fall due to : - - - - -  Follow up - - - - -   Functional Status Survey:    There were no vitals filed for this visit. There is no height or weight on file to calculate BMI. Physical Exam Vitals and nursing note reviewed.  Constitutional:      General: She is not in acute distress.    Appearance: She is not diaphoretic.  HENT:     Head: Normocephalic and atraumatic.  Neck:     Vascular: No JVD.  Cardiovascular:     Rate and Rhythm: Normal rate and regular rhythm.     Heart sounds: No murmur heard.   Pulmonary:     Effort: Pulmonary effort is normal. No respiratory distress.     Breath sounds: Normal breath sounds. No wheezing.  Abdominal:     General: Bowel sounds are normal. There is no distension.     Palpations: Abdomen is soft.     Tenderness: There is no abdominal tenderness.  Musculoskeletal:        General: No swelling, tenderness, deformity or signs of injury.     Comments: Trace edema to BLE +CMS to BLE No spinous processes tenderness  Skin:    General: Skin is warm and dry.  Neurological:     Mental Status: She is alert and oriented to person, place, and time.     Labs reviewed: Recent Labs    01/03/20 0500  NA 142  K 4.3  CL 106  CO2 28*  BUN 32*  CREATININE 0.5  CALCIUM 9.6   Recent Labs    01/03/20 0500  ALBUMIN 4.5   Recent Labs    01/03/20 0500  WBC 4.6  HGB 13.2  HCT 40  PLT 176   No results found for: TSH No results found for: HGBA1C Lab  Results  Component  Value Date   CHOL 164 01/03/2020   HDL 64 01/03/2020   LDLCALC 76 01/03/2020   TRIG 116 01/03/2020   CHOLHDL 3 08/24/2018    Significant Diagnostic Results in last 30 days:  Epidural Steroid injection  Result Date: 10/29/2020 Magnus Sinning, MD     10/30/2020  6:01 AM Lumbosacral Transforaminal Epidural Steroid Injection - Sub-Pedicular Approach with Fluoroscopic Guidance Patient: Ainara Eldridge GYJEHUDJS     Date of Birth: 13-Dec-1932 MRN: [id number] PCP: Gayland Curry, DO     Visit Date: 10/29/2020  Universal Protocol:   Date/Time: 10/29/2020 Consent Given By: the patient Position: PRONE Additional Comments: Vital signs were monitored before and after the procedure. Patient was prepped and draped in the usual sterile fashion. The correct patient, procedure, and site was verified. Injection Procedure Details: Procedure diagnoses: 1. Lumbar radiculopathy   Meds Administered: Meds ordered this encounter Medications  methylPREDNISolone acetate (DEPO-MEDROL) injection 80 mg Laterality: Bilateral Location/Site: L4-L5 Needle:5.0 in., 22 ga.  Short bevel or Quincke spinal needle Needle Placement: Transforaminal Findings:   -Comments: Excellent flow of contrast along the nerve, nerve root and into the epidural space. Procedure Details: After squaring off the end-plates to get a true AP view, the C-arm was positioned so that an oblique view of the foramen as noted above was visualized. The target area is just inferior to the "nose of the scotty dog" or sub pedicular. The soft tissues overlying this structure were infiltrated with 2-3 ml. of 1% Lidocaine without Epinephrine. The spinal needle was inserted toward the target using a "trajectory" view along the fluoroscope beam.  Under AP and lateral visualization, the needle was advanced so it did not puncture dura and was located close the 6 O'Clock position of the pedical in AP tracterory. Biplanar projections were used to confirm position.  Aspiration was confirmed to be negative for CSF and/or blood. A 1-2 ml. volume of Isovue-250 was injected and flow of contrast was noted at each level. Radiographs were obtained for documentation purposes. After attaining the desired flow of contrast documented above, a 0.5 to 1.0 ml test dose of 0.25% Marcaine was injected into each respective transforaminal space.  The patient was observed for 90 seconds post injection.  After no sensory deficits were reported, and normal lower extremity motor function was noted,   the above injectate was administered so that equal amounts of the injectate were placed at each foramen (level) into the transforaminal epidural space. Additional Comments: The patient tolerated the procedure well Dressing: 2 x 2 sterile gauze and Band-Aid  Post-procedure details: Patient was observed during the procedure. Post-procedure instructions were reviewed. Patient left the clinic in stable condition.   MR Lumbar Spine w/o contrast  Result Date: 11/15/2020 CLINICAL DATA:  L1 compression fracture.  Back pain. EXAM: MRI LUMBAR SPINE WITHOUT CONTRAST TECHNIQUE: Multiplanar, multisequence MR imaging of the lumbar spine was performed. No intravenous contrast was administered. COMPARISON:  Radiographs 11/13/2020 and MRI from 10/21/2018 FINDINGS: Segmentation: Is The lowest lumbar type non-rib-bearing vertebra is labeled as L5. Alignment: 3 mm degenerative anterolisthesis at L4-5 at L5-S1. And 3 mm degenerative retrolisthesis Vertebrae: 55% compression fracture at L1 with 7 mm bony retropulsion as well as low T1 signal intensity compatible with sclerosis. Fluid signal intensity along the superior endplate indicating fracture margin. Edema extends slightly into the bases of the pedicles. Type 2 degenerative endplate findings at Y8-5, L3-4, L4-5, and L5-S1 with associated disc desiccation. There is new abnormal low T1 signal medially in the left  twelfth rib on image 5 of series 100, raising suspicion  for a left medial twelfth rib fracture. Degenerative facet edema bilaterally at L4-5. Conus medullaris and cauda equina: Conus extends to the L1 level. Conus and cauda equina appear normal. Paraspinal and other soft tissues: Mild edema in the proximal left psoas muscle associated with the L1 fracture. Disc levels: T11-12: No impingement.  Right eccentric disc bulge. T12-L1: Moderate central narrowing of the thecal sac due to posterior bony retropulsion from the L1 compression fracture. L1-2: Borderline left foraminal stenosis due to facet spurring. L2-3: Mild left foraminal stenosis and borderline left subarticular lateral recess stenosis due to degenerative facet arthropathy, intervertebral spurring, and disc bulge. This is similar to the prior exam. L3-4: Mild right and borderline left foraminal stenosis along with mild right and borderline left subarticular lateral recess stenosis due to disc bulge, intervertebral spurring, and facet spurring. Appearance similar to prior. L4-5: Mild left and borderline right foraminal stenosis with borderline bilateral subarticular lateral recess stenosis due to disc uncovering and facet arthropathy. Appearance similar to prior. L5-S1: Mild to moderate bilateral foraminal stenosis due to intervertebral spurring, disc bulge, and facet arthropathy, similar to the prior exam. IMPRESSION: 1. Acute 55% compression fracture at L1 with 7 mm bony retropulsion causing moderate central narrowing of the thecal sac at this level. 2. New abnormal low T1 signal medially in the left twelfth rib, raising suspicion for a left medial twelfth rib fracture. 3. Lumbar spondylosis and degenerative disc disease, causing mild to moderate impingement at L5-S1, and mild impingement at L2-3, L3-4, and L4-5, similar to the prior exam at these levels. Electronically Signed   By: Van Clines M.D.   On: 11/15/2020 12:04   XR C-ARM NO REPORT  Result Date: 10/29/2020 Please see Notes tab for imaging  impression.  XR Lumbar Spine 2-3 Views  Result Date: 11/19/2020 AP lateral lumbar spine x-rays are obtained and reviewed.  This shows degenerative anterolisthesis grade 1.5 at L4-5.  There is L1 compression fracture with 55% compression.  Comparison to 10/16/2020 images shows progression from 50% to 55% Impression: L1 compression fracture.  Progression as above.   Assessment/Plan 1. Closed compression fracture of body of L1 vertebra (HCC) No improvement in terms of pain and functional gains Increase Tramadol to 50 mg bid and 50 mg q 6 hrs prn pain Call for help when getting OOB  Increase Robaxin 500 mg bid and q 6 prn pain F/U with Dr. Lorin Mercy in Jan 2022  2. Nausea Unclear if this is due to unrelieved pain or med effect If worse after dose increase would consider changing to neurontin  Schedule Zofran 4 mg each morning and continue prn dosing.   3. Slow transit constipation Controlled at this time on Miralax and senokot which are ordered daily but she only takes them every 3 days depending on how she feels Could consider Movantik if needed     Family/ staff Communication: discussed with resident for 1 hr   I spent time educating her on the types of meds available along with side effects. She may experience increased drowsiness and constipation with this new regimen but due to the severity of her pain she is willing to try this. If no improvement by Friday 12/17 she should let [facility] know for further recommendations   Labs/tests ordered:  NA

## 2020-11-30 ENCOUNTER — Non-Acute Institutional Stay (SKILLED_NURSING_FACILITY): Payer: Medicare Other | Admitting: Adult Health

## 2020-11-30 DIAGNOSIS — R110 Nausea: Secondary | ICD-10-CM

## 2020-11-30 DIAGNOSIS — M810 Age-related osteoporosis without current pathological fracture: Secondary | ICD-10-CM | POA: Diagnosis not present

## 2020-11-30 DIAGNOSIS — Z9181 History of falling: Secondary | ICD-10-CM | POA: Diagnosis not present

## 2020-11-30 DIAGNOSIS — M6389 Disorders of muscle in diseases classified elsewhere, multiple sites: Secondary | ICD-10-CM | POA: Diagnosis not present

## 2020-11-30 DIAGNOSIS — M48061 Spinal stenosis, lumbar region without neurogenic claudication: Secondary | ICD-10-CM | POA: Diagnosis not present

## 2020-11-30 DIAGNOSIS — S32010A Wedge compression fracture of first lumbar vertebra, initial encounter for closed fracture: Secondary | ICD-10-CM

## 2020-11-30 DIAGNOSIS — R2689 Other abnormalities of gait and mobility: Secondary | ICD-10-CM | POA: Diagnosis not present

## 2020-11-30 DIAGNOSIS — R278 Other lack of coordination: Secondary | ICD-10-CM | POA: Diagnosis not present

## 2020-11-30 NOTE — Progress Notes (Signed)
Location:  [facility] of Service:  SNF (31) Provider:   Cindi Carbon, ANP [place] [phone]   Gayland Curry, DO  Patient Care Team: Gayland Curry, DO as PCP - General (Geriatric Medicine) Rolm Bookbinder, MD as Consulting Physician (Dermatology) Marybelle Killings, MD as Consulting Physician (Orthopedic Surgery) Jola Schmidt, MD as Consulting Physician (Ophthalmology) Milus Banister, MD as Attending Physician (Gastroenterology) Regal, Tamala Fothergill, DPM as Consulting Physician (Podiatry) Lucas Mallow, MD as Consulting Physician (Urology) Jodi Marble, MD as Consulting Physician (Otolaryngology)  Extended Emergency Contact Information Primary Emergency Contact: [name] Address: [address]          [place],  [address] [name] of [place] Phone: [phone] Relation: Son Secondary Emergency Contact: [name] Address: [address] [place] [address] [name] of [place] Mobile Phone: [phone] Relation: Son  Goals of care: Advanced Directive information Advanced Directives 11/20/2020  Does Patient Have a Medical Advance Directive? Yes  Type of Advance Directive Living will;Healthcare Power of Attorney  Does patient want to make changes to medical advance directive? No - Patient declined  Copy of [place] in Chart? Yes - validated most recent copy scanned in chart (See row information)     Chief Complaint  Patient presents with   Acute Visit    F/u back pain    HPI:  Pt is a 86 y.o. female seen today for an acute visit for f/u regarding back pain.  Ms. Custodio resides in the skilled rehab section due to increased functional needs and pain control. [place]. She was having flank and low back pain associated with an L1 compression (55%) with 10mm bony retropulsion causing moderate central narrowing of the thecal sac  found on MRI on 12/2. The pain in her back had been an issue for two months. She has tried epidural injection by Dr. Ernestina Patches as well as prednisone.  She has also tried docaine patches, ultram 25 mg and Robaxin 250 mg with no relief.I saw her earlier in the week and scheduled Ultram 50 mg bid and Robaxin 500 mg bid. She reports her pain is virtually gone. We also scheduled zofran each morning for nausea and she reports this is working as well. She is wondering when she can go home and when she can stop calling for help when she gets up.   Past Medical History:  Diagnosis Date   Allergy    Anxiety    Arthritis    Bursitis    right leg   Family history of kidney cancer    Family history of lung cancer    Family history of uterine cancer    GERD (gastroesophageal reflux disease)    History of blood transfusion    History of uterine cancer 01/19/2019   Hyperlipidemia    Hypertension    Insomnia    Non Hodgkin's lymphoma ([place]) 2017-2019   cured   Osteopenia    MILD   Urine retention    Uterine cancer ([place])    at age 77   Past Surgical History:  Procedure Laterality Date   ABDOMINAL HYSTERECTOMY     AGE 49   APPENDECTOMY     AGE 59   BLADDER SURGERY     Catarct Surgery     ECTOPIC PREGNANCY SURGERY     age 89  LAPAROSCOPIC SMALL BOWEL RESECTION     NM PET DX LYMPHOMA  10/2018   IN REMISSION   occular pressure     OTHER SURGICAL HISTORY     REPLACEMENT TOTAL KNEE BILATERAL     TONSILLECTOMY AND ADENOIDECTOMY     age 47 or 62   UTERINE CANCER SURGERY     age 59- located in vagina    No Known Allergies  Outpatient Encounter Medications as of 11/30/2020  Medication Sig   Acetaminophen 500 MG capsule Take 500 mg by mouth 3 (three) times daily. 2 tabs  Three times daily   bimatoprost (LUMIGAN) 0.01 % SOLN Place 1 drop into both eyes at bedtime.   brimonidine (ALPHAGAN) 0.15 % ophthalmic solution Place 1 drop into both eyes 3 (three) times daily.    cetirizine (ZYRTEC) 10 MG tablet Take 10 mg by mouth at bedtime.    Cholecalciferol (VITAMIN D3) 50 MCG (2000 UT) CHEW Chew 1 capsule by mouth daily.   eszopiclone (LUNESTA) 2 MG TABS tablet Take 1 tablet (2 mg total) by mouth at bedtime. Take immediately before bedtime   losartan (COZAAR) 50 MG tablet TAKE 1 TABLET DAILY   magnesium hydroxide (MILK OF MAGNESIA) 400 MG/5ML suspension Take by mouth daily as needed for mild constipation.   methocarbamol (ROBAXIN) 500 MG tablet Take 1 tablet (500 mg total) by mouth in the morning and at bedtime.   methocarbamol (ROBAXIN) 500 MG tablet Take 1 tablet (500 mg total) by mouth every 6 (six) hours as needed for muscle spasms.   nitrofurantoin (MACRODANTIN) 50 MG capsule Take 1 capsule (50 mg total) by mouth daily.   ondansetron (ZOFRAN) 4 MG tablet Take 4 mg by mouth every 8 (eight) hours as needed for nausea or vomiting.   polyethylene glycol (MIRALAX / GLYCOLAX) 17 g packet Take 17 g by mouth daily.   Propylene Glycol (SYSTANE BALANCE) 0.6 % SOLN Apply 1 drop to eye daily as needed.   rosuvastatin (CRESTOR) 5 MG tablet TAKE 1 TABLET AT BEDTIME   sennosides-docusate sodium (SENOKOT-S) 8.6-50 MG tablet Take 1 tablet by mouth daily.   traMADol (ULTRAM) 50 MG tablet Take 1 tablet (50 mg total) by mouth every 6 (six) hours as needed.   traMADol (ULTRAM) 50 MG tablet Take 1 tablet (50 mg total) by mouth 2 (two) times daily.   No facility-administered encounter medications on file as of 11/30/2020.    Review of Systems  Constitutional: Negative for chills, diaphoresis, fever, malaise/fatigue and weight loss.  Respiratory: Negative for cough, hemoptysis, sputum production and wheezing.   Cardiovascular: Positive for leg swelling. Negative for chest pain and PND.  Gastrointestinal: Positive for constipation (chronic controlled). Negative for abdominal pain, diarrhea, nausea and vomiting.  Genitourinary: Negative for dysuria and urgency.   Musculoskeletal: Positive for back pain. Negative for falls, joint pain, myalgias and neck pain.  Skin: Negative for itching and rash.  Neurological: Negative for weakness.  Psychiatric/Behavioral: Negative for depression and memory loss.     Immunization History  Administered Date(s) Administered   Influenza, High Dose Seasonal PF 09/26/2014, 08/17/2015, 08/19/2016, 08/20/2017, 08/24/2018, 09/23/2019, 10/12/2020   Moderna Sars-Covid-2 Vaccination 12/27/2019, 01/24/2020   Pneumococcal Conjugate-13 09/07/2014   Pneumococcal Polysaccharide-23 09/21/2007   Tdap 05/23/2010   Zoster 10/24/2005   Zoster Recombinat (Shingrix) 09/22/2017, 11/25/2017   Pertinent  Health Maintenance Due  Topic Date Due   INFLUENZA VACCINE  Completed   DEXA SCAN  Completed   PNA vac Low Risk Adult  Discontinued  Fall Risk  10/03/2020 07/25/2020 01/11/2020 12/15/2019 10/19/2019  Falls in the past year? 1 0 0 0 0  Number falls in past yr: 0 0 0 0 0  Injury with Fall? 1 0 0 0 0  Risk for fall due to : - - - - -  Follow up - - - - -   Functional Status Survey:    There were no vitals filed for this visit. There is no height or weight on file to calculate BMI. Physical Exam Vitals and nursing note reviewed.  Constitutional:      General: She is not in acute distress.    Appearance: She is not diaphoretic.  HENT:     Head: Normocephalic and atraumatic.  Neck:     Vascular: No JVD.  Cardiovascular:     Rate and Rhythm: Normal rate and regular rhythm.     Heart sounds: No murmur heard.   Pulmonary:     Effort: Pulmonary effort is normal. No respiratory distress.     Breath sounds: Normal breath sounds. No wheezing.  Abdominal:     General: Bowel sounds are normal. There is no distension.     Palpations: Abdomen is soft.     Tenderness: There is no abdominal tenderness.  Musculoskeletal:        General: No swelling, tenderness, deformity or signs of injury.     Comments: Trace edema to  BLE +CMS to BLE No spinous processes tenderness  Skin:    General: Skin is warm and dry.  Neurological:     Mental Status: She is alert and oriented to person, place, and time.     Labs reviewed: Recent Labs    01/03/20 0500  NA 142  K 4.3  CL 106  CO2 28*  BUN 32*  CREATININE 0.5  CALCIUM 9.6   Recent Labs    01/03/20 0500  ALBUMIN 4.5   Recent Labs    01/03/20 0500  WBC 4.6  HGB 13.2  HCT 40  PLT 176   No results found for: TSH No results found for: HGBA1C Lab Results  Component Value Date   CHOL 164 01/03/2020   HDL 64 01/03/2020   LDLCALC 76 01/03/2020   TRIG 116 01/03/2020   CHOLHDL 3 08/24/2018    Significant Diagnostic Results in last 30 days:  MR Lumbar Spine w/o contrast  Result Date: 11/15/2020 CLINICAL DATA:  L1 compression fracture.  Back pain. EXAM: MRI LUMBAR SPINE WITHOUT CONTRAST TECHNIQUE: Multiplanar, multisequence MR imaging of the lumbar spine was performed. No intravenous contrast was administered. COMPARISON:  Radiographs 11/13/2020 and MRI from 10/21/2018 FINDINGS: Segmentation: Is The lowest lumbar type non-rib-bearing vertebra is labeled as L5. Alignment: 3 mm degenerative anterolisthesis at L4-5 at L5-S1. And 3 mm degenerative retrolisthesis Vertebrae: 55% compression fracture at L1 with 7 mm bony retropulsion as well as low T1 signal intensity compatible with sclerosis. Fluid signal intensity along the superior endplate indicating fracture margin. Edema extends slightly into the bases of the pedicles. Type 2 degenerative endplate findings at M2-5, L3-4, L4-5, and L5-S1 with associated disc desiccation. There is new abnormal low T1 signal medially in the left twelfth rib on image 5 of series 100, raising suspicion for a left medial twelfth rib fracture. Degenerative facet edema bilaterally at L4-5. Conus medullaris and cauda equina: Conus extends to the L1 level. Conus and cauda equina appear normal. Paraspinal and other soft tissues: Mild  edema in the proximal left psoas muscle associated with the L1  fracture. Disc levels: T11-12: No impingement.  Right eccentric disc bulge. T12-L1: Moderate central narrowing of the thecal sac due to posterior bony retropulsion from the L1 compression fracture. L1-2: Borderline left foraminal stenosis due to facet spurring. L2-3: Mild left foraminal stenosis and borderline left subarticular lateral recess stenosis due to degenerative facet arthropathy, intervertebral spurring, and disc bulge. This is similar to the prior exam. L3-4: Mild right and borderline left foraminal stenosis along with mild right and borderline left subarticular lateral recess stenosis due to disc bulge, intervertebral spurring, and facet spurring. Appearance similar to prior. L4-5: Mild left and borderline right foraminal stenosis with borderline bilateral subarticular lateral recess stenosis due to disc uncovering and facet arthropathy. Appearance similar to prior. L5-S1: Mild to moderate bilateral foraminal stenosis due to intervertebral spurring, disc bulge, and facet arthropathy, similar to the prior exam. IMPRESSION: 1. Acute 55% compression fracture at L1 with 7 mm bony retropulsion causing moderate central narrowing of the thecal sac at this level. 2. New abnormal low T1 signal medially in the left twelfth rib, raising suspicion for a left medial twelfth rib fracture. 3. Lumbar spondylosis and degenerative disc disease, causing mild to moderate impingement at L5-S1, and mild impingement at L2-3, L3-4, and L4-5, similar to the prior exam at these levels. Electronically Signed   By: Van Clines M.D.   On: 11/15/2020 12:04   XR Lumbar Spine 2-3 Views  Result Date: 11/19/2020 AP lateral lumbar spine x-rays are obtained and reviewed.  This shows degenerative anterolisthesis grade 1.5 at L4-5.  There is L1 compression fracture with 55% compression.  Comparison to 10/16/2020 images shows progression from 50% to 55% Impression: L1  compression fracture.  Progression as above.   Assessment/Plan 1. Closed compression fracture of body of L1 vertebra (HCC) Significant improvement in pain and physical function with Robaxin 500 mg bid and Ultram 50 mg bid  Continue to work with OT May get up without help but needs to continue to use her walker Tentative d/c date 12/27 F/U with Dr. Lorin Mercy in Jan 2022 We discussed that this regimen is hopefully not permanent and after a period of weeks she could try to taper her meds . She should continue her current regimen until she follows up with Dr. Lorin Mercy  2. Nausea Improved Unclear if this is due to unrelieved pain or med effect Continue Zofran 4 mg each morning.   Labs/tests ordered:  NA

## 2020-12-03 ENCOUNTER — Encounter: Payer: Self-pay | Admitting: Adult Health

## 2020-12-03 DIAGNOSIS — R2689 Other abnormalities of gait and mobility: Secondary | ICD-10-CM | POA: Diagnosis not present

## 2020-12-03 DIAGNOSIS — Z9181 History of falling: Secondary | ICD-10-CM | POA: Diagnosis not present

## 2020-12-03 DIAGNOSIS — M810 Age-related osteoporosis without current pathological fracture: Secondary | ICD-10-CM | POA: Diagnosis not present

## 2020-12-03 DIAGNOSIS — M6389 Disorders of muscle in diseases classified elsewhere, multiple sites: Secondary | ICD-10-CM | POA: Diagnosis not present

## 2020-12-03 DIAGNOSIS — M48061 Spinal stenosis, lumbar region without neurogenic claudication: Secondary | ICD-10-CM | POA: Diagnosis not present

## 2020-12-03 DIAGNOSIS — R278 Other lack of coordination: Secondary | ICD-10-CM | POA: Diagnosis not present

## 2020-12-04 ENCOUNTER — Encounter: Payer: Self-pay | Admitting: Internal Medicine

## 2020-12-04 ENCOUNTER — Non-Acute Institutional Stay (SKILLED_NURSING_FACILITY): Payer: Medicare Other | Admitting: Internal Medicine

## 2020-12-04 DIAGNOSIS — Z9181 History of falling: Secondary | ICD-10-CM | POA: Diagnosis not present

## 2020-12-04 DIAGNOSIS — R278 Other lack of coordination: Secondary | ICD-10-CM | POA: Diagnosis not present

## 2020-12-04 DIAGNOSIS — S32010A Wedge compression fracture of first lumbar vertebra, initial encounter for closed fracture: Secondary | ICD-10-CM | POA: Diagnosis not present

## 2020-12-04 DIAGNOSIS — M6389 Disorders of muscle in diseases classified elsewhere, multiple sites: Secondary | ICD-10-CM | POA: Diagnosis not present

## 2020-12-04 DIAGNOSIS — K5901 Slow transit constipation: Secondary | ICD-10-CM

## 2020-12-04 DIAGNOSIS — M810 Age-related osteoporosis without current pathological fracture: Secondary | ICD-10-CM | POA: Diagnosis not present

## 2020-12-04 DIAGNOSIS — R2689 Other abnormalities of gait and mobility: Secondary | ICD-10-CM | POA: Diagnosis not present

## 2020-12-04 DIAGNOSIS — R110 Nausea: Secondary | ICD-10-CM | POA: Diagnosis not present

## 2020-12-04 DIAGNOSIS — M48061 Spinal stenosis, lumbar region without neurogenic claudication: Secondary | ICD-10-CM | POA: Diagnosis not present

## 2020-12-04 NOTE — Progress Notes (Signed)
Location:  [facility] Room Number: 159 Place of Service:  SNF (31) Provider:  Eva Vallee L. Mariea Clonts, D.O., C.M.D.  Gayland Curry, DO  Patient Care Team: Gayland Curry, DO as PCP - General (Geriatric Medicine) Rolm Bookbinder, MD as Consulting Physician (Dermatology) Marybelle Killings, MD as Consulting Physician (Orthopedic Surgery) Jola Schmidt, MD as Consulting Physician (Ophthalmology) Milus Banister, MD as Attending Physician (Gastroenterology) Regal, Tamala Fothergill, DPM as Consulting Physician (Podiatry) Lucas Mallow, MD as Consulting Physician (Urology) Jodi Marble, MD as Consulting Physician (Otolaryngology)  Extended Emergency Contact Information Primary Emergency Contact: [name] Address: [address]          [place], [place] [address] [name] of [place] Phone: [phone] Relation: Son Secondary Emergency Contact: [name] Address: [address] [place] [address] [name] of [place] Mobile Phone: [phone] Relation: Son  Code Status:  DNR Goals of care: Advanced Directive information Advanced Directives 12/04/2020  Does Patient Have a Medical Advance Directive? Yes  Type of Advance Directive Living will;Healthcare Power of Attorney  Does patient want to make changes to medical advance directive? -  Copy of [place] in Chart? Yes - validated most recent copy scanned in chart (See row information)   Chief Complaint  Patient presents with  . Acute Visit    HPI:  Pt is a 86 y.o. female seen today for an acute visit at the request of patient and her son for continued back pain.  Mrs. Mcgowen is back in rehab after she attempted to stay in [place] respite but was unable to take care of herself due to pain, could not get an elevated toilet seat and needed more supervision.  She is now able to change positions in her recliner more comfortably though still miserable  to be straight up and down in a wheelchair for any duration like for her appts.  She is able to walk in her room but reports being unable to shower for herself or make a cup of coffee in its entirety to where she could be home.  She is insisting upon a trial of increased pain medication to see if she can make more progress, sleep through the night and be without pain.    We extensively reviewed that typically compression fractures still have some pain despite pain medications and that thee are weaned as function and pain improve.  We also discussed her is for bowel obstruction which she currently insists is irrelevant.  She has been getting alternating doses of robaxin 500mg  and tramadol 25mg  q3h.  She now requests tramadol 75mg  and robabaxin 500mg  together every 6 hrs (this was a compromised as she wanted every 3).  We also adjusted her bowel regimen closer to what she did at home at her request.  Of note, she graduated from PT.  OT will resume working with her in the new year.  Her son and I do have concerns about her fall risk while on all of these medications especially in [place].  Past Medical History:  Diagnosis Date  . Allergy   . Anxiety   . Arthritis   . Bursitis    right leg  . Family history of kidney cancer   . Family history of lung cancer   . Family history of uterine cancer   . GERD (gastroesophageal reflux disease)   . History of blood transfusion   .  History of uterine cancer 01/19/2019  . Hyperlipidemia   . Hypertension   . Insomnia   . Non Hodgkin's lymphoma ([place]) 2017-2019   cured  . Osteopenia    MILD  . Urine retention   . Uterine cancer ([facility] [place])    at age 32   Past Surgical History:  Procedure Laterality Date  . ABDOMINAL HYSTERECTOMY     AGE 40  . APPENDECTOMY     AGE 70  . BLADDER SURGERY    . Catarct Surgery    . ECTOPIC PREGNANCY SURGERY     age 49  . LAPAROSCOPIC SMALL BOWEL RESECTION    . NM PET DX LYMPHOMA  10/2018   IN REMISSION  . occular pressure     . OTHER SURGICAL HISTORY    . REPLACEMENT TOTAL KNEE BILATERAL    . TONSILLECTOMY AND ADENOIDECTOMY     age 37 or 15  . UTERINE CANCER SURGERY     age 37- located in vagina    No Known Allergies  Outpatient Encounter Medications as of 12/04/2020  Medication Sig  . Acetaminophen 500 MG capsule Take 500 mg by mouth 3 (three) times daily. 2 tabs  Three times daily  . bimatoprost (LUMIGAN) 0.01 % SOLN Place 1 drop into both eyes at bedtime.  . brimonidine (ALPHAGAN) 0.15 % ophthalmic solution Place 1 drop into both eyes 3 (three) times daily.  . cetirizine (ZYRTEC) 10 MG tablet Take 10 mg by mouth at bedtime.   . Cholecalciferol (VITAMIN D3) 50 MCG (2000 UT) CHEW Chew 1 capsule by mouth daily.  . eszopiclone (LUNESTA) 2 MG TABS tablet Take 1 tablet (2 mg total) by mouth at bedtime. Take immediately before bedtime  . Lidocaine 4 % PTCH Apply topically.  Marland Kitchen losartan (COZAAR) 50 MG tablet TAKE 1 TABLET DAILY  . magnesium hydroxide (MILK OF MAGNESIA) 400 MG/5ML suspension Take by mouth daily as needed for mild constipation.  . methocarbamol (ROBAXIN) 500 MG tablet Take 1 tablet (500 mg total) by mouth in the morning and at bedtime.  . methocarbamol (ROBAXIN) 500 MG tablet Take 1 tablet (500 mg total) by mouth every 6 (six) hours as needed for muscle spasms.  . nitrofurantoin (MACRODANTIN) 50 MG capsule Take 1 capsule (50 mg total) by mouth daily.  . ondansetron (ZOFRAN) 4 MG tablet Take 4 mg by mouth every 8 (eight) hours as needed for nausea or vomiting.  . polyethylene glycol (MIRALAX / GLYCOLAX) 17 g packet Take 17 g by mouth daily.  Marland Kitchen Propylene Glycol 0.6 % SOLN Apply 1 drop to eye daily as needed.  . rosuvastatin (CRESTOR) 5 MG tablet TAKE 1 TABLET AT BEDTIME  . sennosides-docusate sodium (SENOKOT-S) 8.6-50 MG tablet Take 1 tablet by mouth daily.  . traMADol (ULTRAM) 50 MG tablet Take 1 tablet (50 mg total) by mouth every 6 (six) hours as needed.  . traMADol (ULTRAM) 50 MG tablet Take 1  tablet (50 mg total) by mouth 2 (two) times daily.   No facility-administered encounter medications on file as of 12/04/2020.    Review of Systems  Constitutional: Negative for chills and fever.  HENT: Negative for congestion and sore throat.   Respiratory: Negative for shortness of breath.   Cardiovascular: Negative for chest pain, palpitations and leg swelling.  Gastrointestinal: Negative for abdominal pain and constipation.  Genitourinary: Negative for dysuria.  Musculoskeletal: Positive for back pain. Negative for falls and joint pain.  Skin: Negative for itching and rash.  Neurological: Negative for dizziness  and loss of consciousness.  Psychiatric/Behavioral: Negative for depression and memory loss. The patient has insomnia. The patient is not nervous/anxious.     Immunization History  Administered Date(s) Administered  . Influenza, High Dose Seasonal PF 09/26/2014, 08/17/2015, 08/19/2016, 08/20/2017, 08/24/2018, 09/23/2019, 10/12/2020  . Moderna Sars-Covid-2 Vaccination 12/27/2019, 01/24/2020  . Pneumococcal Conjugate-13 09/07/2014  . Pneumococcal Polysaccharide-23 09/21/2007  . Tdap 05/23/2010  . Zoster 10/24/2005  . Zoster Recombinat (Shingrix) 09/22/2017, 11/25/2017   Pertinent  Health Maintenance Due  Topic Date Due  . INFLUENZA VACCINE  Completed  . DEXA SCAN  Completed  . PNA vac Low Risk Adult  Discontinued   Fall Risk  10/03/2020 07/25/2020 01/11/2020 12/15/2019 10/19/2019  Falls in the past year? 1 0 0 0 0  Number falls in past yr: 0 0 0 0 0  Injury with Fall? 1 0 0 0 0  Risk for fall due to : - - - - -  Follow up - - - - -   Functional Status Survey:    Vitals:   12/04/20 1022  BP: 125/72  Pulse: 69  Temp: (!) 96.8 F (36 C)  Weight: 173 lb 12.8 oz (78.8 kg)  Height: 5\' 4"  (1.626 m)   Body mass index is 29.83 kg/m. Physical Exam Vitals reviewed.  Constitutional:      General: She is not in acute distress.    Appearance: Normal appearance. She  is not toxic-appearing.  HENT:     Head: Normocephalic and atraumatic.     Ears:     Comments: Hearing aids Eyes:     Comments: glasses  Cardiovascular:     Rate and Rhythm: Normal rate and regular rhythm.  Pulmonary:     Effort: Pulmonary effort is normal.     Breath sounds: Normal breath sounds.  Abdominal:     General: Bowel sounds are normal. There is no distension.     Palpations: Abdomen is soft.     Tenderness: There is no abdominal tenderness.  Musculoskeletal:     Comments: Mild tenderness right lower back/ribs/flank  Neurological:     General: No focal deficit present.     Mental Status: She is alert and oriented to person, place, and time.  Psychiatric:     Comments: Irritable and anxious about medication reductions and idea of returning home     Labs reviewed: Recent Labs    01/03/20 0500  NA 142  K 4.3  CL 106  CO2 28*  BUN 32*  CREATININE 0.5  CALCIUM 9.6   Recent Labs    01/03/20 0500  ALBUMIN 4.5   Recent Labs    01/03/20 0500  WBC 4.6  HGB 13.2  HCT 40  PLT 176   No results found for: TSH No results found for: HGBA1C Lab Results  Component Value Date   CHOL 164 01/03/2020   HDL 64 01/03/2020   LDLCALC 76 01/03/2020   TRIG 116 01/03/2020   CHOLHDL 3 08/24/2018    Significant Diagnostic Results in last 30 days:  MR Lumbar Spine w/o contrast  Result Date: 11/15/2020 CLINICAL DATA:  L1 compression fracture.  Back pain. EXAM: MRI LUMBAR SPINE WITHOUT CONTRAST TECHNIQUE: Multiplanar, multisequence MR imaging of the lumbar spine was performed. No intravenous contrast was administered. COMPARISON:  Radiographs 11/13/2020 and MRI from 10/21/2018 FINDINGS: Segmentation: Is The lowest lumbar type non-rib-bearing vertebra is labeled as L5. Alignment: 3 mm degenerative anterolisthesis at L4-5 at L5-S1. And 3 mm degenerative retrolisthesis Vertebrae: 55% compression fracture  at L1 with 7 mm bony retropulsion as well as low T1 signal intensity  compatible with sclerosis. Fluid signal intensity along the superior endplate indicating fracture margin. Edema extends slightly into the bases of the pedicles. Type 2 degenerative endplate findings at Z6-1, L3-4, L4-5, and L5-S1 with associated disc desiccation. There is new abnormal low T1 signal medially in the left twelfth rib on image 5 of series 100, raising suspicion for a left medial twelfth rib fracture. Degenerative facet edema bilaterally at L4-5. Conus medullaris and cauda equina: Conus extends to the L1 level. Conus and cauda equina appear normal. Paraspinal and other soft tissues: Mild edema in the proximal left psoas muscle associated with the L1 fracture. Disc levels: T11-12: No impingement.  Right eccentric disc bulge. T12-L1: Moderate central narrowing of the thecal sac due to posterior bony retropulsion from the L1 compression fracture. L1-2: Borderline left foraminal stenosis due to facet spurring. L2-3: Mild left foraminal stenosis and borderline left subarticular lateral recess stenosis due to degenerative facet arthropathy, intervertebral spurring, and disc bulge. This is similar to the prior exam. L3-4: Mild right and borderline left foraminal stenosis along with mild right and borderline left subarticular lateral recess stenosis due to disc bulge, intervertebral spurring, and facet spurring. Appearance similar to prior. L4-5: Mild left and borderline right foraminal stenosis with borderline bilateral subarticular lateral recess stenosis due to disc uncovering and facet arthropathy. Appearance similar to prior. L5-S1: Mild to moderate bilateral foraminal stenosis due to intervertebral spurring, disc bulge, and facet arthropathy, similar to the prior exam. IMPRESSION: 1. Acute 55% compression fracture at L1 with 7 mm bony retropulsion causing moderate central narrowing of the thecal sac at this level. 2. New abnormal low T1 signal medially in the left twelfth rib, raising suspicion for a left  medial twelfth rib fracture. 3. Lumbar spondylosis and degenerative disc disease, causing mild to moderate impingement at L5-S1, and mild impingement at L2-3, L3-4, and L4-5, similar to the prior exam at these levels. Electronically Signed   By: Van Clines M.D.   On: 11/15/2020 12:04   XR Lumbar Spine 2-3 Views  Result Date: 11/19/2020 AP lateral lumbar spine x-rays are obtained and reviewed.  This shows degenerative anterolisthesis grade 1.5 at L4-5.  There is L1 compression fracture with 55% compression.  Comparison to 10/16/2020 images shows progression from 50% to 55% Impression: L1 compression fracture.  Progression as above.   Assessment/Plan 1. Closed compression fracture of body of L1 vertebra (HCC) -gradual progress -wants to try a trial of more medication to see if she can function fully and feel better -tramadol 75mg  po q6h routinely for 7 days -robaxin 500mg  po q6h routinely for 7 days -then plan to wean   2. Slow transit constipation -schedule 2 senokot-s daily in addition to her miralax   3. Nausea -getting zofran in the am before first pain meds but counseled she needs to eat to prevent this  Family/ staff Communication: d/w rehab nurses day and evenings  Labs/tests ordered: no new  Dontasia Miranda L. Tovah Slavick, D.O. [facility] [address], [place] [address] Cell Phone (Mon-Fri 8am-5pm):  [phone] On Call:  [phone] & follow prompts after 5pm & weekends Office Phone:  [phone] Office Fax:  [phone]

## 2020-12-11 ENCOUNTER — Encounter: Payer: Self-pay | Admitting: Orthopaedic Surgery

## 2020-12-11 ENCOUNTER — Non-Acute Institutional Stay (SKILLED_NURSING_FACILITY): Payer: Medicare Other | Admitting: Internal Medicine

## 2020-12-11 ENCOUNTER — Encounter: Payer: Self-pay | Admitting: Internal Medicine

## 2020-12-11 ENCOUNTER — Ambulatory Visit: Payer: Self-pay

## 2020-12-11 ENCOUNTER — Ambulatory Visit (INDEPENDENT_AMBULATORY_CARE_PROVIDER_SITE_OTHER): Payer: Medicare Other | Admitting: Orthopaedic Surgery

## 2020-12-11 VITALS — BP 95/63 | HR 90 | Ht 64.0 in | Wt 173.0 lb

## 2020-12-11 DIAGNOSIS — S32010A Wedge compression fracture of first lumbar vertebra, initial encounter for closed fracture: Secondary | ICD-10-CM

## 2020-12-11 MED ORDER — METHOCARBAMOL 500 MG PO TABS: 500.0000 mg | ORAL_TABLET | Freq: Two times a day (BID) | ORAL | 0 refills | Status: DC | PRN

## 2020-12-11 MED ORDER — TRAMADOL HCL 50 MG PO TABS: ORAL_TABLET | ORAL | 0 refills | Status: AC

## 2020-12-11 NOTE — Progress Notes (Signed)
Location:  [facility] Room Number: 159 Place of Service:  SNF (31) Provider:  Keiosha Cancro L. Mariea Clonts, D.O., C.M.D.  Gayland Curry, DO  Patient Care Team: Gayland Curry, DO as PCP - General (Geriatric Medicine) Rolm Bookbinder, MD as Consulting Physician (Dermatology) Marybelle Killings, MD as Consulting Physician (Orthopedic Surgery) Jola Schmidt, MD as Consulting Physician (Ophthalmology) Milus Banister, MD as Attending Physician (Gastroenterology) Regal, Tamala Fothergill, DPM as Consulting Physician (Podiatry) Lucas Mallow, MD as Consulting Physician (Urology) Jodi Marble, MD as Consulting Physician (Otolaryngology)  Extended Emergency Contact Information Primary Emergency Contact: [name] Address: [address]          [place], [place] [address] [name] of [place] Phone: [phone] Relation: Son Secondary Emergency Contact: [name] Address: [address] [place] [address] [name] of [place] Mobile Phone: [phone] Relation: Son   Goals of care: Advanced Directive information Advanced Directives 12/11/2020  Does Patient Have a Medical Advance Directive? Yes  Type of Advance Directive Living will;Healthcare Power of Attorney  Does patient want to make changes to medical advance directive? No - Patient declined  Copy of [place] in Chart? Yes - validated most recent copy scanned in chart (See row information)   Chief Complaint  Patient presents with  . Acute Visit    Pain     HPI:  Pt is a 86 y.o. female seen today for an acute visit for pain mgt and f/u after her visit earlier with Dr. Lorin Mercy.  She was seen with her son, [name], also present and we all agreed to a wean her her tramadol and robaxin especially after Dr. Lorin Mercy told her she was healing.  We have all noted this in rehab with increased level of activity and appearance of more comfort.  She's been able to  walk multiple times around the unit.     Past Medical History:  Diagnosis Date  . Allergy   . Anxiety   . Arthritis   . Bursitis    right leg  . Family history of kidney cancer   . Family history of lung cancer   . Family history of uterine cancer   . GERD (gastroesophageal reflux disease)   . History of blood transfusion   . History of uterine cancer 01/19/2019  . Hyperlipidemia   . Hypertension   . Insomnia   . Non Hodgkin's lymphoma ([facility]) 2017-2019   cured  . Osteopenia    MILD  . Urine retention   . Uterine cancer ([facility])    at age 49   Past Surgical History:  Procedure Laterality Date  . ABDOMINAL HYSTERECTOMY     AGE 47  . APPENDECTOMY     AGE 52  . BLADDER SURGERY    . Catarct Surgery    . ECTOPIC PREGNANCY SURGERY     age 77  . LAPAROSCOPIC SMALL BOWEL RESECTION    . NM PET DX LYMPHOMA  10/2018   IN REMISSION  . occular pressure    . OTHER SURGICAL HISTORY    . REPLACEMENT TOTAL KNEE BILATERAL    . TONSILLECTOMY AND ADENOIDECTOMY     age 62 or 44  . UTERINE CANCER SURGERY     age 77- located in vagina    No Known Allergies  Outpatient Encounter Medications as of 12/11/2020  Medication Sig  . Acetaminophen 500 MG capsule Take 500 mg  by mouth 3 (three) times daily. 2 tabs  Three times daily  . bimatoprost (LUMIGAN) 0.01 % SOLN Place 1 drop into both eyes at bedtime.  . brimonidine (ALPHAGAN) 0.15 % ophthalmic solution Place 1 drop into both eyes 3 (three) times daily.  . cetirizine (ZYRTEC) 10 MG tablet Take 10 mg by mouth at bedtime.   . Cholecalciferol (VITAMIN D3) 50 MCG (2000 UT) CHEW Chew 1 capsule by mouth daily.  . eszopiclone (LUNESTA) 2 MG TABS tablet Take 1 tablet (2 mg total) by mouth at bedtime. Take immediately before bedtime  . Lidocaine 4 % PTCH Apply topically.  Marland Kitchen losartan (COZAAR) 50 MG tablet TAKE 1 TABLET DAILY  . magnesium hydroxide (MILK OF MAGNESIA) 400 MG/5ML suspension Take by mouth daily as needed for mild constipation.  .  nitrofurantoin (MACRODANTIN) 50 MG capsule Take 1 capsule (50 mg total) by mouth daily.  . ondansetron (ZOFRAN) 4 MG tablet Take 4 mg by mouth every 8 (eight) hours as needed for nausea or vomiting.  . polyethylene glycol (MIRALAX / GLYCOLAX) 17 g packet Take 17 g by mouth daily.  Marland Kitchen Propylene Glycol 0.6 % SOLN Apply 1 drop to eye daily as needed.  . rosuvastatin (CRESTOR) 5 MG tablet TAKE 1 TABLET AT BEDTIME  . sennosides-docusate sodium (SENOKOT-S) 8.6-50 MG tablet Take 2 tablets by mouth daily.  . [DISCONTINUED] methocarbamol (ROBAXIN) 500 MG tablet Take 1 tablet (500 mg total) by mouth in the morning and at bedtime.  . [DISCONTINUED] methocarbamol (ROBAXIN) 500 MG tablet Take 1 tablet (500 mg total) by mouth every 6 (six) hours as needed for muscle spasms.  . [DISCONTINUED] traMADol (ULTRAM) 50 MG tablet Take 1 tablet (50 mg total) by mouth every 6 (six) hours as needed.  . [DISCONTINUED] traMADol (ULTRAM) 50 MG tablet Take 1 tablet (50 mg total) by mouth 2 (two) times daily.  . methocarbamol (ROBAXIN) 500 MG tablet Take 1 tablet (500 mg total) by mouth 2 (two) times daily as needed for muscle spasms (for one week).  . traMADol (ULTRAM) 50 MG tablet Take 1 tablet (50 mg total) by mouth every 6 (six) hours for 3 days, THEN 0.5 tablets (25 mg total) every 6 (six) hours for 4 days.   No facility-administered encounter medications on file as of 12/11/2020.    Review of Systems  Constitutional: Negative for chills and fever.  HENT: Positive for hearing loss.   Eyes: Negative for blurred vision.  Respiratory: Negative for cough and shortness of breath.   Cardiovascular: Negative for chest pain and leg swelling.  Gastrointestinal: Negative for abdominal pain and constipation.  Genitourinary: Negative for dysuria.  Musculoskeletal: Positive for back pain and myalgias. Negative for falls (no additional) and joint pain.  Skin: Negative for rash.  Neurological: Negative for dizziness, loss of  consciousness and weakness.  Endo/Heme/Allergies: Negative for environmental allergies.  Psychiatric/Behavioral: Negative for depression and memory loss. The patient is not nervous/anxious and does not have insomnia.        Sleeping well with pain regimen now    Immunization History  Administered Date(s) Administered  . Influenza, High Dose Seasonal PF 09/26/2014, 08/17/2015, 08/19/2016, 08/20/2017, 08/24/2018, 09/23/2019, 10/05/2020  . Moderna Sars-Covid-2 Vaccination 12/27/2019, 01/24/2020  . Pneumococcal Conjugate-13 09/07/2014  . Pneumococcal Polysaccharide-23 09/21/2007  . Tdap 05/23/2010  . Zoster 10/24/2005  . Zoster Recombinat (Shingrix) 09/22/2017, 11/25/2017   Pertinent  Health Maintenance Due  Topic Date Due  . INFLUENZA VACCINE  Completed  . DEXA SCAN  Completed  .  PNA vac Low Risk Adult  Discontinued   Fall Risk  10/03/2020 07/25/2020 01/11/2020 12/15/2019 10/19/2019  Falls in the past year? 1 0 0 0 0  Number falls in past yr: 0 0 0 0 0  Injury with Fall? 1 0 0 0 0  Risk for fall due to : - - - - -  Follow up - - - - -   Functional Status Survey:    Vitals:   12/11/20 1424  BP: 135/80  Pulse: 73  Temp: 97.7 F (36.5 C)  SpO2: 94%  Weight: 173 lb 12.8 oz (78.8 kg)  Height: 5\' 4"  (1.626 m)   Body mass index is 29.83 kg/m. Physical Exam Vitals reviewed.  Constitutional:      Appearance: Normal appearance. She is not toxic-appearing.  HENT:     Head: Normocephalic and atraumatic.     Ears:     Comments: Hearing aids Eyes:     Comments: glasses  Cardiovascular:     Rate and Rhythm: Normal rate and regular rhythm.  Pulmonary:     Effort: Pulmonary effort is normal.     Breath sounds: Normal breath sounds. No wheezing, rhonchi or rales.  Abdominal:     General: Bowel sounds are normal. There is no distension.  Musculoskeletal:     Right lower leg: No edema.     Left lower leg: No edema.     Comments: No significant tenderness of lower thoracic or  lumbar region now  Skin:    General: Skin is warm and dry.  Neurological:     General: No focal deficit present.     Mental Status: She is alert and oriented to person, place, and time.     Comments: Using walker  Psychiatric:        Mood and Affect: Mood normal.        Behavior: Behavior normal.     Labs reviewed: Recent Labs    01/03/20 0500  NA 142  K 4.3  CL 106  CO2 28*  BUN 32*  CREATININE 0.5  CALCIUM 9.6   Recent Labs    01/03/20 0500  ALBUMIN 4.5   Recent Labs    01/03/20 0500  WBC 4.6  HGB 13.2  HCT 40  PLT 176   No results found for: TSH No results found for: HGBA1C Lab Results  Component Value Date   CHOL 164 01/03/2020   HDL 64 01/03/2020   LDLCALC 76 01/03/2020   TRIG 116 01/03/2020   CHOLHDL 3 08/24/2018    Significant Diagnostic Results in last 30 days:  XR Lumbar Spine 2-3 Views  Result Date: 12/11/2020 AP lateral lumbar spine x-rays obtained reviewed this shows unchanged few millimeters anterolisthesis at L4-5.  50% L1 compression fracture unchanged from previous imaging last month. Impression: L1 compression fracture 50% compression and stable.   Assessment/Plan 1. Closed compression fracture of body of L1 vertebra (HCC) - methocarbamol (ROBAXIN) 500 MG tablet; Take 1 tablet (500 mg total) by mouth 2 (two) times daily as needed for muscle spasms (for one week).  Dispense: 14 tablet; Refill: 0 - traMADol (ULTRAM) 50 MG tablet; Take 1 tablet (50 mg total) by mouth every 6 (six) hours for 3 days, THEN 0.5 tablets (25 mg total) every 6 (six) hours for 4 days.  Dispense: 20 tablet; Refill: 0 Decrease tramadol to 50mg  po q6h x 3 days, then 25mg  po q6h x 4 days, then reassess Tuesday Decrease robaxin to 500mg  po bid prn muscle  spasms x 1 week  Family/ staff Communication: d/w son, [name], and rehab nurse  Labs/tests ordered:  No new  25 mins spent with pt and her son and nursing tweaking her pain regimen and counseling her about  it  Stefen Juba L. Henley Blyth, D.O. [facility] [address], [place] [address] Cell Phone (Mon-Fri 8am-5pm):  [phone] On Call:  [phone] & follow prompts after 5pm & weekends Office Phone:  [phone] Office Fax:  [phone]

## 2020-12-11 NOTE — Progress Notes (Signed)
Office Visit Note   Patient: Linda [name]           Date of Birth: 1932/06/11           MRN: [id number] Visit Date: 12/11/2020              Requested by: Kermit Balo, DO [address] [place],  [place] [address] PCP: Kermit Balo, DO   Assessment & Plan: Visit Diagnoses:  1. Compression fracture of L1 vertebra, initial encounter (HCC)     Plan: I discussed with the patient the best exercises to do some walking when she gets tired sit and rest.  She can gradually increase her walking distance and I discussed with her that every week her pain should improve.  I plan to recheck her in 39month.  We discussed gradually weaning down on the Ultram as her pain improves.  She is very concerned that she would not go back to the level of pain that she had immediately after she had the compression fracture.  We reviewed x-rays I gave her copies and discussed that her fracture has not progressed and is slowly healing in a normal fashion.  She is very anxious to get back to independent living at [facility] as she was before her fall.  She needs to use her walker whenever she gets up to ambulate.  Once her fracture is completely healed and I discussed therapy is more likely to benefit her.  Follow-Up Instructions: No follow-ups on file.   Orders:  Orders Placed This Encounter  Procedures  . XR Lumbar Spine 2-3 Views   No orders of the defined types were placed in this encounter.     Procedures: No procedures performed   Clinical Data: No additional findings.   Subjective: Chief Complaint  Patient presents with  . Lower Back - Fracture, Follow-up    HPI 86 year old female in skilled rehab section of [facility] for L1 compression fracture that occurred after a mid-October fall.  She recently after Ultram to 1 p.o. every 6 hours and states she is gotten about 25% reduction in the pain.  X-rays today shows no further compression and I discussed with her this is likely improvement  related to gradual progressive healing of the fracture which is as expected.  OT and PT seem to be making some of her symptoms worse with exercises she was doing.  She is now independently able to ambulate with a walker on her own but gets tired.  Patient is here again with her son.  Review of Systems updated unchanged.   Objective: Vital Signs: BP 95/63   Pulse 90   Ht 5\' 4"  (1.626 m)   Wt 173 lb (78.5 kg)   BMI 29.70 kg/m   Physical Exam Constitutional:      Appearance: She is well-developed.  HENT:     Head: Normocephalic.     Right Ear: External ear normal.     Left Ear: External ear normal.  Eyes:     Pupils: Pupils are equal, round, and reactive to light.  Neck:     Thyroid: No thyromegaly.     Trachea: No tracheal deviation.  Cardiovascular:     Rate and Rhythm: Normal rate.  Pulmonary:     Effort: Pulmonary effort is normal.  Abdominal:     Palpations: Abdomen is soft.  Skin:    General: Skin is warm and dry.  Neurological:     Mental Status: She is alert and oriented to  person, place, and time.  Psychiatric:        Mood and Affect: Mood and affect normal.        Behavior: Behavior normal.     Ortho Exam anterior tib ankle dorsiflexion plantarflexion is intact.  Specialty Comments:  No specialty comments available.  Imaging: No results found.   PMFS History: Patient Active Problem List   Diagnosis Date Noted  . Lumbar compression fracture ([place]) 11/13/2020  . Rash 12/15/2019  . Venous insufficiency of both lower extremities 07/15/2019  . Environmental allergies 02/23/2019  . Osteoporosis with pathological fracture 02/23/2019  . Senile purpura ([place]) 02/23/2019  . History of uterine cancer 01/19/2019  . Family history of uterine cancer   . Family history of kidney cancer   . Family history of lung cancer   . Fracture of right acetabulum ([place]) 01/11/2019  . Chronic constipation with overflow incontinence 12/31/2018  . Physical deconditioning  12/31/2018  . Spinal stenosis of lumbar region without neurogenic claudication 12/03/2018  . Lumbar foraminal stenosis 12/03/2018  . 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 Seasonal allergies 10/20/2018  . Trochanteric bursitis, right hip 09/14/2018  . Chronic bilateral low back pain 09/14/2018  . Hoarseness of voice 04/08/2018  . Hypertension, essential, benign 01/16/2018  . Sensorineural hearing loss (SNHL) of both ears 01/16/2018  . Insomnia 01/16/2018  . H/O Cancer of vaginal vault ([place]) 01/15/2018  . Osteopenia of both hips 01/15/2018  . Hyperlipidemia 01/15/2018  . DJD (degenerative joint disease) of knee 02/13/2011  . H/O difficult intubation 02/12/2011  . Dry eye syndrome 04/10/2010  . Incomplete emptying of bladder 08/31/2009  . Borderline glaucoma with ocular hypertension 09/07/2008   Past Medical History:  Diagnosis Date  . Allergy   . Anxiety   . Arthritis   . Bursitis    right leg  . Family history of kidney cancer   . Family history of lung cancer   . Family history of uterine cancer   . GERD (gastroesophageal reflux disease)   . History of blood transfusion   . History of uterine cancer 01/19/2019  . Hyperlipidemia   . Hypertension   . Insomnia   . Non Hodgkin's lymphoma ([place]) 2017-2019   cured  . Osteopenia    MILD  . Urine retention   . Uterine cancer ([place])    at age 6    Family History  Problem Relation Age of Onset  . Uterine cancer Sister 80       uterine cancer  . COPD Maternal Grandfather   . Lung cancer Maternal Grandfather   . Heart failure Mother 63  . Pneumonia Father 55  . Dementia Father   . Uterine cancer Maternal Grandmother 62  . Other Sister        Guillain Barre syndrom  . Kidney cancer Son 75  . Dementia Paternal Grandmother   . Lung cancer Maternal Uncle   . Other Son        Familial Hypercholesterolemia  . Colon cancer Neg Hx   . Esophageal cancer Neg Hx   . Liver cancer Neg Hx   . Pancreatic cancer Neg Hx   . Rectal  cancer Neg Hx   . Stomach cancer Neg Hx     Past Surgical History:  Procedure Laterality Date  . ABDOMINAL HYSTERECTOMY     AGE 66  . APPENDECTOMY     AGE 57  . BLADDER SURGERY    . Catarct Surgery    . ECTOPIC PREGNANCY SURGERY  age 59  . LAPAROSCOPIC SMALL BOWEL RESECTION    . NM PET DX LYMPHOMA  10/2018   IN REMISSION  . occular pressure    . OTHER SURGICAL HISTORY    . REPLACEMENT TOTAL KNEE BILATERAL    . TONSILLECTOMY AND ADENOIDECTOMY     age 48 or 61  . UTERINE CANCER SURGERY     age 89- located in vagina   Social History   Occupational History  . Occupation: retired  Tobacco Use  . Smoking status: Never Smoker  . Smokeless tobacco: Never Used  Vaping Use  . Vaping Use: Never used  Substance and Sexual Activity  . Alcohol use: Yes    Alcohol/week: 7.0 standard drinks    Types: 7 Glasses of wine per week    Comment: one glass of wine with dinner  . Drug use: No  . Sexual activity: Never

## 2020-12-18 ENCOUNTER — Encounter: Payer: Self-pay | Admitting: Internal Medicine

## 2020-12-18 ENCOUNTER — Non-Acute Institutional Stay (SKILLED_NURSING_FACILITY): Payer: Medicare Other | Admitting: Internal Medicine

## 2020-12-18 DIAGNOSIS — K5901 Slow transit constipation: Secondary | ICD-10-CM

## 2020-12-18 DIAGNOSIS — R110 Nausea: Secondary | ICD-10-CM | POA: Diagnosis not present

## 2020-12-18 DIAGNOSIS — S32010A Wedge compression fracture of first lumbar vertebra, initial encounter for closed fracture: Secondary | ICD-10-CM | POA: Diagnosis not present

## 2020-12-18 NOTE — Progress Notes (Signed)
Location:  [facility] Number: 159 Place of Service:  SNF (31) Provider:  Jiro Kiester L. Mariea Clonts, D.O., C.M.D.  Gayland Curry, DO  Patient Care Team: Gayland Curry, DO as PCP - General (Geriatric Medicine) Rolm Bookbinder, MD as Consulting Physician (Dermatology) Marybelle Killings, MD as Consulting Physician (Orthopedic Surgery) Jola Schmidt, MD as Consulting Physician (Ophthalmology) Milus Banister, MD as Attending Physician (Gastroenterology) Regal, Tamala Fothergill, DPM as Consulting Physician (Podiatry) Lucas Mallow, MD as Consulting Physician (Urology) Jodi Marble, MD as Consulting Physician (Otolaryngology)  Extended Emergency Contact Information Primary Emergency Contact: [name] Address: [address]          [place], [place] [address] [name] of [place] Phone: [phone] Relation: Son Secondary Emergency Contact: [name] Address: [address] [place] [address] [name] of [place] Mobile Phone: [phone] Relation: Son  Goals of care: Advanced Directive information Advanced Directives 12/19/2020  Does Patient Have a Medical Advance Directive? -  Type of Advance Directive Living will;Healthcare Power of Attorney  Does patient want to make changes to medical advance directive? No - Patient declined  Copy of [place] in Chart? Yes - validated most recent copy scanned in chart (See row information)     Chief Complaint  Patient presents with  . Acute Visit    Follow up on pain     HPI:  Pt is a 87 y.o. female seen today for an acute visit for f/u on pain from her compression fx.  She has been weaned from her tramadol and robaxin.  She was to get her last tramadol that I'd ordered this evening, but said she had no pain so elected not to take it.  She was lying in bed and felt cold to touch and said she thought maybe she was starting to have withdrawals from the  medications.  She was a little bit anxious and worried but redirectable.  She was happy about plans to go to respite tomorrow.  Arrangements have been made for her to have the raised toilet seat and her son has already helped her pack her items in her rehab room.  She is currently concerned that I need to renew her [name] which is not true b/c it will come through [place] when [place] administers her meds.  We agreed to a 4 week clinic f/u if all goes well.     Past Medical History:  Diagnosis Date  . Allergy   . Anxiety   . Arthritis   . Bursitis    right leg  . Family history of kidney cancer   . Family history of lung cancer   . Family history of uterine cancer   . GERD (gastroesophageal reflux disease)   . History of blood transfusion   . History of uterine cancer 01/19/2019  . Hyperlipidemia   . Hypertension   . Insomnia   . Non Hodgkin's lymphoma ([place]) 2017-2019   cured  . Osteopenia    MILD  . Urine retention   . Uterine cancer ([place])    at age 3   Past Surgical History:  Procedure Laterality Date  . ABDOMINAL HYSTERECTOMY     AGE 80  . APPENDECTOMY     AGE 81  . BLADDER SURGERY    . Catarct Surgery    . ECTOPIC PREGNANCY SURGERY     age 64  . LAPAROSCOPIC SMALL  BOWEL RESECTION    . NM PET DX LYMPHOMA  10/2018   IN REMISSION  . occular pressure    . OTHER SURGICAL HISTORY    . REPLACEMENT TOTAL KNEE BILATERAL    . TONSILLECTOMY AND ADENOIDECTOMY     age 70 or 61  . UTERINE CANCER SURGERY     age 67- located in vagina    No Known Allergies  Outpatient Encounter Medications as of 12/18/2020  Medication Sig  . Acetaminophen 500 MG capsule Take 500 mg by mouth 3 (three) times daily. 2 tabs  Three times daily  . bimatoprost (LUMIGAN) 0.01 % SOLN Place 1 drop into both eyes at bedtime.  . brimonidine (ALPHAGAN) 0.15 % ophthalmic solution Place 1 drop into both eyes 3 (three) times daily.  . cetirizine (ZYRTEC) 10 MG tablet Take 10 mg by mouth at bedtime.   .  Cholecalciferol (VITAMIN D3) 50 MCG (2000 UT) CHEW Chew 1 capsule by mouth daily.  Marland Kitchen losartan (COZAAR) 50 MG tablet TAKE 1 TABLET DAILY  . magnesium hydroxide (MILK OF MAGNESIA) 400 MG/5ML suspension Take by mouth daily as needed for mild constipation.  . nitrofurantoin (MACRODANTIN) 50 MG capsule Take 1 capsule (50 mg total) by mouth daily.  . ondansetron (ZOFRAN) 4 MG tablet Take 4 mg by mouth every 8 (eight) hours as needed for nausea or vomiting.  . polyethylene glycol (MIRALAX / GLYCOLAX) 17 g packet Take 17 g by mouth daily.  Marland Kitchen Propylene Glycol 0.6 % SOLN Apply 1 drop to eye daily as needed.  . rosuvastatin (CRESTOR) 5 MG tablet TAKE 1 TABLET AT BEDTIME  . sennosides-docusate sodium (SENOKOT-S) 8.6-50 MG tablet Take 2 tablets by mouth daily.  . [DISCONTINUED] eszopiclone (LUNESTA) 2 MG TABS tablet Take 1 tablet (2 mg total) by mouth at bedtime. Take immediately before bedtime  . Lidocaine 4 % PTCH Apply topically.  . [EXPIRED] traMADol (ULTRAM) 50 MG tablet Take 1 tablet (50 mg total) by mouth every 6 (six) hours for 3 days, THEN 0.5 tablets (25 mg total) every 6 (six) hours for 4 days.  . [DISCONTINUED] methocarbamol (ROBAXIN) 500 MG tablet Take 1 tablet (500 mg total) by mouth 2 (two) times daily as needed for muscle spasms (for one week).   No facility-administered encounter medications on file as of 12/18/2020.    Review of Systems  Constitutional: Positive for chills. Negative for fever, malaise/fatigue and weight loss.  HENT: Positive for hearing loss. Negative for congestion and sore throat.   Eyes: Negative for blurred vision.  Respiratory: Negative for cough and shortness of breath.   Cardiovascular: Negative for chest pain, palpitations and leg swelling.  Gastrointestinal: Positive for nausea. Negative for abdominal pain, constipation and vomiting.  Genitourinary: Negative for dysuria.  Musculoskeletal: Negative for back pain, falls and joint pain.  Skin: Negative for itching  and rash.  Neurological: Negative for dizziness and loss of consciousness.  Endo/Heme/Allergies: Bruises/bleeds easily.  Psychiatric/Behavioral: Negative for depression and memory loss. The patient has insomnia. The patient is not nervous/anxious.     Immunization History  Administered Date(s) Administered  . Influenza, High Dose Seasonal PF 09/26/2014, 08/17/2015, 08/19/2016, 08/20/2017, 08/24/2018, 09/23/2019, 10/05/2020  . Moderna Sars-Covid-2 Vaccination 12/27/2019, 01/24/2020  . Pneumococcal Conjugate-13 09/07/2014  . Pneumococcal Polysaccharide-23 09/21/2007  . Tdap 05/23/2010  . Zoster 10/24/2005  . Zoster Recombinat (Shingrix) 09/22/2017, 11/25/2017   Pertinent  Health Maintenance Due  Topic Date Due  . INFLUENZA VACCINE  Completed  . DEXA SCAN  Completed  .  PNA vac Low Risk Adult  Discontinued   Fall Risk  10/03/2020 07/25/2020 01/11/2020 12/15/2019 10/19/2019  Falls in the past year? 1 0 0 0 0  Number falls in past yr: 0 0 0 0 0  Injury with Fall? 1 0 0 0 0  Risk for fall due to : - - - - -  Follow up - - - - -   Functional Status Survey:    Vitals:   12/18/20 1647  BP: 118/80  Pulse: 95  Temp: (!) 97.5 F (36.4 C)  SpO2: 98%  Weight: 171 lb (77.6 kg)  Height: 5\' 4"  (1.626 m)   Body mass index is 29.35 kg/m. Physical Exam Vitals reviewed.  Constitutional:      General: She is not in acute distress.    Appearance: Normal appearance. She is not ill-appearing.  HENT:     Head: Normocephalic and atraumatic.  Cardiovascular:     Rate and Rhythm: Normal rate and regular rhythm.     Pulses: Normal pulses.     Heart sounds: Normal heart sounds.  Pulmonary:     Effort: Pulmonary effort is normal.     Breath sounds: Normal breath sounds. No rales.  Musculoskeletal:        General: No tenderness. Normal range of motion.     Right lower leg: No edema.     Left lower leg: No edema.  Skin:    Comments: Cold skin  Neurological:     General: No focal deficit  present.     Mental Status: She is alert and oriented to person, place, and time.     Labs reviewed: Recent Labs    01/03/20 0500  NA 142  K 4.3  CL 106  CO2 28*  BUN 32*  CREATININE 0.5  CALCIUM 9.6   Recent Labs    01/03/20 0500  ALBUMIN 4.5   Recent Labs    01/03/20 0500  WBC 4.6  HGB 13.2  HCT 40  PLT 176   No results found for: TSH No results found for: HGBA1C Lab Results  Component Value Date   CHOL 164 01/03/2020   HDL 64 01/03/2020   LDLCALC 76 01/03/2020   TRIG 116 01/03/2020   CHOLHDL 3 08/24/2018    Significant Diagnostic Results in last 30 days:  XR Lumbar Spine 2-3 Views  Result Date: 12/11/2020 AP lateral lumbar spine x-rays obtained reviewed this shows unchanged few millimeters anterolisthesis at L4-5.  50% L1 compression fracture unchanged from previous imaging last month. Impression: L1 compression fracture 50% compression and stable.   Assessment/Plan 1. Closed compression fracture of body of L1 vertebra (HCC) -healing well -able now to move to respite off pain meds  2. Slow transit constipation -cont current bowel regimen  3. Nausea -suspect related to pain meds -doubt withdrawal given weaning process, but monitor and see if gets better; if not, needs GI workup done  Family/ staff Communication: d/w snf nurse  Labs/tests ordered:  No new  Bralyn Espino L. Suki Crockett, D.O. Geriatrics 12/13/2020 [facility] [address], [place] Cell Phone (Mon-Fri 8am-5pm):  [phone] On Call:  [phone] & follow prompts after 5pm & weekends Office Phone:  [phone] Office Fax:  [phone]

## 2020-12-18 NOTE — Progress Notes (Signed)
error 

## 2020-12-20 ENCOUNTER — Telehealth: Payer: Self-pay | Admitting: *Deleted

## 2020-12-20 DIAGNOSIS — F419 Anxiety disorder, unspecified: Secondary | ICD-10-CM

## 2020-12-20 MED ORDER — LORAZEPAM 0.5 MG PO TABS: 0.5000 mg | ORAL_TABLET | Freq: Every evening | ORAL | 0 refills | Status: DC

## 2020-12-20 NOTE — Telephone Encounter (Signed)
Son, Daphine Deutscher, [phone], called and stated that patient has been discharged from [facility] and was taken off of her pain medication after 10 weeks of being on it.  Stated patient is complaining that she is having confusion and thinks it is coming from Withdrawals from pain medication.   Son is requesting to speak with Dr. Renato Gails. Please call Daphine Deutscher at #[phone]

## 2020-12-20 NOTE — Telephone Encounter (Signed)
Heard back from [place].  Concern is that his mother has had possible withdrawals from tramadol--she did very well transitioning to AL respite from rehab.  She walked with him the length of the AL hall to the nursing station and back, she then had dinner with him.  Around 6-7pm, she developed shaking chills, nausea, and vomited--it continued through the night.  That had been her first full day off medication.  Today, she's been miserable, weak, somewhat depressed and having dark thoughts.  Sharl Ma notes that his mother has had a "little 11 year old cognitive decline"  Over the past year, but then with rehab back and forth, she's had more anxiety, increased confusion, and missing the big picture of what's going on with her that she previously had been able to think through quite well.  We agreed on this.  He wondered if she'd benefit from short-term anxiolytic to adjust and we agreed to lorazepam 0.5mg  daily in the early evening.  If she has not improved by Monday, we will need to work up if something is amiss GI wise.

## 2020-12-20 NOTE — Telephone Encounter (Signed)
I left him a message over lunch.

## 2020-12-21 ENCOUNTER — Ambulatory Visit: Payer: Medicare Other | Admitting: Orthopaedic Surgery

## 2020-12-24 ENCOUNTER — Non-Acute Institutional Stay (SKILLED_NURSING_FACILITY): Payer: Medicare Other | Admitting: Adult Health

## 2020-12-24 ENCOUNTER — Other Ambulatory Visit: Payer: Self-pay | Admitting: Adult Health

## 2020-12-24 ENCOUNTER — Encounter: Payer: Self-pay | Admitting: Adult Health

## 2020-12-24 DIAGNOSIS — G4700 Insomnia, unspecified: Secondary | ICD-10-CM

## 2020-12-24 DIAGNOSIS — R634 Abnormal weight loss: Secondary | ICD-10-CM | POA: Diagnosis not present

## 2020-12-24 DIAGNOSIS — S32010D Wedge compression fracture of first lumbar vertebra, subsequent encounter for fracture with routine healing: Secondary | ICD-10-CM

## 2020-12-24 DIAGNOSIS — F419 Anxiety disorder, unspecified: Secondary | ICD-10-CM

## 2020-12-24 DIAGNOSIS — R112 Nausea with vomiting, unspecified: Secondary | ICD-10-CM | POA: Diagnosis not present

## 2020-12-24 MED ORDER — ESZOPICLONE 2 MG PO TABS: 2.0000 mg | ORAL_TABLET | Freq: Every day | ORAL | 3 refills | Status: DC

## 2020-12-25 ENCOUNTER — Encounter: Payer: Self-pay | Admitting: Internal Medicine

## 2020-12-25 ENCOUNTER — Other Ambulatory Visit: Payer: Self-pay | Admitting: Internal Medicine

## 2020-12-25 DIAGNOSIS — R309 Painful micturition, unspecified: Secondary | ICD-10-CM | POA: Diagnosis not present

## 2020-12-25 DIAGNOSIS — Z79899 Other long term (current) drug therapy: Secondary | ICD-10-CM | POA: Diagnosis not present

## 2020-12-25 DIAGNOSIS — R634 Abnormal weight loss: Secondary | ICD-10-CM

## 2020-12-25 DIAGNOSIS — I10 Essential (primary) hypertension: Secondary | ICD-10-CM | POA: Diagnosis not present

## 2020-12-25 DIAGNOSIS — Q6689 Other  specified congenital deformities of feet: Secondary | ICD-10-CM | POA: Diagnosis not present

## 2020-12-25 DIAGNOSIS — R1110 Vomiting, unspecified: Secondary | ICD-10-CM

## 2020-12-25 DIAGNOSIS — E785 Hyperlipidemia, unspecified: Secondary | ICD-10-CM | POA: Diagnosis not present

## 2020-12-25 DIAGNOSIS — J189 Pneumonia, unspecified organism: Secondary | ICD-10-CM | POA: Diagnosis not present

## 2020-12-25 DIAGNOSIS — L602 Onychogryphosis: Secondary | ICD-10-CM | POA: Diagnosis not present

## 2020-12-25 LAB — COMPREHENSIVE METABOLIC PANEL
Albumin: 3.3 — AB (ref 3.5–5.0)
Calcium: 8.7 (ref 8.7–10.7)

## 2020-12-25 LAB — HEPATIC FUNCTION PANEL
ALT: 11 (ref 7–35)
AST: 22 (ref 13–35)

## 2020-12-25 LAB — BASIC METABOLIC PANEL
BUN: 14 (ref 4–21)
CO2: 26 — AB (ref 13–22)
Chloride: 103 (ref 99–108)
Creatinine: 0.5 (ref 0.5–1.1)
Glucose: 82
Potassium: 3.7 (ref 3.4–5.3)
Sodium: 141 (ref 137–147)

## 2020-12-25 LAB — CBC AND DIFFERENTIAL
HCT: 37 (ref 36–46)
Hemoglobin: 12.3 (ref 12.0–16.0)
Platelets: 202 (ref 150–399)
WBC: 3.1

## 2020-12-25 LAB — CBC: RBC: 3.98 (ref 3.87–5.11)

## 2020-12-25 NOTE — Progress Notes (Signed)
Location:  [facility] of Service:  SNF (31) Provider:   Cindi Carbon, ANP [facility] [phone]   Gayland Curry, DO  Patient Care Team: Gayland Curry, DO as PCP - General (Geriatric Medicine) Rolm Bookbinder, MD as Consulting Physician (Dermatology) Marybelle Killings, MD as Consulting Physician (Orthopedic Surgery) Jola Schmidt, MD as Consulting Physician (Ophthalmology) Milus Banister, MD as Attending Physician (Gastroenterology) Regal, Tamala Fothergill, DPM as Consulting Physician (Podiatry) Lucas Mallow, MD as Consulting Physician (Urology) Jodi Marble, MD as Consulting Physician (Otolaryngology)  Extended Emergency Contact Information Primary Emergency Contact: [name] Address: [address]          [place], [place] [address] [name] of [place] Phone: [phone] Relation: Son Secondary Emergency Contact: [name] Address: [address] [place] [address] [name] of [place] Mobile Phone: [phone] Relation: Son  Code Status:  Full code  Goals of care: Advanced Directive information Advanced Directives 12/19/2020  Does Patient Have a Medical Advance Directive? -  Type of Advance Directive Living will;Healthcare Power of Attorney  Does patient want to make changes to medical advance directive? No - Patient declined  Copy of [place] in Chart? Yes - validated most recent copy scanned in chart (See row information)     Chief Complaint  Patient presents with  . Acute Visit    Anxiety, weight loss, nausea     HPI:  Pt is a 87 y.o. female seen today for symptoms of anxiety, weight loss, and nausea. She was discharged from skilled rehab to [facility] on 12/19/20 after a prolonged period of illness due to an L1 compression fracture.  She reports that she has not been "well" since October of 2021. She has been treated with back injections and pain meds for her  back which is improving. She is having periods of nausea and vomiting along with periods of constipation and loose bowels. This has been off and on an issue for weeks. She has lost 20 lbs in the pat 3 months. She is not having any abd pain but does have some cramps when she has diarrhea. She had a loose incontinent stool in the middle of our visit. She is also experiencing some anxiety and started taking Ativan 4 days ago. The staff stated to me that she was wanting to talk about starting something long term for anxiety but during our visit she declined wanting to change her regimen. She denies feeling depressed or anxious but says other people think that she is. She feels sleepy during the day but has issues staying asleep at night and takes lunesta. She has no appetite which is unusual for her. She has no fever. Noted hx of MALT lymphoma seen by Dr. Alvy Bimler.  No recurrence noted per apt in epic with Dr. Alvy Bimler (01/11/19).    Past Medical History:  Diagnosis Date  . Allergy   . Anxiety   . Arthritis   . Bursitis    right leg  . Family history of kidney cancer   . Family history of lung cancer   . Family history of uterine cancer   . GERD (gastroesophageal reflux disease)   . History of blood transfusion   . History of uterine cancer 01/19/2019  . Hyperlipidemia   . Hypertension   . Insomnia   . Non Hodgkin's lymphoma ([facility]) 2017-2019   cured  . Osteopenia  MILD  . Urine retention   . Uterine cancer ([facility])    at age 63   Past Surgical History:  Procedure Laterality Date  . ABDOMINAL HYSTERECTOMY     AGE 55  . APPENDECTOMY     AGE 64  . BLADDER SURGERY    . Catarct Surgery    . ECTOPIC PREGNANCY SURGERY     age 70  . LAPAROSCOPIC SMALL BOWEL RESECTION    . NM PET DX LYMPHOMA  10/2018   IN REMISSION  . occular pressure    . OTHER SURGICAL HISTORY    . REPLACEMENT TOTAL KNEE BILATERAL    . TONSILLECTOMY AND ADENOIDECTOMY     age 82 or 61  . UTERINE CANCER SURGERY     age 70-  located in vagina    No Known Allergies  Outpatient Encounter Medications as of 12/24/2020  Medication Sig  . Acetaminophen 500 MG capsule Take 500 mg by mouth 3 (three) times daily. 2 tabs  Three times daily  . bimatoprost (LUMIGAN) 0.01 % SOLN Place 1 drop into both eyes at bedtime.  . brimonidine (ALPHAGAN) 0.15 % ophthalmic solution Place 1 drop into both eyes 3 (three) times daily.  . cetirizine (ZYRTEC) 10 MG tablet Take 10 mg by mouth at bedtime.   . Cholecalciferol (VITAMIN D3) 50 MCG (2000 UT) CHEW Chew 1 capsule by mouth daily.  . eszopiclone (LUNESTA) 2 MG TABS tablet Take 1 tablet (2 mg total) by mouth at bedtime. Take immediately before bedtime  . Lidocaine 4 % PTCH Apply topically.  Marland Kitchen LORazepam (ATIVAN) 0.5 MG tablet Take 1 tablet (0.5 mg total) by mouth every evening. Around suppertime  . losartan (COZAAR) 50 MG tablet TAKE 1 TABLET DAILY  . magnesium hydroxide (MILK OF MAGNESIA) 400 MG/5ML suspension Take by mouth daily as needed for mild constipation.  . nitrofurantoin (MACRODANTIN) 50 MG capsule Take 1 capsule (50 mg total) by mouth daily.  . ondansetron (ZOFRAN) 4 MG tablet Take 4 mg by mouth every 8 (eight) hours as needed for nausea or vomiting.  . polyethylene glycol (MIRALAX / GLYCOLAX) 17 g packet Take 17 g by mouth.  . Propylene Glycol 0.6 % SOLN Apply 1 drop to eye daily as needed.  . rosuvastatin (CRESTOR) 5 MG tablet TAKE 1 TABLET AT BEDTIME  . sennosides-docusate sodium (SENOKOT-S) 8.6-50 MG tablet Take 2 tablets by mouth daily.   No facility-administered encounter medications on file as of 12/24/2020.    Review of Systems  Constitutional: Negative for activity change, appetite change, chills, diaphoresis, fatigue, fever and unexpected weight change.  HENT: Negative for congestion.   Respiratory: Negative for cough, shortness of breath and wheezing.   Cardiovascular: Negative for chest pain, palpitations and leg swelling.  Gastrointestinal: Positive for  constipation, diarrhea, nausea and vomiting. Negative for abdominal distention, abdominal pain, blood in stool and rectal pain.  Genitourinary: Negative for difficulty urinating and dysuria.       Self cath  Musculoskeletal: Positive for gait problem. Negative for arthralgias, back pain (improved), joint swelling and myalgias.  Neurological: Negative for dizziness, tremors, seizures, syncope, facial asymmetry, speech difficulty, weakness, light-headedness, numbness and headaches.  Psychiatric/Behavioral: Negative for agitation, behavioral problems and confusion. The patient is nervous/anxious.     Immunization History  Administered Date(s) Administered  . Influenza, High Dose Seasonal PF 09/26/2014, 08/17/2015, 08/19/2016, 08/20/2017, 08/24/2018, 09/23/2019, 10/05/2020  . Moderna Sars-Covid-2 Vaccination 12/27/2019, 01/24/2020  . Pneumococcal Conjugate-13 09/07/2014  . Pneumococcal Polysaccharide-23 09/21/2007  . Tdap 05/23/2010  .  Zoster 10/24/2005  . Zoster Recombinat (Shingrix) 09/22/2017, 11/25/2017   Pertinent  Health Maintenance Due  Topic Date Due  . INFLUENZA VACCINE  Completed  . DEXA SCAN  Completed  . PNA vac Low Risk Adult  Discontinued   Fall Risk  10/03/2020 07/25/2020 01/11/2020 12/15/2019 10/19/2019  Falls in the past year? 1 0 0 0 0  Number falls in past yr: 0 0 0 0 0  Injury with Fall? 1 0 0 0 0  Risk for fall due to : - - - - -  Follow up - - - - -   Functional Status Survey:    Vitals:   12/25/20 0824  BP: (!) 143/72  Pulse: 80  Resp: 18  Temp: (!) 97.3 F (36.3 C)  SpO2: 95%  Weight: 171 lb (77.6 kg)   Body mass index is 29.35 kg/m. Physical Exam Vitals and nursing note reviewed.  Constitutional:      General: She is not in acute distress.    Appearance: She is not diaphoretic.  HENT:     Head: Normocephalic and atraumatic.     Mouth/Throat:     Mouth: Mucous membranes are moist.     Pharynx: Oropharynx is clear.  Neck:     Vascular: No JVD.   Cardiovascular:     Rate and Rhythm: Normal rate and regular rhythm.     Heart sounds: No murmur heard.   Pulmonary:     Effort: Pulmonary effort is normal. No respiratory distress.     Breath sounds: Normal breath sounds. No wheezing.  Abdominal:     General: Bowel sounds are normal. There is no distension.     Palpations: Abdomen is soft. There is no mass.     Tenderness: There is abdominal tenderness (around the umbilical area ). There is no right CVA tenderness, left CVA tenderness, guarding or rebound.     Hernia: No hernia is present.  Musculoskeletal:     Right lower leg: No edema.     Left lower leg: No edema.  Skin:    General: Skin is warm and dry.  Neurological:     Mental Status: She is alert and oriented to person, place, and time.  Psychiatric:     Comments: flat     Labs reviewed: Recent Labs    01/03/20 0500  NA 142  K 4.3  CL 106  CO2 28*  BUN 32*  CREATININE 0.5  CALCIUM 9.6   Recent Labs    01/03/20 0500  ALBUMIN 4.5   Recent Labs    01/03/20 0500  WBC 4.6  HGB 13.2  HCT 40  PLT 176   No results found for: TSH No results found for: HGBA1C Lab Results  Component Value Date   CHOL 164 01/03/2020   HDL 64 01/03/2020   LDLCALC 76 01/03/2020   TRIG 116 01/03/2020   CHOLHDL 3 08/24/2018    Significant Diagnostic Results in last 30 days:  MR Lumbar Spine w/o contrast  Result Date: 11/15/2020 CLINICAL DATA:  L1 compression fracture.  Back pain. EXAM: MRI LUMBAR SPINE WITHOUT CONTRAST TECHNIQUE: Multiplanar, multisequence MR imaging of the lumbar spine was performed. No intravenous contrast was administered. COMPARISON:  Radiographs 11/13/2020 and MRI from 10/21/2018 FINDINGS: Segmentation: Is The lowest lumbar type non-rib-bearing vertebra is labeled as L5. Alignment: 3 mm degenerative anterolisthesis at L4-5 at L5-S1. And 3 mm degenerative retrolisthesis Vertebrae: 55% compression fracture at L1 with 7 mm bony retropulsion as well as low  T1 signal intensity compatible with sclerosis. Fluid signal intensity along the superior endplate indicating fracture margin. Edema extends slightly into the bases of the pedicles. Type 2 degenerative endplate findings at T2-6, L3-4, L4-5, and L5-S1 with associated disc desiccation. There is new abnormal low T1 signal medially in the left twelfth rib on image 5 of series 100, raising suspicion for a left medial twelfth rib fracture. Degenerative facet edema bilaterally at L4-5. Conus medullaris and cauda equina: Conus extends to the L1 level. Conus and cauda equina appear normal. Paraspinal and other soft tissues: Mild edema in the proximal left psoas muscle associated with the L1 fracture. Disc levels: T11-12: No impingement.  Right eccentric disc bulge. T12-L1: Moderate central narrowing of the thecal sac due to posterior bony retropulsion from the L1 compression fracture. L1-2: Borderline left foraminal stenosis due to facet spurring. L2-3: Mild left foraminal stenosis and borderline left subarticular lateral recess stenosis due to degenerative facet arthropathy, intervertebral spurring, and disc bulge. This is similar to the prior exam. L3-4: Mild right and borderline left foraminal stenosis along with mild right and borderline left subarticular lateral recess stenosis due to disc bulge, intervertebral spurring, and facet spurring. Appearance similar to prior. L4-5: Mild left and borderline right foraminal stenosis with borderline bilateral subarticular lateral recess stenosis due to disc uncovering and facet arthropathy. Appearance similar to prior. L5-S1: Mild to moderate bilateral foraminal stenosis due to intervertebral spurring, disc bulge, and facet arthropathy, similar to the prior exam. IMPRESSION: 1. Acute 55% compression fracture at L1 with 7 mm bony retropulsion causing moderate central narrowing of the thecal sac at this level. 2. New abnormal low T1 signal medially in the left twelfth rib, raising  suspicion for a left medial twelfth rib fracture. 3. Lumbar spondylosis and degenerative disc disease, causing mild to moderate impingement at L5-S1, and mild impingement at L2-3, L3-4, and L4-5, similar to the prior exam at these levels. Electronically Signed   By: Van Clines M.D.   On: 11/15/2020 12:04    Assessment/Plan  1. Weight loss Due to lack of appetite with intermittent n, v, constipation, and diarrhea. Discussed her case with Dr. Mariea Clonts and we recommend a CT of the abd along with GI lab panel.  Of note in Dr. Alvy Bimler note she was supposed to have a f/u EGD for prior hx of lymphoma and I don't see where this was completed.   2. Anxiety This seems to be a driver in her lack of progress back to [place] along with her GI symptoms. She wants to give the Ativan a few more days before trying an SSRI   3. Compression fracture of L1 vertebra with routine healing, subsequent encounter Improved Continue Tylenol 500 mg tid   4. Non-intractable vomiting with nausea, unspecified vomiting type Not currently present but has been an intermittent issue. Continue Zofran each morning.  See orders for work up   [employer] Communication: discussed with Ms. Delphia Grates and Dr. Mariea Clonts   Labs/tests ordered: CBC BMP hepatic function panel, amylase, lipase, and CT of the abd with oral contrast

## 2021-01-01 ENCOUNTER — Encounter: Payer: Self-pay | Admitting: Internal Medicine

## 2021-01-01 ENCOUNTER — Non-Acute Institutional Stay (SKILLED_NURSING_FACILITY): Payer: Medicare Other | Admitting: Internal Medicine

## 2021-01-01 DIAGNOSIS — S32010S Wedge compression fracture of first lumbar vertebra, sequela: Secondary | ICD-10-CM | POA: Diagnosis not present

## 2021-01-01 DIAGNOSIS — R634 Abnormal weight loss: Secondary | ICD-10-CM | POA: Diagnosis not present

## 2021-01-01 DIAGNOSIS — R110 Nausea: Secondary | ICD-10-CM | POA: Diagnosis not present

## 2021-01-01 DIAGNOSIS — F419 Anxiety disorder, unspecified: Secondary | ICD-10-CM

## 2021-01-01 DIAGNOSIS — K5901 Slow transit constipation: Secondary | ICD-10-CM

## 2021-01-01 NOTE — Progress Notes (Signed)
Location:  [facility] Room Number: 159 Place of Service:  SNF (31) Provider:  Lorane Cousar L. Mariea Clonts, D.O., C.M.D.  Gayland Curry, DO  Patient Care Team: Gayland Curry, DO as PCP - General (Geriatric Medicine) Rolm Bookbinder, MD as Consulting Physician (Dermatology) Marybelle Killings, MD as Consulting Physician (Orthopedic Surgery) Jola Schmidt, MD as Consulting Physician (Ophthalmology) Milus Banister, MD as Attending Physician (Gastroenterology) Regal, Tamala Fothergill, DPM as Consulting Physician (Podiatry) Lucas Mallow, MD as Consulting Physician (Urology) Jodi Marble, MD as Consulting Physician (Otolaryngology)  Extended Emergency Contact Information Primary Emergency Contact: [name] Address: [address]          [place], [place] [address] [name] of [place] Phone: [phone] Relation: Son Secondary Emergency Contact: [name] Address: [address] [place] [address] [name] of [place] Mobile Phone: [phone] Relation: Son  Code Status: full Goals of care: Advanced Directive information Advanced Directives 01/01/2021  Does Patient Have a Medical Advance Directive? Yes  Type of Advance Directive Living will;Healthcare Power of Attorney  Does patient want to make changes to medical advance directive? No - Patient declined  Copy of [place] in Chart? Yes - validated most recent copy scanned in chart (See row information)     Chief Complaint  Patient presents with  . Acute Visit    Anxiety     HPI:  Pt is a 87 y.o. female seen today for an acute visit for anxiety, persistent use of zofran each am for nausea (is scheduled now) and intermittent loose stools.  She has a CT abd/pelvis ordered for 1/24.  She feels like the right side of her abdomen lateral to her umbilicus is more prominent and tender than the left side.  I could also see this difference.  She's  previously had her small bowel resected.  Of note, she remains on the scheduled miralax daily and 2 senna daily (takes these in evening).  She had declined having this regimen put back to what she was on at home when NP suggested it.  We discussed a reduction in the regimen today to see if it would slow down her loose stools where she has watery bms and almost does not make it to the restroom in time.  She's not had an nausea recently.  Upon discussion with nursing, the supervisor had worked a weekend day and the resident's zofran was given later and she was nauseous that day (unclear if this was when she was still on her pain meds).  She reports no back pain at this point.  She also notes she is only anxious when she has the loose stool episodes.  Past Medical History:  Diagnosis Date  . Allergy   . Anxiety   . Arthritis   . Bursitis    right leg  . Family history of kidney cancer   . Family history of lung cancer   . Family history of uterine cancer   . GERD (gastroesophageal reflux disease)   . History of blood transfusion   . History of uterine cancer 01/19/2019  . Hyperlipidemia   . Hypertension   . Insomnia   . Non Hodgkin's lymphoma ([facility]) 2017-2019   cured  . Osteopenia    MILD  . Urine retention   . Uterine cancer ([facility])    at age 56   Past Surgical History:  Procedure Laterality Date  .  ABDOMINAL HYSTERECTOMY     AGE 82  . APPENDECTOMY     AGE 48  . BLADDER SURGERY    . Catarct Surgery    . ECTOPIC PREGNANCY SURGERY     age 6  . LAPAROSCOPIC SMALL BOWEL RESECTION    . NM PET DX LYMPHOMA  10/2018   IN REMISSION  . occular pressure    . OTHER SURGICAL HISTORY    . REPLACEMENT TOTAL KNEE BILATERAL    . TONSILLECTOMY AND ADENOIDECTOMY     age 74 or 6  . UTERINE CANCER SURGERY     age 4- located in vagina    Allergies  Allergen Reactions  . Cefuroxime   . Clindamycin/Lincomycin   . Levofloxacin   . Lisinopril   . Prochlorperazine Edisylate   . Psyllium    . Sulfa Antibiotics     Outpatient Encounter Medications as of 01/01/2021  Medication Sig  . Acetaminophen 500 MG capsule Take 500 mg by mouth 3 (three) times daily. 2 tabs  Three times daily  . bimatoprost (LUMIGAN) 0.01 % SOLN Place 1 drop into both eyes at bedtime.  . brimonidine (ALPHAGAN) 0.15 % ophthalmic solution Place 1 drop into both eyes 3 (three) times daily.  . cetirizine (ZYRTEC) 10 MG tablet Take 10 mg by mouth at bedtime.   . Cholecalciferol (VITAMIN D3) 50 MCG (2000 UT) CHEW Chew 1 capsule by mouth daily.  . eszopiclone (LUNESTA) 2 MG TABS tablet Take 1 tablet (2 mg total) by mouth at bedtime. Take immediately before bedtime  . LORazepam (ATIVAN) 0.5 MG tablet Take 1 tablet (0.5 mg total) by mouth every evening. Around suppertime  . losartan (COZAAR) 50 MG tablet TAKE 1 TABLET DAILY  . magnesium hydroxide (MILK OF MAGNESIA) 400 MG/5ML suspension Take by mouth daily as needed for mild constipation.  . nitrofurantoin (MACRODANTIN) 50 MG capsule Take 1 capsule (50 mg total) by mouth daily.  . ondansetron (ZOFRAN) 4 MG tablet Take 4 mg by mouth every 8 (eight) hours as needed for nausea or vomiting.  . ondansetron (ZOFRAN) 4 MG tablet Take 4 mg by mouth daily at 12 noon.  . polyethylene glycol (MIRALAX / GLYCOLAX) 17 g packet Take 17 g by mouth.  . Propylene Glycol 0.6 % SOLN Apply 1 drop to eye daily as needed.  . rosuvastatin (CRESTOR) 5 MG tablet TAKE 1 TABLET AT BEDTIME  . sennosides-docusate sodium (SENOKOT-S) 8.6-50 MG tablet Take 2 tablets by mouth daily.  . [DISCONTINUED] Lidocaine 4 % PTCH Apply topically.   No facility-administered encounter medications on file as of 01/01/2021.    Review of Systems  Constitutional: Positive for weight loss. Negative for chills, fever and malaise/fatigue.  HENT: Positive for hearing loss. Negative for congestion and sore throat.   Respiratory: Negative for cough and shortness of breath.   Cardiovascular: Negative for chest pain,  palpitations and leg swelling.  Gastrointestinal: Positive for diarrhea and nausea. Negative for abdominal pain, blood in stool, constipation, heartburn, melena and vomiting.  Genitourinary: Negative for dysuria.  Musculoskeletal: Negative for back pain, falls and joint pain.  Skin: Negative for rash.  Neurological: Negative for dizziness and loss of consciousness.  Psychiatric/Behavioral: Negative for depression and memory loss. The patient is nervous/anxious. The patient does not have insomnia.     Immunization History  Administered Date(s) Administered  . Influenza, High Dose Seasonal PF 09/26/2014, 08/17/2015, 08/19/2016, 08/20/2017, 08/24/2018, 09/23/2019, 10/05/2020  . Moderna Sars-Covid-2 Vaccination 12/27/2019, 01/24/2020, 10/25/2020  . Pneumococcal Conjugate-13 09/07/2014  .  Pneumococcal Polysaccharide-23 09/21/2007  . Tdap 05/23/2010  . Zoster 10/24/2005  . Zoster Recombinat (Shingrix) 09/22/2017, 11/25/2017   Pertinent  Health Maintenance Due  Topic Date Due  . INFLUENZA VACCINE  Completed  . DEXA SCAN  Completed  . PNA vac Low Risk Adult  Discontinued   Fall Risk  10/03/2020 07/25/2020 01/11/2020 12/15/2019 10/19/2019  Falls in the past year? 1 0 0 0 0  Number falls in past yr: 0 0 0 0 0  Injury with Fall? 1 0 0 0 0  Risk for fall due to : - - - - -  Follow up - - - - -   Functional Status Survey:    Vitals:   01/01/21 1458  BP: 139/75  Pulse: 75  Resp: 18  Temp: 97.7 F (36.5 C)  SpO2: 98%  Weight: 169 lb 3.2 oz (76.7 kg)  Height: 5\' 4"  (1.626 m)   Body mass index is 29.04 kg/m. Physical Exam Vitals reviewed.  Constitutional:      General: She is not in acute distress.    Appearance: Normal appearance. She is not toxic-appearing.  HENT:     Head: Normocephalic and atraumatic.     Ears:     Comments: Hearing aids Eyes:     Comments: glasses  Cardiovascular:     Rate and Rhythm: Normal rate and regular rhythm.  Pulmonary:     Effort: Pulmonary  effort is normal.     Breath sounds: Normal breath sounds.  Abdominal:     General: Bowel sounds are normal.     Palpations: Abdomen is soft.     Tenderness: There is no abdominal tenderness. There is no guarding or rebound.     Comments: Slight prominence to right of umbilicus vs left of umbilicus (note that pt had small bowel resected in the past)--soft with minimal tenderness--NO firm mass palpable or adenopathy  Musculoskeletal:        General: Normal range of motion.     Right lower leg: No edema.     Left lower leg: No edema.  Neurological:     General: No focal deficit present.     Mental Status: She is alert and oriented to person, place, and time.  Psychiatric:        Mood and Affect: Mood normal.     Labs reviewed: Recent Labs    01/03/20 0500  NA 142  K 4.3  CL 106  CO2 28*  BUN 32*  CREATININE 0.5  CALCIUM 9.6   Recent Labs    01/03/20 0500  ALBUMIN 4.5   Recent Labs    01/03/20 0500  WBC 4.6  HGB 13.2  HCT 40  PLT 176   No results found for: TSH No results found for: HGBA1C Lab Results  Component Value Date   CHOL 164 01/03/2020   HDL 64 01/03/2020   LDLCALC 76 01/03/2020   TRIG 116 01/03/2020   CHOLHDL 3 08/24/2018    Significant Diagnostic Results in last 30 days:  XR Lumbar Spine 2-3 Views  Result Date: 12/11/2020 AP lateral lumbar spine x-rays obtained reviewed this shows unchanged few millimeters anterolisthesis at L4-5.  50% L1 compression fracture unchanged from previous imaging last month. Impression: L1 compression fracture 50% compression and stable.   Assessment/Plan 1. Weight loss -intake was poor when she was having pain and is still decreased from her baseline, monitor this and await CT scan result  2. Anxiety -cont lorazepam which seems to have helped her  some  3. Slow transit constipation We changed miralax and 2 senna to every other night b/c we certainly don't want to cause iatrogenic diarrhea See if she has any  improvement May consider further reduction if needed  4. Nausea She wanted to keep the scheduled zofran  5. Compression fracture of L1 vertebra, sequela Pain resolved Getting around fairly well Now with GI issues which I'm concerned may simply be iatrogenic due to increasing her regimen to compensate for prior opioids and muscle relaxers  Family/ staff Communication: d/w rehab nurse  Labs/tests ordered:  Await CT abd/pelvis   Birtha Hatler L. Bronnie Vasseur, D.O. [facility] [address], [place] [address] Cell Phone (Mon-Fri 8am-5pm):  [phone] On Call:  [phone] & follow prompts after 5pm & weekends Office Phone:  [phone] Office Fax:  [phone]

## 2021-01-03 ENCOUNTER — Other Ambulatory Visit: Payer: Self-pay | Admitting: Internal Medicine

## 2021-01-03 ENCOUNTER — Other Ambulatory Visit: Payer: Self-pay | Admitting: Adult Health

## 2021-01-03 DIAGNOSIS — F419 Anxiety disorder, unspecified: Secondary | ICD-10-CM

## 2021-01-03 DIAGNOSIS — R748 Abnormal levels of other serum enzymes: Secondary | ICD-10-CM

## 2021-01-03 MED ORDER — LORAZEPAM 0.5 MG PO TABS
0.5000 mg | ORAL_TABLET | Freq: Every evening | ORAL | 1 refills | Status: DC
Start: 2021-01-03 — End: 2021-03-07

## 2021-01-07 ENCOUNTER — Ambulatory Visit
Admission: RE | Admit: 2021-01-07 | Discharge: 2021-01-07 | Disposition: A | Discharge status: Home / Self Care | Payer: Medicare Other | Source: Ambulatory Visit | Attending: Internal Medicine | Admitting: Internal Medicine

## 2021-01-07 DIAGNOSIS — D7389 Other diseases of spleen: Secondary | ICD-10-CM | POA: Diagnosis not present

## 2021-01-07 DIAGNOSIS — R748 Abnormal levels of other serum enzymes: Secondary | ICD-10-CM | POA: Diagnosis not present

## 2021-01-07 DIAGNOSIS — I700 Atherosclerosis of aorta: Secondary | ICD-10-CM | POA: Diagnosis not present

## 2021-01-07 DIAGNOSIS — M4856XA Collapsed vertebra, not elsewhere classified, lumbar region, initial encounter for fracture: Secondary | ICD-10-CM | POA: Diagnosis not present

## 2021-01-08 ENCOUNTER — Ambulatory Visit: Payer: Medicare Other | Admitting: Orthopaedic Surgery

## 2021-01-08 ENCOUNTER — Ambulatory Visit: Payer: Self-pay

## 2021-01-08 ENCOUNTER — Non-Acute Institutional Stay (SKILLED_NURSING_FACILITY): Payer: Medicare Other | Admitting: Internal Medicine

## 2021-01-08 ENCOUNTER — Ambulatory Visit (INDEPENDENT_AMBULATORY_CARE_PROVIDER_SITE_OTHER): Payer: Medicare Other | Admitting: Orthopaedic Surgery

## 2021-01-08 ENCOUNTER — Encounter: Payer: Self-pay | Admitting: Orthopaedic Surgery

## 2021-01-08 ENCOUNTER — Encounter: Payer: Self-pay | Admitting: Internal Medicine

## 2021-01-08 VITALS — Ht 64.0 in | Wt 169.0 lb

## 2021-01-08 DIAGNOSIS — R110 Nausea: Secondary | ICD-10-CM | POA: Diagnosis not present

## 2021-01-08 DIAGNOSIS — K5901 Slow transit constipation: Secondary | ICD-10-CM | POA: Diagnosis not present

## 2021-01-08 DIAGNOSIS — S32010A Wedge compression fracture of first lumbar vertebra, initial encounter for closed fracture: Secondary | ICD-10-CM | POA: Diagnosis not present

## 2021-01-08 DIAGNOSIS — S32010S Wedge compression fracture of first lumbar vertebra, sequela: Secondary | ICD-10-CM | POA: Diagnosis not present

## 2021-01-08 NOTE — Progress Notes (Signed)
Location:  [facility] Room Number: 159 Place of Service:  SNF (31) Provider:  Tiffany L. Mariea Clonts, D.O., C.M.D.  Gayland Curry, DO  Patient Care Team: Gayland Curry, DO as PCP - General (Geriatric Medicine) Rolm Bookbinder, MD as Consulting Physician (Dermatology) Marybelle Killings, MD as Consulting Physician (Orthopedic Surgery) Jola Schmidt, MD as Consulting Physician (Ophthalmology) Milus Banister, MD as Attending Physician (Gastroenterology) Regal, Tamala Fothergill, DPM as Consulting Physician (Podiatry) Lucas Mallow, MD as Consulting Physician (Urology) Jodi Marble, MD as Consulting Physician (Otolaryngology)  Extended Emergency Contact Information Primary Emergency Contact: [name] Address: [address]          [place], [place] [address] [name] of [place] Phone: [phone] Relation: Son Secondary Emergency Contact: [name] Address: [address] [place] [address] [name] of [place] Mobile Phone: [phone] Relation: Son  Code status:  Full code Goals of care: Advanced Directive information Advanced Directives 01/08/2021  Does Patient Have a Medical Advance Directive? Yes  Type of Advance Directive Living will;Healthcare Power of Attorney  Does patient want to make changes to medical advance directive? No - Patient declined  Copy of [place] in Chart? Yes - validated most recent copy scanned in chart (See row information)     Chief Complaint  Patient presents with  . Acute Visit    Acute visit for bowels     HPI:  Pt is a 87 y.o. female seen today for an acute visit for her bowels and to review CT results.  She has been here in rehab due to her compression fracture of her lumbar spine.  She is receiving skilled care.  Over the past week nursing has closely kept track of her bowel med use as well as her bowel urgency and bowel movements.  We had reduced  her bowel regimen to every other day due to this urgency and my concerns that her problems with her bowels were actually iatrogenic.  This week Ms. Salem Caster did have one episode of bowel urgency on Thursday and another spell of multiple loose soft stools on Saturday afternoon after a long walk over to independent living with her private aide.  She also had some difficulty Sunday when she refused her bowel regimen due to her prep for her CT scan.  Of note her CT results showed no difficulties in her bowels including no masses or obstruction.  She reports she did have some nausea this morning when she was doing the CT prep.  I met with her and her son and reviewed the CT as well as her ongoing issues.  Her goal is to go somewhere and not have to worry about where the bathroom is.  She has shooting pains in her back when anything happens in her bowels if she is upright.  We reviewed dermatomal distributions and nerve related pains.  To improve in time.  [name] shared that he is added to home health aide for her 5 to 6 days a week at least for couple of months.  He experimented with an elevated toilet seat in independent living and she is concerned that she will not be able to tolerate going back there with her bowels in this situation.  However, she really does want her privacy back.   Past Medical History:  Diagnosis Date  . Allergy   . Anxiety   . Arthritis   .  Bursitis    right leg  . Family history of kidney cancer   . Family history of lung cancer   . Family history of uterine cancer   . GERD (gastroesophageal reflux disease)   . History of blood transfusion   . History of uterine cancer 01/19/2019  . Hyperlipidemia   . Hypertension   . Insomnia   . Non Hodgkin's lymphoma ([facility]) 2017-2019   cured  . Osteopenia    MILD  . Urine retention   . Uterine cancer ([facility])    at age 69   Past Surgical History:  Procedure Laterality Date  . ABDOMINAL HYSTERECTOMY     AGE 24  . APPENDECTOMY     AGE  65  . BLADDER SURGERY    . Catarct Surgery    . ECTOPIC PREGNANCY SURGERY     age 5  . LAPAROSCOPIC SMALL BOWEL RESECTION    . NM PET DX LYMPHOMA  10/2018   IN REMISSION  . occular pressure    . OTHER SURGICAL HISTORY    . REPLACEMENT TOTAL KNEE BILATERAL    . TONSILLECTOMY AND ADENOIDECTOMY     age 4 or 25  . UTERINE CANCER SURGERY     age 78- located in vagina    Allergies  Allergen Reactions  . Cefuroxime   . Clindamycin/Lincomycin   . Levofloxacin   . Lisinopril   . Prochlorperazine Edisylate   . Psyllium   . Sulfa Antibiotics     Outpatient Encounter Medications as of 01/08/2021  Medication Sig  . Acetaminophen 500 MG capsule Take 500 mg by mouth 3 (three) times daily. 2 tabs  Three times daily  . bimatoprost (LUMIGAN) 0.01 % SOLN Place 1 drop into both eyes at bedtime.  . brimonidine (ALPHAGAN) 0.15 % ophthalmic solution Place 1 drop into both eyes 3 (three) times daily.  . cetirizine (ZYRTEC) 10 MG tablet Take 10 mg by mouth at bedtime.   . Cholecalciferol (VITAMIN D3) 50 MCG (2000 UT) CHEW Chew 1 capsule by mouth daily.  . eszopiclone (LUNESTA) 2 MG TABS tablet Take 1 tablet (2 mg total) by mouth at bedtime. Take immediately before bedtime  . LORazepam (ATIVAN) 0.5 MG tablet Take 1 tablet (0.5 mg total) by mouth every evening. Around suppertime  . losartan (COZAAR) 50 MG tablet TAKE 1 TABLET DAILY  . magnesium hydroxide (MILK OF MAGNESIA) 400 MG/5ML suspension Take by mouth daily as needed for mild constipation.  . nitrofurantoin (MACRODANTIN) 50 MG capsule Take 1 capsule (50 mg total) by mouth daily.  . ondansetron (ZOFRAN) 4 MG tablet Take 4 mg by mouth every 8 (eight) hours as needed for nausea or vomiting.  . ondansetron (ZOFRAN) 4 MG tablet Take 4 mg by mouth daily at 12 noon.  . polyethylene glycol (MIRALAX / GLYCOLAX) 17 g packet Take 17 g by mouth.  . Propylene Glycol 0.6 % SOLN Apply 1 drop to eye daily as needed.  . rosuvastatin (CRESTOR) 5 MG tablet TAKE  1 TABLET AT BEDTIME  . sennosides-docusate sodium (SENOKOT-S) 8.6-50 MG tablet Take 2 tablets by mouth daily.   No facility-administered encounter medications on file as of 01/08/2021.    Review of Systems  Constitutional: Negative for chills and fever.  HENT: Positive for hearing loss.        Hearing aids  Eyes: Negative for blurred vision.       Glasses  Respiratory: Negative for cough and shortness of breath.   Cardiovascular: Negative for chest pain,  palpitations and leg swelling.  Gastrointestinal: Positive for nausea. Negative for abdominal pain, blood in stool, constipation, diarrhea, melena and vomiting.  Genitourinary: Negative for dysuria.       Does I/O caths  Musculoskeletal: Positive for back pain. Negative for falls and joint pain.       Back only bothersome when bowels are "acting up"  Neurological: Negative for dizziness and loss of consciousness.  Psychiatric/Behavioral: Negative for depression and memory loss. The patient is nervous/anxious. The patient does not have insomnia (sleeps well with lunesta).     Immunization History  Administered Date(s) Administered  . Influenza, High Dose Seasonal PF 09/26/2014, 08/17/2015, 08/19/2016, 08/20/2017, 08/24/2018, 09/23/2019, 10/05/2020  . Moderna Sars-Covid-2 Vaccination 12/27/2019, 01/24/2020, 10/25/2020  . Pneumococcal Conjugate-13 09/07/2014  . Pneumococcal Polysaccharide-23 09/21/2007  . Tdap 05/23/2010  . Zoster 10/24/2005  . Zoster Recombinat (Shingrix) 09/22/2017, 11/25/2017   Pertinent  Health Maintenance Due  Topic Date Due  . INFLUENZA VACCINE  Completed  . DEXA SCAN  Completed  . PNA vac Low Risk Adult  Discontinued   Fall Risk  10/03/2020 07/25/2020 01/11/2020 12/15/2019 10/19/2019  Falls in the past year? 1 0 0 0 0  Number falls in past yr: 0 0 0 0 0  Injury with Fall? 1 0 0 0 0  Risk for fall due to : - - - - -  Follow up - - - - -   Functional Status Survey:    Vitals:   01/08/21 1132  BP:  114/70  Pulse: 71  Temp: 98 F (36.7 C)  SpO2: 97%  Weight: 169 lb 3.2 oz (76.7 kg)  Height: 5' 4" (1.626 m)   Body mass index is 29.04 kg/m. Physical Exam Vitals reviewed.  Constitutional:      General: She is not in acute distress.    Appearance: Normal appearance. She is not toxic-appearing.  HENT:     Head: Normocephalic and atraumatic.  Cardiovascular:     Rate and Rhythm: Normal rate and regular rhythm.  Pulmonary:     Effort: Pulmonary effort is normal.     Breath sounds: Normal breath sounds. No wheezing, rhonchi or rales.  Abdominal:     General: Bowel sounds are normal.     Palpations: Abdomen is soft.     Tenderness: There is no abdominal tenderness. There is no guarding or rebound.  Musculoskeletal:        General: Normal range of motion.     Right lower leg: No edema.     Left lower leg: No edema.  Neurological:     General: No focal deficit present.     Mental Status: She is alert and oriented to person, place, and time.  Psychiatric:        Mood and Affect: Mood normal.     Labs reviewed: Recent Labs    12/25/20 0000  NA 141  K 3.7  CL 103  CO2 26*  BUN 14  CREATININE 0.5  CALCIUM 8.7   Recent Labs    12/25/20 0000  AST 22  ALT 11  ALBUMIN 3.3*   Recent Labs    12/25/20 0000  WBC 3.1  HGB 12.3  HCT 37  PLT 202   No results found for: TSH No results found for: HGBA1C Lab Results  Component Value Date   CHOL 164 01/03/2020   HDL 64 01/03/2020   LDLCALC 76 01/03/2020   TRIG 116 01/03/2020   CHOLHDL 3 08/24/2018    Significant Diagnostic Results  in last 30 days:  CT ABDOMEN WO CONTRAST  Result Date: 01/07/2021 CLINICAL DATA:  Elevated lipase EXAM: CT ABDOMEN WITHOUT CONTRAST TECHNIQUE: Multidetector CT imaging of the abdomen was performed following the standard protocol without IV contrast. COMPARISON:  12/19/2018.  Lumbar spine images 11/13/2020 FINDINGS: Lower chest: No acute abnormality.  Aortic atherosclerosis.  Hepatobiliary: Scattered calcifications compatible with old granulomatous disease, stable. Gallbladder unremarkable. Pancreas: No focal abnormality or ductal dilatation. Spleen: Scattered calcifications, stable.  Normal size. Adrenals/Urinary Tract: No renal or adrenal mass. No hydronephrosis. Stomach/Bowel: Stomach, visualized large and small bowel unremarkable. Vascular/Lymphatic: Aortic atherosclerosis. No evidence of aneurysm or adenopathy. Other: No free fluid or free air. Musculoskeletal: Severe compression fracture at the L1 vertebral body containing gas, stable since prior lumbar spine images. IMPRESSION: No CT evidence for pancreatitis. Old granulomatous disease in the liver and spleen. Aortic atherosclerosis. Severe chronic compression fracture involving the L1 vertebral body. Electronically Signed   By: Rolm Baptise M.D.   On: 01/07/2021 21:38   XR Lumbar Spine 2-3 Views  Result Date: 12/11/2020 AP lateral lumbar spine x-rays obtained reviewed this shows unchanged few millimeters anterolisthesis at L4-5.  50% L1 compression fracture unchanged from previous imaging last month. Impression: L1 compression fracture 50% compression and stable.   Assessment/Plan 1. Slow transit constipation -Return to normal bowel regimen she took at home which was to Senokot for 3 days and then 1 dose of MiraLAX on the fourth day and repeat -Continue to monitor bowel pattern  2. Nausea -She agrees to changing Zofran to daily as needed and knows she has to ask for it if she feels nauseous  3. Compression fracture of L1 vertebra, sequela -Healing quite well and stable per CT scan and visit with Dr. Lorin Mercy -It appears to me that she is reached a point where she can move to assisted living if this week on a new bowel and nausea regimen goes well    Family/ staff Communication: Discussed with her son [name] as well as rehab staff  Labs/tests ordered:  none  Marasia Newhall L. Dann Ventress, D.O. [facility] [address], [place] [address] Cell Phone (Mon-Fri 8am-5pm):  [phone] On Call:  [phone] & follow prompts after 5pm & weekends Office Phone:  [phone] Office Fax:  [phone]

## 2021-01-08 NOTE — Progress Notes (Signed)
Reviewed with patient and her son during visit in rehab today.

## 2021-01-08 NOTE — Progress Notes (Signed)
Office Visit Note   Patient: Linda [name] [id number]           Date of Birth: 1932/12/09           MRN: [id number] Visit Date: 01/08/2021              Requested by: Gayland Curry, DO [address] [place],  [place] [address] PCP: Gayland Curry, DO   Assessment & Plan: Visit Diagnoses:  1. Compression fracture of L1 vertebra, initial encounter [facility])     Plan: Patient is making progress with mobility.  Still has problems with sitting but does well when she is upright.  She has been sleeping in a recliner part of the time.  She is off pain medication.  She still working on her digestive problems with chronic constipation and had previous partial bowel resection.  She has been on a bowel regiment for this.  Her L1 compression fracture looks stable and she can follow-up with me on an as-needed basis.  She is noting gradual improvement with mobility and decrease in her pain except with sitting position currently.  Follow-Up Instructions: No follow-ups on file.   Orders:  Orders Placed This Encounter  Procedures  . XR Lumbar Spine 2-3 Views   No orders of the defined types were placed in this encounter.     Procedures: No procedures performed   Clinical Data: No additional findings.   Subjective: Chief Complaint  Patient presents with  . Lower Back - Follow-up, Fracture    L1 compression fracture    HPI follow-up L1 compression fracture.  She is much more comfortable and has no pain as long she supine also has no pain when she is upright.  When she sitting she has increased pain.  Recent CT of the abdomen for continued problems with constipation problems and sometimes diarrhea showed unchanged L1 compression fracture with some air intermixed with disc.  Since last discussion in December she is totally off pain medication just using Tylenol.  She is ambulatory with a rolling walker.  She is previously an independent living at [facility] and return to this position is her  goal.  Review of Systems Updated.  Continued problems abdominal pain gurgling constipation problems.  Objective: Vital Signs: Ht 5\' 4"  (1.626 m)   Wt 169 lb (76.7 kg)   BMI 29.01 kg/m   Physical Exam Constitutional:      Appearance: She is well-developed.  HENT:     Head: Normocephalic.     Right Ear: External ear normal.     Left Ear: External ear normal.  Eyes:     Pupils: Pupils are equal, round, and reactive to light.  Neck:     Thyroid: No thyromegaly.     Trachea: No tracheal deviation.  Cardiovascular:     Rate and Rhythm: Normal rate.  Pulmonary:     Effort: Pulmonary effort is normal.  Abdominal:     Palpations: Abdomen is soft.  Skin:    General: Skin is warm and dry.  Neurological:     Mental Status: She is alert and oriented to person, place, and time.  Psychiatric:        Mood and Affect: Mood and affect normal.        Behavior: Behavior normal.      Ortho Exam patient has intact quads hamstrings hip flexors toe flexion extension.   Specialty Comments:  No specialty comments available.  Imaging: CT ABDOMEN WO CONTRAST  Result Date: 01/07/2021 CLINICAL  DATA:  Elevated lipase EXAM: CT ABDOMEN WITHOUT CONTRAST TECHNIQUE: Multidetector CT imaging of the abdomen was performed following the standard protocol without IV contrast. COMPARISON:  12/19/2018.  Lumbar spine images 11/13/2020 FINDINGS: Lower chest: No acute abnormality.  Aortic atherosclerosis. Hepatobiliary: Scattered calcifications compatible with old granulomatous disease, stable. Gallbladder unremarkable. Pancreas: No focal abnormality or ductal dilatation. Spleen: Scattered calcifications, stable.  Normal size. Adrenals/Urinary Tract: No renal or adrenal mass. No hydronephrosis. Stomach/Bowel: Stomach, visualized large and small bowel unremarkable. Vascular/Lymphatic: Aortic atherosclerosis. No evidence of aneurysm or adenopathy. Other: No free fluid or free air. Musculoskeletal: Severe compression  fracture at the L1 vertebral body containing gas, stable since prior lumbar spine images. IMPRESSION: No CT evidence for pancreatitis. Old granulomatous disease in the liver and spleen. Aortic atherosclerosis. Severe chronic compression fracture involving the L1 vertebral body. Electronically Signed   By: Rolm Baptise M.D.   On: 01/07/2021 21:38     PMFS History: Patient Active Problem List   Diagnosis Date Noted  . Lumbar compression fracture ([place]) 11/13/2020  . Rash 12/15/2019  . Venous insufficiency of both lower extremities 07/15/2019  . Environmental allergies 02/23/2019  . Osteoporosis with pathological fracture 02/23/2019  . Senile purpura ([place]) 02/23/2019  . History of uterine cancer 01/19/2019  . Family history of uterine cancer   . Family history of kidney cancer   . Family history of lung cancer   . Fracture of right acetabulum ([place]) 01/11/2019  . Chronic constipation with overflow incontinence 12/31/2018  . Physical deconditioning 12/31/2018  . Spinal stenosis of lumbar region without neurogenic claudication 12/03/2018  . Lumbar foraminal stenosis 12/03/2018  . 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 Seasonal allergies 10/20/2018  . Trochanteric bursitis, right hip 09/14/2018  . Chronic bilateral low back pain 09/14/2018  . Hoarseness of voice 04/08/2018  . Hypertension, essential, benign 01/16/2018  . Sensorineural hearing loss (SNHL) of both ears 01/16/2018  . Insomnia 01/16/2018  . 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 Osteopenia of both hips 01/15/2018  . Hyperlipidemia 01/15/2018  . DJD (degenerative joint disease) of knee 02/13/2011  . H/O difficult intubation 02/12/2011  . Dry eye syndrome 04/10/2010  . Incomplete emptying of bladder 08/31/2009  . Borderline glaucoma with ocular hypertension 09/07/2008   Past Medical History:  Diagnosis Date  . Allergy   . Anxiety   . Arthritis   . Bursitis    right leg  . Family history of kidney cancer   .  Family history of lung cancer   . Family history of uterine cancer   . GERD (gastroesophageal reflux disease)   . History of blood transfusion   . History of uterine cancer 01/19/2019  . Hyperlipidemia   . Hypertension   . Insomnia   . Non Hodgkin's lymphoma ([place]) 2017-2019   cured  . Osteopenia    MILD  . Urine retention   . Uterine cancer ([place])    at age 40    Family History  Problem Relation Age of Onset  . Uterine cancer Sister 39       uterine cancer  . COPD Maternal Grandfather   . Lung cancer Maternal Grandfather   . Heart failure Mother 74  . Pneumonia Father 52  . Dementia Father   . Uterine cancer Maternal Grandmother 62  . Other Sister        Guillain Barre syndrom  . Kidney cancer Son 22  . Dementia Paternal Grandmother   . Lung cancer Maternal Uncle   .  Other Son        Familial Hypercholesterolemia  . Colon cancer Neg Hx   . Esophageal cancer Neg Hx   . Liver cancer Neg Hx   . Pancreatic cancer Neg Hx   . Rectal cancer Neg Hx   . Stomach cancer Neg Hx     Past Surgical History:  Procedure Laterality Date  . ABDOMINAL HYSTERECTOMY     AGE 31  . APPENDECTOMY     AGE 32  . BLADDER SURGERY    . Catarct Surgery    . ECTOPIC PREGNANCY SURGERY     age 55  . LAPAROSCOPIC SMALL BOWEL RESECTION    . NM PET DX LYMPHOMA  10/2018   IN REMISSION  . occular pressure    . OTHER SURGICAL HISTORY    . REPLACEMENT TOTAL KNEE BILATERAL    . TONSILLECTOMY AND ADENOIDECTOMY     age 62 or 19  . UTERINE CANCER SURGERY     age 56- located in vagina   Social History   Occupational History  . Occupation: retired  Tobacco Use  . Smoking status: Never Smoker  . Smokeless tobacco: Never Used  Vaping Use  . Vaping Use: Never used  Substance and Sexual Activity  . Alcohol use: Yes    Alcohol/week: 7.0 standard drinks    Types: 7 Glasses of wine per week    Comment: one glass of wine with dinner  . Drug use: No  . Sexual activity: Never

## 2021-01-08 NOTE — Progress Notes (Signed)
A user error has taken place: encounter opened in error, closed for administrative reasons.

## 2021-01-16 ENCOUNTER — Encounter: Payer: Self-pay | Admitting: Internal Medicine

## 2021-01-16 ENCOUNTER — Non-Acute Institutional Stay (SKILLED_NURSING_FACILITY): Payer: Medicare Other | Admitting: Internal Medicine

## 2021-01-16 DIAGNOSIS — F419 Anxiety disorder, unspecified: Secondary | ICD-10-CM | POA: Diagnosis not present

## 2021-01-16 DIAGNOSIS — S32010S Wedge compression fracture of first lumbar vertebra, sequela: Secondary | ICD-10-CM

## 2021-01-16 DIAGNOSIS — K5901 Slow transit constipation: Secondary | ICD-10-CM

## 2021-01-16 DIAGNOSIS — R110 Nausea: Secondary | ICD-10-CM | POA: Diagnosis not present

## 2021-01-25 NOTE — Progress Notes (Signed)
Location:  [facility] Room Number: 159 Place of Service:  SNF (31) Provider:  Trelon Plush L. Mariea Clonts, D.O., C.M.D.  Gayland Curry, DO  Patient Care Team: Gayland Curry, DO as PCP - General (Geriatric Medicine) Rolm Bookbinder, MD as Consulting Physician (Dermatology) Marybelle Killings, MD as Consulting Physician (Orthopedic Surgery) Jola Schmidt, MD as Consulting Physician (Ophthalmology) Milus Banister, MD as Attending Physician (Gastroenterology) Regal, Tamala Fothergill, DPM as Consulting Physician (Podiatry) Lucas Mallow, MD as Consulting Physician (Urology) Jodi Marble, MD as Consulting Physician (Otolaryngology)  Extended Emergency Contact Information Primary Emergency Contact: [name] Address: [address]          [place], [place] [address] [name] of [place] Phone: [phone] Relation: Son Secondary Emergency Contact: [name] Address: [address] [place] [address] [name] of [place] Mobile Phone: [phone] Relation: Son   Goals of care: Advanced Directive information Advanced Directives 01/16/2021  Does Patient Have a Medical Advance Directive? Yes  Type of Advance Directive Living will;Healthcare Power of Attorney  Does patient want to make changes to medical advance directive? -  Copy of [place] in Chart? Yes - validated most recent copy scanned in chart (See row information)     Chief Complaint  Patient presents with  . Acute Visit    F/u on bowels and nausea and plans to move to enhanced AL 2/15    HPI:  Pt is a 87 y.o. female seen today for an acute visit for f/u on bowels, nausea and plans to move into enhanced AL on 2/15.    Linda [name] is no longer having nausea the majority of the time.  She reports using zofran one morning but there is no record of this from 4 days ago as she says.    Her bowels are working well back on her home regimen of senokot  for 2 days, then miralax.  She feels well and is eager to be in an environment with more independence and privacy.    Past Medical History:  Diagnosis Date  . Allergy   . Anxiety   . Arthritis   . Bursitis    right leg  . Family history of kidney cancer   . Family history of lung cancer   . Family history of uterine cancer   . GERD (gastroesophageal reflux disease)   . History of blood transfusion   . History of uterine cancer 01/19/2019  . Hyperlipidemia   . Hypertension   . Insomnia   . Non Hodgkin's lymphoma ([place]) 2017-2019   cured  . Osteopenia    MILD  . Urine retention   . Uterine cancer ([place])    at age 87   Past Surgical History:  Procedure Laterality Date  . ABDOMINAL HYSTERECTOMY     AGE 72  . APPENDECTOMY     AGE 65  . BLADDER SURGERY    . Catarct Surgery    . ECTOPIC PREGNANCY SURGERY     age 50  . LAPAROSCOPIC SMALL BOWEL RESECTION    . NM PET DX LYMPHOMA  10/2018   IN REMISSION  . occular pressure    . OTHER SURGICAL HISTORY    . REPLACEMENT TOTAL KNEE BILATERAL    . TONSILLECTOMY AND ADENOIDECTOMY     age 18 or 74  . UTERINE CANCER SURGERY     age 27- located in vagina  Allergies  Allergen Reactions  . Cefuroxime   . Clindamycin/Lincomycin   . Levofloxacin   . Lisinopril   . Prochlorperazine Edisylate   . Psyllium   . Sulfa Antibiotics     Outpatient Encounter Medications as of 01/16/2021  Medication Sig  . Acetaminophen 500 MG capsule Take 500 mg by mouth 3 (three) times daily. 2 tabs  Three times daily  . bimatoprost (LUMIGAN) 0.01 % SOLN Place 1 drop into both eyes at bedtime.  . brimonidine (ALPHAGAN) 0.15 % ophthalmic solution Place 1 drop into both eyes 3 (three) times daily.  . cetirizine (ZYRTEC) 10 MG tablet Take 10 mg by mouth at bedtime.   . Cholecalciferol (VITAMIN D3) 50 MCG (2000 UT) CHEW Chew 1 capsule by mouth daily.  . eszopiclone (LUNESTA) 2 MG TABS tablet Take 1 tablet (2 mg total) by mouth at bedtime. Take immediately  before bedtime  . LORazepam (ATIVAN) 0.5 MG tablet Take 1 tablet (0.5 mg total) by mouth every evening. Around suppertime  . losartan (COZAAR) 50 MG tablet TAKE 1 TABLET DAILY  . magnesium hydroxide (MILK OF MAGNESIA) 400 MG/5ML suspension Take by mouth daily as needed for mild constipation.  . nitrofurantoin (MACRODANTIN) 50 MG capsule Take 1 capsule (50 mg total) by mouth daily.  . ondansetron (ZOFRAN) 4 MG tablet Take 4 mg by mouth every 8 (eight) hours as needed for nausea or vomiting.  . ondansetron (ZOFRAN) 4 MG tablet Take 4 mg by mouth daily at 12 noon.  . polyethylene glycol (MIRALAX / GLYCOLAX) 17 g packet Take 17 g by mouth.  . Propylene Glycol 0.6 % SOLN Apply 1 drop to eye daily as needed.  . rosuvastatin (CRESTOR) 5 MG tablet TAKE 1 TABLET AT BEDTIME  . sennosides-docusate sodium (SENOKOT-S) 8.6-50 MG tablet Take 2 tablets by mouth daily.   No facility-administered encounter medications on file as of 01/16/2021.    Review of Systems  Constitutional: Negative for chills and fever.  HENT: Negative for congestion and sore throat.   Respiratory: Negative for cough and shortness of breath.   Cardiovascular: Negative for chest pain, palpitations and leg swelling.  Gastrointestinal: Negative for abdominal pain, constipation, diarrhea, nausea and vomiting.  Genitourinary: Negative for dysuria.       I/O caths  Musculoskeletal: Negative for back pain and falls.  Neurological: Negative for dizziness and loss of consciousness.  Endo/Heme/Allergies: Bruises/bleeds easily.  Psychiatric/Behavioral: Negative for depression. The patient is nervous/anxious.        Mild cognitive impairment    Immunization History  Administered Date(s) Administered  . Influenza, High Dose Seasonal PF 09/26/2014, 08/17/2015, 08/19/2016, 08/20/2017, 08/24/2018, 09/23/2019, 10/05/2020  . Moderna Sars-Covid-2 Vaccination 12/27/2019, 01/24/2020, 10/25/2020  . Pneumococcal Conjugate-13 09/07/2014  .  Pneumococcal Polysaccharide-23 09/21/2007  . Tdap 05/23/2010  . Zoster 10/24/2005  . Zoster Recombinat (Shingrix) 09/22/2017, 11/25/2017   Pertinent  Health Maintenance Due  Topic Date Due  . INFLUENZA VACCINE  Completed  . DEXA SCAN  Completed  . PNA vac Low Risk Adult  Discontinued   Fall Risk  10/03/2020 07/25/2020 01/11/2020 12/15/2019 10/19/2019  Falls in the past year? 1 0 0 0 0  Number falls in past yr: 0 0 0 0 0  Injury with Fall? 1 0 0 0 0  Risk for fall due to : - - - - -  Follow up - - - - -   Functional Status Survey:    Vitals:   01/16/21 1442  BP: 139/78  Pulse: 83  Temp: (!) 97 F (36.1 C)  Weight: 169 lb 9.6 oz (76.9 kg)  Height: 5\' 4"  (1.626 m)   Body mass index is 29.11 kg/m. Physical Exam Vitals reviewed.  Constitutional:      General: She is not in acute distress.    Appearance: Normal appearance. She is not toxic-appearing.  HENT:     Head: Normocephalic and atraumatic.     Ears:     Comments: hearing aids Eyes:     Comments: glasses  Cardiovascular:     Rate and Rhythm: Normal rate and regular rhythm.     Pulses: Normal pulses.     Heart sounds: Normal heart sounds.  Pulmonary:     Effort: Pulmonary effort is normal.     Breath sounds: Normal breath sounds.  Abdominal:     General: Bowel sounds are normal. There is no distension.     Palpations: Abdomen is soft.     Tenderness: There is no abdominal tenderness. There is no guarding or rebound.  Musculoskeletal:        General: Normal range of motion.     Right lower leg: No edema.     Left lower leg: No edema.  Neurological:     General: No focal deficit present.     Mental Status: She is alert and oriented to person, place, and time.     Comments: Using rollator walker  Psychiatric:        Mood and Affect: Mood normal.     Labs reviewed: Recent Labs    12/25/20 0000  NA 141  K 3.7  CL 103  CO2 26*  BUN 14  CREATININE 0.5  CALCIUM 8.7   Recent Labs    12/25/20 0000   AST 22  ALT 11  ALBUMIN 3.3*   Recent Labs    12/25/20 0000  WBC 3.1  HGB 12.3  HCT 37  PLT 202   No results found for: TSH No results found for: HGBA1C Lab Results  Component Value Date   CHOL 164 01/03/2020   HDL 64 01/03/2020   LDLCALC 76 01/03/2020   TRIG 116 01/03/2020   CHOLHDL 3 08/24/2018    Significant Diagnostic Results in last 30 days:  CT ABDOMEN WO CONTRAST  Result Date: 01/07/2021 CLINICAL DATA:  Elevated lipase EXAM: CT ABDOMEN WITHOUT CONTRAST TECHNIQUE: Multidetector CT imaging of the abdomen was performed following the standard protocol without IV contrast. COMPARISON:  12/19/2018.  Lumbar spine images 11/13/2020 FINDINGS: Lower chest: No acute abnormality.  Aortic atherosclerosis. Hepatobiliary: Scattered calcifications compatible with old granulomatous disease, stable. Gallbladder unremarkable. Pancreas: No focal abnormality or ductal dilatation. Spleen: Scattered calcifications, stable.  Normal size. Adrenals/Urinary Tract: No renal or adrenal mass. No hydronephrosis. Stomach/Bowel: Stomach, visualized large and small bowel unremarkable. Vascular/Lymphatic: Aortic atherosclerosis. No evidence of aneurysm or adenopathy. Other: No free fluid or free air. Musculoskeletal: Severe compression fracture at the L1 vertebral body containing gas, stable since prior lumbar spine images. IMPRESSION: No CT evidence for pancreatitis. Old granulomatous disease in the liver and spleen. Aortic atherosclerosis. Severe chronic compression fracture involving the L1 vertebral body. Electronically Signed   By: Rolm Baptise M.D.   On: 01/07/2021 21:38    Assessment/Plan 1. Slow transit constipation -back to home regimen and this is working well for her  2. Nausea -not using zofran now but available prn   3. Anxiety -improved also and seems ready for her move  4. Compression fracture of L1 vertebra, sequela -recovering well -cont  rollator walker use to prevent recurrent  fall  Family/ staff Communication: d/w rehab nurse  Labs/tests ordered:  No new  Bertis Hustead L. Mattilynn Forrer, D.O. [facility] [address], [place] [address] Cell Phone (Mon-Fri 8am-5pm):  [phone] On Call:  [phone] & follow prompts after 5pm & weekends Office Phone:  [phone] Office Fax:  [phone]

## 2021-02-04 ENCOUNTER — Encounter: Payer: Self-pay | Admitting: Internal Medicine

## 2021-03-06 ENCOUNTER — Encounter: Payer: Medicare Other | Admitting: Internal Medicine

## 2021-03-07 ENCOUNTER — Encounter: Payer: Self-pay | Admitting: Adult Health

## 2021-03-07 ENCOUNTER — Non-Acute Institutional Stay: Payer: Medicare Other | Admitting: Adult Health

## 2021-03-07 DIAGNOSIS — R339 Retention of urine, unspecified: Secondary | ICD-10-CM

## 2021-03-07 DIAGNOSIS — I10 Essential (primary) hypertension: Secondary | ICD-10-CM | POA: Diagnosis not present

## 2021-03-07 DIAGNOSIS — F419 Anxiety disorder, unspecified: Secondary | ICD-10-CM

## 2021-03-07 DIAGNOSIS — E785 Hyperlipidemia, unspecified: Secondary | ICD-10-CM

## 2021-03-07 DIAGNOSIS — G8929 Other chronic pain: Secondary | ICD-10-CM

## 2021-03-07 DIAGNOSIS — M545 Low back pain, unspecified: Secondary | ICD-10-CM | POA: Diagnosis not present

## 2021-03-07 MED ORDER — LORAZEPAM 0.5 MG PO TABS: 0.2500 mg | ORAL_TABLET | Freq: Every day | ORAL | 1 refills | Status: DC | PRN

## 2021-03-07 NOTE — Progress Notes (Signed)
Location:  [facility] Room Number: 892-J Place of Service:  ALF [phone]) Provider:  Royal Hawthorn, NP   Patient Care Team: Virgie Dad, MD as PCP - General (Internal Medicine) Rolm Bookbinder, MD as Consulting Physician (Dermatology) Marybelle Killings, MD as Consulting Physician (Orthopedic Surgery) Jola Schmidt, MD as Consulting Physician (Ophthalmology) Milus Banister, MD as Attending Physician (Gastroenterology) Regal, Tamala Fothergill, DPM as Consulting Physician (Podiatry) Lucas Mallow, MD as Consulting Physician (Urology) Jodi Marble, MD as Consulting Physician (Otolaryngology)  Extended Emergency Contact Information Primary Emergency Contact: [name] Address: [address]          [place], [place] [address] [name] of [place] Phone: [phone] Relation: Son Secondary Emergency Contact: [name] Address: [address] [place] [address] [name] of [place] Mobile Phone: [phone] Relation: Son  Code Status:  Full Code   Goals of care: Advanced Directive information Advanced Directives 03/07/2021  Does Patient Have a Medical Advance Directive? Yes  Type of Paramedic of [place];Living will  Does patient want to make changes to medical advance directive? No - Patient declined  Copy of [place] in Chart? Yes - validated most recent copy scanned in chart (See row information)     Chief Complaint  Patient presents with  . Medical Management of Chronic Issues    Routine visit     HPI:  Pt is a 87 y.o. female seen today for medical management of chronic diseases.    Feels like she has lost control as she can not administer her own meds in AL.   Self caths 7-8x per day in a 24 hr period. Takes macrodantin to prevent UTIs.  No current symptoms (has urinary retention after bladder surgery)  Pt would like to cut down Ativan dose. Feels  she is sleeping well and does not have anxiety.   BP well controlled  She is asking about taking her own meds rather than having the AL nurse administer   Past Medical History:  Diagnosis Date  . Allergy   . Anxiety   . Arthritis   . Bursitis    right leg  . Family history of kidney cancer   . Family history of lung cancer   . Family history of uterine cancer   . GERD (gastroesophageal reflux disease)   . History of blood transfusion   . History of uterine cancer 01/19/2019  . Hyperlipidemia   . Hypertension   . Insomnia   . Non Hodgkin's lymphoma ([facility]) 2017-2019   cured  . Osteopenia    MILD  . Urine retention   . Uterine cancer ([facility])    at age 64   Past Surgical History:  Procedure Laterality Date  . ABDOMINAL HYSTERECTOMY     AGE 64  . APPENDECTOMY     AGE 74  . BLADDER SURGERY    . Catarct Surgery    . ECTOPIC PREGNANCY SURGERY     age 69  . LAPAROSCOPIC SMALL BOWEL RESECTION    . NM PET DX LYMPHOMA  10/2018   IN REMISSION  . occular pressure    . OTHER SURGICAL HISTORY    . REPLACEMENT TOTAL KNEE BILATERAL    . TONSILLECTOMY AND ADENOIDECTOMY     age 30 or 11  . UTERINE CANCER SURGERY     age 66- located in vagina    Allergies  Allergen Reactions  . Cefuroxime   . Clindamycin/Lincomycin   . Levofloxacin   . Lisinopril   . Prochlorperazine Edisylate   . Psyllium   . Sulfa Antibiotics     Outpatient Encounter Medications as of 03/07/2021  Medication Sig  . Acetaminophen 500 MG capsule Take 500 mg by mouth 3 (three) times daily. 2 tabs  Three times daily  . antiseptic oral rinse (BIOTENE) LIQD 15 mLs by Mouth Rinse route at bedtime. Swish for 30 seconds and spit out 6-9 pm  . bimatoprost (LUMIGAN) 0.01 % SOLN Place 1 drop into both eyes at bedtime.  . brimonidine (ALPHAGAN) 0.15 % ophthalmic solution Place 1 drop into both eyes 3 (three) times daily.  . cetirizine (ZYRTEC) 10 MG tablet Take 10 mg by mouth at bedtime.   . Cholecalciferol (VITAMIN D3)  50 MCG (2000 UT) CHEW Chew 1 capsule by mouth daily.  . eszopiclone (LUNESTA) 2 MG TABS tablet Take 1 tablet (2 mg total) by mouth at bedtime. Take immediately before bedtime  . lactose free nutrition (BOOST PLUS) LIQD Take 237 mLs by mouth daily. Vanilla  . LORazepam (ATIVAN) 0.5 MG tablet Take 0.5 mg by mouth as needed for anxiety.  Marland Kitchen losartan (COZAAR) 50 MG tablet TAKE 1 TABLET DAILY  . magnesium hydroxide (MILK OF MAGNESIA) 400 MG/5ML suspension Take by mouth daily as needed for mild constipation.  . nitrofurantoin (MACRODANTIN) 50 MG capsule Take 1 capsule (50 mg total) by mouth daily.  . ondansetron (ZOFRAN) 4 MG tablet Take 4 mg by mouth as needed for nausea or vomiting.  . polyethylene glycol (MIRALAX / GLYCOLAX) 17 g packet Take 17 g by mouth as directed. Every 4 days  . Propylene Glycol 0.6 % SOLN Apply 1 drop to eye daily as needed.  . rosuvastatin (CRESTOR) 5 MG tablet TAKE 1 TABLET AT BEDTIME  . sennosides-docusate sodium (SENOKOT-S) 8.6-50 MG tablet Take 2 tablets by mouth daily. For 2 days, then takes miralax and repeat  . [DISCONTINUED] LORazepam (ATIVAN) 0.5 MG tablet Take 1 tablet (0.5 mg total) by mouth every evening. Around suppertime  . [DISCONTINUED] ondansetron (ZOFRAN) 4 MG tablet Take 4 mg by mouth daily at 12 noon.   No facility-administered encounter medications on file as of 03/07/2021.    Review of Systems  Constitutional: Negative for activity change, appetite change, chills, diaphoresis, fatigue, fever and unexpected weight change.  HENT: Negative for congestion.   Respiratory: Negative for cough, shortness of breath and wheezing.   Cardiovascular: Negative for chest pain, palpitations and leg swelling.  Gastrointestinal: Positive for constipation. Negative for abdominal distention, abdominal pain and diarrhea.  Genitourinary: Negative for difficulty urinating and dysuria.       Urinary retention  Musculoskeletal: Positive for gait problem. Negative for  arthralgias, back pain, joint swelling and myalgias.  Neurological: Negative for dizziness, tremors, seizures, syncope, facial asymmetry, speech difficulty, weakness, light-headedness, numbness and headaches.  Psychiatric/Behavioral: Negative for agitation, behavioral problems and confusion.    Immunization History  Administered Date(s) Administered  . Influenza, High Dose Seasonal PF 09/26/2014, 08/17/2015, 08/19/2016, 08/20/2017, 08/24/2018, 09/23/2019, 10/05/2020  . Influenza-Unspecified 10/05/2020  . Moderna Sars-Covid-2 Vaccination 12/27/2019, 01/24/2020, 10/25/2020  . Pneumococcal Conjugate-13 09/07/2014  . Pneumococcal Polysaccharide-23 09/21/2007  . Tdap 05/23/2010  . Zoster 10/24/2005  . Zoster Recombinat (Shingrix) 09/22/2017, 11/25/2017   Pertinent  Health Maintenance Due  Topic Date Due  . INFLUENZA VACCINE  Completed  . DEXA SCAN  Completed  . PNA vac Low Risk Adult  Discontinued  Fall Risk  10/03/2020 07/25/2020 01/11/2020 12/15/2019 10/19/2019  Falls in the past year? 1 0 0 0 0  Number falls in past yr: 0 0 0 0 0  Injury with Fall? 1 0 0 0 0  Risk for fall due to : - - - - -  Follow up - - - - -   Functional Status Survey:    Vitals:   03/07/21 1501  BP: 125/78  Pulse: 95  Resp: 15  Temp: 97.9 F (36.6 C)  SpO2: 93%  Weight: 168 lb 12.8 oz (76.6 kg)  Height: 5\' 4"  (1.626 m)   Body mass index is 28.97 kg/m.  Wt Readings from Last 3 Encounters:  03/07/21 168 lb 12.8 oz (76.6 kg)  01/16/21 169 lb 9.6 oz (76.9 kg)  01/08/21 169 lb (76.7 kg)    Physical Exam Vitals and nursing note reviewed.  Constitutional:      General: She is not in acute distress.    Appearance: She is not diaphoretic.  HENT:     Head: Normocephalic and atraumatic.  Neck:     Vascular: No JVD.  Cardiovascular:     Rate and Rhythm: Normal rate and regular rhythm.     Heart sounds: No murmur heard.   Pulmonary:     Effort: Pulmonary effort is normal. No respiratory distress.      Breath sounds: Normal breath sounds. No wheezing.  Abdominal:     General: Bowel sounds are normal. There is no distension.     Palpations: Abdomen is soft.  Musculoskeletal:        General: No swelling, tenderness, deformity or signs of injury.     Cervical back: No rigidity or tenderness.     Comments: Trace edema to both ankles   Lymphadenopathy:     Cervical: No cervical adenopathy.  Skin:    General: Skin is warm and dry.  Neurological:     Mental Status: She is alert and oriented to person, place, and time.     Labs reviewed: Recent Labs    12/25/20 0000  NA 141  K 3.7  CL 103  CO2 26*  BUN 14  CREATININE 0.5  CALCIUM 8.7   Recent Labs    12/25/20 0000  AST 22  ALT 11  ALBUMIN 3.3*   Recent Labs    12/25/20 0000  WBC 3.1  HGB 12.3  HCT 37  PLT 202   No results found for: TSH No results found for: HGBA1C Lab Results  Component Value Date   CHOL 164 01/03/2020   HDL 64 01/03/2020   LDLCALC 76 01/03/2020   TRIG 116 01/03/2020   CHOLHDL 3 08/24/2018    Significant Diagnostic Results in last 30 days:  No results found.  Assessment/Plan  1. Chronic bilateral low back pain without sciatica With prior hx of lumbar compression fx.  Not currently having any issues Continue Tylenol 500 mg 2 tabs tid   2. Incomplete emptying of bladder Continues to self cath with no current issues.  Continue macrodantin per urology to prevent UTIs   3. Hyperlipidemia, unspecified hyperlipidemia type Needs lipid panel (told pt not to stop this med as the last time the LDL was checked it was at goal)  4. Hypertension, essential, benign Bp well controlled Continue losartan 50 mg qd (told pt we should not stop this med as her bp is at goal)  5. Anxiety Pt would like to try a dose reduction Will add ativan 0.25 mg qd  prn or 0.5 mg qd prn (either dose not both each day as needed)  I do not recommend self administration of meds for this issue   Family/ staff  Communication: discussed with the resident   Labs/tests ordered:  Lipid panel

## 2021-03-11 DIAGNOSIS — E785 Hyperlipidemia, unspecified: Secondary | ICD-10-CM | POA: Diagnosis not present

## 2021-03-11 LAB — LIPID PANEL
Cholesterol: 167 (ref 0–200)
HDL: 57 (ref 35–70)
LDL Cholesterol: 80
LDl/HDL Ratio: 2.9
Triglycerides: 154 (ref 40–160)

## 2021-03-20 ENCOUNTER — Encounter: Payer: Self-pay | Admitting: Internal Medicine

## 2021-03-20 ENCOUNTER — Non-Acute Institutional Stay: Payer: Medicare Other | Admitting: Internal Medicine

## 2021-03-20 ENCOUNTER — Other Ambulatory Visit: Payer: Self-pay

## 2021-03-20 VITALS — BP 120/64 | HR 72 | Temp 96.2°F | Ht 64.0 in | Wt 171.2 lb

## 2021-03-20 DIAGNOSIS — S32010S Wedge compression fracture of first lumbar vertebra, sequela: Secondary | ICD-10-CM | POA: Diagnosis not present

## 2021-03-20 DIAGNOSIS — I10 Essential (primary) hypertension: Secondary | ICD-10-CM | POA: Diagnosis not present

## 2021-03-20 DIAGNOSIS — M545 Low back pain, unspecified: Secondary | ICD-10-CM | POA: Diagnosis not present

## 2021-03-20 DIAGNOSIS — G8929 Other chronic pain: Secondary | ICD-10-CM

## 2021-03-20 DIAGNOSIS — R339 Retention of urine, unspecified: Secondary | ICD-10-CM

## 2021-03-20 DIAGNOSIS — R197 Diarrhea, unspecified: Secondary | ICD-10-CM

## 2021-03-20 DIAGNOSIS — E785 Hyperlipidemia, unspecified: Secondary | ICD-10-CM | POA: Diagnosis not present

## 2021-03-20 DIAGNOSIS — F419 Anxiety disorder, unspecified: Secondary | ICD-10-CM

## 2021-03-20 NOTE — Progress Notes (Signed)
Location: [facility] of Service:  Clinic (12)  Provider:   Code Status:  Goals of Care:  Advanced Directives 03/07/2021  Does Patient Have a Medical Advance Directive? Yes  Type of Paramedic of [place];Living will  Does patient want to make changes to medical advance directive? No - Patient declined  Copy of [place] in Chart? Yes - validated most recent copy scanned in chart (See row information)     Chief Complaint  Patient presents with  . Medical Management of Chronic Issues    Patient returns to the clinic for her follow up.    HPI: Patient is a 87 y.o. 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  Was not a candidate for kyphoplasty.  Was admitted in acute rehab for care and therapy Patient did eventually get better and is now in an enhanced assisted living. Her medications are mostly managed by nurses and patient is not very happy about that.  She continues to have issues with constipation followed by overflow incontinence. She thinks that Crestor and Cozaar can be causing this problem. She also is saying that she wants to taper herself off the Ativan and does not know why she will needed.  She was started on diet by Dr. Mariea Clonts for anxiety. Also wanted to stop Tylenol completely as she thinks that her fracture has healed and she does not need pain control. Walking with her new walker states she can walk without it but uses that for safety.  Did not have any other complaints  Past Medical History:  Diagnosis Date  . Allergy   . Anxiety   .  Arthritis   . Bursitis    right leg  . Family history of kidney cancer   . Family history of lung cancer   . Family history of uterine cancer   . GERD (gastroesophageal reflux disease)   . History of blood transfusion   . History of uterine cancer 01/19/2019  . Hyperlipidemia   . Hypertension   . Insomnia   . Non Hodgkin's lymphoma ([place]) 2017-2019   cured  . Osteopenia    MILD  . Urine retention   . Uterine cancer ([place])    at age 74    Past Surgical History:  Procedure Laterality Date  . ABDOMINAL HYSTERECTOMY     AGE 70  . APPENDECTOMY     AGE 67  . BLADDER SURGERY    . Catarct Surgery    . ECTOPIC PREGNANCY SURGERY     age 85  . LAPAROSCOPIC SMALL BOWEL RESECTION    . NM PET DX LYMPHOMA  10/2018   IN REMISSION  . occular pressure    . OTHER SURGICAL HISTORY    . REPLACEMENT TOTAL KNEE BILATERAL    . TONSILLECTOMY AND ADENOIDECTOMY     age 37 or 64  . UTERINE CANCER SURGERY     age 89- located in vagina    Allergies  Allergen Reactions  . Cefuroxime   . Clindamycin/Lincomycin   . Levofloxacin   . Lisinopril   . Prochlorperazine Edisylate   .  Psyllium   . Sulfa Antibiotics     Outpatient Encounter Medications as of 03/20/2021  Medication Sig  . Acetaminophen 500 MG capsule Take 500 mg by mouth 3 (three) times daily. 2 tabs  Three times daily  . antiseptic oral rinse (BIOTENE) LIQD 15 mLs by Mouth Rinse route at bedtime. Swish for 30 seconds and spit out 6-9 pm  . bimatoprost (LUMIGAN) 0.01 % SOLN Place 1 drop into both eyes at bedtime.  . brimonidine (ALPHAGAN) 0.15 % ophthalmic solution Place 1 drop into both eyes 3 (three) times daily.  . cetirizine (ZYRTEC) 10 MG tablet Take 10 mg by mouth at bedtime.   . Cholecalciferol (VITAMIN D3) 50 MCG (2000 UT) CHEW Chew 1 capsule by mouth daily.  . eszopiclone (LUNESTA) 2 MG TABS tablet Take 1 tablet (2 mg total) by mouth at bedtime. Take immediately before bedtime  . lactose free nutrition (BOOST PLUS) LIQD Take 237  mLs by mouth daily. Vanilla  . LORazepam (ATIVAN) 0.5 MG tablet Take 0.5 mg by mouth as needed for anxiety.  Marland Kitchen LORazepam (ATIVAN) 0.5 MG tablet Take 0.5 tablets (0.25 mg total) by mouth daily as needed for anxiety. Pt may take ativan 0.25 mg or ativan 0.5 mg qd prn one or the other not both  . losartan (COZAAR) 50 MG tablet TAKE 1 TABLET DAILY  . magnesium hydroxide (MILK OF MAGNESIA) 400 MG/5ML suspension Take by mouth daily as needed for mild constipation.  . nitrofurantoin (MACRODANTIN) 50 MG capsule Take 1 capsule (50 mg total) by mouth daily.  . ondansetron (ZOFRAN) 4 MG tablet Take 4 mg by mouth as needed for nausea or vomiting.  . polyethylene glycol (MIRALAX / GLYCOLAX) 17 g packet Take 17 g by mouth as directed. Every 4 days  . Propylene Glycol 0.6 % SOLN Apply 1 drop to eye daily as needed.  . rosuvastatin (CRESTOR) 5 MG tablet TAKE 1 TABLET AT BEDTIME  . sennosides-docusate sodium (SENOKOT-S) 8.6-50 MG tablet Take 2 tablets by mouth daily. For 2 days, then takes miralax and repeat   No facility-administered encounter medications on file as of 03/20/2021.    Review of Systems:  Review of Systems  Constitutional: Negative.   HENT: Negative.   Respiratory: Negative.   Cardiovascular: Negative.   Gastrointestinal: Positive for diarrhea.  Genitourinary: Negative.   Musculoskeletal: Positive for back pain.  Skin: Negative.   Neurological: Negative.   Psychiatric/Behavioral: Positive for dysphoric mood.    Health Maintenance  Topic Date Due  . COVID-19 Vaccine (4 - Booster for Moderna series) 04/24/2021  . INFLUENZA VACCINE  07/15/2021  . DEXA SCAN  Completed  . HPV VACCINES  Aged Out  . TETANUS/TDAP  Discontinued  . PNA vac Low Risk Adult  Discontinued    Physical Exam: Vitals:   03/20/21 0958  BP: 120/64  Pulse: 72  Temp: (!) 96.2 F (35.7 C)  SpO2: 97%  Weight: 171 lb 3.2 oz (77.7 kg)  Height: 5\' 4"  (1.626 m)   Body mass index is 29.39 kg/m. Physical Exam   Constitutional: Oriented to person, place, and time. Well-developed and well-nourished.  HENT:  Head: Normocephalic.  Mouth/Throat: Oropharynx is clear and moist.  Eyes: Pupils are equal, round, and reactive to light.  Neck: Neck supple.  Cardiovascular: Normal rate and normal heart sounds.  No murmur heard. Pulmonary/Chest: Effort normal and breath sounds normal. No respiratory distress. No wheezes. She has no rales.  Abdominal: Soft. Bowel sounds are normal. No distension. There is no  tenderness. There is no rebound. Has number of healed surgical incisions  Musculoskeletal: No edema.  Lymphadenopathy: none Neurological: Alert and oriented to person, place, and time.  Walks very well with her walker Skin: Skin is warm and dry.  Psychiatric: Normal mood and affect. Behavior is normal. Thought content normal.   Labs reviewed: Basic Metabolic Panel: Recent Labs    12/25/20 0000  NA 141  K 3.7  CL 103  CO2 26*  BUN 14  CREATININE 0.5  CALCIUM 8.7   Liver Function Tests: Recent Labs    12/25/20 0000  AST 22  ALT 11  ALBUMIN 3.3*   No results for input(s): LIPASE, AMYLASE in the last 8760 hours. No results for input(s): AMMONIA in the last 8760 hours. CBC: Recent Labs    12/25/20 0000  WBC 3.1  HGB 12.3  HCT 37  PLT 202   Lipid Panel: No results for input(s): CHOL, HDL, LDLCALC, TRIG, CHOLHDL, LDLDIRECT in the last 8760 hours. No results found for: HGBA1C  Procedures since last visit: No results found.  Assessment/Plan  Chronic bilateral low back pain without sciatica Wants to know if she can reduce her Tylenol from 6 tabs to less So we decided to do 5 tabs a day 2 bid and then 1 at night She can have one extra Prn  Incomplete emptying of bladder  Hypertension, essential, benign BP on lower side will decrease the Losartan to 25 mg She wants to stop it.  Will see how her BP is with reducing the dose Also withe her age and h/o Fall we can go for some  higher BP goals  Anxiety Wants her Ativan to be Prn So change Ativan to 0.25 mg PRN at 12 pm  And document why she needed it  Hyperlipidemia, unspecified hyperlipidemia type Does not want to take statin Seems like it was for primary Prevention Discussed the risk of Cardic and CVA Will monitor her Cholestrol off crestor Discontinue Crestor for now  Compression fracture of L1 vertebra, sequela Healed Pain controlled with Tylenol Walking well with her walker Diarrhea, unspecified type ? Etiology On Miralax and Senna It looks like she does not have Bowel movement for few days and then overflow diarrhea Will not change anything today. She wants to talk about her Bowel Regime in next visit   Labs/tests ordered:  * No order type specified * Next appt:  04/15/2021

## 2021-03-21 ENCOUNTER — Telehealth: Payer: Self-pay | Admitting: *Deleted

## 2021-03-21 ENCOUNTER — Encounter: Payer: Self-pay | Admitting: *Deleted

## 2021-03-21 NOTE — Telephone Encounter (Addendum)
She is right. So it should be 2 tabs in Am 2 in afternoon and then 1 at night and One extra at night PRN Can you pass that order to her Nurse ?

## 2021-03-21 NOTE — Telephone Encounter (Signed)
Called AL at [facility] and someone answered phone and stated that nursing was not available nor a medtech. She gave me the nursing number [phone] and I LMOM to return call. Awaiting callback.

## 2021-03-21 NOTE — Telephone Encounter (Signed)
Linda [name]. Linda [name] Female, 87 y.o., Aug 23, 1932   Message Received: Today Royal Hawthorn, NP  Jeri Lager, CMA  I was asked about this as well. I will take care of this and tell the nursing supervisor.

## 2021-03-21 NOTE — Telephone Encounter (Signed)
error 

## 2021-03-21 NOTE — Telephone Encounter (Signed)
Patient called and stated that she was seen yesterday by Dr. Lyndel Safe at [facility].  Stated that Dr. Lyndel Safe reduced her Tylenol ES to " Reduce by one tablet at 6pm"  But the AL received order to  "Reduce Tylenol to ONE tablet only at 6pm"  Patient is calling to have this changed. Stated that she needs Provider to let them know that she is to Reduce her Tylenol by ONE tablet at 6pm.  Please Advise.       Assessment/Plan  Chronic bilateral low back pain without sciatica Wants to know if she can reduce her Tylenol from 6 tabs to less So we decided to do 5 tabs a day 2 bid and then 1 at night She can have one extra Prn  Incomplete emptying of bladder

## 2021-04-15 ENCOUNTER — Other Ambulatory Visit: Payer: Self-pay

## 2021-04-15 ENCOUNTER — Encounter: Payer: Self-pay | Admitting: Adult Health

## 2021-04-15 ENCOUNTER — Non-Acute Institutional Stay: Payer: Medicare Other | Admitting: Adult Health

## 2021-04-15 VITALS — BP 128/70 | HR 80 | Temp 96.2°F | Ht 64.0 in | Wt 170.2 lb

## 2021-04-15 DIAGNOSIS — S32010D Wedge compression fracture of first lumbar vertebra, subsequent encounter for fracture with routine healing: Secondary | ICD-10-CM

## 2021-04-15 DIAGNOSIS — R339 Retention of urine, unspecified: Secondary | ICD-10-CM | POA: Diagnosis not present

## 2021-04-15 DIAGNOSIS — I10 Essential (primary) hypertension: Secondary | ICD-10-CM

## 2021-04-15 NOTE — Progress Notes (Signed)
Location: [facility]  Clinic  Provider:  Cindi Carbon, [place] [phone]   Code Status: Full code  Goals of Care:  Advanced Directives 03/07/2021  Does Patient Have a Medical Advance Directive? Yes  Type of Paramedic of [place];Living will  Does patient want to make changes to medical advance directive? No - Patient declined  Copy of [place] in Chart? Yes - validated most recent copy scanned in chart (See row information)     Chief Complaint  Patient presents with  . Medical Management of Chronic Issues    Patient returns to the clinic for her 4 week follow up. She would like to discuss her restrictions and medications in assisted living.     HPI: Patient is a 87 y.o. female seen today for medical management of chronic diseases.    BP has been controlled systolic 270-350 on lower dose cozaar  Reports her back has not caused her any trouble since the taper of tylenol. She has a hx of lumbar compression fracture but reports there is very minimal pain. She wants to continue to taper the tylenol.   She has a hx of urinary retention and her bladder was damaged 24 years ago by radiation for treatment of cancer and her urethra was closed up and she now uses I/O cath on a scheduled basis now.  No current urinary symptoms.  Past Medical History:  Diagnosis Date  . Allergy   . Anxiety   . Arthritis   . Bursitis    right leg  . Family history of kidney cancer   . Family history of lung cancer   . Family history of uterine cancer   . GERD (gastroesophageal reflux disease)   . History of blood transfusion   . History of uterine cancer 01/19/2019  . Hyperlipidemia   . Hypertension   . Insomnia   . Non Hodgkin's lymphoma ([place]) 2017-2019   cured  . Osteopenia    MILD  . Urine retention   . Uterine cancer ([place])    at age 56    Past Surgical History:  Procedure Laterality Date  .  ABDOMINAL HYSTERECTOMY     AGE 67  . APPENDECTOMY     AGE 63  . BLADDER SURGERY    . Catarct Surgery    . ECTOPIC PREGNANCY SURGERY     age 18  . LAPAROSCOPIC SMALL BOWEL RESECTION    . NM PET DX LYMPHOMA  10/2018   IN REMISSION  . occular pressure    . OTHER SURGICAL HISTORY    . REPLACEMENT TOTAL KNEE BILATERAL    . TONSILLECTOMY AND ADENOIDECTOMY     age 87 or 34  . UTERINE CANCER SURGERY     age 54- located in vagina    Allergies  Allergen Reactions  . Cefuroxime   . Clindamycin/Lincomycin   . Levofloxacin   . Lisinopril   . Prochlorperazine Edisylate   . Psyllium   . Sulfa Antibiotics     Outpatient Encounter Medications as of 04/15/2021  Medication Sig  . Acetaminophen 500 MG capsule Take 500 mg by mouth daily.  . bimatoprost (LUMIGAN) 0.01 % SOLN Place 1 drop into both eyes at bedtime.  . brimonidine (ALPHAGAN) 0.15 % ophthalmic solution Place 1 drop into both eyes 3 (three) times daily.  . cetirizine (ZYRTEC) 10 MG tablet Take 10 mg by mouth at bedtime.   . eszopiclone (LUNESTA) 2 MG TABS tablet  Take 1 tablet (2 mg total) by mouth at bedtime. Take immediately before bedtime  . LORazepam (ATIVAN) 0.5 MG tablet Take 0.25 mg by mouth every other day.  . nitrofurantoin (MACRODANTIN) 50 MG capsule Take 1 capsule (50 mg total) by mouth daily.  . polyethylene glycol (MIRALAX / GLYCOLAX) 17 g packet Take 17 g by mouth as directed. Every 4 days  . Propylene Glycol 0.6 % SOLN Apply 1 drop to eye 3 (three) times daily.  . sennosides-docusate sodium (SENOKOT-S) 8.6-50 MG tablet Take 2 tablets by mouth daily. For 3 days, then takes miralax and repeat  . [DISCONTINUED] antiseptic oral rinse (BIOTENE) LIQD 15 mLs by Mouth Rinse route at bedtime. Swish for 30 seconds and spit out 6-9 pm  . [DISCONTINUED] Cholecalciferol (VITAMIN D3) 50 MCG (2000 UT) CHEW Chew 1 capsule by mouth daily.  . [DISCONTINUED] lactose free nutrition (BOOST PLUS) LIQD Take 237 mLs by mouth daily. Vanilla  .  [DISCONTINUED] LORazepam (ATIVAN) 0.5 MG tablet Take 0.25 mg by mouth as needed for anxiety.  . [DISCONTINUED] losartan (COZAAR) 50 MG tablet TAKE 1 TABLET DAILY (Patient taking differently: 25 mg.)  . [DISCONTINUED] magnesium hydroxide (MILK OF MAGNESIA) 400 MG/5ML suspension Take by mouth daily as needed for mild constipation.  . [DISCONTINUED] ondansetron (ZOFRAN) 4 MG tablet Take 4 mg by mouth as needed for nausea or vomiting.   No facility-administered encounter medications on file as of 04/15/2021.    Review of Systems:  Review of Systems  Constitutional: Negative for activity change, appetite change, chills, diaphoresis, fatigue, fever and unexpected weight change.  HENT: Negative for congestion.   Respiratory: Negative for cough, shortness of breath and wheezing.   Cardiovascular: Negative for chest pain, palpitations and leg swelling.  Gastrointestinal: Negative for abdominal distention, abdominal pain, constipation and diarrhea.  Genitourinary: Negative for difficulty urinating and dysuria.  Musculoskeletal: Positive for gait problem. Negative for arthralgias, back pain, joint swelling and myalgias.  Neurological: Negative for dizziness, tremors, seizures, syncope, facial asymmetry, speech difficulty, weakness, light-headedness, numbness and headaches.  Psychiatric/Behavioral: Negative for agitation, behavioral problems and confusion. The patient is nervous/anxious.     Health Maintenance  Topic Date Due  . COVID-19 Vaccine (4 - Booster for Moderna series) 04/24/2021  . INFLUENZA VACCINE  07/15/2021  . DEXA SCAN  Completed  . HPV VACCINES  Aged Out  . TETANUS/TDAP  Discontinued  . PNA vac Low Risk Adult  Discontinued    Physical Exam: Vitals:   04/15/21 1327  BP: 128/70  Pulse: 80  Temp: (!) 96.2 F (35.7 C)  SpO2: 98%  Weight: 170 lb 3.2 oz (77.2 kg)  Height: 5\' 4"  (1.626 m)   Body mass index is 29.21 kg/m. Physical Exam Vitals and nursing note reviewed.   Constitutional:      General: She is not in acute distress.    Appearance: She is not diaphoretic.  HENT:     Head: Normocephalic and atraumatic.  Neck:     Vascular: No JVD.  Cardiovascular:     Rate and Rhythm: Normal rate and regular rhythm.     Heart sounds: No murmur heard.   Pulmonary:     Effort: Pulmonary effort is normal. No respiratory distress.     Breath sounds: Normal breath sounds. No wheezing.  Skin:    General: Skin is warm and dry.  Neurological:     Mental Status: She is alert and oriented to person, place, and time.     Labs reviewed: Basic Metabolic  Panel: Recent Labs    12/25/20 0000  NA 141  K 3.7  CL 103  CO2 26*  BUN 14  CREATININE 0.5  CALCIUM 8.7   Liver Function Tests: Recent Labs    12/25/20 0000  AST 22  ALT 11  ALBUMIN 3.3*   No results for input(s): LIPASE, AMYLASE in the last 8760 hours. No results for input(s): AMMONIA in the last 8760 hours. CBC: Recent Labs    12/25/20 0000  WBC 3.1  HGB 12.3  HCT 37  PLT 202   Lipid Panel: Recent Labs    03/11/21 0000  CHOL 167  HDL 57  LDLCALC 80  TRIG 154   No results found for: HGBA1C  Procedures since last visit: No results found.  Assessment/Plan 1. Hypertension, essential, benign D/C Cozaar  Check BP qd in AL manually x 1 week and report to psc  2. Compression fracture of L1 vertebra with routine healing, subsequent encounter Continues to improve Reduce tylenol to 1 gram qam and continue prn dosing.   3. Incomplete bladder emptying Continues to self cath q 4 hrs and wants to be sure this is noted in her chart   Wants to be able to have carpet in her room and have any shower restrictions removed which were written during an acute illness. I have asked the staff at [facility] to address these issues.   Wants to discontinue VIt D, Zofran, boost, and prn ativan.   Labs/tests ordered:  CBC BMP prior to next apt Next appt:  F/u with Dr. Lyndel Safe in 4 months     Total time 30 min: time greater than 50% of total time spent doing pt counseling and coordination of care

## 2021-04-19 ENCOUNTER — Other Ambulatory Visit: Payer: Self-pay | Admitting: Adult Health

## 2021-04-19 DIAGNOSIS — G4700 Insomnia, unspecified: Secondary | ICD-10-CM

## 2021-04-19 MED ORDER — ESZOPICLONE 2 MG PO TABS: 2.0000 mg | ORAL_TABLET | Freq: Every day | ORAL | 5 refills | Status: DC

## 2021-04-22 ENCOUNTER — Telehealth: Payer: Self-pay | Admitting: *Deleted

## 2021-04-22 MED ORDER — SENNA-DOCUSATE SODIUM 8.6-50 MG PO TABS: 2.0000 | ORAL_TABLET | Freq: Every day | ORAL | 0 refills | Status: DC | PRN

## 2021-04-22 MED ORDER — ACETAMINOPHEN 500 MG PO CAPS
ORAL_CAPSULE | ORAL | 0 refills | Status: DC
Start: 2021-04-22 — End: 2023-06-16

## 2021-04-22 NOTE — Telephone Encounter (Signed)
Patient called and stated that the Tylenol reduction worked. Stated that her bowel Movements are stable since decreasing the Tylenol to 2 in the morning and 1 in the evening.   Patient is wanting to know if it can be reduced to One in the morning and One in the evening PRN.  Also wanting the Senna and Miralax to be changed to PRN.   Patient wants to determine when she wants/Needs to take the medication.   Please Advise.

## 2021-04-22 NOTE — Telephone Encounter (Signed)
Patient notified and agreed. Stated she will have her nurse contact [place].

## 2021-04-22 NOTE — Addendum Note (Signed)
Addended by: Rafael Bihari A on: 04/22/2021 04:04 PM   Modules accepted: Orders

## 2021-04-22 NOTE — Telephone Encounter (Signed)
Yes she can reduce the tylenol to 1 in the morning and 1 in the evening prn.  Yes its ok to change the senna and miralax to qd prn constipation. Since she is in AL she should have the nurse contact me for further questions so that we keep them in the loop.  Thanks

## 2021-04-23 DIAGNOSIS — T1511XA Foreign body in conjunctival sac, right eye, initial encounter: Secondary | ICD-10-CM | POA: Diagnosis not present

## 2021-04-25 ENCOUNTER — Telehealth: Payer: Self-pay | Admitting: Internal Medicine

## 2021-04-25 NOTE — Telephone Encounter (Signed)
Linda [name] called this morning & she is interested in a referral for Pain Reprocessing Therapy for her back.  I researched that a few MD/facilities her in [place] provided that service if you agree for her to attend:  [facility] [place]  Please advise, Linda [name]

## 2021-04-26 NOTE — Telephone Encounter (Signed)
Lets wait Linda [name]. She is in AL in [place] and it seems Alyse Low just made some changes to her Meds. Will wait and see if she is better before making the referal. Thanks for the Info.

## 2021-05-06 ENCOUNTER — Telehealth: Payer: Self-pay | Admitting: *Deleted

## 2021-05-06 NOTE — Telephone Encounter (Signed)
Patient notified and agreed.  

## 2021-05-06 NOTE — Telephone Encounter (Signed)
Patient called and stated that she is wanting to get the Covid Booster. Stated that her last injection was 10/2020 and she is wanting the ok to get Booster.  Stated that she is a resident of [facility] and they will give it to her there but she needs the approval from Dr. Lyndel Safe first.   Please Advise.

## 2021-05-06 NOTE — Telephone Encounter (Signed)
Patient PCP was listed as Dr. Rudie Meyer on 04/26/21)  I did confirm with patient that Dr. Lyndel Safe is PCP before changing to Dr. Lyndel Safe and patient agreed.

## 2021-05-06 NOTE — Telephone Encounter (Signed)
I have placed the Order.

## 2021-06-10 ENCOUNTER — Encounter: Payer: Medicare Other | Admitting: Adult Health

## 2021-06-14 DIAGNOSIS — H524 Presbyopia: Secondary | ICD-10-CM | POA: Diagnosis not present

## 2021-06-14 DIAGNOSIS — H401131 Primary open-angle glaucoma, bilateral, mild stage: Secondary | ICD-10-CM | POA: Diagnosis not present

## 2021-07-11 DIAGNOSIS — N312 Flaccid neuropathic bladder, not elsewhere classified: Secondary | ICD-10-CM | POA: Diagnosis not present

## 2021-07-22 ENCOUNTER — Other Ambulatory Visit: Payer: Self-pay

## 2021-07-22 ENCOUNTER — Encounter: Payer: Self-pay | Admitting: Adult Health

## 2021-07-22 ENCOUNTER — Non-Acute Institutional Stay: Payer: Medicare Other | Admitting: Adult Health

## 2021-07-22 VITALS — BP 126/74 | HR 60 | Temp 97.1°F | Ht 64.0 in | Wt 166.0 lb

## 2021-07-22 DIAGNOSIS — D692 Other nonthrombocytopenic purpura: Secondary | ICD-10-CM | POA: Diagnosis not present

## 2021-07-22 DIAGNOSIS — R339 Retention of urine, unspecified: Secondary | ICD-10-CM

## 2021-07-22 DIAGNOSIS — F5101 Primary insomnia: Secondary | ICD-10-CM | POA: Diagnosis not present

## 2021-07-22 DIAGNOSIS — R2681 Unsteadiness on feet: Secondary | ICD-10-CM

## 2021-07-22 NOTE — Progress Notes (Signed)
[facility]  POS: clinic  Provider:  Cindi Carbon, [place] [phone]   Goals of Care:  Advanced Directives 03/07/2021  Does Patient Have a Medical Advance Directive? Yes  Type of Paramedic of [place];Living will  Does patient want to make changes to medical advance directive? No - Patient declined  Copy of [place] in Chart? Yes - validated most recent copy scanned in chart (See row information)     Chief Complaint  Patient presents with   Medical Management of Chronic Issues    Patient returns to the clinic to discuss her exercise and walker usage.    HPI: Patient is a 87 y.o. female seen today to request a PT referral. Last year she had a fall with a compression fracture and severe back pain, and required a rehab stay and transferred to [place]. She has been using a walker since that time for stability  but feels she is ready to progress off of it and would like to see PT. She is not having any back pain and has not had any falls.  She just went to urology for a check up and continues to self catheterize due to prior hx of vaginal vault ca with surgery. No urinary symptoms are noted. She also reports small bruises of unknown origin to her arms. She want to come off Lunesta and try something natural instead.   Past Medical History:  Diagnosis Date   Allergy    Anxiety    Arthritis    Bursitis    right leg   Family history of kidney cancer    Family history of lung cancer    Family history of uterine cancer    GERD (gastroesophageal reflux disease)    History of blood transfusion    History of uterine cancer 01/19/2019   Hyperlipidemia    Hypertension    Insomnia    Non Hodgkin's lymphoma ([place]) 2017-2019   cured   Osteopenia    MILD   Urine retention    Uterine cancer ([place])    at age 63    Past Surgical History:  Procedure Laterality Date   ABDOMINAL HYSTERECTOMY     AGE 35    APPENDECTOMY     AGE 67   BLADDER SURGERY     Catarct Surgery     ECTOPIC PREGNANCY SURGERY     age 38   LAPAROSCOPIC SMALL BOWEL RESECTION     NM PET DX LYMPHOMA  10/2018   IN REMISSION   occular pressure     OTHER SURGICAL HISTORY     REPLACEMENT TOTAL KNEE BILATERAL     TONSILLECTOMY AND ADENOIDECTOMY     age 38 or 89   UTERINE CANCER SURGERY     age 89- located in vagina    Allergies  Allergen Reactions   Cefuroxime    Clindamycin/Lincomycin    Levofloxacin    Lisinopril    Prochlorperazine Edisylate    Psyllium    Sulfa Antibiotics     Outpatient Encounter Medications as of 07/22/2021  Medication Sig   Acetaminophen 500 MG capsule Take one tablet by mouth in the morning as needed and Take one tablet by mouth in the evening as needed.   bimatoprost (LUMIGAN) 0.01 % SOLN Place 1 drop into both eyes at bedtime.   brimonidine (ALPHAGAN) 0.15 % ophthalmic solution Place 1 drop into both eyes 3 (three) times daily.   cetirizine (ZYRTEC) 10  MG tablet Take 10 mg by mouth at bedtime.    eszopiclone (LUNESTA) 2 MG TABS tablet Take 1 tablet (2 mg total) by mouth at bedtime. Take immediately before bedtime   nitrofurantoin (MACRODANTIN) 50 MG capsule Take 1 capsule (50 mg total) by mouth daily.   polyethylene glycol (MIRALAX / GLYCOLAX) 17 g packet Take 17 g by mouth as directed. Every 4 days   sennosides-docusate sodium (SENOKOT-S) 8.6-50 MG tablet Take 2 tablets by mouth daily as needed for constipation. For 3 days, then takes miralax and repeat   [DISCONTINUED] Propylene Glycol 0.6 % SOLN Apply 1 drop to eye 3 (three) times daily.   [DISCONTINUED] LORazepam (ATIVAN) 0.5 MG tablet Take 0.25 mg by mouth every other day.   No facility-administered encounter medications on file as of 07/22/2021.    Review of Systems:  Review of Systems  Constitutional:  Negative for activity change, appetite change, chills, diaphoresis, fatigue, fever and unexpected weight change.  HENT:  Negative  for congestion.   Respiratory:  Negative for cough, shortness of breath and wheezing.   Cardiovascular:  Negative for chest pain, palpitations and leg swelling.  Gastrointestinal:  Negative for abdominal distention, abdominal pain, constipation and diarrhea.  Genitourinary:  Negative for difficulty urinating and dysuria.  Musculoskeletal:  Positive for gait problem. Negative for arthralgias, back pain, joint swelling and myalgias.  Skin:  Positive for color change.  Neurological:  Negative for dizziness, tremors, seizures, syncope, facial asymmetry, speech difficulty, weakness, light-headedness, numbness and headaches.  Psychiatric/Behavioral:  Positive for sleep disturbance (resolved with lunesta). Negative for agitation, behavioral problems and confusion.        Short term memory loss   Health Maintenance  Topic Date Due   COVID-19 Vaccine (4 - Booster for Moderna series) 01/25/2021   INFLUENZA VACCINE  07/15/2021   DEXA SCAN  Completed   Zoster Vaccines- Shingrix  Completed   HPV VACCINES  Aged Out   TETANUS/TDAP  Discontinued   PNA vac Low Risk Adult  Discontinued    Physical Exam: Vitals:   07/22/21 1442  BP: 126/74  Pulse: 60  Temp: (!) 97.1 F (36.2 C)  SpO2: 97%  Weight: 166 lb (75.3 kg)  Height: '5\' 4"'$  (1.626 m)   Body mass index is 28.49 kg/m. Physical Exam Vitals and nursing note reviewed.  Constitutional:      General: She is not in acute distress.    Appearance: She is not diaphoretic.  HENT:     Head: Normocephalic and atraumatic.  Neck:     Vascular: No JVD.  Cardiovascular:     Rate and Rhythm: Normal rate and regular rhythm.     Heart sounds: No murmur heard. Pulmonary:     Effort: Pulmonary effort is normal. No respiratory distress.     Breath sounds: Normal breath sounds. No wheezing.  Skin:    General: Skin is warm and dry.     Findings: Bruising (small areas of ecchymosis to BUE) present.  Neurological:     Mental Status: She is alert and  oriented to person, place, and time.    Labs reviewed: Basic Metabolic Panel: Recent Labs    12/25/20 0000  NA 141  K 3.7  CL 103  CO2 26*  BUN 14  CREATININE 0.5  CALCIUM 8.7   Liver Function Tests: Recent Labs    12/25/20 0000  AST 22  ALT 11  ALBUMIN 3.3*   No results for input(s): LIPASE, AMYLASE in the last 8760 hours. No  results for input(s): AMMONIA in the last 8760 hours. CBC: Recent Labs    12/25/20 0000  WBC 3.1  HGB 12.3  HCT 37  PLT 202   Lipid Panel: Recent Labs    03/11/21 0000  CHOL 167  HDL 57  LDLCALC 80  TRIG 154   No results found for: HGBA1C  Procedures since last visit: No results found.  Assessment/Plan  1. Gait instability Due to prior back pain/weakness Would like to work with therapy and try to stop using her walker PT eval and tx ordered   2. Incomplete emptying of bladder Due to prior surgery I and O caths qshift and followed by urology   3. Senile purpura (HCC) Mild Noted to both arms CBC ordered prior to next visit   4. Primary insomnia She is interested in tapering off Lunesta  Try melatonin 5 mg qhs (she will buy her own supply) Once she begins melatonin  after 3 days she can stop the [place].   Total time 1mn:  time greater than 50% of total time spent doing pt counseling and coordination of care     Labs/tests ordered: CBC and BMP prior to next apt Next appt:  08/14/2021

## 2021-07-25 DIAGNOSIS — S32010D Wedge compression fracture of first lumbar vertebra, subsequent encounter for fracture with routine healing: Secondary | ICD-10-CM | POA: Diagnosis not present

## 2021-07-25 DIAGNOSIS — R2681 Unsteadiness on feet: Secondary | ICD-10-CM | POA: Diagnosis not present

## 2021-07-25 DIAGNOSIS — R2689 Other abnormalities of gait and mobility: Secondary | ICD-10-CM | POA: Diagnosis not present

## 2021-07-25 DIAGNOSIS — M5459 Other low back pain: Secondary | ICD-10-CM | POA: Diagnosis not present

## 2021-07-30 ENCOUNTER — Ambulatory Visit (INDEPENDENT_AMBULATORY_CARE_PROVIDER_SITE_OTHER): Payer: Medicare Other | Admitting: Orthopaedic Surgery

## 2021-07-30 ENCOUNTER — Other Ambulatory Visit: Payer: Self-pay

## 2021-07-30 ENCOUNTER — Encounter: Payer: Self-pay | Admitting: Orthopaedic Surgery

## 2021-07-30 VITALS — BP 144/77 | HR 63 | Ht 64.0 in | Wt 170.6 lb

## 2021-07-30 DIAGNOSIS — S32010D Wedge compression fracture of first lumbar vertebra, subsequent encounter for fracture with routine healing: Secondary | ICD-10-CM

## 2021-08-01 NOTE — Progress Notes (Signed)
Office Visit Note   Patient: Linda [name] [id number]           Date of Birth: [birth year]           MRN: [id number] Visit Date: 07/30/2021              Requested by: Virgie Dad, MD [address] [place],  [place] [id number] PCP: Virgie Dad, MD   Assessment & Plan: Visit Diagnoses:  1. Compression fracture of L1 vertebra with routine healing, subsequent encounter     Plan: Patient can transition from her walker to using a cane.  Continue ambulation for bone density health, cardiac health.  Note written that she can continue to ambulate does not any formal physical therapy at this point and can continue to participate in the exercise classes at [facility] she enjoys.  We reviewed previous 01/08/2021 images and images prior to that.  Follow-Up Instructions: No follow-ups on file.   Orders:  No orders of the defined types were placed in this encounter.  No orders of the defined types were placed in this encounter.     Procedures: No procedures performed   Clinical Data: No additional findings.   Subjective: Chief Complaint  Patient presents with   Lower Back - Follow-up    HPI 87 year old female follow-up with L1 compression fracture last year.  States she has no pain is ambulatory with a walker.  She spent a long time in rehab and now is in assisted living.  She has been doing some weight lifting activities 4 times per week occasionally using Tylenol if needed.  She is not on any stronger medicine.  She does pool exercises.  She is requesting repeat x-ray just to check how things look.  Review of Systems all the systems are noncontributory to HPI.   Objective: Vital Signs: BP (!) 144/77 (BP Location: Left Arm, Patient Position: Sitting, Cuff Size: Large) Comment: patient states that she is currently not taking any BP medications  Pulse 63   Ht '5\' 4"'$  (1.626 m)   Wt 170 lb 9.6 oz (77.4 kg)   SpO2 96%   BMI 29.28 kg/m   Physical Exam Constitutional:       Appearance: She is well-developed.  HENT:     Head: Normocephalic.     Right Ear: External ear normal.     Left Ear: External ear normal. There is no impacted cerumen.  Eyes:     Pupils: Pupils are equal, round, and reactive to light.  Neck:     Thyroid: No thyromegaly.     Trachea: No tracheal deviation.  Cardiovascular:     Rate and Rhythm: Normal rate.  Pulmonary:     Effort: Pulmonary effort is normal.  Abdominal:     Palpations: Abdomen is soft.  Musculoskeletal:     Cervical back: No rigidity.  Skin:    General: Skin is warm and dry.  Neurological:     Mental Status: She is alert and oriented to person, place, and time.  Psychiatric:        Behavior: Behavior normal.    Ortho Exam patient has intact lower extremity reflexes negative logroll of the hips.  She can ambulate without her walker.  Quads are strong.  Specialty Comments:  No specialty comments available.  Imaging: No results found.   PMFS History: Patient Active Problem List   Diagnosis Date Noted   Lumbar compression fracture ([place]) 11/13/2020   Rash 12/15/2019   Venous insufficiency of  both lower extremities 07/15/2019   Environmental allergies 02/23/2019   Osteoporosis with pathological fracture 02/23/2019   Senile purpura ([place]) 02/23/2019   History of uterine cancer 01/19/2019   Family history of uterine cancer    Family history of kidney cancer    Family history of lung cancer    Fracture of right acetabulum ([place]) 01/11/2019   Chronic constipation with overflow incontinence 12/31/2018   Physical deconditioning 12/31/2018   Spinal stenosis of lumbar region without neurogenic claudication 12/03/2018   Lumbar foraminal stenosis 12/03/2018   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 10/22/2018   Seasonal allergies 10/20/2018   Trochanteric bursitis, right hip 09/14/2018   Chronic bilateral low back pain 09/14/2018   Hoarseness of voice 04/08/2018   Hypertension, essential, benign 01/16/2018   Sensorineural  hearing loss (SNHL) of both ears 01/16/2018   Insomnia 01/16/2018   H/O Cancer of vaginal vault ([place]) 01/15/2018   Osteopenia of both hips 01/15/2018   Hyperlipidemia 01/15/2018   DJD (degenerative joint disease) of knee 02/13/2011   H/O difficult intubation 02/12/2011   Dry eye syndrome 04/10/2010   Incomplete emptying of bladder 08/31/2009   Borderline glaucoma with ocular hypertension 09/07/2008   Past Medical History:  Diagnosis Date   Allergy    Anxiety    Arthritis    Bursitis    right leg   Family history of kidney cancer    Family history of lung cancer    Family history of uterine cancer    GERD (gastroesophageal reflux disease)    History of blood transfusion    History of uterine cancer 01/19/2019   Hyperlipidemia    Hypertension    Insomnia    Non Hodgkin's lymphoma ([place]) 2017-2019   cured   Osteopenia    MILD   Urine retention    Uterine cancer ([place])    at age 48    Family History  Problem Relation Age of Onset   Uterine cancer Sister 59       uterine cancer   COPD Maternal Grandfather    Lung cancer Maternal Grandfather    Heart failure Mother 27   Pneumonia Father 18   Dementia Father    Uterine cancer Maternal Grandmother 70   Other Sister        Guillain Barre syndrom   Kidney cancer Son 76   Dementia Paternal Grandmother    Lung cancer Maternal Uncle    Other Son        Familial Hypercholesterolemia   Colon cancer Neg Hx    Esophageal cancer Neg Hx    Liver cancer Neg Hx    Pancreatic cancer Neg Hx    Rectal cancer Neg Hx    Stomach cancer Neg Hx     Past Surgical History:  Procedure Laterality Date   ABDOMINAL HYSTERECTOMY     AGE 53   APPENDECTOMY     AGE 87   BLADDER SURGERY     Catarct Surgery     ECTOPIC PREGNANCY SURGERY     age 59   LAPAROSCOPIC SMALL BOWEL RESECTION     NM PET DX LYMPHOMA  10/2018   IN REMISSION   occular pressure     OTHER SURGICAL HISTORY     REPLACEMENT TOTAL KNEE BILATERAL     TONSILLECTOMY AND  ADENOIDECTOMY     age 52 or 36   UTERINE CANCER SURGERY     age 7- located in vagina   Social History   Occupational History   Occupation:  retired  Tobacco Use   Smoking status: Never   Smokeless tobacco: Never  Vaping Use   Vaping Use: Never used  Substance and Sexual Activity   Alcohol use: Yes    Alcohol/week: 7.0 standard drinks    Types: 7 Glasses of wine per week    Comment: one glass of wine with dinner   Drug use: No   Sexual activity: Never

## 2021-08-12 DIAGNOSIS — I10 Essential (primary) hypertension: Secondary | ICD-10-CM | POA: Diagnosis not present

## 2021-08-12 LAB — CBC AND DIFFERENTIAL
HCT: 36 (ref 36–46)
Hemoglobin: 11.9 — AB (ref 12.0–16.0)
Platelets: 160 (ref 150–399)
WBC: 3.7

## 2021-08-12 LAB — CBC: RBC: 4.03 (ref 3.87–5.11)

## 2021-08-12 LAB — BASIC METABOLIC PANEL
BUN: 15 (ref 4–21)
CO2: 27 — AB (ref 13–22)
Chloride: 107 (ref 99–108)
Creatinine: 0.6 (ref 0.5–1.1)
Glucose: 67
Potassium: 4.4 (ref 3.4–5.3)
Sodium: 144 (ref 137–147)

## 2021-08-12 LAB — COMPREHENSIVE METABOLIC PANEL: Calcium: 9 (ref 8.7–10.7)

## 2021-08-14 ENCOUNTER — Encounter: Payer: Self-pay | Admitting: Internal Medicine

## 2021-08-14 ENCOUNTER — Other Ambulatory Visit: Payer: Self-pay

## 2021-08-14 ENCOUNTER — Non-Acute Institutional Stay: Payer: Medicare Other | Admitting: Internal Medicine

## 2021-08-14 VITALS — BP 132/94 | HR 71 | Temp 96.4°F | Ht 64.0 in | Wt 169.6 lb

## 2021-08-14 DIAGNOSIS — I10 Essential (primary) hypertension: Secondary | ICD-10-CM

## 2021-08-14 DIAGNOSIS — D692 Other nonthrombocytopenic purpura: Secondary | ICD-10-CM

## 2021-08-14 DIAGNOSIS — S32010S Wedge compression fracture of first lumbar vertebra, sequela: Secondary | ICD-10-CM

## 2021-08-14 DIAGNOSIS — N319 Neuromuscular dysfunction of bladder, unspecified: Secondary | ICD-10-CM | POA: Diagnosis not present

## 2021-08-14 DIAGNOSIS — G8929 Other chronic pain: Secondary | ICD-10-CM | POA: Diagnosis not present

## 2021-08-14 DIAGNOSIS — E785 Hyperlipidemia, unspecified: Secondary | ICD-10-CM | POA: Diagnosis not present

## 2021-08-14 DIAGNOSIS — M545 Low back pain, unspecified: Secondary | ICD-10-CM

## 2021-08-14 DIAGNOSIS — G4700 Insomnia, unspecified: Secondary | ICD-10-CM

## 2021-08-14 NOTE — Progress Notes (Signed)
Location:  [place] of Service:  Clinic (12)  Provider:   Code Status: DNR Goals of Care:  Advanced Directives 08/14/2021  Does Patient Have a Medical Advance Directive? Yes  Type of Paramedic of [place];Living will  Does patient want to make changes to medical advance directive? No - Patient declined  Copy of [place] in Chart? Yes - validated most recent copy scanned in chart (See row information)     Chief Complaint  Patient presents with   Medical Management of Chronic Issues    Patient returns to the clinic for 4 month follow up.   Quality Metric Gaps    Flu Shot    HPI: Patient is a 87 y.o. female seen today for medical management of chronic diseases.    Patient has a history of MALT lymphoma PET in 12/2018 was negative for Any recurrence History of cancer of vaginal vault s/p adjuvant radiation therapy over 20 years ago Uses In and Out cath for urination History of chronic constipation with overflow incontinence   Had a fall in 10/21 and sustained L1 compression fracture.  Was not a candidate for kyphoplasty.  Was admitted in acute rehab for care and therapy Patient did eventually get better and is now in an enhanced assisted living.  Follows with Urology for Neuropathic Bladder Continues In And Out Cath  L1 Fracture Seen by Dr Lorin Mercy. Not using Cane or Walker Gait still feels little Unstable But she thinks it is better this way Pain is controlled  Continuous Exercise Class Also wants her Tylenol reduced to twice a day  Rash in her Arms ? Senile Purpura. Is going to see dermatology  Insomnia Tried Melatonin to see if she can taper Lunesta.  But patient states that melatonin did not work for her and she wants to make sure she can continue her [place].  Past Medical History:  Diagnosis Date   Allergy    Anxiety    Arthritis    Bursitis    right leg   Family history of kidney  cancer    Family history of lung cancer    Family history of uterine cancer    GERD (gastroesophageal reflux disease)    History of blood transfusion    History of uterine cancer 01/19/2019   Hyperlipidemia    Hypertension    Insomnia    Non Hodgkin's lymphoma ([place]) 2017-2019   cured   Osteopenia    MILD   Urine retention    Uterine cancer ([place])    at age 79    Past Surgical History:  Procedure Laterality Date   ABDOMINAL HYSTERECTOMY     AGE 55   APPENDECTOMY     AGE 85   BLADDER SURGERY     Catarct Surgery     ECTOPIC PREGNANCY SURGERY     age 67   LAPAROSCOPIC SMALL BOWEL RESECTION     NM PET DX LYMPHOMA  10/2018   IN REMISSION   occular pressure     OTHER SURGICAL HISTORY     REPLACEMENT TOTAL KNEE BILATERAL     TONSILLECTOMY AND ADENOIDECTOMY     age 80 or 34   UTERINE CANCER SURGERY     age 89- located in vagina    Allergies  Allergen Reactions   Cefuroxime    Clindamycin/Lincomycin    Levofloxacin    Lisinopril    Prochlorperazine Edisylate    Psyllium  Sulfa Antibiotics     Outpatient Encounter Medications as of 08/14/2021  Medication Sig   Acetaminophen 500 MG capsule Take one tablet by mouth in the morning as needed and Take one tablet by mouth in the evening as needed.   antiseptic oral rinse (BIOTENE) LIQD 15 mLs by Mouth Rinse route at bedtime.   bimatoprost (LUMIGAN) 0.01 % SOLN Place 1 drop into both eyes at bedtime.   brimonidine (ALPHAGAN) 0.15 % ophthalmic solution Place 1 drop into both eyes 3 (three) times daily.   cetirizine (ZYRTEC) 10 MG tablet Take 10 mg by mouth at bedtime.    eszopiclone (LUNESTA) 2 MG TABS tablet Take 1 tablet (2 mg total) by mouth at bedtime. Take immediately before bedtime   magnesium hydroxide (MILK OF MAGNESIA) 400 MG/5ML suspension Take by mouth daily as needed for mild constipation.   melatonin 5 MG TABS Take 5 mg by mouth at bedtime.   nitrofurantoin (MACRODANTIN) 50 MG capsule Take 1 capsule (50 mg total) by  mouth daily.   Polyethyl Glycol-Propyl Glycol (SYSTANE) 0.4-0.3 % GEL ophthalmic gel Place 1 application into both eyes at bedtime.   polyethylene glycol (MIRALAX / GLYCOLAX) 17 g packet Take 17 g by mouth as directed. Every 4 days   Propylene Glycol (LUBRICANT EYE DROPS) 0.6 % SOLN Apply to eye as needed.   sennosides-docusate sodium (SENOKOT-S) 8.6-50 MG tablet Take 2 tablets by mouth daily as needed for constipation. For 3 days, then takes miralax and repeat   No facility-administered encounter medications on file as of 08/14/2021.    Review of Systems:  Review of Systems Review of Systems  Constitutional: Negative for activity change, appetite change, chills, diaphoresis, fatigue and fever.  HENT: Negative for mouth sores, postnasal drip, rhinorrhea, sinus pain and sore throat.   Respiratory: Negative for apnea, cough, chest tightness, shortness of breath and wheezing.   Cardiovascular: Negative for chest pain, palpitations and leg swelling.  Gastrointestinal: Negative for abdominal distention, abdominal pain, constipation, diarrhea, nausea and vomiting.  Genitourinary: Negative for dysuria and frequency.  Musculoskeletal: Negative for arthralgias, joint swelling and myalgias.  Skin: Positive for rash  Neurological: Negative for dizziness, syncope, weakness, light-headedness and numbness.  Psychiatric/Behavioral: Negative for behavioral problems, confusion  Health Maintenance  Topic Date Due   INFLUENZA VACCINE  07/15/2021   COVID-19 Vaccine (5 - Booster for Moderna series) 09/18/2021   DEXA SCAN  Completed   Zoster Vaccines- Shingrix  Completed   HPV VACCINES  Aged Out   TETANUS/TDAP  Discontinued   PNA vac Low Risk Adult  Discontinued    Physical Exam: Vitals:   08/14/21 0919  BP: (!) 132/94  Pulse: 71  Temp: (!) 96.4 F (35.8 C)  SpO2: 95%  Weight: 169 lb 9.6 oz (76.9 kg)  Height: '5\' 4"'$  (1.626 m)   Body mass index is 29.11 kg/m. Physical Exam Constitutional:  Oriented to person, place, and time. Well-developed and well-nourished.  HENT:  Head: Normocephalic.  Mouth/Throat: Oropharynx is clear and moist.  Eyes: Pupils are equal, round, and reactive to light.  Neck: Neck supple.  Cardiovascular: Normal rate and normal heart sounds.  No murmur heard. Pulmonary/Chest: Effort normal and breath sounds normal. No respiratory distress. No wheezes. She has no rales.  Abdominal: Soft. Bowel sounds are normal. No distension. There is no tenderness. There is no rebound.  Musculoskeletal: No edema.  Lymphadenopathy: none Neurological: Alert and oriented to person, place, and time.   Skin: Skin is warm and dry. Small bruises  in her Arm area Psychiatric: Normal mood and affect. Behavior is normal. Thought content normal.   Labs reviewed: Basic Metabolic Panel: Recent Labs    12/25/20 0000  NA 141  K 3.7  CL 103  CO2 26*  BUN 14  CREATININE 0.5  CALCIUM 8.7   Liver Function Tests: Recent Labs    12/25/20 0000  AST 22  ALT 11  ALBUMIN 3.3*   No results for input(s): LIPASE, AMYLASE in the last 8760 hours. No results for input(s): AMMONIA in the last 8760 hours. CBC: Recent Labs    12/25/20 0000  WBC 3.1  HGB 12.3  HCT 37  PLT 202   Lipid Panel: Recent Labs    03/11/21 0000  CHOL 167  HDL 57  LDLCALC 80  TRIG 154   No results found for: HGBA1C  Procedures since last visit: No results found.  Assessment/Plan Chronic bilateral low back pain without sciatica Wants to just use Tylenol PRN  Walks with no support now Enrolled in Exercise classes in [place] HLD Off statin Now Does not want to restart Senile purpura [facility]) Has Dermatology appointment Hypertension, essential, benign Off all Meds BP slightly high Will follow in [place]  Insomnia,  Melatonin Failed Is doing Lunesta and not having Side effects so far  Compression fracture of L1 vertebra, sequela Seen By Dr Lorin Mercy Doing well  Neuropathic bladder Continue I and  Out cath   Labs/tests ordered:  * No order type specified * Next appt:  11/25/2021

## 2021-09-19 DIAGNOSIS — D1801 Hemangioma of skin and subcutaneous tissue: Secondary | ICD-10-CM | POA: Diagnosis not present

## 2021-09-19 DIAGNOSIS — L821 Other seborrheic keratosis: Secondary | ICD-10-CM | POA: Diagnosis not present

## 2021-09-19 DIAGNOSIS — D692 Other nonthrombocytopenic purpura: Secondary | ICD-10-CM | POA: Diagnosis not present

## 2021-09-19 DIAGNOSIS — Z85828 Personal history of other malignant neoplasm of skin: Secondary | ICD-10-CM | POA: Diagnosis not present

## 2021-09-19 DIAGNOSIS — D2239 Melanocytic nevi of other parts of face: Secondary | ICD-10-CM | POA: Diagnosis not present

## 2021-09-19 DIAGNOSIS — L570 Actinic keratosis: Secondary | ICD-10-CM | POA: Diagnosis not present

## 2021-10-21 ENCOUNTER — Other Ambulatory Visit: Payer: Self-pay | Admitting: *Deleted

## 2021-10-21 DIAGNOSIS — G4700 Insomnia, unspecified: Secondary | ICD-10-CM

## 2021-10-21 MED ORDER — ESZOPICLONE 2 MG PO TABS: 2.0000 mg | ORAL_TABLET | Freq: Every day | ORAL | 5 refills | Status: DC

## 2021-10-21 NOTE — Telephone Encounter (Signed)
Patient called wanting a refill to be sent to [facility].  Pended Rx and sent to [facility] for approval.

## 2021-11-25 ENCOUNTER — Non-Acute Institutional Stay: Payer: Medicare Other | Admitting: Adult Health

## 2021-11-25 ENCOUNTER — Encounter: Payer: Self-pay | Admitting: Adult Health

## 2021-11-25 ENCOUNTER — Other Ambulatory Visit: Payer: Self-pay

## 2021-11-25 VITALS — BP 146/70 | HR 69 | Temp 96.4°F | Ht 64.0 in | Wt 171.0 lb

## 2021-11-25 DIAGNOSIS — K5901 Slow transit constipation: Secondary | ICD-10-CM | POA: Diagnosis not present

## 2021-11-25 DIAGNOSIS — G8929 Other chronic pain: Secondary | ICD-10-CM

## 2021-11-25 DIAGNOSIS — R339 Retention of urine, unspecified: Secondary | ICD-10-CM

## 2021-11-25 DIAGNOSIS — M545 Low back pain, unspecified: Secondary | ICD-10-CM | POA: Diagnosis not present

## 2021-11-25 DIAGNOSIS — F5101 Primary insomnia: Secondary | ICD-10-CM | POA: Diagnosis not present

## 2021-11-25 DIAGNOSIS — I10 Essential (primary) hypertension: Secondary | ICD-10-CM

## 2021-11-25 DIAGNOSIS — H903 Sensorineural hearing loss, bilateral: Secondary | ICD-10-CM

## 2021-11-25 NOTE — Progress Notes (Signed)
Location:  [facility]  POS: clinic  Provider:  Cindi Carbon, [place] [phone]   Code Status: Full Goals of Care:  Advanced Directives 11/25/2021  Does Patient Have a Medical Advance Directive? Yes  Type of Paramedic of [place];Living will  Does patient want to make changes to medical advance directive? -  Copy of [place] in Chart? Yes - validated most recent copy scanned in chart (See row information)     Chief Complaint  Patient presents with   Medical Management of Chronic Issues    Patient returns to the clinic for her 4 month follow up.     HPI: Patient is a 87 y.o. female seen today for medical management of chronic diseases.    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  MMSE 02/01/21 30/30 passed clock  She self administers her meds except Lunesta  Tapered off statin per pt request Had some loose stools but is taking senokot daily for three days then miralax and then will hold if loose stools.  Reports some low back pain. Its intermittent. Not radiating. Has been walking more and attending water aerobics. Also doing weight lifting. Uses a cane when going to the store.  Past Medical History:  Diagnosis Date   Allergy    Anxiety    Arthritis    Bursitis    right leg   Family history of kidney cancer    Family history of lung cancer    Family history of uterine cancer    GERD (gastroesophageal reflux disease)    History of blood transfusion    History of uterine cancer 01/19/2019   Hyperlipidemia    Hypertension    Insomnia    Non Hodgkin's lymphoma ([place]) 2017-2019   cured   Osteopenia    MILD   Urine retention    Uterine cancer ([place])    at age 32    Past Surgical History:   Procedure Laterality Date   ABDOMINAL HYSTERECTOMY     AGE 63   APPENDECTOMY     AGE 74   BLADDER SURGERY     Catarct Surgery     ECTOPIC PREGNANCY SURGERY     age 72   LAPAROSCOPIC SMALL BOWEL RESECTION     NM PET DX LYMPHOMA  10/2018   IN REMISSION   occular pressure     OTHER SURGICAL HISTORY     REPLACEMENT TOTAL KNEE BILATERAL     TONSILLECTOMY AND ADENOIDECTOMY     age 36 or 64   UTERINE CANCER SURGERY     age 22- located in vagina    Allergies  Allergen Reactions   Cefuroxime    Clindamycin/Lincomycin    Levofloxacin    Lisinopril    Prochlorperazine Edisylate    Psyllium    Sulfa Antibiotics     Outpatient Encounter Medications as of 11/25/2021  Medication Sig   Acetaminophen 500 MG capsule Take one tablet by mouth in the morning as needed and Take one tablet by mouth in the evening as needed.   antiseptic oral rinse (BIOTENE) LIQD 15 mLs by Mouth Rinse route at bedtime.   bimatoprost (LUMIGAN) 0.01 % SOLN Place 1 drop into both eyes at bedtime.   brimonidine (ALPHAGAN) 0.15 % ophthalmic solution Place 1 drop into both  eyes 3 (three) times daily.   cetirizine (ZYRTEC) 10 MG tablet Take 10 mg by mouth at bedtime.    eszopiclone (LUNESTA) 2 MG TABS tablet Take 1 tablet (2 mg total) by mouth at bedtime. Take immediately before bedtime   nitrofurantoin (MACRODANTIN) 50 MG capsule Take 1 capsule (50 mg total) by mouth daily.   Polyethyl Glycol-Propyl Glycol (SYSTANE) 0.4-0.3 % GEL ophthalmic gel Place 1 application into both eyes at bedtime.   polyethylene glycol (MIRALAX / GLYCOLAX) 17 g packet Take 17 g by mouth as directed. Every 4 days   Propylene Glycol (LUBRICANT EYE DROPS) 0.6 % SOLN Apply to eye as needed.   sennosides-docusate sodium (SENOKOT-S) 8.6-50 MG tablet Take 2 tablets by mouth daily as needed for constipation. For 3 days, then takes miralax and repeat   No facility-administered encounter medications on file as of 11/25/2021.    Review of Systems:   Review of Systems  Constitutional:  Negative for activity change, appetite change, chills, diaphoresis, fatigue, fever and unexpected weight change.  HENT:  Negative for congestion.   Respiratory:  Negative for cough, shortness of breath and wheezing.   Cardiovascular:  Negative for chest pain, palpitations and leg swelling.  Gastrointestinal:  Positive for constipation and diarrhea. Negative for abdominal distention and abdominal pain.  Genitourinary:  Negative for difficulty urinating and dysuria.  Musculoskeletal:  Positive for back pain and gait problem. Negative for arthralgias, joint swelling and myalgias.  Neurological:  Negative for dizziness, tremors, seizures, syncope, facial asymmetry, speech difficulty, weakness, light-headedness, numbness and headaches.  Psychiatric/Behavioral:  Negative for agitation, behavioral problems and confusion.    Health Maintenance  Topic Date Due   COVID-19 Vaccine (5 - Booster for Moderna series) 11/20/2021   Pneumonia Vaccine 26+ Years old  Completed   INFLUENZA VACCINE  Completed   DEXA SCAN  Completed   Zoster Vaccines- Shingrix  Completed   HPV VACCINES  Aged Out   TETANUS/TDAP  Discontinued    Physical Exam: Vitals:   11/25/21 1313  BP: (!) 146/70  Pulse: 69  Temp: (!) 96.4 F (35.8 C)  SpO2: 93%  Weight: 171 lb (77.6 kg)  Height: 5\' 4"  (1.626 m)   Body mass index is 29.35 kg/m. Physical Exam Vitals and nursing note reviewed.  Constitutional:      General: She is not in acute distress.    Appearance: She is not diaphoretic.  HENT:     Head: Normocephalic and atraumatic.     Right Ear: Tympanic membrane normal.     Left Ear: Tympanic membrane, ear canal and external ear normal.     Ears:     Comments: Right ear canal with small amt of dried blood    Nose: Nose normal.     Mouth/Throat:     Mouth: Mucous membranes are moist.     Pharynx: Oropharynx is clear.  Neck:     Vascular: No JVD.  Cardiovascular:     Rate and  Rhythm: Normal rate and regular rhythm.     Heart sounds: No murmur heard. Pulmonary:     Effort: Pulmonary effort is normal. No respiratory distress.     Breath sounds: Normal breath sounds. No wheezing.  Abdominal:     General: Abdomen is flat. Bowel sounds are normal. There is no distension.     Palpations: Abdomen is soft.     Tenderness: There is no abdominal tenderness.  Musculoskeletal:        General: No swelling, tenderness, deformity or  signs of injury.     Cervical back: No rigidity or tenderness.     Comments: Neg SLR Trace BLE edema  Lymphadenopathy:     Cervical: No cervical adenopathy.  Skin:    General: Skin is warm and dry.  Neurological:     Mental Status: She is alert and oriented to person, place, and time.    Labs reviewed: Basic Metabolic Panel: Recent Labs    12/25/20 0000 08/12/21 0000  NA 141 144  K 3.7 4.4  CL 103 107  CO2 26* 27*  BUN 14 15  CREATININE 0.5 0.6  CALCIUM 8.7 9.0   Liver Function Tests: Recent Labs    12/25/20 0000  AST 22  ALT 11  ALBUMIN 3.3*   No results for input(s): LIPASE, AMYLASE in the last 8760 hours. No results for input(s): AMMONIA in the last 8760 hours. CBC: Recent Labs    12/25/20 0000 08/12/21 0000  WBC 3.1 3.7  HGB 12.3 11.9*  HCT 37 36  PLT 202 160   Lipid Panel: Recent Labs    03/11/21 0000  CHOL 167  HDL 57  LDLCALC 80  TRIG 154   No results found for: HGBA1C  Procedures since last visit: No results found.  Assessment/Plan  1. Hypertension, essential, benign Controlled off losartan Goal <150/90   2. Sensorineural hearing loss (SNHL) of both ears Has hearing aides  3. Incomplete emptying of bladder Continues self catheterization without issues and is followed by urology   4. Primary insomnia Continues Lunesta, failed prior trial off  5. Chronic bilateral low back pain without sciatica Slight worsening with increased activity Ok to continue using the tylenol prn for   pain Can use the walker for support if needed Stretching exercises  6. Slow transit constipation Continue Senokot and miralax as previously prescribed which she admins on her won  Could decrease senokot for loose stools.   7. HLD Off statin Will recheck lipid at next visit   Total time 20min:  time greater than 50% of total time spent doing pt counseling and coordination of care    Labs/tests ordered:  * No order type specified * CBC BMP Lipid before next apt Next appt:  4 months with Dr Lyndel Safe

## 2021-12-05 ENCOUNTER — Encounter: Payer: Self-pay | Admitting: Gastroenterology

## 2021-12-06 ENCOUNTER — Other Ambulatory Visit: Payer: Self-pay

## 2021-12-06 ENCOUNTER — Ambulatory Visit (INDEPENDENT_AMBULATORY_CARE_PROVIDER_SITE_OTHER): Payer: Medicare Other | Admitting: Family

## 2021-12-06 ENCOUNTER — Encounter: Payer: Self-pay | Admitting: Family

## 2021-12-06 VITALS — BP 130/80 | HR 68 | Temp 98.1°F | Resp 16 | Ht 64.0 in | Wt 167.0 lb

## 2021-12-06 DIAGNOSIS — R197 Diarrhea, unspecified: Secondary | ICD-10-CM

## 2021-12-06 DIAGNOSIS — R1912 Hyperactive bowel sounds: Secondary | ICD-10-CM | POA: Diagnosis not present

## 2021-12-06 NOTE — Progress Notes (Signed)
Provider: Generoso Cropper FNP-C  Virgie Dad, MD  Patient Care Team: Virgie Dad, MD as PCP - General (Internal Medicine) Rolm Bookbinder, MD as Consulting Physician (Dermatology) Marybelle Killings, MD as Consulting Physician (Orthopedic Surgery) Jola Schmidt, MD as Consulting Physician (Ophthalmology) Milus Banister, MD as Attending Physician (Gastroenterology) Regal, Tamala Fothergill, DPM as Consulting Physician (Podiatry) Lucas Mallow, MD as Consulting Physician (Urology) Jodi Marble, MD as Consulting Physician (Otolaryngology)  Extended Emergency Contact Information Primary Emergency Contact: [facility] Address: [address]          [place], [place] [address] [name] of [place] Phone: [phone] Relation: Son Secondary Emergency Contact: [name] Address: [address] [place] [address] [name] of [place] Mobile Phone: [phone] Relation: Son  Code Status:  Full Code  Goals of care: Advanced Directive information Advanced Directives 12/06/2021  Does Patient Have a Medical Advance Directive? Yes  Type of Paramedic of [place];Living will  Does patient want to make changes to medical advance directive? No - Patient declined  Copy of [place] in Chart? Yes - validated most recent copy scanned in chart (See row information)     Chief Complaint  Patient presents with   Acute Visit    Patient complains of diarrhea.    HPI:  Pt is a 87 y.o. female seen today for an acute visit for evaluation of diarrhea x 6 weeks.Has sudden onset watery/loose stool.she was walking into a Restaurant when she had a sudden gush of loose stool.The second time it happened at her apartment.This week she was at her son's house eating dinner had another episode.Has not changed her diet.No new medication.  Has hx of small bowels resection. States fell 14 yrs ago and fractured her lower back an  would have 3 days without a bowel movement.  Has been able to regulate with Miralax and Senokot  She saw Dr.[facility] 4 yrs ago. States cannot be seen until 12/26/2020.  No abdominal pain or cramping. No fever,chills,nausea or vomiting.  Has not seen any dark/tarry stool,mucus or blood.  Last colonoscopy June,2019  Has had 4 lbs weight loss over over 11 days.Appetite described as fair.      Past Medical History:  Diagnosis Date   Allergy    Anxiety    Arthritis    Bursitis    right leg   Family history of kidney cancer    Family history of lung cancer    Family history of uterine cancer    GERD (gastroesophageal reflux disease)    History of blood transfusion    History of uterine cancer 01/19/2019   Hyperlipidemia    Hypertension    Insomnia    Non Hodgkin's lymphoma ([place]) 2017-2019   cured   Osteopenia    MILD   Urine retention    Uterine cancer ([place])    at age 64   Past Surgical History:  Procedure Laterality Date   ABDOMINAL HYSTERECTOMY     AGE 34   APPENDECTOMY     AGE 41   BLADDER SURGERY     Catarct Surgery     ECTOPIC PREGNANCY SURGERY     age 27   LAPAROSCOPIC SMALL BOWEL RESECTION     NM PET DX LYMPHOMA  10/2018   IN REMISSION   occular pressure     OTHER SURGICAL HISTORY     REPLACEMENT  TOTAL KNEE BILATERAL     TONSILLECTOMY AND ADENOIDECTOMY     age 86 or 6   UTERINE CANCER SURGERY     age 54- located in vagina    Allergies  Allergen Reactions   Cefuroxime    Clindamycin/Lincomycin    Levofloxacin    Lisinopril    Prochlorperazine Edisylate    Psyllium    Sulfa Antibiotics     Outpatient Encounter Medications as of 12/06/2021  Medication Sig   Acetaminophen 500 MG capsule Take one tablet by mouth in the morning as needed and Take one tablet by mouth in the evening as needed.   antiseptic oral rinse (BIOTENE) LIQD 15 mLs by Mouth Rinse route at bedtime.   bimatoprost (LUMIGAN) 0.01 % SOLN Place 1 drop into both eyes at bedtime.    brimonidine (ALPHAGAN) 0.15 % ophthalmic solution Place 1 drop into both eyes 3 (three) times daily.   cetirizine (ZYRTEC) 10 MG tablet Take 10 mg by mouth at bedtime.    eszopiclone (LUNESTA) 2 MG TABS tablet Take 1 tablet (2 mg total) by mouth at bedtime. Take immediately before bedtime   nitrofurantoin (MACRODANTIN) 50 MG capsule Take 1 capsule (50 mg total) by mouth daily.   Polyethyl Glycol-Propyl Glycol (SYSTANE) 0.4-0.3 % GEL ophthalmic gel Place 1 application into both eyes at bedtime.   polyethylene glycol (MIRALAX / GLYCOLAX) 17 g packet Take 17 g by mouth as needed.   Propylene Glycol (LUBRICANT EYE DROPS) 0.6 % SOLN Apply to eye as needed.   Sennosides-Docusate Sodium 8.6-50 MG CAPS Take by mouth as needed.   [DISCONTINUED] sennosides-docusate sodium (SENOKOT-S) 8.6-50 MG tablet Take 2 tablets by mouth daily as needed for constipation. For 3 days, then takes miralax and repeat   No facility-administered encounter medications on file as of 12/06/2021.    Review of Systems  Constitutional:  Negative for appetite change, chills, fatigue, fever and unexpected weight change.  HENT:  Negative for congestion, dental problem, ear discharge, ear pain, facial swelling, hearing loss, nosebleeds, postnasal drip, rhinorrhea, sinus pressure, sinus pain, sneezing and sore throat.   Respiratory:  Negative for cough, chest tightness, shortness of breath and wheezing.   Cardiovascular:  Negative for chest pain, palpitations and leg swelling.  Gastrointestinal:  Positive for diarrhea. Negative for abdominal distention, abdominal pain, blood in stool, constipation, nausea and vomiting.  Endocrine: Negative for cold intolerance and heat intolerance.  Skin:  Negative for color change, pallor and rash.  Neurological:  Negative for dizziness, light-headedness and headaches.  Psychiatric/Behavioral:  Negative for agitation, behavioral problems, confusion, hallucinations and sleep disturbance. The patient is  not nervous/anxious.    Immunization History  Administered Date(s) Administered   Influenza, High Dose Seasonal PF 09/26/2014, 08/17/2015, 08/19/2016, 08/20/2017, 08/24/2018, 09/23/2019, 10/05/2020   Influenza-Unspecified 10/05/2020, 09/18/2021   Moderna SARS-COV2 Booster Vaccination 05/19/2021   Moderna Sars-Covid-2 Vaccination 12/27/2019, 01/24/2020, 10/25/2020, 09/25/2021   Pneumococcal Conjugate-13 09/07/2014   Pneumococcal Polysaccharide-23 09/21/2007   Tdap 05/23/2010   Zoster Recombinat (Shingrix) 09/22/2017, 11/25/2017   Zoster, Live 10/24/2005   Pertinent  Health Maintenance Due  Topic Date Due   INFLUENZA VACCINE  Completed   DEXA SCAN  Completed   Fall Risk 03/20/2021 04/15/2021 08/14/2021 11/25/2021 12/06/2021  Falls in the past year? 0 0 0 0 0  Was there an injury with Fall? - - - 0 0  Fall Risk Category Calculator - - - 0 0  Fall Risk Category - - - Low Low  Patient Fall Risk Level - - -  Low fall risk Low fall risk  Patient at Risk for Falls Due to - - - No Fall Risks No Fall Risks  Fall risk Follow up - - Falls evaluation completed Falls evaluation completed Falls evaluation completed   Functional Status Survey:    Vitals:   12/06/21 1320  BP: 130/80  Pulse: 68  Resp: 16  Temp: 98.1 F (36.7 C)  SpO2: 98%  Weight: 167 lb (75.8 kg)  Height: 5\' 4"  (1.626 m)   Body mass index is 28.67 kg/m. Physical Exam Vitals reviewed.  Constitutional:      General: She is not in acute distress.    Appearance: Normal appearance. She is overweight. She is not ill-appearing or diaphoretic.  HENT:     Mouth/Throat:     Mouth: Mucous membranes are moist.     Pharynx: Oropharynx is clear. No oropharyngeal exudate or posterior oropharyngeal erythema.  Eyes:     General: No scleral icterus.       Right eye: No discharge.        Left eye: No discharge.     Extraocular Movements: Extraocular movements intact.     Conjunctiva/sclera: Conjunctivae normal.     Pupils: Pupils  are equal, round, and reactive to light.  Neck:     Vascular: No carotid bruit.  Cardiovascular:     Rate and Rhythm: Normal rate and regular rhythm.     Pulses: Normal pulses.     Heart sounds: Normal heart sounds. No murmur heard.   No friction rub. No gallop.  Pulmonary:     Effort: Pulmonary effort is normal. No respiratory distress.     Breath sounds: Normal breath sounds. No wheezing, rhonchi or rales.  Chest:     Chest wall: No tenderness.  Abdominal:     General: Bowel sounds are increased. There is no distension.     Palpations: Abdomen is soft. There is no mass.     Tenderness: There is no abdominal tenderness. There is no right CVA tenderness, left CVA tenderness, guarding or rebound.  Musculoskeletal:        General: No swelling or tenderness. Normal range of motion.     Cervical back: Normal range of motion. No rigidity or tenderness.     Right lower leg: No edema.     Left lower leg: No edema.  Lymphadenopathy:     Cervical: No cervical adenopathy.  Skin:    General: Skin is warm and dry.     Coloration: Skin is not pale.     Findings: No bruising, erythema, lesion or rash.  Neurological:     Mental Status: She is alert and oriented to person, place, and time.     Motor: No weakness.     Gait: Gait normal.  Psychiatric:        Mood and Affect: Mood normal.        Speech: Speech normal.        Behavior: Behavior normal.        Thought Content: Thought content normal.        Judgment: Judgment normal.    Labs reviewed: Recent Labs    12/25/20 0000 08/12/21 0000  NA 141 144  K 3.7 4.4  CL 103 107  CO2 26* 27*  BUN 14 15  CREATININE 0.5 0.6  CALCIUM 8.7 9.0   Recent Labs    12/25/20 0000  AST 22  ALT 11  ALBUMIN 3.3*   Recent Labs    12/25/20 0000  08/12/21 0000  WBC 3.1 3.7  HGB 12.3 11.9*  HCT 37 36  PLT 202 160   No results found for: TSH No results found for: HGBA1C Lab Results  Component Value Date   CHOL 167 03/11/2021   HDL 57  03/11/2021   LDLCALC 80 03/11/2021   TRIG 154 03/11/2021   CHOLHDL 3 08/24/2018    Significant Diagnostic Results in last 30 days:  No results found.  Assessment/Plan  1. Diarrhea, unspecified type Afebrile.No abdominal distension or tenderness on palpation.Hyperactive bowel sounds noted x 4 Quadrant.  - encouraged bland diet then advance as tolerated  - OTC imodium PRN  - follow up wit GI as schedule  - Stool culture  2. Hyperactive bowel sounds Hyperactive BS  x 4 quadrant  Will obtain imaging to rule out obstruction  - advised to notify provider or go to ED if symptoms worsen or fail to improve  - DG Abd 1 View  Family/ staff Communication: Reviewed plan of care with patient verbalized understanding   Labs/tests ordered:  - Stool culture - DG Abd 1 View  Next Appointment: As needed if symptoms worsen or fail to improve    Sandrea Hughs, NP

## 2021-12-06 NOTE — Patient Instructions (Addendum)
- Follow up with Gastroenterology as scheduled - please - Please get abdominal X-ray at [facility] at [facility] then will call you with results.   Diarrhea, Adult Diarrhea is frequent loose and watery bowel movements. Diarrhea can make you feel weak and cause you to become dehydrated. Dehydration can make you tired and thirsty, cause you to have a dry mouth, and decrease how often you urinate. Diarrhea typically lasts 2-3 days. However, it can last longer if it is a sign of something more serious. It is important to treat your diarrhea as told by your health care provider. Follow these instructions at home: Eating and drinking   Follow these recommendations as told by your health care provider: Take an oral rehydration solution (ORS). This is an over-the-counter medicine that helps return your body to its normal balance of nutrients and water. It is found at pharmacies and retail stores. Drink plenty of fluids, such as water, ice chips, diluted fruit juice, and low-calorie sports drinks. You can drink milk also, if desired. Avoid drinking fluids that contain a lot of sugar or caffeine, such as energy drinks, sports drinks, and soda. Eat bland, easy-to-digest foods in small amounts as you are able. These foods include bananas, applesauce, rice, lean meats, toast, and crackers. Avoid alcohol. Avoid spicy or fatty foods.  Medicines Take over-the-counter and prescription medicines only as told by your health care provider. If you were prescribed an antibiotic medicine, take it as told by your health care provider. Do not stop using the antibiotic even if you start to feel better. General instructions  Wash your hands often using soap and water. If soap and water are not available, use a hand sanitizer. Others in the household should wash their hands as well. Hands should be washed: After using the toilet or changing a diaper. Before preparing, cooking, or serving  food. While caring for a sick person or while visiting someone in a hospital. Drink enough fluid to keep your urine pale yellow. Rest at home while you recover. Watch your condition for any changes. Take a warm bath to relieve any burning or pain from frequent diarrhea episodes. Keep all follow-up visits as told by your health care provider. This is important. Contact a health care provider if: You have a fever. Your diarrhea gets worse. You have new symptoms. You cannot keep fluids down. You feel light-headed or dizzy. You have a headache. You have muscle cramps. Get help right away if: You have chest pain. You feel extremely weak or you faint. You have bloody or black stools or stools that look like tar. You have severe pain, cramping, or bloating in your abdomen. You have trouble breathing or you are breathing very quickly. Your heart is beating very quickly. Your skin feels cold and clammy. You feel confused. You have signs of dehydration, such as: Dark urine, very little urine, or no urine. Cracked lips. Dry mouth. Sunken eyes. Sleepiness. Weakness. Summary Diarrhea is frequent loose and sometimes watery bowel movements. Diarrhea can make you feel weak and cause you to become dehydrated. Drink enough fluids to keep your urine pale yellow. Make sure that you wash your hands after using the toilet. If soap and water are not available, use hand sanitizer. Contact a health care provider if your diarrhea gets worse or you have new symptoms. Get help right away if you have signs of dehydration. This information is not intended to replace advice given to you by your health care  provider. Make sure you discuss any questions you have with your health care provider. Document Revised: 06/12/2021 Document Reviewed: 06/12/2021 Elsevier Patient Education  Linda [name].

## 2021-12-09 DIAGNOSIS — R197 Diarrhea, unspecified: Secondary | ICD-10-CM | POA: Diagnosis not present

## 2021-12-10 ENCOUNTER — Other Ambulatory Visit: Payer: Self-pay

## 2021-12-10 ENCOUNTER — Ambulatory Visit
Admission: RE | Admit: 2021-12-10 | Discharge: 2021-12-10 | Disposition: A | Discharge status: Home / Self Care | Payer: Medicare Other | Source: Ambulatory Visit | Attending: Family | Admitting: Family

## 2021-12-10 DIAGNOSIS — R1031 Right lower quadrant pain: Secondary | ICD-10-CM | POA: Diagnosis not present

## 2021-12-20 ENCOUNTER — Telehealth: Payer: Self-pay | Admitting: *Deleted

## 2021-12-20 NOTE — Telephone Encounter (Signed)
Please call facility Nurse to fax stool culture report to office or give to Dr.Gupta or Nurse practitioner at the facility

## 2021-12-20 NOTE — Telephone Encounter (Signed)
No stool culture results received yet.Taking any imodium?

## 2021-12-20 NOTE — Telephone Encounter (Signed)
Patient called and stated that she was seen on 12/23 for Diarrhea. Stated that it is no better and wonders if you have received the Stool Culture that you ordered.   Please Advise.

## 2021-12-20 NOTE — Telephone Encounter (Signed)
Patient stated that she is surprised that you haven't received the stool culture because she stated that she went to the nurses desk and asked her to look it up and the nurse told her that it came back clear/normal.  I asked if she would have that nurse fax that report to our office because that is not in our record. She stated that she will.   Patient stated that she is not taking Imodium. Stated that she has an appointment with [facility] on 01/03/22   Please Advise.

## 2021-12-23 NOTE — Telephone Encounter (Signed)
Linda [name], will you see if the nurse or Dr. Lyndel Safe has this report there at [facility]?Thanks.

## 2021-12-25 NOTE — Telephone Encounter (Signed)
Do you know if Dr. Lyndel Safe has received this report. Would you mind checking on this. Thanks.

## 2021-12-25 NOTE — Telephone Encounter (Signed)
Report was just faxed and it is in my in box. Everything is normal . If she  is still having diarrhea she needs to be either seen by [place]  in office or in her room in [place].

## 2021-12-25 NOTE — Telephone Encounter (Signed)
Patient notified and agreed. Stated that she has an appointment with GI on 1/20. Stated that if anything changes or worsens she will contact our office or the nurse there at [facility].

## 2021-12-31 DIAGNOSIS — H40053 Ocular hypertension, bilateral: Secondary | ICD-10-CM | POA: Diagnosis not present

## 2022-01-03 ENCOUNTER — Ambulatory Visit (INDEPENDENT_AMBULATORY_CARE_PROVIDER_SITE_OTHER): Payer: Medicare Other | Admitting: Gastroenterology

## 2022-01-03 ENCOUNTER — Encounter: Payer: Self-pay | Admitting: Gastroenterology

## 2022-01-03 VITALS — BP 118/66 | HR 65 | Ht 64.0 in | Wt 171.0 lb

## 2022-01-03 DIAGNOSIS — R197 Diarrhea, unspecified: Secondary | ICD-10-CM

## 2022-01-03 MED ORDER — CITRUCEL PO POWD: 1.0000 | Freq: Every day | ORAL | Status: DC

## 2022-01-03 NOTE — Patient Instructions (Addendum)
If you are age 86 or older, your body mass index should be between 23-30. Your Body mass index is 29.35 kg/m. If this is out of the aforementioned range listed, please consider follow up with your Primary Care Provider. ________________________________________________________  The [facility] providers would like to encourage you to use [facility] to communicate with providers for non-urgent requests or questions.  Due to long hold times on the telephone, sending your provider a message by [facility] may be a faster and more efficient way to get a response.  Please allow 48 business hours for a response.  Please remember that this is for non-urgent requests.  _______________________________________________________  Please start taking citrucel (orange flavored) powder fiber supplement.  This may cause some bloating at first but that usually goes away. Begin with a small spoonful and work your way up to a large, heaping spoonful daily over a week.  You are scheduled to follow up on 02-25-22 at 3:20pm.  Thank you for entrusting me with your care and choosing [facility].  Dr Ardis Hughs

## 2022-01-03 NOTE — Progress Notes (Signed)
Review of pertinent gastrointestinal problems: 1.  [facility] [place] EGD 2018 suggest that she had MALToma.  Treated with H. pylori antibiotics.  Second opinion at [facility].  Repeat EGD [facility] November 2018 showed "lesion had healed and path report showed no evidence of MALToma anymore".  EGD Dr. Ardis Hughs June 2019 found mild nonspecific inflammation in her stomach.  Stomach was biopsied proximally and distally.  Pathology showed no sign of MALToma.   HPI: This is a very pleasant 88 year old woman who is here with her son today  I last saw her about 3-1/2 years ago at the time of an upper endoscopy June 2019, follow-up for her MALToma.  See those results summarized above.  She is here today for a different problem  Blood work August 2022 CBC was normal except for hemoglobin 11.9  CT scan abdomen without IV contrast January 2022 indication "elevated lipase" findings "no CT evidence for pancreatitis"  She is currently living in [facility].  She has noticed a change in her bowels recently.  Specifically she has had 4 or 5 events of urgent, mushy stool output.  No overt bleeding.  In between these events she has her usual bowel movements.  She stopped Senokot and MiraLAX several months ago.    We delved into her bowel history quite a bit today.  She had vaginal cancer about 25 years ago treated with surgery and pelvic radiation.  She has had significant radiation related damage to her urologic system and also her right hip.  She had multiple small bowel obstructions and eventual small bowel resection because of radiation damage to her bowels as well.  These are events that happened well before she moved here from the [place].  Review of systems: Pertinent positive and negative review of systems were noted in the above HPI section. All other review negative.   Past Medical History:  Diagnosis Date   Allergy    Anxiety    Arthritis    Bursitis    right leg   Family  history of kidney cancer    Family history of lung cancer    Family history of uterine cancer    GERD (gastroesophageal reflux disease)    History of blood transfusion    History of uterine cancer 01/19/2019   Hyperlipidemia    Hypertension    Insomnia    Non Hodgkin's lymphoma ([place]) 2017-2019   cured   Osteopenia    MILD   Urine retention    Uterine cancer ([place])    at age 60    Past Surgical History:  Procedure Laterality Date   ABDOMINAL HYSTERECTOMY     AGE 70   APPENDECTOMY     AGE 12   BLADDER SURGERY     Catarct Surgery     ECTOPIC PREGNANCY SURGERY     age 42   LAPAROSCOPIC SMALL BOWEL RESECTION     NM PET DX LYMPHOMA  10/2018   IN REMISSION   occular pressure     OTHER SURGICAL HISTORY     REPLACEMENT TOTAL KNEE BILATERAL     TONSILLECTOMY AND ADENOIDECTOMY     age 27 or 44   UTERINE CANCER SURGERY     age 67- located in vagina    Current Outpatient Medications  Medication Sig Dispense Refill   Acetaminophen 500 MG capsule Take one tablet by mouth in the morning as needed and Take one tablet by mouth in the evening as needed. 30 capsule 0   antiseptic oral  rinse (BIOTENE) LIQD 15 mLs by Mouth Rinse route at bedtime.     bimatoprost (LUMIGAN) 0.01 % SOLN Place 1 drop into both eyes at bedtime.     brimonidine (ALPHAGAN) 0.15 % ophthalmic solution Place 1 drop into both eyes 3 (three) times daily.     cetirizine (ZYRTEC) 10 MG tablet Take 10 mg by mouth at bedtime.      eszopiclone (LUNESTA) 2 MG TABS tablet Take 1 tablet (2 mg total) by mouth at bedtime. Take immediately before bedtime 30 tablet 5   nitrofurantoin (MACRODANTIN) 50 MG capsule Take 1 capsule (50 mg total) by mouth daily.     Propylene Glycol (LUBRICANT EYE DROPS) 0.6 % SOLN Apply to eye as needed.     No current facility-administered medications for this visit.    Allergies as of 01/03/2022 - Review Complete 01/03/2022  Allergen Reaction Noted   Cefuroxime  01/01/2021   Clindamycin/lincomycin   01/01/2021   Levofloxacin  01/01/2021   Lisinopril  01/01/2021   Prochlorperazine edisylate  01/01/2021   Psyllium  01/01/2021   Sulfa antibiotics  01/01/2021    Family History  Problem Relation Age of Onset   Uterine cancer Sister 1       uterine cancer   COPD Maternal Grandfather    Lung cancer Maternal Grandfather    Heart failure Mother 38   Pneumonia Father 30   Dementia Father    Uterine cancer Maternal Grandmother 56   Other Sister        Guillain Barre syndrom   Kidney cancer Son 82   Dementia Paternal Grandmother    Lung cancer Maternal Uncle    Other Son        Familial Hypercholesterolemia   Colon cancer Neg Hx    Esophageal cancer Neg Hx    Liver cancer Neg Hx    Pancreatic cancer Neg Hx    Rectal cancer Neg Hx    Stomach cancer Neg Hx     Social History   Socioeconomic History   Marital status: Single    Spouse name: Not on file   Number of children: Not on file   Years of education: Not on file   Highest education level: Not on file  Occupational History   Occupation: retired  Tobacco Use   Smoking status: Never   Smokeless tobacco: Never  Vaping Use   Vaping Use: Never used  Substance and Sexual Activity   Alcohol use: Yes    Alcohol/week: 7.0 standard drinks    Types: 7 Glasses of wine per week    Comment: one glass of wine with dinner   Drug use: No   Sexual activity: Never  Other Topics Concern   Not on file  Social History Narrative   Social History      Diet? Low to no fiber diet; lactose (no tolerance), low or no soy      Do you drink/eat things with caffeine? No liquid caffeine, occasional chocolate      Marital status?                divorced                    What year were you married? n/a      Do you live in a house, apartment, assisted living, condo, trailer, etc.? Apartment/ retirement      Is it one or more stories? one      How many persons live in  your home? one      Do you have any pets in your home? (please  list) no      Highest level of education completed? Master degree      Current or past profession: Audiologist/ Speech Therapist      Do you exercise?                 yes                     Type & how often? Water aerobics 3 x week      Advanced Directives      Do you have a living will? yes      Do you have a DNR form?        yes                         If not, do you want to discuss one?      Do you have signed POA/HPOA for forms? yes      Functional Status : completed by self      Do you have difficulty bathing or dressing yourself? no      Do you have difficulty preparing food or eating? no      Do you have difficulty managing your medications? No      Do you have difficulty managing your finances? no      Do you have difficulty affording your medications? no   Social Determinants of Health   Financial Resource Strain: Not on file  Food Insecurity: Not on file  Transportation Needs: Not on file  Physical Activity: Not on file  Stress: Not on file  Social Connections: Not on file  Intimate Partner Violence: Not on file     Physical Exam: BP 118/66    Pulse 65    Ht 5\' 4"  (1.626 m)    Wt 171 lb (77.6 kg)    SpO2 98%    BMI 29.35 kg/m  Constitutional: generally well-appearing Psychiatric: alert and oriented x3 Eyes: extraocular movements intact Mouth: oral pharynx moist, no lesions Neck: supple no lymphadenopathy Cardiovascular: heart regular rate and rhythm Lungs: clear to auscultation bilaterally Abdomen: soft, nontender, nondistended, no obvious ascites, no peritoneal signs, normal bowel sounds Extremities: no lower extremity edema bilaterally Skin: no lesions on visible extremities   Assessment and plan: 88 y.o. female with change in bowel habits, history of multiple bowel surgeries and pelvic radiation in the distant past  She is having normal bowel movements and then discrete events of profuse mushy consistency stool with a lot of urgency.  I do have  some concern that these are a obstructive tripe change in her bowels.  Pelvic radiation might have damaged her colon, she also understands that she might have a growth in her colon.  She is 28 and I am hoping that we can avoid invasive testing for her.  I recommended that she not resume her Senokot and also not resume her MiraLAX and instead start taking fiber supplement Citrucel on an every single day basis.  She will return here with her son in about 6 weeks to see if this has led to significant improvement.  If it does not then she understands she will probably need some testing of her colon such as a colonoscopy.   Please see the "Patient Instructions" section for addition details about the plan.   Owens Loffler, MD [facility] 01/03/2022, 2:33  PM  Cc: Virgie Dad, MD  Total time on date of encounter was 45  minutes (this included time spent preparing to see the patient reviewing records; obtaining and/or reviewing separately obtained history; performing a medically appropriate exam and/or evaluation; counseling and educating the patient and family if present; ordering medications, tests or procedures if applicable; and documenting clinical information in the health record).

## 2022-01-13 ENCOUNTER — Encounter: Payer: Self-pay | Admitting: Internal Medicine

## 2022-01-13 ENCOUNTER — Other Ambulatory Visit: Payer: Self-pay | Admitting: Internal Medicine

## 2022-01-13 DIAGNOSIS — G4700 Insomnia, unspecified: Secondary | ICD-10-CM

## 2022-01-13 MED ORDER — ESZOPICLONE 2 MG PO TABS: 2.0000 mg | ORAL_TABLET | Freq: Every day | ORAL | 5 refills | Status: DC

## 2022-01-13 NOTE — Progress Notes (Signed)
Lunesta ordered

## 2022-01-13 NOTE — Progress Notes (Signed)
A user error has taken place.

## 2022-01-22 ENCOUNTER — Non-Acute Institutional Stay: Payer: Medicare Other | Admitting: Internal Medicine

## 2022-01-22 ENCOUNTER — Encounter: Payer: Self-pay | Admitting: Internal Medicine

## 2022-01-22 ENCOUNTER — Other Ambulatory Visit: Payer: Self-pay

## 2022-01-22 VITALS — BP 138/78 | HR 77 | Temp 97.9°F | Ht 64.0 in | Wt 172.8 lb

## 2022-01-22 DIAGNOSIS — I10 Essential (primary) hypertension: Secondary | ICD-10-CM

## 2022-01-22 DIAGNOSIS — M545 Low back pain, unspecified: Secondary | ICD-10-CM

## 2022-01-22 DIAGNOSIS — R197 Diarrhea, unspecified: Secondary | ICD-10-CM | POA: Diagnosis not present

## 2022-01-22 DIAGNOSIS — G8929 Other chronic pain: Secondary | ICD-10-CM

## 2022-01-22 DIAGNOSIS — H903 Sensorineural hearing loss, bilateral: Secondary | ICD-10-CM | POA: Diagnosis not present

## 2022-01-22 DIAGNOSIS — E785 Hyperlipidemia, unspecified: Secondary | ICD-10-CM

## 2022-01-22 DIAGNOSIS — S32010S Wedge compression fracture of first lumbar vertebra, sequela: Secondary | ICD-10-CM | POA: Diagnosis not present

## 2022-01-22 DIAGNOSIS — G4700 Insomnia, unspecified: Secondary | ICD-10-CM

## 2022-01-22 DIAGNOSIS — N319 Neuromuscular dysfunction of bladder, unspecified: Secondary | ICD-10-CM | POA: Diagnosis not present

## 2022-01-22 NOTE — Progress Notes (Signed)
Location:  [place] of Service:  Clinic (12)  Provider:   Code Status: DNR Goals of Care:  Advanced Directives 12/06/2021  Does Patient Have a Medical Advance Directive? Yes  Type of Paramedic of [place];Living will  Does patient want to make changes to medical advance directive? No - Patient declined  Copy of [place] in Chart? Yes - validated most recent copy scanned in chart (See row information)     Chief Complaint  Patient presents with   Medical Management of Chronic Issues    Patient returns to the clinic to discuss her high blood pressure.     HPI: Patient is a 88 y.o. female seen today for medical management of chronic diseases.    Patient has a history of MALT lymphoma PET in 12/2018 was negative for Any recurrence History of cancer of vaginal vault s/p adjuvant radiation therapy over 20 years ago Uses In and Out cath for urination History of chronic constipation with overflow incontinence   Had a fall in 10/21 and sustained L1 compression fracture.  Was not a candidate for kyphoplasty.  Was admitted in acute rehab for care and therapy Patient did eventually get better and is now in an enhanced assisted living.   Follows with Urology for Neuropathic Bladder Continues In And Out Cath   New Issue today was  Hypertension Had few very high readings in facility. 165/90 Was started on Losartan low dose and her BP has been doing well now She wants to know if she needs to continue on it Diarrhea Stool studies were negative Dr Ardis Hughs started her on Citrucel l and that has helped her . She has follow up with GI   N other issues Continues to walk with no assist No Falls Cognitively doing well  Past Medical History:  Diagnosis Date   Allergy    Anxiety    Arthritis    Bursitis    right leg   Family history of kidney cancer    Family history of lung cancer    Family history of  uterine cancer    GERD (gastroesophageal reflux disease)    History of blood transfusion    History of uterine cancer 01/19/2019   Hyperlipidemia    Hypertension    Insomnia    Non Hodgkin's lymphoma ([place]) 2017-2019   cured   Osteopenia    MILD   Urine retention    Uterine cancer ([place])    at age 65    Past Surgical History:  Procedure Laterality Date   ABDOMINAL HYSTERECTOMY     AGE 10   APPENDECTOMY     AGE 49   BLADDER SURGERY     Catarct Surgery     ECTOPIC PREGNANCY SURGERY     age 22   LAPAROSCOPIC SMALL BOWEL RESECTION     NM PET DX LYMPHOMA  10/2018   IN REMISSION   occular pressure     OTHER SURGICAL HISTORY     REPLACEMENT TOTAL KNEE BILATERAL     TONSILLECTOMY AND ADENOIDECTOMY     age 89 or 18   UTERINE CANCER SURGERY     age 62- located in vagina    Allergies  Allergen Reactions   Cefuroxime    Clindamycin/Lincomycin    Levofloxacin    Lisinopril    Prochlorperazine Edisylate    Psyllium    Sulfa Antibiotics     Outpatient Encounter Medications as of 01/22/2022  Medication Sig   Acetaminophen 500 MG capsule Take one tablet by mouth in the morning as needed and Take one tablet by mouth in the evening as needed.   antiseptic oral rinse (BIOTENE) LIQD 15 mLs by Mouth Rinse route at bedtime.   bimatoprost (LUMIGAN) 0.01 % SOLN Place 1 drop into both eyes at bedtime.   brimonidine (ALPHAGAN) 0.15 % ophthalmic solution Place 1 drop into both eyes 3 (three) times daily.   cetirizine (ZYRTEC) 10 MG tablet Take 10 mg by mouth at bedtime.    eszopiclone (LUNESTA) 2 MG TABS tablet Take 1 tablet (2 mg total) by mouth at bedtime. Take immediately before bedtime   methylcellulose (CITRUCEL) oral powder Take 1 packet by mouth daily.   nitrofurantoin (MACRODANTIN) 50 MG capsule Take 1 capsule (50 mg total) by mouth daily.   Propylene Glycol (LUBRICANT EYE DROPS) 0.6 % SOLN Apply to eye as needed.   No facility-administered encounter medications on file as of  01/22/2022.    Review of Systems:  Review of Systems  Constitutional:  Negative for activity change and appetite change.  HENT: Negative.    Respiratory:  Negative for cough and shortness of breath.   Cardiovascular:  Negative for leg swelling.  Gastrointestinal:  Negative for constipation.  Genitourinary:  Positive for difficulty urinating.  Musculoskeletal:  Negative for arthralgias, gait problem and myalgias.  Skin: Negative.   Neurological:  Negative for dizziness and weakness.  Psychiatric/Behavioral:  Negative for confusion, dysphoric mood and sleep disturbance.    Health Maintenance  Topic Date Due   COVID-19 Vaccine (5 - Booster for Moderna series) 05/01/2022 (Originally 11/20/2021)   Pneumonia Vaccine 44+ Years old  Completed   INFLUENZA VACCINE  Completed   DEXA SCAN  Completed   Zoster Vaccines- Shingrix  Completed   HPV VACCINES  Aged Out   TETANUS/TDAP  Discontinued    Physical Exam: Vitals:   01/22/22 0934  BP: 138/78  Pulse: 77  Temp: 97.9 F (36.6 C)  SpO2: 97%  Weight: 172 lb 12.8 oz (78.4 kg)  Height: 5\' 4"  (1.626 m)   Body mass index is 29.66 kg/m. Physical Exam Vitals reviewed.  Constitutional:      Appearance: Normal appearance.  HENT:     Head: Normocephalic.     Nose: Nose normal.     Mouth/Throat:     Mouth: Mucous membranes are moist.     Pharynx: Oropharynx is clear.  Eyes:     Pupils: Pupils are equal, round, and reactive to light.  Cardiovascular:     Rate and Rhythm: Normal rate and regular rhythm.     Pulses: Normal pulses.     Heart sounds: Normal heart sounds. No murmur heard. Pulmonary:     Effort: Pulmonary effort is normal.     Breath sounds: Normal breath sounds.  Abdominal:     General: Abdomen is flat. Bowel sounds are normal.     Palpations: Abdomen is soft.  Musculoskeletal:        General: No swelling.     Cervical back: Neck supple.  Skin:    General: Skin is warm.  Neurological:     General: No focal deficit  present.     Mental Status: She is alert and oriented to person, place, and time.  Psychiatric:        Mood and Affect: Mood normal.        Thought Content: Thought content normal.    Labs reviewed: Basic Metabolic Panel: Recent Labs  08/12/21 0000  NA 144  K 4.4  CL 107  CO2 27*  BUN 15  CREATININE 0.6  CALCIUM 9.0   Liver Function Tests: No results for input(s): AST, ALT, ALKPHOS, BILITOT, PROT, ALBUMIN in the last 8760 hours. No results for input(s): LIPASE, AMYLASE in the last 8760 hours. No results for input(s): AMMONIA in the last 8760 hours. CBC: Recent Labs    08/12/21 0000  WBC 3.7  HGB 11.9*  HCT 36  PLT 160   Lipid Panel: Recent Labs    03/11/21 0000  CHOL 167  HDL 57  LDLCALC 80  TRIG 154   No results found for: HGBA1C  Procedures since last visit: No results found.  Assessment/Plan 1. Hypertension, essential, benign BP good today Continue Cozaar Monitor BP in facility 2. Diarrhea, unspecified type Resolved with Citrucel Has follow up with GI  3. Insomnia, unspecified type Lunesta Failed GDR  4. Chronic bilateral low back pain without sciatica Tylenol   5. Hyperlipidemia, unspecified hyperlipidemia type Refuses Statin Repeat level  6. Compression fracture of L1 vertebra, sequela Pain Controlled on Tylenol PRN  7. Neuropathic bladder Continue In And out cath Follows with Urology On Macrodantin    Labs/tests ordered:  CBC,CMP,TSH Lipid Before next visit Next appt:  03/26/2022

## 2022-01-27 ENCOUNTER — Encounter: Payer: Self-pay | Admitting: Podiatry

## 2022-01-27 ENCOUNTER — Ambulatory Visit (INDEPENDENT_AMBULATORY_CARE_PROVIDER_SITE_OTHER): Payer: Medicare Other | Admitting: Podiatry

## 2022-01-27 ENCOUNTER — Other Ambulatory Visit: Payer: Self-pay

## 2022-01-27 DIAGNOSIS — M779 Enthesopathy, unspecified: Secondary | ICD-10-CM | POA: Diagnosis not present

## 2022-01-28 NOTE — Progress Notes (Signed)
Subjective:   Patient ID: Linda [name], female   DOB: 88 y.o.   MRN: [id number]   HPI Patient presents requesting new orthotics stating last pair was over 4 years ago and they are wearing out   Review of Systems  All other systems reviewed and are negative.      Objective:  Physical Exam Vitals and nursing note reviewed.  Constitutional:      Appearance: She is well-developed.  Pulmonary:     Effort: Pulmonary effort is normal.  Musculoskeletal:        General: Normal range of motion.  Skin:    General: Skin is warm.  Neurological:     Mental Status: She is alert.    Neurovascular status intact muscle strength adequate range of motion adequate with patient found to have moderate foot deformity with depressed arches bilateral and discomfort when she walks too much at a time     Assessment:  Moderate flattening of the arch with structural issues that do well with orthotics     Plan:  H&P all conditions reviewed and x-rays reviewed and today I went ahead and I did casted for functional orthotics to try to elevate the arch reduce the stress on her feet.  Patient will be seen back to recheck  X-rays indicate moderate depression of the arch no indications of advanced arthritis stress fracture with mild osteoporosis noted

## 2022-01-30 ENCOUNTER — Telehealth: Payer: Self-pay

## 2022-01-30 NOTE — Telephone Encounter (Signed)
Casts sent to central fab

## 2022-02-12 ENCOUNTER — Telehealth: Payer: Self-pay | Admitting: Gastroenterology

## 2022-02-12 NOTE — Telephone Encounter (Signed)
Inbound call from patient stating that Dr. Ardis Hughs put her on some medication for diarrhea at her last visit and ever since she has had " raging diarrhea" and is seeking advice as to what she can do In the meantime before her appointment on 3/14. Please advise ?

## 2022-02-12 NOTE — Telephone Encounter (Signed)
Patient called in with complaints of diarrhea multiple times today, lower back pain with bowel movements, and weight gain of 5 lbs. She feels that the Citrucel has helped some, but not entirely. Her next follow up is 3/14, and she would like to know if you have any other recommendations that she can try until then.  ? ?Dr. Ardis Hughs, please advise. Thank you! ?

## 2022-02-14 NOTE — Telephone Encounter (Signed)
Spoke with patient regarding Dr. Ardis Hughs recommendations. Pt verbalized understanding, no further questions. ?

## 2022-02-25 ENCOUNTER — Ambulatory Visit (INDEPENDENT_AMBULATORY_CARE_PROVIDER_SITE_OTHER): Payer: Medicare Other | Admitting: Gastroenterology

## 2022-02-25 ENCOUNTER — Encounter: Payer: Self-pay | Admitting: Gastroenterology

## 2022-02-25 VITALS — BP 110/64 | HR 72 | Ht 64.0 in | Wt 177.0 lb

## 2022-02-25 DIAGNOSIS — R198 Other specified symptoms and signs involving the digestive system and abdomen: Secondary | ICD-10-CM

## 2022-02-25 DIAGNOSIS — R195 Other fecal abnormalities: Secondary | ICD-10-CM

## 2022-02-25 MED ORDER — CITRUCEL PO POWD: 0.5000 | Freq: Every day | ORAL | Status: DC

## 2022-02-25 NOTE — Progress Notes (Signed)
Review of pertinent gastrointestinal problems: ?1.  [facility] [place] EGD 2018 suggest that she had MALToma.  Treated with H. pylori antibiotics.  Second opinion at [facility].  Repeat EGD [facility] November 2018 showed "lesion had healed and path report showed no evidence of MALToma anymore".  EGD Dr. Ardis Hughs June 2019 found mild nonspecific inflammation in her stomach.  Stomach was biopsied proximally and distally.  Pathology showed no sign of MALToma. ?2.  Change in bowel habits.  Evaluation 12/2021 unclear etiology, possibly from remote pelvic radiation.  Stopped Senokot, stop MiraLAX, started fiber supplements on a daily basis ? ?HPI: ?This is a very pleasant 88 year old woman whom I last saw her about 2 months ago ? ?I last saw her here in the office about 2 months ago.  We discussed her change in bowel habits and I recommended that she completely stop taking her Senokot and also her MiraLAX and instead she should start taking fiber supplement Citrucel on an every day, scheduled basis. ? ?She tried the Citrucel every single day and her bowels have been "almost perfect" since then.  She did have a single day of significant loose stools.  She attributed this to a strange veggie burger that was cooked at her facility ? ?She really thinks that the Citrucel has led to her gaining about 6 pounds.  Seems unlikely since it is only 40 or 60 cal per dose. ? ?ROS: complete GI ROS as described in HPI, all other review negative. ? ?Constitutional:  No unintentional weight loss ? ? ?Past Medical History:  ?Diagnosis Date  ? Allergy   ? Anxiety   ? Arthritis   ? Bursitis   ? right leg  ? Family history of kidney cancer   ? Family history of lung cancer   ? Family history of uterine cancer   ? GERD (gastroesophageal reflux disease)   ? History of blood transfusion   ? History of uterine cancer 01/19/2019  ? Hyperlipidemia   ? Hypertension   ? Insomnia   ? Non Hodgkin's lymphoma ([place]) 2017-2019  ? cured  ?  Osteopenia   ? MILD  ? Urine retention   ? Uterine cancer ([place])   ? at age 51  ? ? ?Past Surgical History:  ?Procedure Laterality Date  ? ABDOMINAL HYSTERECTOMY    ? AGE 41  ? APPENDECTOMY    ? AGE 65  ? BLADDER SURGERY    ? Catarct Surgery    ? ECTOPIC PREGNANCY SURGERY    ? age 82  ? LAPAROSCOPIC SMALL BOWEL RESECTION    ? NM PET DX LYMPHOMA  10/2018  ? IN REMISSION  ? occular pressure    ? OTHER SURGICAL HISTORY    ? REPLACEMENT TOTAL KNEE BILATERAL    ? TONSILLECTOMY AND ADENOIDECTOMY    ? age 55 or 18  ? UTERINE CANCER SURGERY    ? age 89- located in vagina  ? ? ?Current Outpatient Medications  ?Medication Instructions  ? Acetaminophen 500 MG capsule Take one tablet by mouth in the morning as needed and Take one tablet by mouth in the evening as needed.  ? bimatoprost (LUMIGAN) 0.01 % SOLN 1 drop, Both Eyes, Daily at bedtime  ? brimonidine (ALPHAGAN) 0.15 % ophthalmic solution 1 drop, Both Eyes, 3 times daily  ? cetirizine (ZYRTEC) 10 mg, Oral, Daily at bedtime  ? eszopiclone (LUNESTA) 2 mg, Oral, Daily at bedtime, Take immediately before bedtime  ? losartan (COZAAR) 12.5 mg, Oral, Daily  ?  methylcellulose (CITRUCEL) oral powder 1 packet, Oral, Daily  ? nitrofurantoin (MACRODANTIN) 50 mg, Oral, Daily  ? Propylene Glycol (LUBRICANT EYE DROPS) 0.6 % SOLN Ophthalmic, As needed  ? ? ?Allergies as of 02/25/2022 - Review Complete 02/25/2022  ?Allergen Reaction Noted  ? Cefuroxime  01/01/2021  ? Clindamycin/lincomycin  01/01/2021  ? Levofloxacin  01/01/2021  ? Lisinopril  01/01/2021  ? Prochlorperazine edisylate  01/01/2021  ? Psyllium  01/01/2021  ? Sulfa antibiotics  01/01/2021  ? ? ?Family History  ?Problem Relation Age of Onset  ? Uterine cancer Sister 31  ?     uterine cancer  ? COPD Maternal Grandfather   ? Lung cancer Maternal Grandfather   ? Heart failure Mother 19  ? Pneumonia Father 1  ? Dementia Father   ? Uterine cancer Maternal Grandmother 32  ? Other Sister   ?     Guillain Barre syndrom  ? Kidney cancer  Son 49  ? Dementia Paternal Grandmother   ? Lung cancer Maternal Uncle   ? Other Son   ?     Familial Hypercholesterolemia  ? Colon cancer Neg Hx   ? Esophageal cancer Neg Hx   ? Liver cancer Neg Hx   ? Pancreatic cancer Neg Hx   ? Rectal cancer Neg Hx   ? Stomach cancer Neg Hx   ? ? ?Social History  ? ?Socioeconomic History  ? Marital status: Single  ?  Spouse name: Not on file  ? Number of children: Not on file  ? Years of education: Not on file  ? Highest education level: Not on file  ?Occupational History  ? Occupation: retired  ?Tobacco Use  ? Smoking status: Never  ? Smokeless tobacco: Never  ?Vaping Use  ? Vaping Use: Never used  ?Substance and Sexual Activity  ? Alcohol use: Yes  ?  Alcohol/week: 7.0 standard drinks  ?  Types: 7 Glasses of wine per week  ?  Comment: one glass of wine with dinner  ? Drug use: No  ? Sexual activity: Never  ?Other Topics Concern  ? Not on file  ?Social History Narrative  ? Social History  ?   ? Diet? Low to no fiber diet; lactose (no tolerance), low or no soy  ?   ? Do you drink/eat things with caffeine? No liquid caffeine, occasional chocolate  ?   ? Marital status?                divorced                    What year were you married? n/a  ?   ? Do you live in a house, apartment, assisted living, condo, trailer, etc.? Apartment/ retirement  ?   ? Is it one or more stories? one  ?   ? How many persons live in your home? one  ?   ? Do you have any pets in your home? (please list) no  ?   ? Highest level of education completed? Master degree  ?   ? Current or past profession: Engineer, technical sales Therapist  ?   ? Do you exercise?                 yes                     Type & how often? Water aerobics 3 x week  ?   ? Advanced Directives  ?   ?  Do you have a living will? yes  ?   ? Do you have a DNR form?        yes                         If not, do you want to discuss one?  ?   ? Do you have signed POA/HPOA for forms? yes  ?   ? Functional Status : completed by self  ?   ? Do  you have difficulty bathing or dressing yourself? no  ?   ? Do you have difficulty preparing food or eating? no  ?   ? Do you have difficulty managing your medications? No  ?   ? Do you have difficulty managing your finances? no  ?   ? Do you have difficulty affording your medications? no  ? ?Social Determinants of Health  ? ?Financial Resource Strain: Not on file  ?Food Insecurity: Not on file  ?Transportation Needs: Not on file  ?Physical Activity: Not on file  ?Stress: Not on file  ?Social Connections: Not on file  ?Intimate Partner Violence: Not on file  ? ? ? ?Physical Exam: ?BP 110/64   Pulse 72   Ht '5\' 4"'$  (1.626 m)   Wt 177 lb (80.3 kg)   BMI 30.38 kg/m?  ?Constitutional: generally well-appearing ?Psychiatric: alert and oriented x3 ?Abdomen: soft, nontender, nondistended, no obvious ascites, no peritoneal signs, normal bowel sounds ?No peripheral edema noted in lower extremities ? ?Assessment and plan: ?88 y.o. female with intermittent loose stools, change in bowels ? ?Stopping MiraLAX and also stopping her Senokot have helped things.  She is not taking a single dose of Citrucel once daily and that seems to really have helped.  She tells me her bowel habits are "almost perfect".  I recommended she continue taking Citrucel daily, perhaps try half dose since she feels that the Citrucel has led her to gain some weight.  She is not satisfied with that that she can either go back up to full dose or try sugar-free Citrucel instead. ? ?She will return to see me in 3 months and sooner if any problems. ? ?Please see the "Patient Instructions" section for addition details about the plan. ? ?Owens Loffler, MD ?[facility] ?02/25/2022, 3:14 PM ? ? ?Total time on date of encounter was 25 minutes (this included time spent preparing to see the patient reviewing records; obtaining and/or reviewing separately obtained history; performing a medically appropriate exam and/or evaluation; counseling and educating  the patient and family if present; ordering medications, tests or procedures if applicable; and documenting clinical information in the health record). ? ?

## 2022-02-25 NOTE — Patient Instructions (Addendum)
If you are age 86 or older, your body mass index should be between 23-30. Your Body mass index is 30.38 kg/m?Marland Kitchen If this is out of the aforementioned range listed, please consider follow up with your Primary Care Provider. ?________________________________________________________ ? ?The [facility] providers would like to encourage you to use [facility] to communicate with providers for non-urgent requests or questions.  Due to long hold times on the telephone, sending your provider a message by [facility] may be a faster and more efficient way to get a response.  Please allow 48 business hours for a response.  Please remember that this is for non-urgent requests.  ?_______________________________________________________ ? ?DISCONTINUE: Imodium ? ?DECREASE: Citrucel to 0.5 packet daily ? ?You will need a follow up appointment in 3 months (June 2023).  We will contact you to schedule this appointment. ? ?Thank you for entrusting me with your care and choosing [facility]. ? ?Dr Ardis Hughs ? ?

## 2022-03-05 ENCOUNTER — Ambulatory Visit: Payer: Medicare Other

## 2022-03-05 ENCOUNTER — Other Ambulatory Visit: Payer: Self-pay

## 2022-03-05 DIAGNOSIS — M779 Enthesopathy, unspecified: Secondary | ICD-10-CM

## 2022-03-05 NOTE — Progress Notes (Signed)
SITUATION: ?Reason for Visit: Fitting and Delivery of Custom Fabricated Foot Orthoses ?Patient Report: Patient reports comfort and is satisfied with device. ? ?OBJECTIVE DATA: ?Patient History / Diagnosis:   ?  ICD-10-CM   ?1. Tendonitis  M77.9   ?  ? ? ?Provided Device:  Custom Functional Foot Orthotics ?    RicheyLAB: [id number] ? ?GOAL OF ORTHOSIS ?- Improve gait ?- Decrease energy expenditure ?- Improve Balance ?- Provide Triplanar stability of foot complex ?- Facilitate motion ? ?ACTIONS PERFORMED ?Patient was fit with foot orthotics trimmed to shoe last. Patient tolerated fittign procedure.  ? ?Patient was provided with verbal and written instruction and demonstration regarding donning, doffing, wear, care, proper fit, function, purpose, cleaning, and use of the orthosis and in all related precautions and risks and benefits regarding the orthosis. ? ?Patient was also provided with verbal instruction regarding how to report any failures or malfunctions of the orthosis and necessary follow up care. Patient was also instructed to contact our office regarding any change in status that may affect the function of the orthosis. ? ?Patient demonstrated independence with proper donning, doffing, and fit and verbalized understanding of all instructions. ? ?PLAN: ?Patient is to follow up in one week or as necessary (PRN). All questions were answered and concerns addressed. Plan of care was discussed with and agreed upon by the patient. ? ?

## 2022-03-26 ENCOUNTER — Non-Acute Institutional Stay: Payer: Medicare Other | Admitting: Internal Medicine

## 2022-03-26 ENCOUNTER — Encounter: Payer: Self-pay | Admitting: Internal Medicine

## 2022-03-26 VITALS — BP 138/90 | HR 62 | Temp 97.9°F | Ht 64.0 in | Wt 174.0 lb

## 2022-03-26 DIAGNOSIS — E785 Hyperlipidemia, unspecified: Secondary | ICD-10-CM | POA: Diagnosis not present

## 2022-03-26 DIAGNOSIS — G8929 Other chronic pain: Secondary | ICD-10-CM

## 2022-03-26 DIAGNOSIS — M545 Low back pain, unspecified: Secondary | ICD-10-CM | POA: Diagnosis not present

## 2022-03-26 DIAGNOSIS — G4700 Insomnia, unspecified: Secondary | ICD-10-CM | POA: Diagnosis not present

## 2022-03-26 DIAGNOSIS — N319 Neuromuscular dysfunction of bladder, unspecified: Secondary | ICD-10-CM | POA: Diagnosis not present

## 2022-03-26 DIAGNOSIS — R197 Diarrhea, unspecified: Secondary | ICD-10-CM

## 2022-03-26 DIAGNOSIS — I10 Essential (primary) hypertension: Secondary | ICD-10-CM

## 2022-04-01 NOTE — Progress Notes (Signed)
? ?Location:  [facility] ?  ?Place of Service:  Clinic (12) ? ?Provider:  ? ?Code Status: DNR ?Goals of Care:  ? ?  12/06/2021  ?  1:23 PM  ?Advanced Directives  ?Does Patient Have a Medical Advance Directive? Yes  ?Type of Paramedic of [place];Living will  ?Does patient want to make changes to medical advance directive? No - Patient declined  ?Copy of [place] in Chart? Yes - validated most recent copy scanned in chart (See row information)  ? ? ? ?Chief Complaint  ?Patient presents with  ? Medical Management of Chronic Issues  ?  Patient returns to the clinic for her 4 month follow up. She would like to discuss her bowels.   ? ? ?HPI: Patient is a 88 y.o. female seen today for medical management of chronic diseases.   ? 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 Had a fall in 10/21 and sustained L1 compression fracture.   ?Follows with Urology for Neuropathic Bladder ?Continues In And Out Cath ?  ?Hypertension ?BP doing well on Cozaar ?She wants to know if she needs to continue on it ?Diarrhea ?Stool studies were negative ?Dr Ardis Hughs started her on Citrucel  and that has helped her . ? ? No other issues ?Continues to walk with no assist ?No Falls ?Cognitively doing well ?Past Medical History:  ?Diagnosis Date  ? Allergy   ? Anxiety   ? Arthritis   ? Bursitis   ? right leg  ? Family history of kidney cancer   ? Family history of lung cancer   ? Family history of uterine cancer   ? GERD (gastroesophageal reflux disease)   ? History of blood transfusion   ? History of uterine cancer 01/19/2019  ? Hyperlipidemia   ? Hypertension   ? Insomnia   ? Non Hodgkin's lymphoma ([place]) 2017-2019  ? cured  ? Osteopenia   ? MILD  ? Urine  retention   ? Uterine cancer ([place])   ? at age 67  ? ? ?Past Surgical History:  ?Procedure Laterality Date  ? ABDOMINAL HYSTERECTOMY    ? AGE 32  ? APPENDECTOMY    ? AGE 45  ? BLADDER SURGERY    ? Catarct Surgery    ? ECTOPIC PREGNANCY SURGERY    ? age 89  ? LAPAROSCOPIC SMALL BOWEL RESECTION    ? NM PET DX LYMPHOMA  10/2018  ? IN REMISSION  ? occular pressure    ? OTHER SURGICAL HISTORY    ? REPLACEMENT TOTAL KNEE BILATERAL    ? TONSILLECTOMY AND ADENOIDECTOMY    ? age 25 or 89  ? UTERINE CANCER SURGERY    ? age 42- located in vagina  ? ? ?Allergies  ?Allergen Reactions  ? Cefuroxime   ? Clindamycin/Lincomycin   ? Levofloxacin   ? Lisinopril   ? Prochlorperazine Edisylate   ? Psyllium   ? Sulfa Antibiotics   ? ? ?Outpatient Encounter Medications as of 03/26/2022  ?Medication Sig  ? Acetaminophen 500 MG capsule Take one tablet by mouth in the morning as needed and Take one tablet by mouth in the evening as needed.  ? bimatoprost (LUMIGAN) 0.01 % SOLN Place 1 drop into both eyes at bedtime.  ?  brimonidine (ALPHAGAN) 0.15 % ophthalmic solution Place 1 drop into both eyes 3 (three) times daily.  ? cetirizine (ZYRTEC) 10 MG tablet Take 10 mg by mouth at bedtime.   ? eszopiclone (LUNESTA) 2 MG TABS tablet Take 1 tablet (2 mg total) by mouth at bedtime. Take immediately before bedtime  ? losartan (COZAAR) 25 MG tablet Take 12.5 mg by mouth daily.  ? methylcellulose (CITRUCEL) oral powder Take 0.5 packets by mouth daily.  ? nitrofurantoin (MACRODANTIN) 50 MG capsule Take 1 capsule (50 mg total) by mouth daily.  ? Propylene Glycol (LUBRICANT EYE DROPS) 0.6 % SOLN Apply to eye as needed.  ? ?No facility-administered encounter medications on file as of 03/26/2022.  ? ? ?Review of Systems:  ?Review of Systems  ?Constitutional:  Negative for activity change and appetite change.  ?HENT: Negative.    ?Respiratory:  Negative for cough and shortness of breath.   ?Cardiovascular:  Negative for leg swelling.  ?Gastrointestinal:  Negative  for constipation.  ?Genitourinary: Negative.   ?Musculoskeletal:  Negative for arthralgias, gait problem and myalgias.  ?Skin: Negative.   ?Neurological:  Negative for dizziness and weakness.  ?Psychiatric/Behavioral:  Negative for confusion, dysphoric mood and sleep disturbance.   ? ?Health Maintenance  ?Topic Date Due  ? COVID-19 Vaccine (5 - Booster for Moderna series) 05/01/2022 (Originally 11/20/2021)  ? INFLUENZA VACCINE  07/15/2022  ? Pneumonia Vaccine 35+ Years old  Completed  ? DEXA SCAN  Completed  ? Zoster Vaccines- Shingrix  Completed  ? HPV VACCINES  Aged Out  ? TETANUS/TDAP  Discontinued  ? ? ?Physical Exam: ?Vitals:  ? 03/26/22 1033  ?BP: 138/90  ?Pulse: 62  ?Temp: 97.9 ?F (36.6 ?C)  ?SpO2: 96%  ?Weight: 174 lb (78.9 kg)  ?Height: '5\' 4"'$  (1.626 m)  ? ?Body mass index is 29.87 kg/m?Marland Kitchen ?Physical Exam ?Vitals reviewed.  ?Constitutional:   ?   Appearance: Normal appearance.  ?HENT:  ?   Head: Normocephalic.  ?   Nose: Nose normal.  ?   Mouth/Throat:  ?   Mouth: Mucous membranes are moist.  ?   Pharynx: Oropharynx is clear.  ?Eyes:  ?   Pupils: Pupils are equal, round, and reactive to light.  ?Cardiovascular:  ?   Rate and Rhythm: Normal rate and regular rhythm.  ?   Pulses: Normal pulses.  ?   Heart sounds: Normal heart sounds. No murmur heard. ?Pulmonary:  ?   Effort: Pulmonary effort is normal.  ?   Breath sounds: Normal breath sounds.  ?Abdominal:  ?   General: Abdomen is flat. Bowel sounds are normal.  ?   Palpations: Abdomen is soft.  ?Musculoskeletal:     ?   General: No swelling.  ?   Cervical back: Neck supple.  ?Skin: ?   General: Skin is warm.  ?Neurological:  ?   General: No focal deficit present.  ?   Mental Status: She is alert and oriented to person, place, and time.  ?Psychiatric:     ?   Mood and Affect: Mood normal.     ?   Thought Content: Thought content normal.  ? ? ?Labs reviewed: ?Basic Metabolic Panel: ?Recent Labs  ?  08/12/21 ?0000  ?NA 144  ?K 4.4  ?CL 107  ?CO2 27*  ?BUN 15   ?CREATININE 0.6  ?CALCIUM 9.0  ? ?Liver Function Tests: ?No results for input(s): AST, ALT, ALKPHOS, BILITOT, PROT, ALBUMIN in the last 8760 hours. ?No results for input(s): LIPASE, AMYLASE in  the last 8760 hours. ?No results for input(s): AMMONIA in the last 8760 hours. ?CBC: ?Recent Labs  ?  08/12/21 ?0000  ?WBC 3.7  ?HGB 11.9*  ?HCT 36  ?PLT 160  ? ?Lipid Panel: ?No results for input(s): CHOL, HDL, LDLCALC, TRIG, CHOLHDL, LDLDIRECT in the last 8760 hours. ?No results found for: HGBA1C ? ?Procedures since last visit: ?No results found. ? ?Assessment/Plan ?1. Hypertension, essential, benign ?Doing well on Cozaar ? ?2. Diarrhea, unspecified type ?Resolved with Citrucel ? ?3. Insomnia, unspecified type ?Lunesta ?Failed GDR ? ?4. Chronic bilateral low back pain without sciatica ?Tylenol PRN ? ?5. Hyperlipidemia, unspecified hyperlipidemia type ?Refuses statin ? ?6. Neuropathic bladder ?Does In and Out Cath ? ? ? ?Labs/tests ordered:  * No order type specified * ?Next appt:  06/30/2022 ? ? ? ? ?

## 2022-04-03 DIAGNOSIS — D692 Other nonthrombocytopenic purpura: Secondary | ICD-10-CM | POA: Diagnosis not present

## 2022-04-03 DIAGNOSIS — M7918 Myalgia, other site: Secondary | ICD-10-CM | POA: Diagnosis not present

## 2022-04-03 DIAGNOSIS — E7849 Other hyperlipidemia: Secondary | ICD-10-CM | POA: Diagnosis not present

## 2022-04-03 DIAGNOSIS — E039 Hypothyroidism, unspecified: Secondary | ICD-10-CM | POA: Diagnosis not present

## 2022-04-03 LAB — LIPID PANEL
Cholesterol: 206 — AB (ref 0–200)
HDL: 62 (ref 35–70)
LDL Cholesterol: 125
LDl/HDL Ratio: 3.3
Triglycerides: 95 (ref 40–160)

## 2022-04-03 LAB — BASIC METABOLIC PANEL
BUN: 18 (ref 4–21)
CO2: 23 — AB (ref 13–22)
Chloride: 114 — AB (ref 99–108)
Creatinine: 0.7 (ref 0.5–1.1)
Glucose: 90
Potassium: 4.8 mEq/L (ref 3.5–5.1)
Sodium: 150 — AB (ref 137–147)

## 2022-04-03 LAB — COMPREHENSIVE METABOLIC PANEL
Albumin: 3.7 (ref 3.5–5.0)
Calcium: 10.9 — AB (ref 8.7–10.7)
Globulin: 2.5

## 2022-04-03 LAB — CBC AND DIFFERENTIAL
HCT: 39 (ref 36–46)
Hemoglobin: 12.9 (ref 12.0–16.0)
Platelets: 155 10*3/uL (ref 150–400)
WBC: 3.7

## 2022-04-03 LAB — HEPATIC FUNCTION PANEL
ALT: 10 U/L (ref 7–35)
AST: 21 (ref 13–35)
Alkaline Phosphatase: 87 (ref 25–125)
Bilirubin, Total: 0.3

## 2022-04-03 LAB — TSH: TSH: 3.54 (ref 0.41–5.90)

## 2022-04-03 LAB — CBC: RBC: 4.24 (ref 3.87–5.11)

## 2022-04-09 ENCOUNTER — Telehealth: Payer: Self-pay

## 2022-04-09 NOTE — Telephone Encounter (Signed)
Incoming fax received from [facility] [employer] to initiate PA for generic Lunesta. ? ?PA was completed via covermy meds, ? ?Key: [id number] ? ?Below is the response: ?This request has received an approval. View the bottom of the request for an electronic copy of the approval letter. ? ? ? ? ? ? ?Letter was printed and faxed to [place]  ?

## 2022-06-06 ENCOUNTER — Encounter: Payer: Self-pay | Admitting: Gastroenterology

## 2022-06-06 ENCOUNTER — Ambulatory Visit (INDEPENDENT_AMBULATORY_CARE_PROVIDER_SITE_OTHER): Payer: Medicare Other | Admitting: Gastroenterology

## 2022-06-06 VITALS — BP 118/60 | HR 60 | Ht 62.5 in | Wt 175.5 lb

## 2022-06-06 DIAGNOSIS — R195 Other fecal abnormalities: Secondary | ICD-10-CM | POA: Diagnosis not present

## 2022-06-30 ENCOUNTER — Non-Acute Institutional Stay: Payer: Medicare Other | Admitting: Adult Health

## 2022-06-30 ENCOUNTER — Encounter: Payer: Self-pay | Admitting: Adult Health

## 2022-06-30 VITALS — BP 112/84 | HR 86 | Temp 97.7°F | Ht 64.0 in | Wt 177.8 lb

## 2022-06-30 DIAGNOSIS — I10 Essential (primary) hypertension: Secondary | ICD-10-CM | POA: Diagnosis not present

## 2022-06-30 DIAGNOSIS — M8000XS Age-related osteoporosis with current pathological fracture, unspecified site, sequela: Secondary | ICD-10-CM

## 2022-06-30 DIAGNOSIS — D72819 Decreased white blood cell count, unspecified: Secondary | ICD-10-CM | POA: Diagnosis not present

## 2022-06-30 DIAGNOSIS — E870 Hyperosmolality and hypernatremia: Secondary | ICD-10-CM | POA: Diagnosis not present

## 2022-06-30 DIAGNOSIS — R339 Retention of urine, unspecified: Secondary | ICD-10-CM

## 2022-06-30 DIAGNOSIS — E782 Mixed hyperlipidemia: Secondary | ICD-10-CM

## 2022-06-30 DIAGNOSIS — H903 Sensorineural hearing loss, bilateral: Secondary | ICD-10-CM | POA: Diagnosis not present

## 2022-06-30 NOTE — Progress Notes (Signed)
Location:  [facility]  POS: Clinic  Provider: Royal Hawthorn, ANP  Code Status: Full Goals of Care:     12/06/2021    1:23 PM  Advanced Directives  Does Patient Have a Medical Advance Directive? Yes  Type of Paramedic of [place];Living will  Does patient want to make changes to medical advance directive? No - Patient declined  Copy of [place] in Chart? Yes - validated most recent copy scanned in chart (See row information)     Chief Complaint  Patient presents with   Medical Management of Chronic Issues    Patient returns to the clinic for her 3 month follow and discuss lab results.    HPI: Patient is a 88 y.o. female seen today for medical management of chronic diseases.    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   Seen by Dr Ardis Hughs 06/06/22 with GI. She was doing well with citrucel to control loose stools. Has possible issues with loose stools due to prior radiation. Did gain some weight after starting the citrucel  Wt Readings from Last 3 Encounters:  06/30/22 177 lb 12.8 oz (80.6 kg)  06/06/22 175 lb 8 oz (79.6 kg)  03/26/22 174 lb (78.9 kg)   She wants to stop taking losartan BP has been in the 120-140 range. Goal <150/90   MMSE 02/01/21 30/30 passed clock    Tapered off statin per pt request LDL 125  OP: 10/04/19: T score -2.1 showing osteopenia with prior hx of compression fx.  Has chosen not to pursue medication for this in the past   Past Medical History:  Diagnosis Date   Allergy    Anxiety    Arthritis    Bursitis    right leg   Family history of kidney cancer    Family history of lung cancer    Family history of uterine cancer    GERD (gastroesophageal reflux disease)    History of blood  transfusion    History of uterine cancer 01/19/2019   Hyperlipidemia    Hypertension    Insomnia    Non Hodgkin's lymphoma ([place]) 2017-2019   cured   Osteopenia    MILD   Urine retention    Uterine cancer ([place])    at age 62    Past Surgical History:  Procedure Laterality Date   ABDOMINAL HYSTERECTOMY     AGE 70   APPENDECTOMY     AGE 25   BLADDER SURGERY     Catarct Surgery     ECTOPIC PREGNANCY SURGERY     age 36   LAPAROSCOPIC SMALL BOWEL RESECTION     NM PET DX LYMPHOMA  10/2018   IN REMISSION   occular pressure     OTHER SURGICAL HISTORY     REPLACEMENT TOTAL KNEE BILATERAL     TONSILLECTOMY AND ADENOIDECTOMY     age 83 or 56   UTERINE CANCER SURGERY     age 46- located in vagina    Allergies  Allergen Reactions   Cefuroxime    Clindamycin/Lincomycin    Levofloxacin    Lisinopril    Prochlorperazine Edisylate    Psyllium    Sulfa Antibiotics     Outpatient Encounter Medications as of 06/30/2022  Medication Sig   Acetaminophen 500 MG capsule Take  one tablet by mouth in the morning as needed and Take one tablet by mouth in the evening as needed.   bimatoprost (LUMIGAN) 0.01 % SOLN Place 1 drop into both eyes at bedtime.   brimonidine (ALPHAGAN) 0.15 % ophthalmic solution Place 1 drop into both eyes 3 (three) times daily.   cetirizine (ZYRTEC) 10 MG tablet Take 10 mg by mouth at bedtime.    eszopiclone (LUNESTA) 2 MG TABS tablet Take 1 tablet (2 mg total) by mouth at bedtime. Take immediately before bedtime   losartan (COZAAR) 25 MG tablet Take 12.5 mg by mouth daily.   methylcellulose (CITRUCEL) oral powder Take 0.5 packets by mouth daily.   nitrofurantoin (MACRODANTIN) 50 MG capsule Take 1 capsule (50 mg total) by mouth daily.   Propylene Glycol (LUBRICANT EYE DROPS) 0.6 % SOLN Apply to eye as needed.   No facility-administered encounter medications on file as of 06/30/2022.    Review of Systems:  Review of Systems  Constitutional:  Negative for activity  change, appetite change, chills, diaphoresis, fatigue, fever and unexpected weight change.  HENT:  Positive for hearing loss. Negative for congestion.   Respiratory:  Negative for cough, shortness of breath and wheezing.   Cardiovascular:  Positive for leg swelling. Negative for chest pain and palpitations.  Gastrointestinal:  Negative for abdominal distention, abdominal pain, constipation and diarrhea.  Genitourinary:  Negative for difficulty urinating and dysuria.  Musculoskeletal:  Negative for arthralgias, back pain, gait problem, joint swelling and myalgias.  Neurological:  Negative for dizziness, tremors, seizures, syncope, facial asymmetry, speech difficulty, weakness, light-headedness, numbness and headaches.  Psychiatric/Behavioral:  Negative for agitation, behavioral problems and confusion.     Health Maintenance  Topic Date Due   COVID-19 Vaccine (6 - Booster for Moderna series) 11/06/2022 (Originally 06/25/2022)   INFLUENZA VACCINE  07/15/2022   Pneumonia Vaccine 52+ Years old  Completed   DEXA SCAN  Completed   Zoster Vaccines- Shingrix  Completed   HPV VACCINES  Aged Out   TETANUS/TDAP  Discontinued    Physical Exam: Vitals:   06/30/22 1411  BP: 112/84  Pulse: 86  Temp: 97.7 F (36.5 C)  SpO2: 95%  Weight: 177 lb 12.8 oz (80.6 kg)  Height: 5' 2.5" (1.588 m)   Body mass index is 32 kg/m. Physical Exam Vitals and nursing note reviewed.  Constitutional:      General: She is not in acute distress.    Appearance: She is not diaphoretic.  HENT:     Head: Normocephalic and atraumatic.     Right Ear: Tympanic membrane normal.     Left Ear: Tympanic membrane normal.     Nose: Nose normal.     Mouth/Throat:     Mouth: Mucous membranes are moist.     Pharynx: Oropharynx is clear. No oropharyngeal exudate.  Neck:     Vascular: No JVD.  Cardiovascular:     Rate and Rhythm: Normal rate and regular rhythm.     Heart sounds: No murmur heard. Pulmonary:     Effort:  Pulmonary effort is normal. No respiratory distress.     Breath sounds: Normal breath sounds. No wheezing.  Abdominal:     General: Bowel sounds are normal. There is no distension.     Palpations: Abdomen is soft. There is no mass.     Tenderness: There is no abdominal tenderness. There is no guarding.  Musculoskeletal:        General: No swelling, tenderness, deformity or signs of injury.  Cervical back: No rigidity or tenderness.     Comments: Trace edema bilat  Lymphadenopathy:     Cervical: No cervical adenopathy.  Skin:    General: Skin is warm and dry.  Neurological:     Mental Status: She is alert and oriented to person, place, and time.  Psychiatric:        Mood and Affect: Mood normal.    Labs reviewed: Basic Metabolic Panel: Recent Labs    08/12/21 0000 04/03/22 0000  NA 144 150*  K 4.4 4.8  CL 107 114*  CO2 27* 23*  BUN 15 18  CREATININE 0.6 0.7  CALCIUM 9.0 10.9*  TSH  --  3.54   Liver Function Tests: Recent Labs    04/03/22 0000  AST 21  ALT 10  ALKPHOS 87  ALBUMIN 3.7   No results for input(s): "LIPASE", "AMYLASE" in the last 8760 hours. No results for input(s): "AMMONIA" in the last 8760 hours. CBC: Recent Labs    08/12/21 0000 04/03/22 0000  WBC 3.7 3.7  HGB 11.9* 12.9  HCT 36 39  PLT 160 155   Lipid Panel: Recent Labs    04/03/22 0000  CHOL 206*  HDL 62  LDLCALC 125  TRIG 95   No results found for: "HGBA1C"  Procedures since last visit: No results found.  Assessment/Plan  1. Hypertension, essential, benign D/c losartan due to below goal readings and pt request  2. Osteoporosis with pathological fracture, sequela Does not want to pursue dexa scan or treatment Continue weight bearing exercise Appropriate calcium intake   3. Hypernatremia Encourage water intake.  Need repeat, was 150 before No current symptoms  4. Incomplete emptying of bladder Self caths with prior hx of vaginal vault surgery due to ca Takes  macrodantin daily for UTI prevention  5. Mixed hyperlipidemia Off statin LDL 125  6. Sensorineural hearing loss (SNHL) of both ears Wears hearing aides.   7. Leukopenia, unspecified type Will repeat CBC borderline low white count.    Labs/tests ordered:  * No order type specified * CBC BMP Thursday 7/17 Next appt:  3 months with Dr Lyndel Safe   Total time 66mn:  time greater than 50% of total time spent doing pt counseling and coordination of care

## 2022-07-01 DIAGNOSIS — H401131 Primary open-angle glaucoma, bilateral, mild stage: Secondary | ICD-10-CM | POA: Diagnosis not present

## 2022-07-01 DIAGNOSIS — H5203 Hypermetropia, bilateral: Secondary | ICD-10-CM | POA: Diagnosis not present

## 2022-07-03 DIAGNOSIS — E785 Hyperlipidemia, unspecified: Secondary | ICD-10-CM | POA: Diagnosis not present

## 2022-07-03 LAB — BASIC METABOLIC PANEL
BUN: 13 (ref 4–21)
CO2: 27 — AB (ref 13–22)
Chloride: 106 (ref 99–108)
Creatinine: 0.6 (ref 0.5–1.1)
Glucose: 85
Potassium: 4.3 mEq/L (ref 3.5–5.1)
Sodium: 142 (ref 137–147)

## 2022-07-03 LAB — CBC AND DIFFERENTIAL
HCT: 37 (ref 36–46)
Hemoglobin: 11.9 — AB (ref 12.0–16.0)
Platelets: 144 10*3/uL — AB (ref 150–400)
WBC: 3

## 2022-07-03 LAB — CBC: RBC: 4.08 (ref 3.87–5.11)

## 2022-07-03 LAB — COMPREHENSIVE METABOLIC PANEL: Calcium: 8.9 (ref 8.7–10.7)

## 2022-07-04 ENCOUNTER — Telehealth: Payer: Self-pay | Admitting: Podiatry

## 2022-07-04 NOTE — Telephone Encounter (Signed)
Called patient to let her know I received her message she left for [name] and that I forwarded it to [place]. Told Linda [name] that [name] was out of the office this week but would call her Monday or Tuesday to follow up with her.

## 2022-07-04 NOTE — Telephone Encounter (Signed)
I'm calling to speak to Linda [name] about an ongoing billing that we have been following. Please call me back. Thank you.

## 2022-07-09 DIAGNOSIS — N312 Flaccid neuropathic bladder, not elsewhere classified: Secondary | ICD-10-CM | POA: Diagnosis not present

## 2022-07-09 DIAGNOSIS — R109 Unspecified abdominal pain: Secondary | ICD-10-CM | POA: Diagnosis not present

## 2022-07-11 ENCOUNTER — Telehealth: Payer: Self-pay | Admitting: *Deleted

## 2022-07-11 NOTE — Telephone Encounter (Signed)
Patient called and stated that she would like to discuss her Abnormal Lab Results with you and would like for you to call her at [phone]

## 2022-07-14 ENCOUNTER — Encounter: Payer: Self-pay | Admitting: Adult Health

## 2022-07-14 NOTE — Telephone Encounter (Signed)
Called pt and discussed CBC result showing mild pancytopenia. Will have nurse at [facility] make f/u apt with Linda [name] she has seen before with oncology.

## 2022-07-15 ENCOUNTER — Telehealth: Payer: Self-pay | Admitting: Hematology and Oncology

## 2022-07-15 NOTE — Telephone Encounter (Signed)
Rescheduled appointment per patients sons request via telephone call. Patients son is aware of the changes made to his mom upcoming appointment.

## 2022-07-15 NOTE — Telephone Encounter (Signed)
Scheduled appointment per 08/01. Patient is aware of appointment date and time. Patient is aware to arrive 15 mins prior to appointment time and to bring updated insurance cards. Patient is aware of location.

## 2022-07-28 DIAGNOSIS — R109 Unspecified abdominal pain: Secondary | ICD-10-CM | POA: Diagnosis not present

## 2022-07-28 DIAGNOSIS — K5730 Diverticulosis of large intestine without perforation or abscess without bleeding: Secondary | ICD-10-CM | POA: Diagnosis not present

## 2022-07-28 DIAGNOSIS — K6389 Other specified diseases of intestine: Secondary | ICD-10-CM | POA: Diagnosis not present

## 2022-08-05 ENCOUNTER — Non-Acute Institutional Stay: Payer: Medicare Other | Admitting: Orthopedic Surgery

## 2022-08-05 ENCOUNTER — Encounter: Payer: Self-pay | Admitting: Orthopedic Surgery

## 2022-08-05 DIAGNOSIS — U071 COVID-19: Secondary | ICD-10-CM

## 2022-08-05 MED ORDER — NIRMATRELVIR/RITONAVIR (PAXLOVID)TABLET: 3.0000 | ORAL_TABLET | Freq: Two times a day (BID) | ORAL | 0 refills | Status: AC

## 2022-08-05 NOTE — Progress Notes (Signed)
Location:  [facility] Room Number: 474/Q Place of Service:  ALF [phone]) Provider:  Yvonna Alanis, NP   Virgie Dad, MD  Patient Care Team: Virgie Dad, MD as PCP - General (Internal Medicine) Rolm Bookbinder, MD as Consulting Physician (Dermatology) Marybelle Killings, MD as Consulting Physician (Orthopedic Surgery) Jola Schmidt, MD as Consulting Physician (Ophthalmology) Milus Banister, MD as Attending Physician (Gastroenterology) Regal, Tamala Fothergill, DPM as Consulting Physician (Podiatry) Lucas Mallow, MD as Consulting Physician (Urology) Jodi Marble, MD as Consulting Physician (Otolaryngology)  Extended Emergency Contact Information Primary Emergency Contact: [name] Address: [address]          [place], [place] [address] [name] of [place] Phone: [phone] Relation: Son Secondary Emergency Contact: [name] Address: [address] [place] [address] [name] of [place] Mobile Phone: [phone] Relation: Son  Code Status:  Full code Goals of care: Advanced Directive information    12/06/2021    1:23 PM  Advanced Directives  Does Patient Have a Medical Advance Directive? Yes  Type of Paramedic of [place];Living will  Does patient want to make changes to medical advance directive? No - Patient declined  Copy of [place] in Chart? Yes - validated most recent copy scanned in chart (See row information)     Chief Complaint  Patient presents with   Acute Visit    Positive covid test    HPI:  Pt is a 88 y.o. female seen today for acute visit due to positive covid test.   She currently resides on the assisted living unit at [facility] PMH: HTN, HLD, venous insufficiency, constipation, osteoporosis, h/o uterine cancer, chronic back pain, and allergies.   This morning she woke up with symptoms of malaise, nasal congestion, fever,  headache and sore throat. Covid test positive. She has been taking tylenol 1000 mg prn for fever. Denies cough, sob, body aches or chills. She has completed Moderna covid vaccine and 4 boosters. Placed on isolation precautions x 10 days per [facility]. Family and patient requesting Paxlovid today. GFR 87 07/03/2022.    Past Medical History:  Diagnosis Date   Allergy    Anxiety    Arthritis    Bursitis    right leg   Family history of kidney cancer    Family history of lung cancer    Family history of uterine cancer    GERD (gastroesophageal reflux disease)    History of blood transfusion    History of uterine cancer 01/19/2019   Hyperlipidemia    Hypertension    Insomnia    Non Hodgkin's lymphoma ([place]) 2017-2019   cured   Osteopenia    MILD   Urine retention    Uterine cancer ([place])    at age 28   Past Surgical History:  Procedure Laterality Date   ABDOMINAL HYSTERECTOMY     AGE 21   APPENDECTOMY     AGE 63   BLADDER SURGERY     Catarct Surgery     ECTOPIC PREGNANCY SURGERY     age 5   LAPAROSCOPIC SMALL BOWEL RESECTION     NM PET DX LYMPHOMA  10/2018   IN REMISSION   occular pressure     OTHER SURGICAL HISTORY     REPLACEMENT TOTAL KNEE BILATERAL     TONSILLECTOMY AND ADENOIDECTOMY     age 11 or 52  UTERINE CANCER SURGERY     age 25- located in vagina    Allergies  Allergen Reactions   Cefuroxime    Clindamycin/Lincomycin    Levofloxacin    Lisinopril    Prochlorperazine Edisylate    Psyllium    Sulfa Antibiotics     Outpatient Encounter Medications as of 08/05/2022  Medication Sig   Acetaminophen 500 MG capsule Take one tablet by mouth in the morning as needed and Take one tablet by mouth in the evening as needed.   bimatoprost (LUMIGAN) 0.01 % SOLN Place 1 drop into both eyes at bedtime.   brimonidine (ALPHAGAN) 0.15 % ophthalmic solution Place 1 drop into both eyes 3 (three) times daily.   cetirizine (ZYRTEC) 10 MG tablet Take 10 mg by mouth at  bedtime.    eszopiclone (LUNESTA) 2 MG TABS tablet Take 1 tablet (2 mg total) by mouth at bedtime. Take immediately before bedtime   methylcellulose (CITRUCEL) oral powder Take 0.5 packets by mouth daily.   nitrofurantoin (MACRODANTIN) 50 MG capsule Take 1 capsule (50 mg total) by mouth daily.   Propylene Glycol (LUBRICANT EYE DROPS) 0.6 % SOLN Apply to eye as needed.   No facility-administered encounter medications on file as of 08/05/2022.    Review of Systems  Constitutional:  Positive for fatigue and fever. Negative for activity change, appetite change and chills.  HENT:  Positive for congestion and sore throat.   Eyes:  Negative for visual disturbance.  Respiratory:  Negative for cough, shortness of breath and wheezing.   Cardiovascular:  Negative for chest pain and leg swelling.  Gastrointestinal:  Negative for diarrhea, nausea and vomiting.  Musculoskeletal:  Negative for myalgias.  Skin:  Negative for rash.  Neurological:  Positive for headaches. Negative for dizziness and weakness.  Psychiatric/Behavioral:  Negative for confusion and dysphoric mood. The patient is not nervous/anxious.     Immunization History  Administered Date(s) Administered   Influenza, High Dose Seasonal PF 09/26/2014, 08/17/2015, 08/19/2016, 08/20/2017, 08/24/2018, 09/23/2019, 10/05/2020   Influenza-Unspecified 10/05/2020, 09/18/2021   Moderna SARS-COV2 Booster Vaccination 05/19/2021   Moderna Sars-Covid-2 Vaccination 12/27/2019, 01/24/2020, 10/25/2020, 09/25/2021, 04/30/2022   Pneumococcal Conjugate-13 09/07/2014   Pneumococcal Polysaccharide-23 09/21/2007   Tdap 05/23/2010   Zoster Recombinat (Shingrix) 09/22/2017, 11/25/2017   Zoster, Live 10/24/2005   Pertinent  Health Maintenance Due  Topic Date Due   INFLUENZA VACCINE  07/15/2022   DEXA SCAN  Completed      11/25/2021    1:14 PM 12/06/2021    1:23 PM 01/22/2022    9:34 AM 03/26/2022   10:36 AM 06/27/2022    3:32 PM  Fall Risk  Falls in  the past year? 0 0 0 0 0  Was there an injury with Fall? 0 0 0 0 0  Fall Risk Category Calculator 0 0 0 0 0  Fall Risk Category Low Low Low Low Low  Patient Fall Risk Level Low fall risk Low fall risk Low fall risk Low fall risk Low fall risk  Patient at Risk for Falls Due to No Fall Risks No Fall Risks No Fall Risks No Fall Risks No Fall Risks  Fall risk Follow up Falls evaluation completed Falls evaluation completed Falls evaluation completed Falls evaluation completed Falls evaluation completed   Functional Status Survey:    Vitals:   08/05/22 1135  BP: 122/68  Pulse: (!) 55  Resp: 18  Temp: (!) 96.8 F (36 C)  SpO2: 96%  Weight: 173 lb 12.8 oz (78.8 kg)  Height: '5\' 4"'$  (1.626 m)   Body mass index is 29.83 kg/m. Physical Exam Vitals reviewed.  Constitutional:      General: She is not in acute distress. HENT:     Head: Normocephalic.     Right Ear: There is no impacted cerumen.     Left Ear: There is no impacted cerumen.     Nose: Congestion present.     Mouth/Throat:     Mouth: Mucous membranes are dry.     Pharynx: No oropharyngeal exudate.  Eyes:     General:        Right eye: No discharge.        Left eye: No discharge.  Cardiovascular:     Rate and Rhythm: Normal rate and regular rhythm.     Pulses: Normal pulses.     Heart sounds: Normal heart sounds.  Pulmonary:     Effort: Pulmonary effort is normal. No respiratory distress.     Breath sounds: Normal breath sounds. No wheezing or rales.  Abdominal:     General: Bowel sounds are normal. There is no distension.     Palpations: Abdomen is soft.     Tenderness: There is no abdominal tenderness.  Musculoskeletal:     Cervical back: Neck supple.     Right lower leg: No edema.     Left lower leg: No edema.  Skin:    General: Skin is warm and dry.     Capillary Refill: Capillary refill takes less than 2 seconds.  Neurological:     General: No focal deficit present.     Mental Status: She is alert and  oriented to person, place, and time.  Psychiatric:        Mood and Affect: Mood normal.        Behavior: Behavior normal.     Labs reviewed: Recent Labs    08/12/21 0000 04/03/22 0000 07/03/22 0000  NA 144 150* 142  K 4.4 4.8 4.3  CL 107 114* 106  CO2 27* 23* 27*  BUN '15 18 13  '$ CREATININE 0.6 0.7 0.6  CALCIUM 9.0 10.9* 8.9   Recent Labs    04/03/22 0000  AST 21  ALT 10  ALKPHOS 87  ALBUMIN 3.7   Recent Labs    08/12/21 0000 04/03/22 0000 07/03/22 0000  WBC 3.7 3.7 3.0  HGB 11.9* 12.9 11.9*  HCT 36 39 37  PLT 160 155 144*   Lab Results  Component Value Date   TSH 3.54 04/03/2022   No results found for: "HGBA1C" Lab Results  Component Value Date   CHOL 206 (A) 04/03/2022   HDL 62 04/03/2022   LDLCALC 125 04/03/2022   TRIG 95 04/03/2022   CHOLHDL 3 08/24/2018    Significant Diagnostic Results in last 30 days:  No results found.  Assessment/Plan 1. COVID-19 - rapid covid test positive 08/22 - symptoms: nasal congestion, malaise, fever, headache, and sore throat - exam unremarkable, lung sounds clear - start Paxlovid - isolation precautions x 10 days - nirmatrelvir/ritonavir EUA (PAXLOVID) 20 x 150 MG & 10 x '100MG'$  TABS; Take 3 tablets by mouth 2 (two) times daily for 5 days. (Take nirmatrelvir 150 mg two tablets twice daily for 5 days and ritonavir 100 mg one tablet twice daily for 5 days) Patient GFR is 87.  Dispense: 30 tablet; Refill: 0    Family/ staff Communication: plan discussed with patient and nurse  Labs/tests ordered:  none

## 2022-08-06 ENCOUNTER — Encounter: Payer: Medicare Other | Admitting: Internal Medicine

## 2022-08-11 ENCOUNTER — Other Ambulatory Visit: Payer: Self-pay | Admitting: Adult Health

## 2022-08-11 DIAGNOSIS — G4700 Insomnia, unspecified: Secondary | ICD-10-CM

## 2022-08-11 MED ORDER — ESZOPICLONE 2 MG PO TABS: 2.0000 mg | ORAL_TABLET | Freq: Every day | ORAL | 5 refills | Status: DC

## 2022-08-12 ENCOUNTER — Ambulatory Visit: Payer: Medicare Other | Admitting: Hematology and Oncology

## 2022-08-13 ENCOUNTER — Ambulatory Visit: Payer: Medicare Other | Admitting: Internal Medicine

## 2022-08-20 ENCOUNTER — Non-Acute Institutional Stay: Payer: Medicare Other | Admitting: Internal Medicine

## 2022-08-20 ENCOUNTER — Encounter: Payer: Self-pay | Admitting: Internal Medicine

## 2022-08-20 VITALS — BP 128/74 | HR 74 | Temp 97.7°F | Ht 64.0 in | Wt 173.8 lb

## 2022-08-20 DIAGNOSIS — I10 Essential (primary) hypertension: Secondary | ICD-10-CM

## 2022-08-20 DIAGNOSIS — D61818 Other pancytopenia: Secondary | ICD-10-CM | POA: Diagnosis not present

## 2022-08-20 DIAGNOSIS — E870 Hyperosmolality and hypernatremia: Secondary | ICD-10-CM | POA: Diagnosis not present

## 2022-08-20 DIAGNOSIS — N3946 Mixed incontinence: Secondary | ICD-10-CM | POA: Diagnosis not present

## 2022-08-20 NOTE — Progress Notes (Signed)
Location: [place] of Service:  Clinic (12)  Provider:   Code Status: Full code Goals of Care:     08/20/2022    8:34 AM  Advanced Directives  Does Patient Have a Medical Advance Directive? Yes  Type of Paramedic of [place];Living will  Does patient want to make changes to medical advance directive? No - Patient declined  Copy of [place] in Chart? Yes - validated most recent copy scanned in chart (See row information)     Chief Complaint  Patient presents with   Acute Visit    Follow up Covid    HPI: Patient is a 88 y.o. 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   Follows with Urology for Neuropathic Bladder Continues In And Out Cath  Hypernatremia On Regular Labs Resolved in repeat with increase water intake Recent Covid Infection Feeling better Back to baseline Pancytopenia Mild Christy d/w patient Made referal to Dr Alvy Bimler But now patient is coming to see me today with her son and they both do not see the point of seeing a hematologist and further work-up at this time.  Per patient she feels very anxious.  She also is not interested in bone marrow/further aggressive work-up Urinary incontinence Patient is appointment with urology which has recommended possible Botox injections.  Patient wanted my opinion on. Past Medical History:  Diagnosis Date   Allergy    Anxiety    Arthritis    Bursitis    right leg   Family history of kidney cancer    Family history of lung cancer    Family history of uterine cancer    GERD (gastroesophageal  reflux disease)    History of blood transfusion    History of uterine cancer 01/19/2019   Hyperlipidemia    Hypertension    Insomnia    Non Hodgkin's lymphoma ([place]) 2017-2019   cured   Osteopenia    MILD   Urine retention    Uterine cancer ([place])    at age 81    Past Surgical History:  Procedure Laterality Date   ABDOMINAL HYSTERECTOMY     AGE 74   APPENDECTOMY     AGE 69   BLADDER SURGERY     Catarct Surgery     ECTOPIC PREGNANCY SURGERY     age 68   LAPAROSCOPIC SMALL BOWEL RESECTION     NM PET DX LYMPHOMA  10/2018   IN REMISSION   occular pressure     OTHER SURGICAL HISTORY     REPLACEMENT TOTAL KNEE BILATERAL     TONSILLECTOMY AND ADENOIDECTOMY     age 89 or 72   UTERINE CANCER SURGERY     age 70- located in vagina    Allergies  Allergen Reactions   Cefuroxime    Clindamycin/Lincomycin    Levofloxacin    Lisinopril    Prochlorperazine Edisylate    Psyllium    Sulfa Antibiotics     Outpatient Encounter Medications as of 08/20/2022  Medication Sig   Acetaminophen 500 MG capsule Take one tablet by mouth in the  morning as needed and Take one tablet by mouth in the evening as needed.   bimatoprost (LUMIGAN) 0.01 % SOLN Place 1 drop into both eyes at bedtime.   brimonidine (ALPHAGAN) 0.15 % ophthalmic solution Place 1 drop into both eyes 3 (three) times daily.   cetirizine (ZYRTEC) 10 MG tablet Take 10 mg by mouth at bedtime.    eszopiclone (LUNESTA) 2 MG TABS tablet Take 1 tablet (2 mg total) by mouth at bedtime. Take immediately before bedtime   methylcellulose (CITRUCEL) oral powder Take 0.5 packets by mouth daily.   nitrofurantoin (MACRODANTIN) 50 MG capsule Take 1 capsule (50 mg total) by mouth daily.   [DISCONTINUED] Propylene Glycol (LUBRICANT EYE DROPS) 0.6 % SOLN Apply to eye as needed.   No facility-administered encounter medications on file as of 08/20/2022.    Review of Systems:  Review of Systems  Constitutional:  Negative for activity change and  appetite change.  HENT: Negative.    Respiratory:  Negative for cough and shortness of breath.   Cardiovascular:  Negative for leg swelling.  Gastrointestinal:  Negative for constipation.  Genitourinary: Negative.   Musculoskeletal:  Negative for arthralgias, gait problem and myalgias.  Skin: Negative.   Neurological:  Negative for dizziness and weakness.  Psychiatric/Behavioral:  Negative for confusion, dysphoric mood and sleep disturbance.     Health Maintenance  Topic Date Due   COVID-19 Vaccine (6 - Moderna risk series) 11/06/2022 (Originally 06/25/2022)   INFLUENZA VACCINE  11/13/2022 (Originally 07/15/2022)   Pneumonia Vaccine 63+ Years old  Completed   DEXA SCAN  Completed   Zoster Vaccines- Shingrix  Completed   HPV VACCINES  Aged Out   TETANUS/TDAP  Discontinued    Physical Exam: Vitals:   08/20/22 0829  BP: 128/74  Pulse: 74  Temp: 97.7 F (36.5 C)  TempSrc: Skin  SpO2: 96%  Weight: 173 lb 12.8 oz (78.8 kg)  Height: '5\' 4"'$  (1.626 m)   Body mass index is 29.83 kg/m. Physical Exam Vitals reviewed.  Constitutional:      Appearance: Normal appearance.  HENT:     Head: Normocephalic.     Nose: Nose normal.     Mouth/Throat:     Mouth: Mucous membranes are moist.     Pharynx: Oropharynx is clear.  Eyes:     Pupils: Pupils are equal, round, and reactive to light.  Cardiovascular:     Rate and Rhythm: Normal rate and regular rhythm.     Pulses: Normal pulses.     Heart sounds: Normal heart sounds. No murmur heard. Pulmonary:     Effort: Pulmonary effort is normal.     Breath sounds: Normal breath sounds.  Abdominal:     General: Abdomen is flat. Bowel sounds are normal.     Palpations: Abdomen is soft.  Musculoskeletal:        General: No swelling.     Cervical back: Neck supple.  Skin:    General: Skin is warm.  Neurological:     General: No focal deficit present.     Mental Status: She is alert and oriented to person, place, and time.  Psychiatric:         Mood and Affect: Mood normal.        Thought Content: Thought content normal.     Labs reviewed: Basic Metabolic Panel: Recent Labs    04/03/22 0000 07/03/22 0000  NA 150* 142  K 4.8 4.3  CL 114* 106  CO2 23* 27*  BUN 18  13  CREATININE 0.7 0.6  CALCIUM 10.9* 8.9  TSH 3.54  --    Liver Function Tests: Recent Labs    04/03/22 0000  AST 21  ALT 10  ALKPHOS 87  ALBUMIN 3.7   No results for input(s): "LIPASE", "AMYLASE" in the last 8760 hours. No results for input(s): "AMMONIA" in the last 8760 hours. CBC: Recent Labs    04/03/22 0000 07/03/22 0000  WBC 3.7 3.0  HGB 12.9 11.9*  HCT 39 37  PLT 155 144*   Lipid Panel: Recent Labs    04/03/22 0000  CHOL 206*  HDL 62  LDLCALC 125  TRIG 95   No results found for: "HGBA1C"  Procedures since last visit: No results found.  Assessment/Plan 1. Pancytopenia ([place]) Not much change from Labs from 2020 At t his time it is appropriate to just monitor her labs Will repeat in 3 months If any decline will reappointment with Dr Alvy Bimler Patient does not want to follow with hematology at this time  2. Hypertension, essential, benign Losartan Discontinued last visit at her request Her BP is in Good range today  3. Hypernatremia Repeat Labs were normal D/w Patient to keep up with her water intake She uses In and Out cath and tries to avoid water due to this  4. Mixed stress and urge urinary incontinence Plans to see Urology next week and discuss Botox    Labs/tests ordered:  CBC,CMP,TSH,Lipid Next appt:  10/01/2022

## 2022-08-21 ENCOUNTER — Inpatient Hospital Stay: Payer: Medicare Other | Admitting: Hematology and Oncology

## 2022-09-25 DIAGNOSIS — E618 Deficiency of other specified nutrient elements: Secondary | ICD-10-CM | POA: Diagnosis not present

## 2022-09-25 DIAGNOSIS — E870 Hyperosmolality and hypernatremia: Secondary | ICD-10-CM | POA: Diagnosis not present

## 2022-09-25 LAB — CBC AND DIFFERENTIAL
HCT: 39 (ref 36–46)
Hemoglobin: 13.1 (ref 12.0–16.0)
Platelets: 188 10*3/uL (ref 150–400)
WBC: 4.2

## 2022-09-25 LAB — CBC: RBC: 4.3 (ref 3.87–5.11)

## 2022-09-29 ENCOUNTER — Other Ambulatory Visit: Payer: Self-pay | Admitting: Internal Medicine

## 2022-09-30 ENCOUNTER — Encounter: Payer: Self-pay | Admitting: Internal Medicine

## 2022-09-30 ENCOUNTER — Non-Acute Institutional Stay: Payer: Medicare Other | Admitting: Internal Medicine

## 2022-09-30 VITALS — BP 134/76 | HR 84 | Temp 97.8°F | Resp 18 | Ht 64.0 in | Wt 178.0 lb

## 2022-09-30 DIAGNOSIS — N3946 Mixed incontinence: Secondary | ICD-10-CM | POA: Diagnosis not present

## 2022-09-30 DIAGNOSIS — D61818 Other pancytopenia: Secondary | ICD-10-CM | POA: Diagnosis not present

## 2022-09-30 DIAGNOSIS — E870 Hyperosmolality and hypernatremia: Secondary | ICD-10-CM | POA: Diagnosis not present

## 2022-09-30 DIAGNOSIS — I10 Essential (primary) hypertension: Secondary | ICD-10-CM | POA: Diagnosis not present

## 2022-09-30 DIAGNOSIS — E785 Hyperlipidemia, unspecified: Secondary | ICD-10-CM | POA: Diagnosis not present

## 2022-09-30 NOTE — Progress Notes (Signed)
Location:  [place] of Service:  Clinic (12)  Provider:   Code Status: DNR Goals of Care:     09/30/2022    8:44 AM  Advanced Directives  Does Patient Have a Medical Advance Directive? Yes  Type of Advance Directive [place] in Chart? Yes - validated most recent copy scanned in chart (See row information)     Chief Complaint  Patient presents with   Medical Management of Chronic Issues    3 Month Follow up   Bleeding/Bruising    Patient has some bruising on her arms she would like to discuss     HPI: Patient is a 88 y.o. female seen today for medical management of chronic diseases.    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   Follows with Urology for Neuropathic Bladder Continues In And Out Cath  Hypernatremia Resolved with increasing water intake Pancytopenia Resolved in recent Lab work Urinary Incontinence Refused Botox treatment by urology for now  Mild bruises on her arms but no other issues Walks with no assist No falls Wt Readings from Last 3 Encounters:  09/30/22 178 lb (80.7 kg)  08/20/22 173 lb 12.8 oz (78.8 kg)  08/05/22 173 lb 12.8 oz (78.8 kg)    Past Medical History:  Diagnosis Date   Allergy    Anxiety    Arthritis    Bursitis    right leg   Family history of kidney cancer    Family history of lung cancer    Family history of uterine cancer    GERD (gastroesophageal reflux disease)    History of blood transfusion    History of uterine cancer 01/19/2019   Hyperlipidemia    Hypertension    Insomnia    Non Hodgkin's lymphoma ([place]) 2017-2019   cured    Osteopenia    MILD   Urine retention    Uterine cancer ([place])    at age 32    Past Surgical History:  Procedure Laterality Date   ABDOMINAL HYSTERECTOMY     AGE 86   APPENDECTOMY     AGE 28   BLADDER SURGERY     Catarct Surgery     ECTOPIC PREGNANCY SURGERY     age 78   LAPAROSCOPIC SMALL BOWEL RESECTION     NM PET DX LYMPHOMA  10/2018   IN REMISSION   occular pressure     OTHER SURGICAL HISTORY     REPLACEMENT TOTAL KNEE BILATERAL     TONSILLECTOMY AND ADENOIDECTOMY     age 76 or 46   UTERINE CANCER SURGERY     age 64- located in vagina    Allergies  Allergen Reactions   Cefuroxime    Clindamycin/Lincomycin    Levofloxacin    Lisinopril    Prochlorperazine Edisylate    Psyllium    Sulfa Antibiotics     Outpatient Encounter Medications as of 09/30/2022  Medication Sig   Acetaminophen 500 MG capsule Take one tablet by mouth in the morning as needed and Take one tablet by mouth in the evening as needed.   bimatoprost (LUMIGAN)  0.01 % SOLN Place 1 drop into both eyes at bedtime.   brimonidine (ALPHAGAN) 0.15 % ophthalmic solution Place 1 drop into both eyes 3 (three) times daily.   cetirizine (ZYRTEC) 10 MG tablet Take 10 mg by mouth at bedtime.    eszopiclone (LUNESTA) 2 MG TABS tablet Take 1 tablet (2 mg total) by mouth at bedtime. Take immediately before bedtime   methylcellulose (CITRUCEL) oral powder Take 0.5 packets by mouth daily.   nitrofurantoin (MACRODANTIN) 50 MG capsule Take 1 capsule (50 mg total) by mouth daily.   No facility-administered encounter medications on file as of 09/30/2022.    Review of Systems:  Review of Systems  Constitutional:  Negative for activity change and appetite change.  HENT: Negative.    Respiratory:  Negative for cough and shortness of breath.   Cardiovascular:  Negative for leg swelling.  Gastrointestinal:  Negative for constipation.  Genitourinary:  Positive for difficulty urinating.  Musculoskeletal:  Negative for  arthralgias, gait problem and myalgias.  Skin: Negative.   Neurological:  Negative for dizziness and weakness.  Psychiatric/Behavioral:  Negative for confusion, dysphoric mood and sleep disturbance.     Health Maintenance  Topic Date Due   COVID-19 Vaccine (6 - Moderna risk series) 11/06/2022 (Originally 06/25/2022)   INFLUENZA VACCINE  11/13/2022 (Originally 07/15/2022)   Pneumonia Vaccine 67+ Years old  Completed   DEXA SCAN  Completed   Zoster Vaccines- Shingrix  Completed   HPV VACCINES  Aged Out   TETANUS/TDAP  Discontinued    Physical Exam: Vitals:   09/30/22 0842  BP: 134/76  Pulse: 84  Resp: 18  Temp: 97.8 F (36.6 C)  TempSrc: Temporal  SpO2: 96%  Weight: 178 lb (80.7 kg)  Height: '5\' 4"'$  (1.626 m)   Body mass index is 30.55 kg/m. Physical Exam Vitals reviewed.  Constitutional:      Appearance: Normal appearance.  HENT:     Head: Normocephalic.     Nose: Nose normal.     Mouth/Throat:     Mouth: Mucous membranes are moist.     Pharynx: Oropharynx is clear.  Eyes:     Pupils: Pupils are equal, round, and reactive to light.  Cardiovascular:     Rate and Rhythm: Normal rate and regular rhythm.     Pulses: Normal pulses.     Heart sounds: Normal heart sounds. No murmur heard. Pulmonary:     Effort: Pulmonary effort is normal.     Breath sounds: Normal breath sounds.  Abdominal:     General: Abdomen is flat. Bowel sounds are normal.     Palpations: Abdomen is soft.  Musculoskeletal:        General: No swelling.     Cervical back: Neck supple.  Skin:    General: Skin is warm.  Neurological:     General: No focal deficit present.     Mental Status: She is alert and oriented to person, place, and time.  Psychiatric:        Mood and Affect: Mood normal.        Thought Content: Thought content normal.     Labs reviewed: Basic Metabolic Panel: Recent Labs    04/03/22 0000 07/03/22 0000  NA 150* 142  K 4.8 4.3  CL 114* 106  CO2 23* 27*  BUN 18 13   CREATININE 0.7 0.6  CALCIUM 10.9* 8.9  TSH 3.54  --    Liver Function Tests: Recent Labs    04/03/22 0000  AST 21  ALT 10  ALKPHOS 87  ALBUMIN 3.7   No results for input(s): "LIPASE", "AMYLASE" in the last 8760 hours. No results for input(s): "AMMONIA" in the last 8760 hours. CBC: Recent Labs    04/03/22 0000 07/03/22 0000 09/25/22 0000  WBC 3.7 3.0 4.2  HGB 12.9 11.9* 13.1  HCT 39 37 39  PLT 155 144* 188   Lipid Panel: Recent Labs    04/03/22 0000  CHOL 206*  HDL 62  LDLCALC 125  TRIG 95   No results found for: "HGBA1C"  Procedures since last visit: No results found.  Assessment/Plan 1. Hypertension, essential, benign Off losartan BP controlled  2. Pancytopenia ([place]) Right now All look good Repeat CBC in 3 months  3. Hypernatremia Resolved  Repeat BMP in 3 months  4. Mixed stress and urge urinary incontinence Uses In and out cath  5. Hyperlipidemia, unspecified hyperlipidemia type Refuses statin    Labs/tests ordered:  CBC,CMP in 3 months Next appt:  01/12/2023

## 2022-10-01 ENCOUNTER — Encounter: Payer: Medicare Other | Admitting: Internal Medicine

## 2022-10-02 DIAGNOSIS — H47021 Hemorrhage in optic nerve sheath, right eye: Secondary | ICD-10-CM | POA: Diagnosis not present

## 2022-11-05 DIAGNOSIS — Z23 Encounter for immunization: Secondary | ICD-10-CM | POA: Diagnosis not present

## 2022-12-12 ENCOUNTER — Other Ambulatory Visit: Payer: Self-pay | Admitting: Adult Health

## 2022-12-12 DIAGNOSIS — G4700 Insomnia, unspecified: Secondary | ICD-10-CM

## 2022-12-12 MED ORDER — ESZOPICLONE 2 MG PO TABS: 2.0000 mg | ORAL_TABLET | Freq: Every day | ORAL | 5 refills | Status: DC

## 2023-01-06 DIAGNOSIS — D61818 Other pancytopenia: Secondary | ICD-10-CM | POA: Diagnosis not present

## 2023-01-06 DIAGNOSIS — E785 Hyperlipidemia, unspecified: Secondary | ICD-10-CM | POA: Diagnosis not present

## 2023-01-06 DIAGNOSIS — I10 Essential (primary) hypertension: Secondary | ICD-10-CM | POA: Diagnosis not present

## 2023-01-06 LAB — HEPATIC FUNCTION PANEL
ALT: 17 U/L (ref 7–35)
AST: 31 (ref 13–35)
Alkaline Phosphatase: 89 (ref 25–125)
Bilirubin, Total: 0.4

## 2023-01-06 LAB — CBC AND DIFFERENTIAL
HCT: 42 (ref 36–46)
Hemoglobin: 13.9 (ref 12.0–16.0)
Neutrophils Absolute: 2.7
Platelets: 204 10*3/uL (ref 150–400)
WBC: 5.9

## 2023-01-06 LAB — BASIC METABOLIC PANEL
BUN: 9 (ref 4–21)
CO2: 22 (ref 13–22)
Chloride: 101 (ref 99–108)
Creatinine: 0.7 (ref 0.5–1.1)
Glucose: 80
Potassium: 4.1 mEq/L (ref 3.5–5.1)
Sodium: 138 (ref 137–147)

## 2023-01-06 LAB — COMPREHENSIVE METABOLIC PANEL
Albumin: 4.2 (ref 3.5–5.0)
Calcium: 9.7 (ref 8.7–10.7)
Globulin: 2.5
eGFR: 83

## 2023-01-06 LAB — CBC: RBC: 4.59 (ref 3.87–5.11)

## 2023-01-06 LAB — LIPID PANEL
Cholesterol: 242 — AB (ref 0–200)
HDL: 65 (ref 35–70)
LDL Cholesterol: 144
Triglycerides: 166 — AB (ref 40–160)

## 2023-01-11 NOTE — Progress Notes (Unsigned)
Location:  [facility]  POS: Clinic  Provider: Royal Hawthorn, ANP  Code Status: Full Goals of Care:     09/30/2022    8:44 AM  Advanced Directives  Does Patient Have a Medical Advance Directive? Yes  Type of Advance Directive [place] in Chart? Yes - validated most recent copy scanned in chart (See row information)     No chief complaint on file.   HPI: Patient is a 89 y.o. female seen today for medical management of chronic diseases.    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   Previously had pancytopenia which resolved.   MMSE 02/01/21 30/30 passed clock    Tapered off statin last year per pt request LDL 125  OP: 10/04/19: T score -2.1 showing osteopenia with prior hx of compression fx.  Has chosen not to pursue medication for this in the past   Mammogram: aged out  Colonoscopy:   Past Medical History:  Diagnosis Date   Allergy    Anxiety    Arthritis    Bursitis    right leg   Family history of kidney cancer    Family history of lung cancer    Family history of uterine cancer    GERD (gastroesophageal reflux disease)    History of blood transfusion    History of uterine cancer 01/19/2019   Hyperlipidemia    Hypertension    Insomnia    Non Hodgkin's lymphoma ([place]) 2017-2019   cured   Osteopenia    MILD   Urine retention    Uterine cancer ([place])    at age 52    Past Surgical History:  Procedure Laterality Date   ABDOMINAL HYSTERECTOMY     AGE 27   APPENDECTOMY     AGE 65   BLADDER SURGERY     Catarct Surgery     ECTOPIC PREGNANCY SURGERY     age 70   LAPAROSCOPIC SMALL BOWEL RESECTION     NM PET DX LYMPHOMA  10/2018   IN REMISSION   occular pressure     OTHER SURGICAL  HISTORY     REPLACEMENT TOTAL KNEE BILATERAL     TONSILLECTOMY AND ADENOIDECTOMY     age 53 or 75   UTERINE CANCER SURGERY     age 67- located in vagina    Allergies  Allergen Reactions   Cefuroxime    Clindamycin/Lincomycin    Levofloxacin    Lisinopril    Prochlorperazine Edisylate    Psyllium    Sulfa Antibiotics     Outpatient Encounter Medications as of 01/12/2023  Medication Sig   Acetaminophen 500 MG capsule Take one tablet by mouth in the morning as needed and Take one tablet by mouth in the evening as needed.   bimatoprost (LUMIGAN) 0.01 % SOLN Place 1 drop into both eyes at bedtime.   brimonidine (ALPHAGAN) 0.15 % ophthalmic solution Place 1 drop into both eyes 3 (three) times daily.   cetirizine (ZYRTEC) 10 MG tablet Take 10 mg by mouth at bedtime.    eszopiclone (LUNESTA) 2 MG TABS tablet Take 1 tablet (2 mg total) by mouth at bedtime. Take immediately before bedtime   methylcellulose (CITRUCEL) oral powder Take 0.5 packets by mouth daily.   nitrofurantoin (MACRODANTIN) 50 MG capsule  Take 1 capsule (50 mg total) by mouth daily.   No facility-administered encounter medications on file as of 01/12/2023.    Review of Systems:  Review of Systems  Constitutional:  Negative for activity change, appetite change, chills, diaphoresis, fatigue, fever and unexpected weight change.  HENT:  Positive for hearing loss. Negative for congestion.   Respiratory:  Negative for cough, shortness of breath and wheezing.   Cardiovascular:  Positive for leg swelling. Negative for chest pain and palpitations.  Gastrointestinal:  Negative for abdominal distention, abdominal pain, constipation and diarrhea.  Genitourinary:  Negative for difficulty urinating and dysuria.  Musculoskeletal:  Negative for arthralgias, back pain, gait problem, joint swelling and myalgias.  Neurological:  Negative for dizziness, tremors, seizures, syncope, facial asymmetry, speech difficulty, weakness,  light-headedness, numbness and headaches.  Psychiatric/Behavioral:  Negative for agitation, behavioral problems and confusion.     Health Maintenance  Topic Date Due   DTaP/Tdap/Td (2 - Td or Tdap) 05/23/2020   Medicare Annual Wellness (AWV)  10/18/2020   INFLUENZA VACCINE  07/15/2022   COVID-19 Vaccine (6 - 2023-24 season) 08/15/2022   Pneumonia Vaccine 60+ Years old  Completed   DEXA SCAN  Completed   Zoster Vaccines- Shingrix  Completed   HPV VACCINES  Aged Out    Physical Exam: There were no vitals filed for this visit.  There is no height or weight on file to calculate BMI. Physical Exam Vitals and nursing note reviewed.  Constitutional:      General: She is not in acute distress.    Appearance: She is not diaphoretic.  HENT:     Head: Normocephalic and atraumatic.     Right Ear: Tympanic membrane normal.     Left Ear: Tympanic membrane normal.     Nose: Nose normal.     Mouth/Throat:     Mouth: Mucous membranes are moist.     Pharynx: Oropharynx is clear. No oropharyngeal exudate.  Neck:     Vascular: No JVD.  Cardiovascular:     Rate and Rhythm: Normal rate and regular rhythm.     Heart sounds: No murmur heard. Pulmonary:     Effort: Pulmonary effort is normal. No respiratory distress.     Breath sounds: Normal breath sounds. No wheezing.  Abdominal:     General: Bowel sounds are normal. There is no distension.     Palpations: Abdomen is soft. There is no mass.     Tenderness: There is no abdominal tenderness. There is no guarding.  Musculoskeletal:        General: No swelling, tenderness, deformity or signs of injury.     Cervical back: No rigidity or tenderness.     Comments: Trace edema bilat  Lymphadenopathy:     Cervical: No cervical adenopathy.  Skin:    General: Skin is warm and dry.  Neurological:     Mental Status: She is alert and oriented to person, place, and time.  Psychiatric:        Mood and Affect: Mood normal.     Labs  reviewed: Basic Metabolic Panel: Recent Labs    04/03/22 0000 07/03/22 0000 01/06/23 0000  NA 150* 142 138  K 4.8 4.3 4.1  CL 114* 106 101  CO2 23* 27* 22  BUN '18 13 9  '$ CREATININE 0.7 0.6 0.7  CALCIUM 10.9* 8.9 9.7  TSH 3.54  --   --     Liver Function Tests: Recent Labs    04/03/22 0000 01/06/23 0000  AST  21 31  ALT 10 17  ALKPHOS 87 89  ALBUMIN 3.7 4.2    No results for input(s): "LIPASE", "AMYLASE" in the last 8760 hours. No results for input(s): "AMMONIA" in the last 8760 hours. CBC: Recent Labs    07/03/22 0000 09/25/22 0000 01/06/23 0000  WBC 3.0 4.2 5.9  NEUTROABS  --   --  2.70  HGB 11.9* 13.1 13.9  HCT 37 39 42  PLT 144* 188 204    Lipid Panel: Recent Labs    04/03/22 0000 01/06/23 0000  CHOL 206* 242*  HDL 62 65  LDLCALC 125 144  TRIG 95 166*    No results found for: "HGBA1C"  Procedures since last visit: No results found.  Assessment/Plan  1. Hypertension, essential, benign  2. Osteoporosis with pathological fracture, sequela Does not want to pursue dexa scan or treatment Continue weight bearing exercise Appropriate calcium intake     3. Incomplete emptying of bladder Self caths with prior hx of vaginal vault surgery due to ca Takes macrodantin daily for UTI prevention  4. Mixed hyperlipidemia   6. Sensorineural hearing loss (SNHL) of both ears Wears hearing aides.   7. Leukopenia, unspecified type   Labs/tests ordered:  * No order type specified *  Next appt:  4 months with Dr Lyndel Safe   Total time 75mn:  time greater than 50% of total time spent doing pt counseling and coordination of care

## 2023-01-12 ENCOUNTER — Encounter: Payer: Self-pay | Admitting: Adult Health

## 2023-01-12 ENCOUNTER — Non-Acute Institutional Stay: Payer: Medicare Other | Admitting: Adult Health

## 2023-01-12 VITALS — BP 128/82 | HR 71 | Resp 17 | Ht 64.0 in | Wt 177.2 lb

## 2023-01-12 DIAGNOSIS — F5101 Primary insomnia: Secondary | ICD-10-CM

## 2023-01-12 DIAGNOSIS — M85851 Other specified disorders of bone density and structure, right thigh: Secondary | ICD-10-CM

## 2023-01-12 DIAGNOSIS — K5901 Slow transit constipation: Secondary | ICD-10-CM

## 2023-01-12 DIAGNOSIS — I10 Essential (primary) hypertension: Secondary | ICD-10-CM

## 2023-01-12 DIAGNOSIS — E782 Mixed hyperlipidemia: Secondary | ICD-10-CM | POA: Diagnosis not present

## 2023-01-12 DIAGNOSIS — M85852 Other specified disorders of bone density and structure, left thigh: Secondary | ICD-10-CM

## 2023-01-12 MED ORDER — PRAVASTATIN SODIUM 20 MG PO TABS: 20.0000 mg | ORAL_TABLET | Freq: Every day | ORAL | 0 refills | Status: DC

## 2023-01-16 DIAGNOSIS — H401131 Primary open-angle glaucoma, bilateral, mild stage: Secondary | ICD-10-CM | POA: Diagnosis not present

## 2023-01-16 DIAGNOSIS — Z961 Presence of intraocular lens: Secondary | ICD-10-CM | POA: Diagnosis not present

## 2023-03-02 ENCOUNTER — Telehealth: Payer: Medicare Other

## 2023-03-02 NOTE — Telephone Encounter (Signed)
Patient left message on clinical intake voicemail and she wants to know if she should get another COVID vaccine.  Message routed to Dr. Veleta Miners

## 2023-03-04 NOTE — Telephone Encounter (Signed)
Let her know that I will tell [facility] nurse to give her another [employer]

## 2023-03-04 NOTE — Telephone Encounter (Signed)
Patient made aware of response and she said that she will go to [facility] to get vaccine. She was informed by the nurse that cam to her apartment of Dr. Steve Rattler reply.

## 2023-03-06 DIAGNOSIS — Z23 Encounter for immunization: Secondary | ICD-10-CM | POA: Diagnosis not present

## 2023-04-09 ENCOUNTER — Other Ambulatory Visit: Payer: Self-pay | Admitting: Adult Health

## 2023-04-09 DIAGNOSIS — G4700 Insomnia, unspecified: Secondary | ICD-10-CM

## 2023-04-14 ENCOUNTER — Non-Acute Institutional Stay: Payer: Medicare Other | Admitting: Internal Medicine

## 2023-04-14 ENCOUNTER — Encounter: Payer: Self-pay | Admitting: Internal Medicine

## 2023-04-14 VITALS — BP 138/84 | HR 60 | Temp 98.0°F | Resp 17 | Ht 64.0 in | Wt 180.0 lb

## 2023-04-14 DIAGNOSIS — E785 Hyperlipidemia, unspecified: Secondary | ICD-10-CM | POA: Diagnosis not present

## 2023-04-14 DIAGNOSIS — M8580 Other specified disorders of bone density and structure, unspecified site: Secondary | ICD-10-CM

## 2023-04-14 DIAGNOSIS — I10 Essential (primary) hypertension: Secondary | ICD-10-CM

## 2023-04-14 DIAGNOSIS — E782 Mixed hyperlipidemia: Secondary | ICD-10-CM | POA: Diagnosis not present

## 2023-04-14 DIAGNOSIS — F5101 Primary insomnia: Secondary | ICD-10-CM

## 2023-04-14 DIAGNOSIS — N3946 Mixed incontinence: Secondary | ICD-10-CM

## 2023-04-14 DIAGNOSIS — K5901 Slow transit constipation: Secondary | ICD-10-CM

## 2023-04-14 DIAGNOSIS — Z79899 Other long term (current) drug therapy: Secondary | ICD-10-CM | POA: Diagnosis not present

## 2023-04-14 LAB — HEPATIC FUNCTION PANEL
ALT: 14 U/L (ref 7–35)
AST: 31 (ref 13–35)
Alkaline Phosphatase: 91 (ref 25–125)
Bilirubin, Direct: 0.2
Bilirubin, Total: 0.4

## 2023-04-14 LAB — LIPID PANEL
Cholesterol: 189 (ref 0–200)
HDL: 67 (ref 35–70)
LDL Cholesterol: 102
LDl/HDL Ratio: 2.8
Triglycerides: 100 (ref 40–160)

## 2023-04-14 NOTE — Progress Notes (Unsigned)
Location:  [facility] features editor of Service:  Clinic (12)  Provider:   Code Status: *** Goals of Care:     04/14/2023    2:30 PM  Advanced Directives  Does Patient Have a Medical Advance Directive? Yes  Type of Estate agent of [place];Living will  Does patient want to make changes to medical advance directive? No - Patient declined  Copy of Healthcare Power of Attorney in Chart? No - copy requested     Chief Complaint  Patient presents with   Medical Management of Chronic Issues    3 month follow up.patient wants to discuss medication   Quality Metric Gaps    Discuss the need for AWV    HPI: Patient is a 89 y.o. female seen today for medical management of chronic diseases.     Past Medical History:  Diagnosis Date   Allergy    Anxiety    Arthritis    Bursitis    right leg   Family history of kidney cancer    Family history of lung cancer    Family history of uterine cancer    GERD (gastroesophageal reflux disease)    History of blood transfusion    History of uterine cancer 01/19/2019   Hyperlipidemia    Hypertension    Insomnia    Non Hodgkin's lymphoma (HCC) 2017-2019   cured   Osteopenia    MILD   Urine retention    Uterine cancer (HCC)    at age 12    Past Surgical History:  Procedure Laterality Date   ABDOMINAL HYSTERECTOMY     AGE 47   APPENDECTOMY     AGE 58   BLADDER SURGERY     Catarct Surgery     ECTOPIC PREGNANCY SURGERY     age 56   LAPAROSCOPIC SMALL BOWEL RESECTION     NM PET DX LYMPHOMA  10/2018   IN REMISSION   occular pressure     OTHER SURGICAL HISTORY     REPLACEMENT TOTAL KNEE BILATERAL     TONSILLECTOMY AND ADENOIDECTOMY     age 19 or 86   UTERINE CANCER SURGERY     age 77- located in vagina    Allergies  Allergen Reactions   Cefuroxime    Clindamycin/Lincomycin    Levofloxacin    Lisinopril    Prochlorperazine Edisylate    Psyllium    Sulfa Antibiotics     Outpatient  Encounter Medications as of 04/14/2023  Medication Sig   Acetaminophen 500 MG capsule Take one tablet by mouth in the morning as needed and Take one tablet by mouth in the evening as needed.   bimatoprost (LUMIGAN) 0.01 % SOLN Place 1 drop into both eyes at bedtime.   brimonidine (ALPHAGAN) 0.15 % ophthalmic solution Place 1 drop into both eyes 3 (three) times daily.   cetirizine (ZYRTEC) 10 MG tablet Take 10 mg by mouth at bedtime.    eszopiclone (LUNESTA) 2 MG TABS tablet TAKE 1 TABLET BY MOUTH IMMEDIATELY BEFORE BEDTIME (CONTROL)   methylcellulose (CITRUCEL) oral powder Take 0.5 packets by mouth daily.   nitrofurantoin (MACRODANTIN) 50 MG capsule Take 1 capsule (50 mg total) by mouth daily.   pravastatin (PRAVACHOL) 20 MG tablet Take 1 tablet (20 mg total) by mouth daily.   No facility-administered encounter medications on file as of 04/14/2023.    Review of Systems:  Review of Systems  Health Maintenance  Topic Date Due   Medicare  Annual Wellness (AWV)  10/18/2020   COVID-19 Vaccine (7 - 2023-24 season) 05/01/2023   INFLUENZA VACCINE  07/16/2023   DTaP/Tdap/Td (3 - Td or Tdap) 05/21/2030   Pneumonia Vaccine 69+ Years old  Completed   DEXA SCAN  Completed   Zoster Vaccines- Shingrix  Completed   HPV VACCINES  Aged Out    Physical Exam: Vitals:   04/14/23 1433  BP: 138/84  Pulse: 60  Resp: 17  Temp: 98 F (36.7 C)  TempSrc: Temporal  SpO2: 97%  Weight: 180 lb (81.6 kg)  Height: 5\' 4"  (1.626 m)   Body mass index is 30.9 kg/m. Physical Exam  Labs reviewed: Basic Metabolic Panel: Recent Labs    07/03/22 0000 01/06/23 0000  NA 142 138  K 4.3 4.1  CL 106 101  CO2 27* 22  BUN 13 9  CREATININE 0.6 0.7  CALCIUM 8.9 9.7   Liver Function Tests: Recent Labs    01/06/23 0000  AST 31  ALT 17  ALKPHOS 89  ALBUMIN 4.2   No results for input(s): "LIPASE", "AMYLASE" in the last 8760 hours. No results for input(s): "AMMONIA" in the last 8760 hours. CBC: Recent  Labs    07/03/22 0000 09/25/22 0000 01/06/23 0000  WBC 3.0 4.2 5.9  NEUTROABS  --   --  2.70  HGB 11.9* 13.1 13.9  HCT 37 39 42  PLT 144* 188 204   Lipid Panel: Recent Labs    01/06/23 0000  CHOL 242*  HDL 65  LDLCALC 144  TRIG 604*   No results found for: "HGBA1C"  Procedures since last visit: No results found.  Assessment/Plan There are no diagnoses linked to this encounter.   Labs/tests ordered:  * No order type specified * Next appt:  Visit date not found

## 2023-05-01 DIAGNOSIS — H401131 Primary open-angle glaucoma, bilateral, mild stage: Secondary | ICD-10-CM | POA: Diagnosis not present

## 2023-05-19 ENCOUNTER — Encounter: Payer: Self-pay | Admitting: Internal Medicine

## 2023-05-19 ENCOUNTER — Non-Acute Institutional Stay: Payer: Medicare Other | Admitting: Internal Medicine

## 2023-05-19 VITALS — BP 134/76 | HR 60 | Temp 97.7°F | Resp 17 | Ht 64.0 in | Wt 177.6 lb

## 2023-05-19 DIAGNOSIS — R600 Localized edema: Secondary | ICD-10-CM | POA: Diagnosis not present

## 2023-05-19 DIAGNOSIS — R2681 Unsteadiness on feet: Secondary | ICD-10-CM | POA: Diagnosis not present

## 2023-05-19 NOTE — Progress Notes (Signed)
Location: [facility] of Service:  Clinic (12)  Provider:   Code Status:  Goals of Care:     05/19/2023    8:47 AM  Advanced Directives  Does Patient Have a Medical Advance Directive? Yes  Type of Estate agent of [place];Living will  Does patient want to make changes to medical advance directive? No - Patient declined  Copy of Healthcare Power of Attorney in Chart? No - copy requested     Chief Complaint  Patient presents with   Acute Visit    Patient is being seen for Edema in both ankles    Quality Metric Gaps    Patient is due for a AWV and updated covid vaccine     HPI: Patient is a 89 y.o. female seen today for an acute visit for LE edema  Lives in [place]  Recently she was on her feet for long periods of time and Noticed some swelling in her Legs Nurse in [place] Suggested elevation and ted hoses and she says her legs are back to normal. 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   Follows with Urology for Neuropathic Bladder They recommended Botox which she refused  Continues In And Out Cath  Past Medical History:  Diagnosis Date   Allergy    Anxiety    Arthritis    Bursitis    right leg   Family history of kidney cancer    Family history of lung cancer    Family history of uterine cancer    GERD (gastroesophageal reflux disease)    History of blood transfusion    History of uterine cancer 01/19/2019   Hyperlipidemia    Hypertension     Insomnia    Non Hodgkin's lymphoma (HCC) 2017-2019   cured   Osteopenia    MILD   Urine retention    Uterine cancer (HCC)    at age 11    Past Surgical History:  Procedure Laterality Date   ABDOMINAL HYSTERECTOMY     AGE 45   APPENDECTOMY     AGE 29   BLADDER SURGERY     Catarct Surgery     ECTOPIC PREGNANCY SURGERY     age 32   LAPAROSCOPIC SMALL BOWEL RESECTION     NM PET DX LYMPHOMA  10/2018   IN REMISSION   occular pressure     OTHER SURGICAL HISTORY     REPLACEMENT TOTAL KNEE BILATERAL     TONSILLECTOMY AND ADENOIDECTOMY     age 52 or 22   UTERINE CANCER SURGERY     age 83- located in vagina    Allergies  Allergen Reactions   Cefuroxime    Clindamycin/Lincomycin    Levofloxacin    Lisinopril    Prochlorperazine Edisylate    Psyllium    Sulfa Antibiotics     Outpatient Encounter Medications as of 05/19/2023  Medication Sig   Acetaminophen 500 MG capsule Take one tablet by mouth in  the morning as needed and Take one tablet by mouth in the evening as needed. (Patient taking differently: Take one tablet by mouth in the morning as needed and Take one tablet by mouth in the evening)   bimatoprost (LUMIGAN) 0.01 % SOLN Place 1 drop into both eyes at bedtime.   brimonidine (ALPHAGAN) 0.15 % ophthalmic solution Place 1 drop into both eyes 3 (three) times daily.   eszopiclone (LUNESTA) 2 MG TABS tablet TAKE 1 TABLET BY MOUTH IMMEDIATELY BEFORE BEDTIME (CONTROL)   methylcellulose (CITRUCEL) oral powder Take 0.5 packets by mouth daily.   nitrofurantoin (MACRODANTIN) 50 MG capsule Take 1 capsule (50 mg total) by mouth daily.   pravastatin (PRAVACHOL) 20 MG tablet Take 1 tablet (20 mg total) by mouth daily.   cetirizine (ZYRTEC) 10 MG tablet Take 10 mg by mouth at bedtime.  (Patient not taking: Reported on 05/19/2023)   No facility-administered encounter medications on file as of 05/19/2023.    Review of Systems:  Review of Systems  Constitutional:  Positive for activity  change. Negative for appetite change.  HENT: Negative.    Respiratory:  Negative for cough and shortness of breath.   Cardiovascular:  Positive for leg swelling.  Gastrointestinal:  Negative for constipation.  Genitourinary: Negative.   Musculoskeletal:  Positive for gait problem. Negative for arthralgias and myalgias.  Skin: Negative.   Neurological:  Negative for dizziness and weakness.  Psychiatric/Behavioral:  Negative for confusion, dysphoric mood and sleep disturbance.     Health Maintenance  Topic Date Due   Medicare Annual Wellness (AWV)  10/18/2020   COVID-19 Vaccine (7 - 2023-24 season) 06/04/2023 (Originally 05/01/2023)   INFLUENZA VACCINE  07/16/2023   DTaP/Tdap/Td (3 - Td or Tdap) 05/21/2030   Pneumonia Vaccine 2+ Years old  Completed   DEXA SCAN  Completed   Zoster Vaccines- Shingrix  Completed   HPV VACCINES  Aged Out    Physical Exam: Vitals:   05/19/23 0918  BP: 134/76  Pulse: 60  Resp: 17  Temp: 97.7 F (36.5 C)  TempSrc: Temporal  SpO2: 97%  Weight: 177 lb 9.6 oz (80.6 kg)  Height: 5\' 4"  (1.626 m)   Body mass index is 30.48 kg/m. Physical Exam Vitals reviewed.  Constitutional:      Appearance: Normal appearance.  HENT:     Head: Normocephalic.     Nose: Nose normal.     Mouth/Throat:     Mouth: Mucous membranes are moist.     Pharynx: Oropharynx is clear.  Eyes:     Pupils: Pupils are equal, round, and reactive to light.  Cardiovascular:     Rate and Rhythm: Normal rate and regular rhythm.     Pulses: Normal pulses.     Heart sounds: Normal heart sounds. No murmur heard. Pulmonary:     Effort: Pulmonary effort is normal.     Breath sounds: Normal breath sounds.  Abdominal:     General: Abdomen is flat. Bowel sounds are normal.     Palpations: Abdomen is soft.  Musculoskeletal:     Cervical back: Neck supple.     Comments: Mild Swelling with Chronic Venous changes  Skin:    General: Skin is warm.  Neurological:     General: No focal  deficit present.     Mental Status: She is alert and oriented to person, place, and time.     Comments: Gait Mildly unstable without her Walker    Psychiatric:        Mood  and Affect: Mood normal.        Thought Content: Thought content normal.     Labs reviewed: Basic Metabolic Panel: Recent Labs    07/03/22 0000 01/06/23 0000  NA 142 138  K 4.3 4.1  CL 106 101  CO2 27* 22  BUN 13 9  CREATININE 0.6 0.7  CALCIUM 8.9 9.7   Liver Function Tests: Recent Labs    01/06/23 0000 04/14/23 0000  AST 31 31  ALT 17 14  ALKPHOS 89 91  ALBUMIN 4.2  --    No results for input(s): "LIPASE", "AMYLASE" in the last 8760 hours. No results for input(s): "AMMONIA" in the last 8760 hours. CBC: Recent Labs    07/03/22 0000 09/25/22 0000 01/06/23 0000  WBC 3.0 4.2 5.9  NEUTROABS  --   --  2.70  HGB 11.9* 13.1 13.9  HCT 37 39 42  PLT 144* 188 204   Lipid Panel: Recent Labs    01/06/23 0000 04/14/23 0000  CHOL 242* 189  HDL 65 67  LDLCALC 144 102  TRIG 166* 100   No results found for: "HGBA1C"  Procedures since last visit: No results found.  Assessment/Plan 1. Bilateral leg edema Edema Better with Elevation and Ted hoses Will continue that for now 2. Unstable gait Referral to Therapy for Eval and treat Also Eval for Electric Scooter per her request    Labs/tests ordered:  * No order type specified * Next appt:  07/20/2023

## 2023-05-26 DIAGNOSIS — S32010D Wedge compression fracture of first lumbar vertebra, subsequent encounter for fracture with routine healing: Secondary | ICD-10-CM | POA: Diagnosis not present

## 2023-05-26 DIAGNOSIS — R278 Other lack of coordination: Secondary | ICD-10-CM | POA: Diagnosis not present

## 2023-05-26 DIAGNOSIS — R2689 Other abnormalities of gait and mobility: Secondary | ICD-10-CM | POA: Diagnosis not present

## 2023-05-26 DIAGNOSIS — M6389 Disorders of muscle in diseases classified elsewhere, multiple sites: Secondary | ICD-10-CM | POA: Diagnosis not present

## 2023-06-10 DIAGNOSIS — S32010D Wedge compression fracture of first lumbar vertebra, subsequent encounter for fracture with routine healing: Secondary | ICD-10-CM | POA: Diagnosis not present

## 2023-06-10 DIAGNOSIS — R2689 Other abnormalities of gait and mobility: Secondary | ICD-10-CM | POA: Diagnosis not present

## 2023-06-10 DIAGNOSIS — R278 Other lack of coordination: Secondary | ICD-10-CM | POA: Diagnosis not present

## 2023-06-10 DIAGNOSIS — M6389 Disorders of muscle in diseases classified elsewhere, multiple sites: Secondary | ICD-10-CM | POA: Diagnosis not present

## 2023-06-11 DIAGNOSIS — M6389 Disorders of muscle in diseases classified elsewhere, multiple sites: Secondary | ICD-10-CM | POA: Diagnosis not present

## 2023-06-11 DIAGNOSIS — S32010D Wedge compression fracture of first lumbar vertebra, subsequent encounter for fracture with routine healing: Secondary | ICD-10-CM | POA: Diagnosis not present

## 2023-06-11 DIAGNOSIS — R2689 Other abnormalities of gait and mobility: Secondary | ICD-10-CM | POA: Diagnosis not present

## 2023-06-11 DIAGNOSIS — R278 Other lack of coordination: Secondary | ICD-10-CM | POA: Diagnosis not present

## 2023-06-12 DIAGNOSIS — M6389 Disorders of muscle in diseases classified elsewhere, multiple sites: Secondary | ICD-10-CM | POA: Diagnosis not present

## 2023-06-12 DIAGNOSIS — R278 Other lack of coordination: Secondary | ICD-10-CM | POA: Diagnosis not present

## 2023-06-12 DIAGNOSIS — R2689 Other abnormalities of gait and mobility: Secondary | ICD-10-CM | POA: Diagnosis not present

## 2023-06-12 DIAGNOSIS — S32010D Wedge compression fracture of first lumbar vertebra, subsequent encounter for fracture with routine healing: Secondary | ICD-10-CM | POA: Diagnosis not present

## 2023-06-16 ENCOUNTER — Encounter: Payer: Self-pay | Admitting: Orthopedic Surgery

## 2023-06-16 ENCOUNTER — Non-Acute Institutional Stay: Payer: Medicare Other | Admitting: Orthopedic Surgery

## 2023-06-16 DIAGNOSIS — R278 Other lack of coordination: Secondary | ICD-10-CM | POA: Diagnosis not present

## 2023-06-16 DIAGNOSIS — R21 Rash and other nonspecific skin eruption: Secondary | ICD-10-CM

## 2023-06-16 DIAGNOSIS — M6389 Disorders of muscle in diseases classified elsewhere, multiple sites: Secondary | ICD-10-CM | POA: Diagnosis not present

## 2023-06-16 DIAGNOSIS — S32010D Wedge compression fracture of first lumbar vertebra, subsequent encounter for fracture with routine healing: Secondary | ICD-10-CM | POA: Diagnosis not present

## 2023-06-16 MED ORDER — HYDROCORTISONE 0.5 % EX CREA: 1.0000 | TOPICAL_CREAM | Freq: Two times a day (BID) | CUTANEOUS | 0 refills | Status: AC

## 2023-06-16 NOTE — Progress Notes (Signed)
[facility] Room Number: 629-A Place of Service:  ALF [phone]) Provider:  Hazle Nordmann, NP  PCP: Mahlon Gammon, MD  Patient Care Team: Mahlon Gammon, MD as PCP - General (Internal Medicine) Venancio Poisson, MD as Consulting Physician (Dermatology) Eldred Manges, MD as Consulting Physician (Orthopedic Surgery) Sinda Du, MD as Consulting Physician (Ophthalmology) Rachael Fee, MD as Attending Physician (Gastroenterology) Regal, Kirstie Peri, DPM as Consulting Physician (Podiatry) Crista Elliot, MD as Consulting Physician (Urology) Flo Shanks, MD as Consulting Physician (Otolaryngology)  Extended Emergency Contact Information Primary Emergency Contact: Sharol Harness Address: [address]          [place], [place] [address] Darden [place] of [place] Phone: [phone] Relation: Son Secondary Emergency Contact: Myra Gianotti Address: [address]          [place], [place] [address] Darden Amber of [place] Mobile Phone: [phone] Relation: Son  Code Status:  FULL CODE Goals of care: Advanced Directive information    06/16/2023    9:47 AM  Advanced Directives  Does Patient Have a Medical Advance Directive? Yes  Type of Estate agent of [place];Living will  Does patient want to make changes to medical advance directive? No - Patient declined  Copy of Healthcare Power of Attorney in Chart? No - copy requested     Chief Complaint  Patient presents with   Acute Visit    Rash on face.    HPI:  Pt is a 89 y.o. female seen today for an acute visit due to rash on face,cheeks and neck.   She currently resides on the assisted living unit at [facility]. PMH: HTN, venous insufficiency, colitis, constipation, DDD, osteoporosis, vaginal vault cancer, uterine cancer, urinary retention, and glaucoma.    07/01 she began to notice increased redness to cheeks, neck and chest. Denies pain or  itching. She admits rash appeared after sitting in sun for a bit. Denies changes to soaps, lotions or detergents. I had patient show me skin care products she uses. She had been using a sample of vitamin C anti aging serum for past few weeks. Directions on back say to use sunscreen. She has not been applying sunscreen daily. Afebrile. Vitals stable.      Past Medical History:  Diagnosis Date   Allergy    Anxiety    Arthritis    Bursitis    right leg   Family history of kidney cancer    Family history of lung cancer    Family history of uterine cancer    GERD (gastroesophageal reflux disease)    History of blood transfusion    History of uterine cancer 01/19/2019   Hyperlipidemia    Hypertension    Insomnia    Non Hodgkin's lymphoma (HCC) 2017-2019   cured   Osteopenia    MILD   Urine retention    Uterine cancer (HCC)    at age 76   Past Surgical History:  Procedure Laterality Date   ABDOMINAL HYSTERECTOMY     AGE 9   APPENDECTOMY     AGE 4   BLADDER SURGERY     Catarct Surgery     ECTOPIC PREGNANCY SURGERY     age 47   LAPAROSCOPIC SMALL BOWEL RESECTION     NM PET DX LYMPHOMA  10/2018   IN REMISSION   occular pressure     OTHER SURGICAL HISTORY     REPLACEMENT TOTAL KNEE BILATERAL  TONSILLECTOMY AND ADENOIDECTOMY     age 42 or 28   UTERINE CANCER SURGERY     age 61- located in vagina    Allergies  Allergen Reactions   Cefuroxime    Clindamycin/Lincomycin    Levofloxacin    Lisinopril    Prochlorperazine Edisylate    Psyllium    Sulfa Antibiotics     Allergies as of 06/16/2023       Reactions   Cefuroxime    Clindamycin/lincomycin    Levofloxacin    Lisinopril    Prochlorperazine Edisylate    Psyllium    Sulfa Antibiotics         Medication List        Accurate as of June 16, 2023  9:47 AM. If you have any questions, ask your nurse or doctor.          STOP taking these medications    brimonidine 0.15 % ophthalmic solution Commonly  known as: ALPHAGAN Stopped by: Octavia Heir, NP   cetirizine 10 MG tablet Commonly known as: ZYRTEC Stopped by: Octavia Heir, NP       TAKE these medications    acetaminophen 500 MG tablet Commonly known as: TYLENOL Take 500 mg by mouth 2 (two) times daily. What changed: Another medication with the same name was removed. Continue taking this medication, and follow the directions you see here. Changed by: Octavia Heir, NP   bimatoprost 0.01 % Soln Commonly known as: LUMIGAN Place 1 drop into both eyes at bedtime.   Citrucel oral powder Generic drug: methylcellulose Take 0.5 packets by mouth daily.   eszopiclone 2 MG Tabs tablet Commonly known as: LUNESTA TAKE 1 TABLET BY MOUTH IMMEDIATELY BEFORE BEDTIME (CONTROL)   nitrofurantoin 50 MG capsule Commonly known as: MACRODANTIN Take 1 capsule (50 mg total) by mouth daily.   pravastatin 20 MG tablet Commonly known as: PRAVACHOL Take 1 tablet (20 mg total) by mouth daily.        Review of Systems  Constitutional:  Negative for activity change, appetite change and fever.  HENT:  Negative for trouble swallowing.   Respiratory:  Negative for cough, shortness of breath and wheezing.   Cardiovascular:  Negative for chest pain and leg swelling.  Skin:  Positive for color change and rash.  Allergic/Immunologic: Positive for environmental allergies. Negative for food allergies.  Psychiatric/Behavioral:  Negative for confusion and dysphoric mood. The patient is not nervous/anxious.     Immunization History  Administered Date(s) Administered   Fluad Quad(high Dose 65+) 09/19/2022   Influenza, High Dose Seasonal PF 09/26/2014, 08/17/2015, 08/19/2016, 08/20/2017, 08/24/2018, 09/23/2019, 10/05/2020   Influenza-Unspecified 10/05/2020, 09/18/2021   Moderna SARS-COV2 Booster Vaccination 05/19/2021, 03/06/2023   Moderna Sars-Covid-2 Vaccination 12/27/2019, 01/24/2020, 10/25/2020, 09/25/2021, 04/30/2022, 11/05/2022   Pneumococcal  Conjugate-13 09/07/2014   Pneumococcal Polysaccharide-23 09/21/2007   Tdap 05/23/2010, 05/21/2020   Zoster Recombinant(Shingrix) 09/22/2017, 11/25/2017   Zoster, Live 10/24/2005   Pertinent  Health Maintenance Due  Topic Date Due   INFLUENZA VACCINE  07/16/2023   DEXA SCAN  Completed      06/27/2022    3:32 PM 09/30/2022    8:44 AM 01/12/2023    1:10 PM 04/14/2023    2:30 PM 05/19/2023    8:46 AM  Fall Risk  Falls in the past year? 0 0 0 0 0  Was there an injury with Fall? 0 0 0 0 0  Fall Risk Category Calculator 0 0 0 0 0  Fall Risk Category (Retired)  Low Low     (RETIRED) Patient Fall Risk Level Low fall risk Low fall risk     Patient at Risk for Falls Due to No Fall Risks  No Fall Risks No Fall Risks No Fall Risks  Fall risk Follow up Falls evaluation completed Falls evaluation completed  Falls evaluation completed Falls evaluation completed   Functional Status Survey:    Vitals:   06/16/23 0944  BP: (!) 172/57  Pulse: (!) 57  Resp: 18  Temp: 97.6 F (36.4 C)  SpO2: 96%  Weight: 177 lb 6.4 oz (80.5 kg)  Height: 5\' 4"  (1.626 m)   Body mass index is 30.45 kg/m. Physical Exam Vitals reviewed.  Constitutional:      General: She is not in acute distress. HENT:     Head: Normocephalic.  Eyes:     General:        Right eye: No discharge.        Left eye: No discharge.  Cardiovascular:     Rate and Rhythm: Normal rate and regular rhythm.     Pulses: Normal pulses.     Heart sounds: Normal heart sounds.  Pulmonary:     Effort: Pulmonary effort is normal. No respiratory distress.     Breath sounds: Normal breath sounds. No wheezing or rales.  Abdominal:     General: Bowel sounds are normal.     Palpations: Abdomen is soft.  Musculoskeletal:     Cervical back: Neck supple.     Right lower leg: No edema.     Left lower leg: No edema.  Skin:    General: Skin is warm.     Capillary Refill: Capillary refill takes less than 2 seconds.     Findings: Erythema and  rash present.     Comments: Maculopapular rash to cheeks, neck and upper chest, presents like sunburn without blistering, no tenderness, no skin breakdown.   Neurological:     General: No focal deficit present.     Mental Status: She is alert and oriented to person, place, and time.  Psychiatric:        Mood and Affect: Mood normal.     Labs reviewed: Recent Labs    07/03/22 0000 01/06/23 0000  NA 142 138  K 4.3 4.1  CL 106 101  CO2 27* 22  BUN 13 9  CREATININE 0.6 0.7  CALCIUM 8.9 9.7   Recent Labs    01/06/23 0000 04/14/23 0000  AST 31 31  ALT 17 14  ALKPHOS 89 91  ALBUMIN 4.2  --    Recent Labs    07/03/22 0000 09/25/22 0000 01/06/23 0000  WBC 3.0 4.2 5.9  NEUTROABS  --   --  2.70  HGB 11.9* 13.1 13.9  HCT 37 39 42  PLT 144* 188 204   Lab Results  Component Value Date   TSH 3.54 04/03/2022   No results found for: "HGBA1C" Lab Results  Component Value Date   CHOL 189 04/14/2023   HDL 67 04/14/2023   LDLCALC 102 04/14/2023   TRIG 100 04/14/2023   CHOLHDL 3 08/24/2018    Significant Diagnostic Results in last 30 days:  No results found.  Assessment/Plan 1. Facial rash - noted 07/01 - involved cheeks, neck and upper chest - no pain or itching - suspect reaction from vitamin C serum - start hydrocortisone 0.5 % cream BID x 5 days, then BID prn x 1 month - discussed wearing sunscreen if she continues to use  serums on face - hydrocortisone cream 0.5 %; Apply 1 Application topically 2 (two) times daily for 8 days.  Dispense: 15 g; Refill: 0    Family/ staff Communication: plan discussed with patient and nurse  Labs/tests ordered:  none

## 2023-06-17 DIAGNOSIS — R278 Other lack of coordination: Secondary | ICD-10-CM | POA: Diagnosis not present

## 2023-06-17 DIAGNOSIS — S32010D Wedge compression fracture of first lumbar vertebra, subsequent encounter for fracture with routine healing: Secondary | ICD-10-CM | POA: Diagnosis not present

## 2023-06-17 DIAGNOSIS — M6389 Disorders of muscle in diseases classified elsewhere, multiple sites: Secondary | ICD-10-CM | POA: Diagnosis not present

## 2023-06-19 DIAGNOSIS — S32010D Wedge compression fracture of first lumbar vertebra, subsequent encounter for fracture with routine healing: Secondary | ICD-10-CM | POA: Diagnosis not present

## 2023-06-19 DIAGNOSIS — M6389 Disorders of muscle in diseases classified elsewhere, multiple sites: Secondary | ICD-10-CM | POA: Diagnosis not present

## 2023-06-19 DIAGNOSIS — R278 Other lack of coordination: Secondary | ICD-10-CM | POA: Diagnosis not present

## 2023-06-22 DIAGNOSIS — M6389 Disorders of muscle in diseases classified elsewhere, multiple sites: Secondary | ICD-10-CM | POA: Diagnosis not present

## 2023-06-22 DIAGNOSIS — R278 Other lack of coordination: Secondary | ICD-10-CM | POA: Diagnosis not present

## 2023-06-22 DIAGNOSIS — S32010D Wedge compression fracture of first lumbar vertebra, subsequent encounter for fracture with routine healing: Secondary | ICD-10-CM | POA: Diagnosis not present

## 2023-06-23 DIAGNOSIS — R278 Other lack of coordination: Secondary | ICD-10-CM | POA: Diagnosis not present

## 2023-06-23 DIAGNOSIS — S32010D Wedge compression fracture of first lumbar vertebra, subsequent encounter for fracture with routine healing: Secondary | ICD-10-CM | POA: Diagnosis not present

## 2023-06-23 DIAGNOSIS — M6389 Disorders of muscle in diseases classified elsewhere, multiple sites: Secondary | ICD-10-CM | POA: Diagnosis not present

## 2023-06-25 DIAGNOSIS — R278 Other lack of coordination: Secondary | ICD-10-CM | POA: Diagnosis not present

## 2023-06-25 DIAGNOSIS — M6389 Disorders of muscle in diseases classified elsewhere, multiple sites: Secondary | ICD-10-CM | POA: Diagnosis not present

## 2023-06-25 DIAGNOSIS — S32010D Wedge compression fracture of first lumbar vertebra, subsequent encounter for fracture with routine healing: Secondary | ICD-10-CM | POA: Diagnosis not present

## 2023-06-26 DIAGNOSIS — S32010D Wedge compression fracture of first lumbar vertebra, subsequent encounter for fracture with routine healing: Secondary | ICD-10-CM | POA: Diagnosis not present

## 2023-06-26 DIAGNOSIS — M6389 Disorders of muscle in diseases classified elsewhere, multiple sites: Secondary | ICD-10-CM | POA: Diagnosis not present

## 2023-06-26 DIAGNOSIS — R278 Other lack of coordination: Secondary | ICD-10-CM | POA: Diagnosis not present

## 2023-06-30 DIAGNOSIS — R338 Other retention of urine: Secondary | ICD-10-CM | POA: Diagnosis not present

## 2023-06-30 DIAGNOSIS — N312 Flaccid neuropathic bladder, not elsewhere classified: Secondary | ICD-10-CM | POA: Diagnosis not present

## 2023-07-01 DIAGNOSIS — R278 Other lack of coordination: Secondary | ICD-10-CM | POA: Diagnosis not present

## 2023-07-01 DIAGNOSIS — L821 Other seborrheic keratosis: Secondary | ICD-10-CM | POA: Diagnosis not present

## 2023-07-01 DIAGNOSIS — L814 Other melanin hyperpigmentation: Secondary | ICD-10-CM | POA: Diagnosis not present

## 2023-07-01 DIAGNOSIS — M6389 Disorders of muscle in diseases classified elsewhere, multiple sites: Secondary | ICD-10-CM | POA: Diagnosis not present

## 2023-07-01 DIAGNOSIS — S32010D Wedge compression fracture of first lumbar vertebra, subsequent encounter for fracture with routine healing: Secondary | ICD-10-CM | POA: Diagnosis not present

## 2023-07-01 DIAGNOSIS — L573 Poikiloderma of Civatte: Secondary | ICD-10-CM | POA: Diagnosis not present

## 2023-07-01 DIAGNOSIS — D692 Other nonthrombocytopenic purpura: Secondary | ICD-10-CM | POA: Diagnosis not present

## 2023-07-03 DIAGNOSIS — M6389 Disorders of muscle in diseases classified elsewhere, multiple sites: Secondary | ICD-10-CM | POA: Diagnosis not present

## 2023-07-03 DIAGNOSIS — S32010D Wedge compression fracture of first lumbar vertebra, subsequent encounter for fracture with routine healing: Secondary | ICD-10-CM | POA: Diagnosis not present

## 2023-07-03 DIAGNOSIS — R278 Other lack of coordination: Secondary | ICD-10-CM | POA: Diagnosis not present

## 2023-07-08 DIAGNOSIS — S32010D Wedge compression fracture of first lumbar vertebra, subsequent encounter for fracture with routine healing: Secondary | ICD-10-CM | POA: Diagnosis not present

## 2023-07-08 DIAGNOSIS — M6389 Disorders of muscle in diseases classified elsewhere, multiple sites: Secondary | ICD-10-CM | POA: Diagnosis not present

## 2023-07-08 DIAGNOSIS — R278 Other lack of coordination: Secondary | ICD-10-CM | POA: Diagnosis not present

## 2023-07-14 DIAGNOSIS — R278 Other lack of coordination: Secondary | ICD-10-CM | POA: Diagnosis not present

## 2023-07-14 DIAGNOSIS — M6389 Disorders of muscle in diseases classified elsewhere, multiple sites: Secondary | ICD-10-CM | POA: Diagnosis not present

## 2023-07-14 DIAGNOSIS — S32010D Wedge compression fracture of first lumbar vertebra, subsequent encounter for fracture with routine healing: Secondary | ICD-10-CM | POA: Diagnosis not present

## 2023-07-20 ENCOUNTER — Encounter: Payer: Self-pay | Admitting: Adult Health

## 2023-07-20 ENCOUNTER — Non-Acute Institutional Stay: Payer: Medicare Other | Admitting: Adult Health

## 2023-07-20 VITALS — BP 136/86 | HR 79 | Temp 97.8°F | Resp 16 | Ht 64.0 in | Wt 181.0 lb

## 2023-07-20 DIAGNOSIS — R2681 Unsteadiness on feet: Secondary | ICD-10-CM

## 2023-07-20 DIAGNOSIS — R053 Chronic cough: Secondary | ICD-10-CM

## 2023-07-20 DIAGNOSIS — F5101 Primary insomnia: Secondary | ICD-10-CM | POA: Diagnosis not present

## 2023-07-20 DIAGNOSIS — M6389 Disorders of muscle in diseases classified elsewhere, multiple sites: Secondary | ICD-10-CM | POA: Diagnosis not present

## 2023-07-20 DIAGNOSIS — H409 Unspecified glaucoma: Secondary | ICD-10-CM

## 2023-07-20 DIAGNOSIS — R278 Other lack of coordination: Secondary | ICD-10-CM | POA: Diagnosis not present

## 2023-07-20 DIAGNOSIS — E782 Mixed hyperlipidemia: Secondary | ICD-10-CM

## 2023-07-20 DIAGNOSIS — S32010D Wedge compression fracture of first lumbar vertebra, subsequent encounter for fracture with routine healing: Secondary | ICD-10-CM | POA: Diagnosis not present

## 2023-07-20 NOTE — Progress Notes (Signed)
Location:  [facility]  POS: Clinic  Provider: Fletcher Anon, ANP  Code Status: Full Goals of Care:     06/16/2023    9:47 AM  Advanced Directives  Does Patient Have a Medical Advance Directive? Yes  Type of Estate agent of [place];Living will  Does patient want to make changes to medical advance directive? No - Patient declined  Copy of Healthcare Power of Attorney in Chart? No - copy requested     Chief Complaint  Patient presents with   Medical Management of Chronic Issues    Patient is being seen for a 3 month follow up   Quality Metric Gaps    Patient is due for AWV   Immunizations    Patient is due for covid and flu vaccine    HPI: Patient is a 89 y.o. female seen today for medical management of chronic diseases.    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   Has worked with therapy, now using Art gallery manager.   Insomnia: continues lunesta  HLD: started back on statin Lab Results  Component Value Date   LDLCALC 102 04/14/2023    Feels like her eyes are itching, read an article that this could be due to the statin She is taking restasis, followed by ophthalmology. Has glaucoma.  Lab Results  Component Value Date   LDLCALC 102 04/14/2023   Reports coughing in th evening, spits up phlegm. Tends to occur when lying in bed. She has been trying robitussin DM. This helps. Going on for two months.   Continues to self cath due to neurogenic bladder Wearing a pull up  OP: 10/04/19: T score -2.1 showing osteopenia with prior hx of compression fx.  Has chosen not to pursue medication for this in the past   Weight training 4 times a week  Mammogram: aged out   Past Medical History:  Diagnosis Date   Allergy    Anxiety     Arthritis    Bursitis    right leg   Family history of kidney cancer    Family history of lung cancer    Family history of uterine cancer    GERD (gastroesophageal reflux disease)    History of blood transfusion    History of uterine cancer 01/19/2019   Hyperlipidemia    Hypertension    Insomnia    Non Hodgkin's lymphoma (HCC) 2017-2019   cured   Osteopenia    MILD   Urine retention    Uterine cancer (HCC)    at age 52    Past Surgical History:  Procedure Laterality Date   ABDOMINAL HYSTERECTOMY     AGE 7   APPENDECTOMY     AGE 47   BLADDER SURGERY     Catarct Surgery     ECTOPIC PREGNANCY SURGERY     age 64   LAPAROSCOPIC SMALL BOWEL RESECTION     NM PET DX LYMPHOMA  10/2018   IN REMISSION   occular pressure     OTHER SURGICAL HISTORY     REPLACEMENT TOTAL KNEE BILATERAL     TONSILLECTOMY AND ADENOIDECTOMY     age 19 or 61   UTERINE CANCER SURGERY     age 30- located in vagina    Allergies  Allergen Reactions   Cefuroxime  Clindamycin/Lincomycin    Levofloxacin    Lisinopril    Prochlorperazine Edisylate    Psyllium    Sulfa Antibiotics     Outpatient Encounter Medications as of 07/20/2023  Medication Sig   acetaminophen (TYLENOL) 500 MG tablet Take 500 mg by mouth 2 (two) times daily.   bimatoprost (LUMIGAN) 0.01 % SOLN Place 1 drop into both eyes at bedtime.   eszopiclone (LUNESTA) 2 MG TABS tablet TAKE 1 TABLET BY MOUTH IMMEDIATELY BEFORE BEDTIME (CONTROL)   methylcellulose (CITRUCEL) oral powder Take 0.5 packets by mouth daily.   nitrofurantoin (MACRODANTIN) 50 MG capsule Take 1 capsule (50 mg total) by mouth daily.   pravastatin (PRAVACHOL) 20 MG tablet Take 1 tablet (20 mg total) by mouth daily.   No facility-administered encounter medications on file as of 07/20/2023.    Review of Systems:  Review of Systems  Constitutional:  Negative for activity change, appetite change, chills, diaphoresis, fatigue, fever and unexpected weight change.  HENT:   Positive for hearing loss. Negative for congestion.   Respiratory:  Negative for cough, shortness of breath and wheezing.   Cardiovascular:  Negative for chest pain, palpitations and leg swelling.  Gastrointestinal:  Negative for abdominal distention, abdominal pain, constipation and diarrhea.  Genitourinary:  Negative for difficulty urinating and dysuria.  Musculoskeletal:  Negative for arthralgias, back pain, gait problem, joint swelling and myalgias.  Neurological:  Negative for dizziness, tremors, seizures, syncope, facial asymmetry, speech difficulty, weakness, light-headedness, numbness and headaches.  Psychiatric/Behavioral:  Negative for agitation, behavioral problems and confusion.     Health Maintenance  Topic Date Due   Medicare Annual Wellness (AWV)  10/18/2020   COVID-19 Vaccine (7 - 2023-24 season) 05/01/2023   INFLUENZA VACCINE  07/16/2023   DTaP/Tdap/Td (3 - Td or Tdap) 05/21/2030   Pneumonia Vaccine 24+ Years old  Completed   DEXA SCAN  Completed   Zoster Vaccines- Shingrix  Completed   HPV VACCINES  Aged Out    Physical Exam: There were no vitals filed for this visit.  There is no height or weight on file to calculate BMI. Physical Exam Vitals and nursing note reviewed.  Constitutional:      General: She is not in acute distress.    Appearance: She is not diaphoretic.  HENT:     Head: Normocephalic and atraumatic.     Nose: Nose normal.     Mouth/Throat:     Mouth: Mucous membranes are moist.     Pharynx: Oropharynx is clear. No oropharyngeal exudate.  Eyes:     General:        Right eye: No discharge.        Left eye: No discharge.     Conjunctiva/sclera: Conjunctivae normal.     Pupils: Pupils are equal, round, and reactive to light.  Neck:     Vascular: No JVD.  Cardiovascular:     Rate and Rhythm: Normal rate and regular rhythm.     Heart sounds: No murmur heard. Pulmonary:     Effort: Pulmonary effort is normal. No respiratory distress.      Breath sounds: Normal breath sounds. No wheezing.  Abdominal:     General: Bowel sounds are normal. There is no distension.     Palpations: Abdomen is soft. There is no mass.     Tenderness: There is no abdominal tenderness. There is no guarding.  Musculoskeletal:        General: No swelling, tenderness, deformity or signs of injury.  Cervical back: No rigidity or tenderness.     Comments: Trace edema to ankles  Lymphadenopathy:     Cervical: No cervical adenopathy.  Skin:    General: Skin is warm and dry.  Neurological:     Mental Status: She is alert and oriented to person, place, and time.  Psychiatric:        Mood and Affect: Mood normal.     Labs reviewed: Basic Metabolic Panel: Recent Labs    01/06/23 0000  NA 138  K 4.1  CL 101  CO2 22  BUN 9  CREATININE 0.7  CALCIUM 9.7   Liver Function Tests: Recent Labs    01/06/23 0000 04/14/23 0000  AST 31 31  ALT 17 14  ALKPHOS 89 91  ALBUMIN 4.2  --    No results for input(s): "LIPASE", "AMYLASE" in the last 8760 hours. No results for input(s): "AMMONIA" in the last 8760 hours. CBC: Recent Labs    09/25/22 0000 01/06/23 0000  WBC 4.2 5.9  NEUTROABS  --  2.70  HGB 13.1 13.9  HCT 39 42  PLT 188 204   Lipid Panel: Recent Labs    01/06/23 0000 04/14/23 0000  CHOL 242* 189  HDL 65 67  LDLCALC 144 102  TRIG 166* 100   No results found for: "HGBA1C"  Procedures since last visit: No results found.  Assessment/Plan  1. Mixed hyperlipidemia Improved Continue pravachol  2. Chronic cough At night, using robitussin DM sporadically  If worsening consider nasal spray Continue zyrtec  3. Glaucoma, unspecified glaucoma type, unspecified laterality On new medication restasis wondering if this is causing itching vs the statin Recommend discussion with ophthalmology as I have not seen that be an issue with statins causing eye itching Continue zyrtec  4. Primary insomnia Continue lunesta  5. Gait  instability Using cane or walker for shorter distances Motorized chair for longer distances  6. Neurogenic bladder Continue I and O cath     Labs/tests ordered:  * No order type specified *CBC BMP  prior to next apt to be drawn in [place] setting prior to 10/20/23 Next appt:  3 months with Dr Chales Abrahams   Total time :  time greater than 50% of total time spent doing pt counseling and coordination of care

## 2023-08-11 ENCOUNTER — Non-Acute Institutional Stay (INDEPENDENT_AMBULATORY_CARE_PROVIDER_SITE_OTHER): Payer: Medicare Other | Admitting: Orthopedic Surgery

## 2023-08-11 ENCOUNTER — Encounter: Payer: Self-pay | Admitting: Orthopedic Surgery

## 2023-08-11 DIAGNOSIS — H401131 Primary open-angle glaucoma, bilateral, mild stage: Secondary | ICD-10-CM | POA: Diagnosis not present

## 2023-08-11 DIAGNOSIS — Z0000 Encounter for general adult medical examination without abnormal findings: Secondary | ICD-10-CM

## 2023-08-11 DIAGNOSIS — H04123 Dry eye syndrome of bilateral lacrimal glands: Secondary | ICD-10-CM | POA: Diagnosis not present

## 2023-08-11 NOTE — Progress Notes (Signed)
Subjective:   Linda [name] is a 89 y.o. female who presents for Medicare Annual (Subsequent) preventive examination.  Visit Complete: In person  Patient Medicare AWV questionnaire was completed by the patient on 08/11/2023; I have confirmed that all information answered by patient is correct and no changes since this date.  Review of Systems     Cardiac Risk Factors include: advanced age (>2men, >27 women);hypertension;dyslipidemia;sedentary lifestyle;obesity (BMI >30kg/m2)     Objective:    Today's Vitals   08/11/23 1056  BP: 128/66  Pulse: (!) 53  Resp: 18  Temp: 98 F (36.7 C)  SpO2: 97%  Weight: 179 lb (81.2 kg)  Height: 5\' 4"  (1.626 m)   Body mass index is 30.73 kg/m.     07/20/2023    1:07 PM 06/16/2023    9:47 AM 05/19/2023    8:47 AM 04/14/2023    2:30 PM 01/12/2023    1:11 PM 09/30/2022    8:44 AM 08/20/2022    8:34 AM  Advanced Directives  Does Patient Have a Medical Advance Directive? Yes Yes Yes Yes Yes Yes Yes  Type of Estate agent of [place];Living will Healthcare Power of [place];Living will Healthcare Power of [place];Living will Healthcare Power of [place];Living will Healthcare Power of [place];Living will Healthcare Power of [employer] of [place];Living will  Does patient want to make changes to medical advance directive? No - Patient declined No - Patient declined No - Patient declined No - Patient declined No - Patient declined  No - Patient declined  Copy of Healthcare Power of Attorney in Chart? No - copy requested No - copy requested No - copy requested No - copy requested  Yes - validated most recent copy scanned in chart (See row information) Yes - validated most recent copy scanned in chart (See row information)    Current Medications (verified) Outpatient Encounter Medications as of 08/11/2023  Medication Sig   acetaminophen (TYLENOL) 500 MG tablet Take 500 mg by mouth 2 (two) times daily.    bimatoprost (LUMIGAN) 0.01 % SOLN Place 1 drop into both eyes at bedtime.   brimonidine (ALPHAGAN) 0.15 % ophthalmic solution 1 drop 3 (three) times daily.   cetirizine (ZYRTEC) 10 MG tablet Take 10 mg by mouth at bedtime.   cycloSPORINE (RESTASIS) 0.05 % ophthalmic emulsion 1 drop 2 (two) times daily.   eszopiclone (LUNESTA) 2 MG TABS tablet TAKE 1 TABLET BY MOUTH IMMEDIATELY BEFORE BEDTIME (CONTROL)   methylcellulose (CITRUCEL) oral powder Take 0.5 packets by mouth daily.   nitrofurantoin (MACRODANTIN) 50 MG capsule Take 1 capsule (50 mg total) by mouth daily.   pravastatin (PRAVACHOL) 20 MG tablet Take 1 tablet (20 mg total) by mouth daily.   No facility-administered encounter medications on file as of 08/11/2023.    Allergies (verified) Cefuroxime, Clindamycin/lincomycin, Levofloxacin, Lisinopril, Prochlorperazine edisylate, Psyllium, and Sulfa antibiotics   History: Past Medical History:  Diagnosis Date   Allergy    Anxiety    Arthritis    Bursitis    right leg   Family history of kidney cancer    Family history of lung cancer    Family history of uterine cancer    GERD (gastroesophageal reflux disease)    History of blood transfusion    History of uterine cancer 01/19/2019   Hyperlipidemia    Hypertension    Insomnia    Non Hodgkin's lymphoma (HCC) 2017-2019   cured   Osteopenia    MILD   Urine retention  Uterine cancer (HCC)    at age 89   Past Surgical History:  Procedure Laterality Date   ABDOMINAL HYSTERECTOMY     AGE 69   APPENDECTOMY     AGE 70   BLADDER SURGERY     Catarct Surgery     ECTOPIC PREGNANCY SURGERY     age 73   LAPAROSCOPIC SMALL BOWEL RESECTION     NM PET DX LYMPHOMA  10/2018   IN REMISSION   occular pressure     OTHER SURGICAL HISTORY     REPLACEMENT TOTAL KNEE BILATERAL     TONSILLECTOMY AND ADENOIDECTOMY     age 41 or 73   UTERINE CANCER SURGERY     age 34- located in vagina   Family History  Problem Relation Age of Onset    Heart failure Mother 43   Pneumonia Father 79   Dementia Father    Uterine cancer Sister 22       uterine cancer   Other Sister        Guillain Barre syndrom   Uterine cancer Maternal Grandmother 28   COPD Maternal Grandfather    Lung cancer Maternal Grandfather    Dementia Paternal Grandmother    Kidney cancer Son 85   Lung cancer Maternal Uncle    Colon cancer Neg Hx    Esophageal cancer Neg Hx    Liver cancer Neg Hx    Pancreatic cancer Neg Hx    Rectal cancer Neg Hx    Stomach cancer Neg Hx    Social History   Socioeconomic History   Marital status: Single    Spouse name: Not on file   Number of children: 3   Years of education: Not on file   Highest education level: Not on file  Occupational History   Occupation: retired  Tobacco Use   Smoking status: Never   Smokeless tobacco: Never  Vaping Use   Vaping status: Never Used  Substance and Sexual Activity   Alcohol use: Yes    Alcohol/week: 7.0 standard drinks of alcohol    Types: 7 Glasses of wine per week    Comment: one glass of wine with dinner   Drug use: No   Sexual activity: Never  Other Topics Concern   Not on file  Social History Narrative   Social History      Diet? Low to no fiber diet; lactose (no tolerance), low or no soy      Do you drink/eat things with caffeine? No liquid caffeine, occasional chocolate      Marital status?                divorced                    What year were you married? n/a      Do you live in a house, apartment, assisted living, condo, trailer, etc.? Apartment/ retirement      Is it one or more stories? one      How many persons live in your home? one      Do you have any pets in your home? (please list) no      Highest level of education completed? Master degree      Current or past profession: Audiologist/ Speech Therapist      Do you exercise?                 yes  Type & how often? Water aerobics 3 x week      Advanced Directives       Do you have a living will? yes      Do you have a DNR form?        yes                         If not, do you want to discuss one?      Do you have signed POA/HPOA for forms? yes      Functional Status : completed by self      Do you have difficulty bathing or dressing yourself? no      Do you have difficulty preparing food or eating? no      Do you have difficulty managing your medications? No      Do you have difficulty managing your finances? no      Do you have difficulty affording your medications? no   Social Determinants of Health   Financial Resource Strain: Low Risk  (08/11/2023)   Overall Financial Resource Strain (CARDIA)    Difficulty of Paying Living Expenses: Not hard at all  Food Insecurity: No Food Insecurity (08/11/2023)   Hunger Vital Sign    Worried About Running Out of Food in the Last Year: Never true    Ran Out of Food in the Last Year: Never true  Transportation Needs: No Transportation Needs (08/11/2023)   PRAPARE - Administrator, Civil Service (Medical): No    Lack of Transportation (Non-Medical): No  Physical Activity: Insufficiently Active (08/11/2023)   Exercise Vital Sign    Days of Exercise per Week: 5 days    Minutes of Exercise per Session: 20 min  Stress: No Stress Concern Present (08/11/2023)   [employer] of Occupational Health - Occupational Stress Questionnaire    Feeling of Stress : Not at all  Social Connections: Socially Isolated (08/11/2023)   Social Connection and Isolation Panel [NHANES]    Frequency of Communication with Friends and Family: More than three times a week    Frequency of Social Gatherings with Friends and Family: More than three times a week    Attends Religious Services: Never    Database administrator or Organizations: No    Attends Banker Meetings: Never    Marital Status: Widowed    Tobacco Counseling Counseling given: Not Answered   Clinical Intake:  Pre-visit preparation  completed: No  Pain : No/denies pain     BMI - recorded: 30.73 Nutritional Status: BMI > 30  Obese Nutritional Risks: None Diabetes: No  How often do you need to have someone help you when you read instructions, pamphlets, or other written materials from your doctor or pharmacy?: 2 - Rarely What is the last grade level you completed in school?: Masters degree  Interpreter Needed?: No      Activities of Daily Living    08/11/2023   11:01 AM  In your present state of health, do you have any difficulty performing the following activities:  Hearing? 0  Vision? 0  Difficulty concentrating or making decisions? 0  Walking or climbing stairs? 1  Dressing or bathing? 0  Doing errands, shopping? 1  Preparing Food and eating ? Y  Using the Toilet? N  In the past six months, have you accidently leaked urine? N  Do you have problems with loss of bowel control? N  Managing your Medications?  N  Managing your Finances? Y  Housekeeping or managing your Housekeeping? Y    Patient Care Team: Mahlon Gammon, MD as PCP - General (Internal Medicine) Venancio Poisson, MD as Consulting Physician (Dermatology) Eldred Manges, MD as Consulting Physician (Orthopedic Surgery) Sinda Du, MD as Consulting Physician (Ophthalmology) Rachael Fee, MD as Attending Physician (Gastroenterology) Regal, Kirstie Peri, DPM as Consulting Physician (Podiatry) Crista Elliot, MD as Consulting Physician (Urology) Flo Shanks, MD as Consulting Physician (Otolaryngology)  Indicate any recent Medical Services you may have received from other than [facility] providers in the past year (date may be approximate).     Assessment:   This is a routine wellness examination for Lada.  Hearing/Vision screen No results found.  Dietary issues and exercise activities discussed:     Goals Addressed             This Visit's Progress    Maintain Mobility and Function   On track    Evidence-based guidance:   Acknowledge and validate impact of pain, loss of strength and potential disfigurement (hand osteoarthritis) on mental health and daily life, such as social isolation, anxiety, depression, impaired sexual relationship and   injury from falls.  Anticipate referral to physical or occupational therapy for assessment, therapeutic exercise and recommendation for adaptive equipment or assistive devices; encourage participation.  Assess impact on ability to perform activities of daily living, as well as engage in sports and leisure events or requirements of work or school.  Provide anticipatory guidance and reassurance about the benefit of exercise to maintain function; acknowledge and normalize fear that exercise may worsen symptoms.  Encourage regular exercise, at least 10 minutes at a time for 45 minutes per week; consider yoga, water exercise and proprioceptive exercises; encourage use of wearable activity tracker to increase motivation and adherence.  Encourage maintenance or resumption of daily activities, including employment, as pain allows and with minimal exposure to trauma.  Assist patient to advocate for adaptations to the work environment.  Consider level of pain and function, gender, age, lifestyle, patient preference, quality of life, readiness and ?ocapacity to benefit? when recommending patients for orthopaedic surgery consultation.  Explore strategies, such as changes to medication regimen or activity that enables patient to anticipate and manage flare-ups that increase deconditioning and disability.  Explore patient preferences; encourage exposure to a broader range of activities that have been avoided for fear of experiencing pain.  Identify barriers to participation in therapy or exercise, such as pain with activity, anticipated or imagined pain.  Monitor postoperative joint replacement or any preexisting joint replacement for ongoing pain and loss of function; provide social support  and encouragement throughout recovery.   Notes:        Depression Screen    08/11/2023   11:34 AM 07/20/2023    1:07 PM 05/19/2023    8:46 AM 04/14/2023    2:30 PM 09/30/2022    8:44 AM 10/19/2019   11:44 AM 07/13/2019    9:49 AM  PHQ 2/9 Scores  PHQ - 2 Score 0 0 0 0 0 0 0    Fall Risk    08/11/2023   11:34 AM 07/20/2023    1:07 PM 05/19/2023    8:46 AM 04/14/2023    2:30 PM 01/12/2023    1:10 PM  Fall Risk   Falls in the past year? 0 0 0 0 0  Number falls in past yr: 0 0 0 0 0  Injury with Fall? 0 0 0  0 0  Risk for fall due to : Impaired balance/gait  No Fall Risks No Fall Risks No Fall Risks  Follow up Falls evaluation completed;Education provided Falls evaluation completed Falls evaluation completed Falls evaluation completed     MEDICARE RISK AT HOME: Medicare Risk at Home Any stairs in or around the home?: No If so, are there any without handrails?: No Home free of loose throw rugs in walkways, pet beds, electrical cords, etc?: Yes Adequate lighting in your home to reduce risk of falls?: Yes Life alert?: No Use of a cane, walker or w/c?: Yes Grab bars in the bathroom?: Yes Shower chair or bench in shower?: Yes Elevated toilet seat or a handicapped toilet?: Yes  TIMED UP AND GO:  Was the test performed?  No    Cognitive Function:    08/11/2023   11:04 AM  MMSE - Mini Mental State Exam  Orientation to time 5  Orientation to Place 5  Registration 3  Attention/ Calculation 5  Recall 3  Language- name 2 objects 2  Language- repeat 1  Language- follow 3 step command 3  Language- read & follow direction 1  Write a sentence 1  Copy design 1  Total score 30        10/19/2019   11:51 AM  6CIT Screen  What Year? 0 points  What month? 0 points  What time? 0 points  Count back from 20 0 points  Months in reverse 0 points  Repeat phrase 2 points  Total Score 2 points    Immunizations Immunization History  Administered Date(s) Administered   Fluad Quad(high  Dose 65+) 09/19/2022   Influenza, High Dose Seasonal PF 09/26/2014, 08/17/2015, 08/19/2016, 08/20/2017, 08/24/2018, 09/23/2019, 10/05/2020   Influenza-Unspecified 10/05/2020, 09/18/2021   Moderna SARS-COV2 Booster Vaccination 05/19/2021, 03/06/2023   Moderna Sars-Covid-2 Vaccination 12/27/2019, 01/24/2020, 10/25/2020, 09/25/2021, 04/30/2022, 11/05/2022   Pneumococcal Conjugate-13 09/07/2014   Pneumococcal Polysaccharide-23 09/21/2007   Tdap 05/23/2010, 05/21/2020   Zoster Recombinant(Shingrix) 09/22/2017, 11/25/2017   Zoster, Live 10/24/2005    TDAP status: Up to date  Flu Vaccine status: Due, Education has been provided regarding the importance of this vaccine. Advised may receive this vaccine at local pharmacy or Health Dept. Aware to provide a copy of the vaccination record if obtained from local pharmacy or Health Dept. Verbalized acceptance and understanding.  Pneumococcal vaccine status: Up to date  Covid-19 vaccine status: Completed vaccines  Qualifies for Shingles Vaccine? Yes   Zostavax completed Yes   Shingrix Completed?: Yes  Screening Tests Health Maintenance  Topic Date Due   COVID-19 Vaccine (7 - 2023-24 season) 05/01/2023   INFLUENZA VACCINE  07/16/2023   Medicare Annual Wellness (AWV)  08/10/2024   DTaP/Tdap/Td (3 - Td or Tdap) 05/21/2030   Pneumonia Vaccine 20+ Years old  Completed   DEXA SCAN  Completed   Zoster Vaccines- Shingrix  Completed   HPV VACCINES  Aged Out    Health Maintenance  Health Maintenance Due  Topic Date Due   COVID-19 Vaccine (7 - 2023-24 season) 05/01/2023   INFLUENZA VACCINE  07/16/2023    Colorectal cancer screening: No longer required.   Mammogram status: No longer required due to advanced age.  Bone Density status: Completed 2020. Results reflect: Bone density results: OSTEOPENIA. Repeat every 5> refused years.  Lung Cancer Screening: (Low Dose CT Chest recommended if Age 53-80 years, 20 pack-year currently smoking OR have  quit w/in 15years.) does not qualify.   Lung Cancer Screening Referral: No  Additional  Screening:  Hepatitis C Screening: does not qualify; Completed   Vision Screening: Recommended annual ophthalmology exams for early detection of glaucoma and other disorders of the eye. Is the patient up to date with their annual eye exam?  Yes  Who is the provider or what is the name of the office in which the patient attends annual eye exams? Dr. Cathey Endow If pt is not established with a provider, would they like to be referred to a provider to establish care? No .   Dental Screening: Recommended annual dental exams for proper oral hygiene  Diabetic Foot Exam: Diabetic Foot Exam: Completed 08/11/2023  Community Resource Referral / Chronic Care Management: CRR required this visit?  No   CCM required this visit?  No     Plan:     I have personally reviewed and noted the following in the patient's chart:   Medical and social history Use of alcohol, tobacco or illicit drugs  Current medications and supplements including opioid prescriptions. Patient is not currently taking opioid prescriptions. Functional ability and status Nutritional status Physical activity Advanced directives List of other physicians Hospitalizations, surgeries, and ER visits in previous 12 months Vitals Screenings to include cognitive, depression, and falls Referrals and appointments  In addition, I have reviewed and discussed with patient certain preventive protocols, quality metrics, and best practice recommendations. A written personalized care plan for preventive services as well as general preventive health recommendations were provided to patient.     Octavia Heir, NP   08/11/2023   After Visit Summary: (MyChart) Due to this being a telephonic visit, the after visit summary with patients personalized plan was offered to patient via MyChart   Nurse Notes: No future mammograms or DEXA, plans to get covid  booster/flu/prevnar 20 at local [facility] this fall

## 2023-08-11 NOTE — Patient Instructions (Addendum)
  Linda [name] , Thank you for taking time to come for your Medicare Wellness Visit. I appreciate your ongoing commitment to your health goals. Please review the following plan we discussed and let me know if I can assist you in the future.   These are the goals we discussed:  Goals      Maintain Mobility and Function     Evidence-based guidance:  Acknowledge and validate impact of pain, loss of strength and potential disfigurement (hand osteoarthritis) on mental health and daily life, such as social isolation, anxiety, depression, impaired sexual relationship and   injury from falls.  Anticipate referral to physical or occupational therapy for assessment, therapeutic exercise and recommendation for adaptive equipment or assistive devices; encourage participation.  Assess impact on ability to perform activities of daily living, as well as engage in sports and leisure events or requirements of work or school.  Provide anticipatory guidance and reassurance about the benefit of exercise to maintain function; acknowledge and normalize fear that exercise may worsen symptoms.  Encourage regular exercise, at least 10 minutes at a time for 45 minutes per week; consider yoga, water exercise and proprioceptive exercises; encourage use of wearable activity tracker to increase motivation and adherence.  Encourage maintenance or resumption of daily activities, including employment, as pain allows and with minimal exposure to trauma.  Assist patient to advocate for adaptations to the work environment.  Consider level of pain and function, gender, age, lifestyle, patient preference, quality of life, readiness and ?ocapacity to benefit? when recommending patients for orthopaedic surgery consultation.  Explore strategies, such as changes to medication regimen or activity that enables patient to anticipate and manage flare-ups that increase deconditioning and disability.  Explore patient preferences; encourage  exposure to a broader range of activities that have been avoided for fear of experiencing pain.  Identify barriers to participation in therapy or exercise, such as pain with activity, anticipated or imagined pain.  Monitor postoperative joint replacement or any preexisting joint replacement for ongoing pain and loss of function; provide social support and encouragement throughout recovery.   Notes:         This is a list of the screening recommended for you and due dates:  Health Maintenance  Topic Date Due   COVID-19 Vaccine (7 - 2023-24 season) 05/01/2023   Flu Shot  07/16/2023   Medicare Annual Wellness Visit  08/10/2024   DTaP/Tdap/Td vaccine (3 - Td or Tdap) 05/21/2030   Pneumonia Vaccine  Completed   DEXA scan (bone density measurement)  Completed   Zoster (Shingles) Vaccine  Completed   HPV Vaccine  Aged Out   No future mammograms or DEXA, plans to get covid booster[facility]flu[facility]nar [facility] fall

## 2023-08-14 DIAGNOSIS — Z23 Encounter for immunization: Secondary | ICD-10-CM | POA: Diagnosis not present

## 2023-08-25 DIAGNOSIS — S32010D Wedge compression fracture of first lumbar vertebra, subsequent encounter for fracture with routine healing: Secondary | ICD-10-CM | POA: Diagnosis not present

## 2023-08-25 DIAGNOSIS — R278 Other lack of coordination: Secondary | ICD-10-CM | POA: Diagnosis not present

## 2023-08-25 DIAGNOSIS — M6389 Disorders of muscle in diseases classified elsewhere, multiple sites: Secondary | ICD-10-CM | POA: Diagnosis not present

## 2023-08-27 DIAGNOSIS — R278 Other lack of coordination: Secondary | ICD-10-CM | POA: Diagnosis not present

## 2023-08-27 DIAGNOSIS — M6389 Disorders of muscle in diseases classified elsewhere, multiple sites: Secondary | ICD-10-CM | POA: Diagnosis not present

## 2023-08-27 DIAGNOSIS — S32010D Wedge compression fracture of first lumbar vertebra, subsequent encounter for fracture with routine healing: Secondary | ICD-10-CM | POA: Diagnosis not present

## 2023-09-03 DIAGNOSIS — M6389 Disorders of muscle in diseases classified elsewhere, multiple sites: Secondary | ICD-10-CM | POA: Diagnosis not present

## 2023-09-03 DIAGNOSIS — R278 Other lack of coordination: Secondary | ICD-10-CM | POA: Diagnosis not present

## 2023-09-03 DIAGNOSIS — S32010D Wedge compression fracture of first lumbar vertebra, subsequent encounter for fracture with routine healing: Secondary | ICD-10-CM | POA: Diagnosis not present

## 2023-09-04 DIAGNOSIS — Z23 Encounter for immunization: Secondary | ICD-10-CM | POA: Diagnosis not present

## 2023-09-07 DIAGNOSIS — S32010D Wedge compression fracture of first lumbar vertebra, subsequent encounter for fracture with routine healing: Secondary | ICD-10-CM | POA: Diagnosis not present

## 2023-09-07 DIAGNOSIS — M6389 Disorders of muscle in diseases classified elsewhere, multiple sites: Secondary | ICD-10-CM | POA: Diagnosis not present

## 2023-09-07 DIAGNOSIS — R278 Other lack of coordination: Secondary | ICD-10-CM | POA: Diagnosis not present

## 2023-09-09 DIAGNOSIS — S32010D Wedge compression fracture of first lumbar vertebra, subsequent encounter for fracture with routine healing: Secondary | ICD-10-CM | POA: Diagnosis not present

## 2023-09-09 DIAGNOSIS — M6389 Disorders of muscle in diseases classified elsewhere, multiple sites: Secondary | ICD-10-CM | POA: Diagnosis not present

## 2023-09-09 DIAGNOSIS — R278 Other lack of coordination: Secondary | ICD-10-CM | POA: Diagnosis not present

## 2023-09-14 ENCOUNTER — Telehealth: Payer: Medicare Other | Admitting: Internal Medicine

## 2023-09-14 NOTE — Telephone Encounter (Signed)
Patient with Low grade fever cough and recent exposure to Covid Tested positive for Covid Paxlovid started Hold statin while on paxlovid

## 2023-10-15 DIAGNOSIS — I10 Essential (primary) hypertension: Secondary | ICD-10-CM | POA: Diagnosis not present

## 2023-10-15 DIAGNOSIS — D649 Anemia, unspecified: Secondary | ICD-10-CM | POA: Diagnosis not present

## 2023-10-15 LAB — HEPATIC FUNCTION PANEL
ALT: 19 U/L (ref 7–35)
AST: 28 (ref 13–35)
Alkaline Phosphatase: 89 (ref 25–125)
Bilirubin, Total: 0.3

## 2023-10-15 LAB — COMPREHENSIVE METABOLIC PANEL
Albumin: 3.8 (ref 3.5–5.0)
Calcium: 9.3 (ref 8.7–10.7)
Globulin: 2.5
eGFR: 84

## 2023-10-15 LAB — CBC AND DIFFERENTIAL
HCT: 37 (ref 36–46)
Hemoglobin: 12.3 (ref 12.0–16.0)
Platelets: 159 10*3/uL (ref 150–400)
WBC: 4.5

## 2023-10-15 LAB — BASIC METABOLIC PANEL
BUN: 16 (ref 4–21)
CO2: 28 — AB (ref 13–22)
Chloride: 106 (ref 99–108)
Creatinine: 0.7 (ref 0.5–1.1)
Glucose: 87
Potassium: 4.3 meq/L (ref 3.5–5.1)
Sodium: 142 (ref 137–147)

## 2023-10-15 LAB — CBC: RBC: 4.07 (ref 3.87–5.11)

## 2023-10-20 ENCOUNTER — Non-Acute Institutional Stay: Payer: Medicare Other | Admitting: Internal Medicine

## 2023-10-20 ENCOUNTER — Encounter: Payer: Self-pay | Admitting: Internal Medicine

## 2023-10-20 VITALS — BP 130/72 | HR 60 | Temp 97.8°F | Resp 18 | Ht 64.0 in | Wt 182.0 lb

## 2023-10-20 DIAGNOSIS — F5101 Primary insomnia: Secondary | ICD-10-CM | POA: Diagnosis not present

## 2023-10-20 DIAGNOSIS — R2681 Unsteadiness on feet: Secondary | ICD-10-CM | POA: Diagnosis not present

## 2023-10-20 DIAGNOSIS — H409 Unspecified glaucoma: Secondary | ICD-10-CM | POA: Diagnosis not present

## 2023-10-20 DIAGNOSIS — R053 Chronic cough: Secondary | ICD-10-CM | POA: Diagnosis not present

## 2023-10-20 DIAGNOSIS — U099 Post covid-19 condition, unspecified: Secondary | ICD-10-CM | POA: Diagnosis not present

## 2023-10-20 DIAGNOSIS — E782 Mixed hyperlipidemia: Secondary | ICD-10-CM | POA: Diagnosis not present

## 2023-10-21 NOTE — Progress Notes (Signed)
Location:  [facility] features editor of Service:  Clinic (12)  Provider:   Code Status: DNR Goals of Care:     10/20/2023    1:05 PM  Advanced Directives  Does Patient Have a Medical Advance Directive? Yes  Type of Estate agent of [place];Living will  Does patient want to make changes to medical advance directive? No - Patient declined  Copy of Healthcare Power of Attorney in Chart? Yes - validated most recent copy scanned in chart (See row information)     Chief Complaint  Patient presents with   Medical Management of Chronic Issues    Patient is here for a 3 month follow up     HPI: Patient is a 89 y.o. female seen today for medical management of chronic diseases.   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   Follows with Urology for Neuropathic Bladder They recommended Botox which she refused  Continues In And Out Cath   Unstable Gait Now has Scooter and doing well HLD was started on Statin But now she wants to stop it  Otherwise no Other issue Does well in AL very independent No Falls Wt Readings from Last 3 Encounters:  10/20/23 182 lb (82.6 kg)  08/11/23 179 lb (81.2 kg)  07/20/23 181 lb (82.1 kg)    Past Medical History:  Diagnosis Date   Allergy    Anxiety    Arthritis    Bursitis    right leg   Family history of kidney cancer    Family history of lung cancer    Family history of uterine cancer    GERD (gastroesophageal reflux disease)    History of blood transfusion    History of uterine cancer 01/19/2019   Hyperlipidemia    Hypertension    Insomnia    Non Hodgkin's lymphoma (HCC) 2017-2019   cured    Osteopenia    MILD   Urine retention    Uterine cancer (HCC)    at age 74    Past Surgical History:  Procedure Laterality Date   ABDOMINAL HYSTERECTOMY     AGE 45   APPENDECTOMY     AGE 28   BLADDER SURGERY     Catarct Surgery     ECTOPIC PREGNANCY SURGERY     age 42   LAPAROSCOPIC SMALL BOWEL RESECTION     NM PET DX LYMPHOMA  10/2018   IN REMISSION   occular pressure     OTHER SURGICAL HISTORY     REPLACEMENT TOTAL KNEE BILATERAL     TONSILLECTOMY AND ADENOIDECTOMY     age 46 or 44   UTERINE CANCER SURGERY     age 3- located in vagina    Allergies  Allergen Reactions   Cefuroxime    Clindamycin/Lincomycin    Levofloxacin    Lisinopril    Prochlorperazine Edisylate    Psyllium    Sulfa Antibiotics     Outpatient Encounter Medications as of 10/20/2023  Medication Sig   acetaminophen (TYLENOL) 500 MG tablet Take 500 mg by mouth 2 (two) times daily.   bimatoprost (LUMIGAN) 0.01 % SOLN Place 1 drop into both eyes  at bedtime.   brimonidine (ALPHAGAN) 0.15 % ophthalmic solution 1 drop 3 (three) times daily.   Dextromethorphan-guaiFENesin (GUAIFENESIN-DM) 100-10 MG/5ML LIQD Take 5 mLs by mouth every 6 (six) hours as needed.   eszopiclone (LUNESTA) 2 MG TABS tablet TAKE 1 TABLET BY MOUTH IMMEDIATELY BEFORE BEDTIME (CONTROL)   methylcellulose (CITRUCEL) oral powder Take 0.5 packets by mouth daily.   nitrofurantoin (MACRODANTIN) 50 MG capsule Take 1 capsule (50 mg total) by mouth daily.   ondansetron (ZOFRAN) 4 MG tablet Take 4 mg by mouth every 8 (eight) hours as needed.   [DISCONTINUED] pravastatin (PRAVACHOL) 20 MG tablet Take 1 tablet (20 mg total) by mouth daily.   cetirizine (ZYRTEC) 10 MG tablet Take 10 mg by mouth at bedtime.   [DISCONTINUED] cycloSPORINE (RESTASIS) 0.05 % ophthalmic emulsion 1 drop 2 (two) times daily. (Patient not taking: Reported on 10/20/2023)   No facility-administered encounter medications on file as of 10/20/2023.    Review of Systems:   Review of Systems  Constitutional:  Negative for activity change and appetite change.  HENT: Negative.    Respiratory:  Negative for cough and shortness of breath.   Cardiovascular:  Negative for leg swelling.  Gastrointestinal:  Negative for constipation.  Genitourinary: Negative.   Musculoskeletal:  Positive for gait problem. Negative for arthralgias and myalgias.  Skin: Negative.   Neurological:  Negative for dizziness and weakness.  Psychiatric/Behavioral:  Positive for sleep disturbance. Negative for confusion and dysphoric mood.     Health Maintenance  Topic Date Due   COVID-19 Vaccine (7 - 2023-24 season) 08/16/2023   Medicare Annual Wellness (AWV)  08/10/2024   DTaP/Tdap/Td (3 - Td or Tdap) 05/21/2030   Pneumonia Vaccine 62+ Years old  Completed   INFLUENZA VACCINE  Completed   DEXA SCAN  Completed   Zoster Vaccines- Shingrix  Completed   HPV VACCINES  Aged Out    Physical Exam: Vitals:   10/20/23 1303  BP: 130/72  Pulse: 60  Resp: 18  Temp: 97.8 F (36.6 C)  TempSrc: Temporal  SpO2: 97%  Weight: 182 lb (82.6 kg)  Height: 5\' 4"  (1.626 m)   Body mass index is 31.24 kg/m. Physical Exam Vitals reviewed.  Constitutional:      Appearance: Normal appearance.  HENT:     Head: Normocephalic.     Nose: Nose normal.     Mouth/Throat:     Mouth: Mucous membranes are moist.     Pharynx: Oropharynx is clear.  Eyes:     Pupils: Pupils are equal, round, and reactive to light.  Cardiovascular:     Rate and Rhythm: Normal rate and regular rhythm.     Pulses: Normal pulses.     Heart sounds: Normal heart sounds. No murmur heard. Pulmonary:     Effort: Pulmonary effort is normal.     Breath sounds: Normal breath sounds.  Abdominal:     General: Abdomen is flat. Bowel sounds are normal.     Palpations: Abdomen is soft.  Musculoskeletal:        General: No swelling.     Cervical back: Neck supple.  Skin:    General: Skin is warm.     Comments: Small skin tear  in Right hand   Neurological:     General: No focal deficit present.     Mental Status: She is alert and oriented to person, place, and time.  Psychiatric:        Mood and Affect: Mood normal.  Thought Content: Thought content normal.     Labs reviewed: Basic Metabolic Panel: Recent Labs    01/06/23 0000 10/15/23 0000  NA 138 142  K 4.1 4.3  CL 101 106  CO2 22 28*  BUN 9 16  CREATININE 0.7 0.7  CALCIUM 9.7 9.3   Liver Function Tests: Recent Labs    01/06/23 0000 04/14/23 0000 10/15/23 0000  AST 31 31 28   ALT 17 14 19   ALKPHOS 89 91 89  ALBUMIN 4.2  --  3.8   No results for input(s): "LIPASE", "AMYLASE" in the last 8760 hours. No results for input(s): "AMMONIA" in the last 8760 hours. CBC: Recent Labs    01/06/23 0000 10/15/23 0000  WBC 5.9 4.5  NEUTROABS 2.70  --   HGB 13.9 12.3  HCT 42 37  PLT 204 159   Lipid Panel: Recent Labs    01/06/23 0000 04/14/23 0000  CHOL 242* 189  HDL 65 67  LDLCALC 144 102  TRIG 166* 100   No results found for: "HGBA1C"  Procedures since last visit: No results found.  Assessment/Plan 1. Post-COVID chronic cough Continue Robitussin PRn  2. Mixed hyperlipidemia Statin Discontinued per her request Repeat Lipid before next visit  3. Glaucoma, unspecified glaucoma type, unspecified laterality Now on New Drops  4. Primary insomnia Lunesta Failed GDR  5. Gait instability Using Scooter now doing well    Labs/tests ordered:  Lipid Panel in 3 months Next appt:  10/20/2023

## 2023-10-30 ENCOUNTER — Telehealth: Payer: Self-pay

## 2023-10-30 NOTE — Telephone Encounter (Signed)
Patient called to say she seen that her C02 level was high and she would like to know what causes that level to be high and what can be done about. Please advise

## 2023-11-04 NOTE — Telephone Encounter (Signed)
I cannot Explain her Like this. When I run into her in [place] I will try to explain her then Or in next visit.

## 2023-11-04 NOTE — Telephone Encounter (Signed)
Awaiting reply

## 2023-11-05 ENCOUNTER — Other Ambulatory Visit: Payer: Self-pay | Admitting: Adult Health

## 2023-11-05 DIAGNOSIS — G4700 Insomnia, unspecified: Secondary | ICD-10-CM

## 2023-11-05 MED ORDER — ESZOPICLONE 2 MG PO TABS
2.0000 mg | ORAL_TABLET | Freq: Every day | ORAL | 5 refills | Status: DC
Start: 2023-11-05 — End: 2024-05-03

## 2023-11-09 DIAGNOSIS — H401131 Primary open-angle glaucoma, bilateral, mild stage: Secondary | ICD-10-CM | POA: Diagnosis not present

## 2023-11-09 DIAGNOSIS — H04123 Dry eye syndrome of bilateral lacrimal glands: Secondary | ICD-10-CM | POA: Diagnosis not present

## 2023-11-17 ENCOUNTER — Telehealth: Payer: Self-pay | Admitting: Gastroenterology

## 2023-11-17 NOTE — Telephone Encounter (Signed)
Left message on machine to call back  

## 2023-11-17 NOTE — Telephone Encounter (Signed)
11/20/23 at 1030 am appt made with Bayley to discuss abd pain and change in bowels.  The pt has been advised and aware to come in 15 min early.

## 2023-11-17 NOTE — Telephone Encounter (Signed)
Patient called states she is having abdominal pain and is seeking advise.

## 2023-11-18 ENCOUNTER — Non-Acute Institutional Stay: Payer: Self-pay | Admitting: Internal Medicine

## 2023-11-18 ENCOUNTER — Encounter: Payer: Self-pay | Admitting: Internal Medicine

## 2023-11-18 DIAGNOSIS — K5901 Slow transit constipation: Secondary | ICD-10-CM

## 2023-11-18 DIAGNOSIS — K5900 Constipation, unspecified: Secondary | ICD-10-CM | POA: Diagnosis not present

## 2023-11-18 DIAGNOSIS — K562 Volvulus: Secondary | ICD-10-CM | POA: Diagnosis not present

## 2023-11-18 DIAGNOSIS — K56609 Unspecified intestinal obstruction, unspecified as to partial versus complete obstruction: Secondary | ICD-10-CM | POA: Diagnosis not present

## 2023-11-18 NOTE — Progress Notes (Unsigned)
Location:  [facility] Room Number: [phone] Place of Service:  ALF [phone]) Provider:  Mahlon Gammon, MD   Mahlon Gammon, MD  Patient Care Team: Mahlon Gammon, MD as PCP - General (Internal Medicine) Venancio Poisson, MD as Consulting Physician (Dermatology) Eldred Manges, MD as Consulting Physician (Orthopedic Surgery) Sinda Du, MD as Consulting Physician (Ophthalmology) Rachael Fee, MD as Attending Physician (Gastroenterology) Regal, Kirstie Peri, DPM as Consulting Physician (Podiatry) Crista Elliot, MD as Consulting Physician (Urology) Flo Shanks, MD as Consulting Physician (Otolaryngology)  Extended Emergency Contact Information Primary Emergency Contact: Sharol Harness Address: [address]          [place], [place] [address] Darden Amber of [place] Phone: [phone] Relation: Son Secondary Emergency Contact: Myra Gianotti Address: [address]          [place], [place] [address] Darden Amber of [place] Mobile Phone: [phone] Relation: Son  Code Status:  Full Code  Goals of care: Advanced Directive information    11/18/2023    4:31 PM  Advanced Directives  Does Patient Have a Medical Advance Directive? Yes  Type of Estate agent of [place];Living will  Does patient want to make changes to medical advance directive? No - Patient declined  Copy of Healthcare Power of Attorney in Chart? Yes - validated most recent copy scanned in chart (See row information)     Chief Complaint  Patient presents with   Acute Visit    Patient is being seen for severe constipation     HPI:  Pt is a 89 y.o. female seen today for an acute visit for Constipation and Abdominal Pain and Dizziness  [place]ives in [place] in [place]   Patient has h/o Chronic Constipation Patient went out with her son on Thanksgiving.  Next day she woke up and had abdominal pain and nausea and some vomiting.  Since then she has not  been able to have a good bowel movement.  She was given some suppository this morning and it did not help.  She is not eating well either having lower abdominal cramps.  Also feels dizzy but sitting on the commode  Has not had any nausea or vomiting today.  She has made appointment with GI but she will see them on Friday.  No fever no blood in stools.  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   Follows with Urology for Neuropathic Bladder They recommended Botox which she refused  Continues In And Out Cath    Past Medical History:  Diagnosis Date   Allergy    Anxiety    Arthritis    Bursitis    right leg   Family history of kidney cancer    Family history of lung cancer    Family history of uterine cancer    GERD (gastroesophageal reflux disease)    History of blood transfusion    History of uterine cancer 01/19/2019   Hyperlipidemia    Hypertension    Insomnia    Non Hodgkin's lymphoma (HCC) 2017-2019   cured   Osteopenia    MILD   Urine retention    Uterine cancer (HCC)  at age 29   Past Surgical History:  Procedure Laterality Date   ABDOMINAL HYSTERECTOMY     AGE 64   APPENDECTOMY     AGE 21   BLADDER SURGERY     Catarct Surgery     ECTOPIC PREGNANCY SURGERY     age 82   LAPAROSCOPIC SMALL BOWEL RESECTION     NM PET DX LYMPHOMA  10/2018   IN REMISSION   occular pressure     OTHER SURGICAL HISTORY     REPLACEMENT TOTAL KNEE BILATERAL     TONSILLECTOMY AND ADENOIDECTOMY     age 36 or 23   UTERINE CANCER SURGERY     age 55- located in vagina    Allergies  Allergen Reactions   Cefuroxime    Clindamycin/Lincomycin    Levofloxacin    Lisinopril    Prochlorperazine  Edisylate    Psyllium    Sulfa Antibiotics     Outpatient Encounter Medications as of 11/18/2023  Medication Sig   acetaminophen (TYLENOL) 500 MG tablet Take 500 mg by mouth 2 (two) times daily.   acetaminophen (TYLENOL) 500 MG tablet Take 500 mg by mouth every 6 (six) hours as needed.   bimatoprost (LUMIGAN) 0.01 % SOLN Place 1 drop into both eyes at bedtime.   brimonidine (ALPHAGAN) 0.15 % ophthalmic solution 1 drop 3 (three) times daily.   eszopiclone (LUNESTA) 2 MG TABS tablet Take 1 tablet (2 mg total) by mouth at bedtime. Take immediately before bedtime   nitrofurantoin (MACRODANTIN) 50 MG capsule Take 1 capsule (50 mg total) by mouth daily.   ondansetron (ZOFRAN) 4 MG tablet Take 4 mg by mouth every 8 (eight) hours as needed.   cetirizine (ZYRTEC) 10 MG tablet Take 10 mg by mouth at bedtime. (Patient not taking: Reported on 11/18/2023)   Dextromethorphan-guaiFENesin (GUAIFENESIN-DM) 100-10 MG/5ML LIQD Take 5 mLs by mouth every 6 (six) hours as needed. (Patient not taking: Reported on 11/18/2023)   methylcellulose (CITRUCEL) oral powder Take 0.5 packets by mouth daily. (Patient not taking: Reported on 11/18/2023)   No facility-administered encounter medications on file as of 11/18/2023.    Review of Systems  Constitutional:  Positive for activity change and appetite change.  HENT: Negative.    Respiratory:  Negative for cough and shortness of breath.   Cardiovascular:  Negative for leg swelling.  Gastrointestinal:  Positive for abdominal pain and constipation.  Genitourinary: Negative.   Musculoskeletal:  Positive for gait problem. Negative for arthralgias and myalgias.  Skin: Negative.   Neurological:  Positive for weakness. Negative for dizziness.  Psychiatric/Behavioral:  Negative for confusion, dysphoric mood and sleep disturbance.     Immunization History  Administered Date(s) Administered   Fluad Quad(high Dose 65+) 09/19/2022   Fluzone Influenza virus vaccine,trivalent  (IIV3), split virus 08/29/2009, 09/02/2010, 08/30/2012   Influenza, High Dose Seasonal PF 09/26/2014, 08/17/2015, 08/19/2016, 08/20/2017, 08/24/2018, 09/23/2019, 10/05/2020   Influenza-Unspecified 08/20/2017, 10/05/2020, 09/18/2021   Moderna SARS-COV2 Booster Vaccination 05/19/2021, 03/06/2023   Moderna Sars-Covid-2 Vaccination 12/27/2019, 01/24/2020, 10/25/2020, 09/25/2021, 04/30/2022, 11/05/2022   Pneumococcal Conjugate-13 09/07/2014   Pneumococcal Polysaccharide-23 09/21/2007   Tdap 05/23/2010, 05/21/2020   Zoster Recombinant(Shingrix) 09/22/2017, 11/25/2017   Zoster, Live 10/24/2005   Pertinent  Health Maintenance Due  Topic Date Due   INFLUENZA VACCINE  Completed   DEXA SCAN  Completed      04/14/2023    2:30 PM 05/19/2023    8:46 AM 07/20/2023    1:07 PM 08/11/2023   11:34 AM 10/20/2023  1:05 PM  Fall Risk  Falls in the past year? 0 0 0 0 0  Was there an injury with Fall? 0 0 0 0 0  Fall Risk Category Calculator 0 0 0 0 0  Patient at Risk for Falls Due to No Fall Risks No Fall Risks  Impaired balance/gait   Fall risk Follow up Falls evaluation completed Falls evaluation completed Falls evaluation completed Falls evaluation completed;Education provided Falls evaluation completed   Functional Status Survey:    Vitals:   11/18/23 1620  BP: 136/78  Pulse: 70  Resp: (!) 22  Temp: 97.7 F (36.5 C)  TempSrc: Temporal  SpO2: 95%  Weight: 178 lb (80.7 kg)  Height: 5\' 4"  (1.626 m)   Body mass index is 30.55 kg/m. Physical Exam Vitals reviewed.  Constitutional:      Appearance: Normal appearance.  HENT:     Head: Normocephalic.     Nose: Nose normal.     Mouth/Throat:     Mouth: Mucous membranes are moist.     Pharynx: Oropharynx is clear.  Eyes:     Pupils: Pupils are equal, round, and reactive to light.  Cardiovascular:     Rate and Rhythm: Normal rate and regular rhythm.     Pulses: Normal pulses.     Heart sounds: Normal heart sounds. No murmur  heard. Pulmonary:     Effort: Pulmonary effort is normal.     Breath sounds: Normal breath sounds.  Abdominal:     General: Abdomen is flat. Bowel sounds are normal. There is no distension.     Palpations: Abdomen is soft.     Tenderness: There is no abdominal tenderness. There is no guarding.  Musculoskeletal:        General: No swelling.     Cervical back: Neck supple.  Skin:    General: Skin is warm.  Neurological:     General: No focal deficit present.     Mental Status: She is alert and oriented to person, place, and time.  Psychiatric:        Mood and Affect: Mood normal.        Thought Content: Thought content normal.     Labs reviewed: Recent Labs    01/06/23 0000 10/15/23 0000  NA 138 142  K 4.1 4.3  CL 101 106  CO2 22 28*  BUN 9 16  CREATININE 0.7 0.7  CALCIUM 9.7 9.3   Recent Labs    01/06/23 0000 04/14/23 0000 10/15/23 0000  AST 31 31 28   ALT 17 14 19   ALKPHOS 89 91 89  ALBUMIN 4.2  --  3.8   Recent Labs    01/06/23 0000 10/15/23 0000  WBC 5.9 4.5  NEUTROABS 2.70  --   HGB 13.9 12.3  HCT 42 37  PLT 204 159   Lab Results  Component Value Date   TSH 3.54 04/03/2022   No results found for: "HGBA1C" Lab Results  Component Value Date   CHOL 189 04/14/2023   HDL 67 04/14/2023   LDLCALC 102 04/14/2023   TRIG 100 04/14/2023   CHOLHDL 3 08/24/2018    Significant Diagnostic Results in last 30 days:  No results found.  Assessment/Plan 1. Slow transit constipation Will Give her Fleets Enema KUB ordered to rule out Ileus Exam was reassuring Discontinue Citracel Start on Miralax in the morning Continue PO fluids  Tylenol PRN for Abdominal Cramps Xray showed No Obstruction Moderate Stool in Colon.  Family/ staff Communication:  Labs/tests ordered:

## 2023-11-20 ENCOUNTER — Telehealth: Payer: Self-pay | Admitting: Gastroenterology

## 2023-11-20 ENCOUNTER — Ambulatory Visit (INDEPENDENT_AMBULATORY_CARE_PROVIDER_SITE_OTHER): Payer: Medicare Other | Admitting: Gastroenterology

## 2023-11-20 ENCOUNTER — Encounter: Payer: Self-pay | Admitting: Gastroenterology

## 2023-11-20 ENCOUNTER — Ambulatory Visit (INDEPENDENT_AMBULATORY_CARE_PROVIDER_SITE_OTHER)
Admission: RE | Admit: 2023-11-20 | Discharge: 2023-11-20 | Disposition: A | Discharge status: Home / Self Care | Payer: Medicare Other | Source: Ambulatory Visit | Attending: Gastroenterology | Admitting: Gastroenterology

## 2023-11-20 VITALS — BP 138/72 | HR 78 | Ht 64.0 in | Wt 175.0 lb

## 2023-11-20 DIAGNOSIS — K5900 Constipation, unspecified: Secondary | ICD-10-CM

## 2023-11-20 DIAGNOSIS — R1084 Generalized abdominal pain: Secondary | ICD-10-CM | POA: Diagnosis not present

## 2023-11-20 NOTE — Progress Notes (Signed)
Chief Complaint: Constipation and abdominal pain Primary GI MD: Dr. Christella Hartigan (now requesting Dr. Leonides Schanz)  HPI: 89 year old female with medical history as listed below including small bowel resection presents for evaluation of constipation and abdominal pain.  Previously patient of Dr. Christella Hartigan last seen June 2023.  [name] EGD 2018 suggest that she had MALToma.  Treated with H. pylori antibiotics.  Second opinion at [facility].  Repeat EGD [facility] November 2018 showed "lesion had healed and path report showed no evidence of MALToma anymore".  EGD Dr. Christella Hartigan June 2019 found mild nonspecific inflammation in her stomach.  Stomach was biopsied proximally and distally.  Pathology showed no sign of MALToma.   At last appointment she had a change in bowel habits from unclear etiology thought to be from remote pelvic radiation.  She started on Citrucel with adequate improvement.  Discussed the use of AI scribe software for clinical note transcription with the patient, who gave verbal consent to proceed.  The patient, with a history of bowel surgery and subsequent chronic bowel issues, presents with severe abdominal pain and constipation. She reports not having a bowel movement for approximately six days, with the exception of passing two small 'cubes' of stool the day prior to the consultation. The patient describes the pain as significant and notes no passage of gas. This occurred after eating thanksgiving meal.  Attempts to alleviate the constipation, including a suppository and an enema, were unsuccessful and caused considerable discomfort. The patient has self-initiated a diet of liquids, crackers, chicken soup, and Jell-O in the past five days in an attempt to manage the symptoms.  She was previously on Citrucel daily but stopped this when she started having abdominal pain and unable to have a bowel movement.  Her primary care Dr. Chales Abrahams saw her on Wednesday and  performed an abdominal x-ray which we do not have available.  She also reportedly had labs that were normal.  She has not done anything besides 1 suppository 1 enema.     PREVIOUS GI WORKUP   [facility] EGD 2018 suggest that she had MALToma.  Treated with H. pylori antibiotics.  Second opinion at [facility].  Repeat EGD [facility] November 2018 showed "lesion had healed and path report showed no evidence of MALToma anymore".  EGD Dr. Christella Hartigan June 2019 found mild nonspecific inflammation in her stomach.  Stomach was biopsied proximally and distally.  Pathology showed no sign of MALToma.   Past Medical History:  Diagnosis Date   Allergy    Anxiety    Arthritis    Bursitis    right leg   Family history of kidney cancer    Family history of lung cancer    Family history of uterine cancer    GERD (gastroesophageal reflux disease)    History of blood transfusion    History of uterine cancer 01/19/2019   Hyperlipidemia    Hypertension    Insomnia    Non Hodgkin's lymphoma (HCC) 2017-2019   cured   Osteopenia    MILD   Urine retention    Uterine cancer (HCC)    at age 65    Past Surgical History:  Procedure Laterality Date   ABDOMINAL HYSTERECTOMY     AGE 48   APPENDECTOMY     AGE 21   BLADDER SURGERY     Catarct Surgery     ECTOPIC PREGNANCY SURGERY     age 18   LAPAROSCOPIC SMALL BOWEL RESECTION  NM PET DX LYMPHOMA  10/2018   IN REMISSION   occular pressure     OTHER SURGICAL HISTORY     REPLACEMENT TOTAL KNEE BILATERAL     TONSILLECTOMY AND ADENOIDECTOMY     age 86 or 77   UTERINE CANCER SURGERY     age 11- located in vagina    Current Outpatient Medications  Medication Sig Dispense Refill   acetaminophen (TYLENOL) 500 MG tablet Take 500 mg by mouth 2 (two) times daily.     acetaminophen (TYLENOL) 500 MG tablet Take 500 mg by mouth every 6 (six) hours as needed.     bimatoprost (LUMIGAN) 0.01 % SOLN Place 1 drop into both eyes at bedtime.      brimonidine (ALPHAGAN) 0.15 % ophthalmic solution 1 drop 3 (three) times daily.     eszopiclone (LUNESTA) 2 MG TABS tablet Take 1 tablet (2 mg total) by mouth at bedtime. Take immediately before bedtime 30 tablet 5   nitrofurantoin (MACRODANTIN) 50 MG capsule Take 1 capsule (50 mg total) by mouth daily.     ondansetron (ZOFRAN) 4 MG tablet Take 4 mg by mouth every 8 (eight) hours as needed. (Patient not taking: Reported on 11/20/2023)     polyethylene glycol (MIRALAX / GLYCOLAX) 17 g packet Take 17 g by mouth daily. (Patient not taking: Reported on 11/20/2023)     No current facility-administered medications for this visit.    Allergies as of 11/20/2023 - Review Complete 11/20/2023  Allergen Reaction Noted   Cefuroxime  01/01/2021   Clindamycin/lincomycin  01/01/2021   Levofloxacin  01/01/2021   Lisinopril  01/01/2021   Prochlorperazine edisylate  01/01/2021   Psyllium  01/01/2021   Sulfa antibiotics  01/01/2021    Family History  Problem Relation Age of Onset   Heart failure Mother 76   Pneumonia Father 28   Dementia Father    Uterine cancer Sister 2       uterine cancer   Other Sister        Guillain Barre syndrom   Uterine cancer Maternal Grandmother 24   COPD Maternal Grandfather    Lung cancer Maternal Grandfather    Dementia Paternal Grandmother    Kidney cancer Son 74   Lung cancer Maternal Uncle    Colon cancer Neg Hx    Esophageal cancer Neg Hx    Liver cancer Neg Hx    Pancreatic cancer Neg Hx    Rectal cancer Neg Hx    Stomach cancer Neg Hx     Social History   Socioeconomic History   Marital status: Single    Spouse name: Not on file   Number of children: 3   Years of education: Not on file   Highest education level: Not on file  Occupational History   Occupation: retired  Tobacco Use   Smoking status: Never   Smokeless tobacco: Never  Vaping Use   Vaping status: Never Used  Substance and Sexual Activity   Alcohol use: Yes    Alcohol/week:  7.0 standard drinks of alcohol    Types: 7 Glasses of wine per week    Comment: one glass of wine with dinner   Drug use: No   Sexual activity: Not Currently  Other Topics Concern   Not on file  Social History Narrative   Social History      Diet? Low to no fiber diet; lactose (no tolerance), low or no soy      Do you drink/eat things with  caffeine? No liquid caffeine, occasional chocolate      Marital status?                divorced                    What year were you married? n/a      Do you live in a house, apartment, assisted living, condo, trailer, etc.? Apartment/ retirement      Is it one or more stories? one      How many persons live in your home? one      Do you have any pets in your home? (please list) no      Highest level of education completed? Master degree      Current or past profession: Audiologist/ Speech Therapist      Do you exercise?                 yes                     Type & how often? Water aerobics 3 x week      Advanced Directives      Do you have a living will? yes      Do you have a DNR form?        yes                         If not, do you want to discuss one?      Do you have signed POA/HPOA for forms? yes      Functional Status : completed by self      Do you have difficulty bathing or dressing yourself? no      Do you have difficulty preparing food or eating? no      Do you have difficulty managing your medications? No      Do you have difficulty managing your finances? no      Do you have difficulty affording your medications? no   Social Determinants of Health   Financial Resource Strain: Low Risk  (08/11/2023)   Overall Financial Resource Strain (CARDIA)    Difficulty of Paying Living Expenses: Not hard at all  Food Insecurity: No Food Insecurity (08/11/2023)   Hunger Vital Sign    Worried About Running Out of Food in the Last Year: Never true    Ran Out of Food in the Last Year: Never true  Transportation Needs: No  Transportation Needs (08/11/2023)   PRAPARE - Administrator, Civil Service (Medical): No    Lack of Transportation (Non-Medical): No  Physical Activity: Insufficiently Active (08/11/2023)   Exercise Vital Sign    Days of Exercise per Week: 5 days    Minutes of Exercise per Session: 20 min  Stress: No Stress Concern Present (08/11/2023)   [employer] of Occupational Health - Occupational Stress Questionnaire    Feeling of Stress : Not at all  Social Connections: Socially Isolated (08/11/2023)   Social Connection and Isolation Panel [NHANES]    Frequency of Communication with Friends and Family: More than three times a week    Frequency of Social Gatherings with Friends and Family: More than three times a week    Attends Religious Services: Never    Database administrator or Organizations: No    Attends Banker Meetings: Never    Marital Status: Widowed  Intimate Partner Violence: Not At  Risk (08/11/2023)   Humiliation, Afraid, Rape, and Kick questionnaire    Fear of Current or Ex-Partner: No    Emotionally Abused: No    Physically Abused: No    Sexually Abused: No    Review of Systems:    Constitutional: No weight loss, fever, chills, weakness or fatigue HEENT: Eyes: No change in vision               Ears, Nose, Throat:  No change in hearing or congestion Skin: No rash or itching Cardiovascular: No chest pain, chest pressure or palpitations   Respiratory: No SOB or cough Gastrointestinal: See HPI and otherwise negative Genitourinary: No dysuria or change in urinary frequency Neurological: No headache, dizziness or syncope Musculoskeletal: No new muscle or joint pain Hematologic: No bleeding or bruising Psychiatric: No history of depression or anxiety    Physical Exam:  Vital signs: BP 138/72   Pulse 78   Ht 5\' 4"  (1.626 m)   Wt 175 lb (79.4 kg)   BMI 30.04 kg/m   Constitutional: NAD, Well developed, Well nourished, alert and  cooperative Head:  Normocephalic and atraumatic. Eyes:   PEERL, EOMI. No icterus. Conjunctiva pink. Respiratory: Respirations even and unlabored. Lungs clear to auscultation bilaterally.   No wheezes, crackles, or rhonchi.  Cardiovascular:  Regular rate and rhythm. No peripheral edema, cyanosis or pallor.  Gastrointestinal:  Soft, nondistended, nontender. No rebound or guarding. Normal bowel sounds. No appreciable masses or hepatomegaly. Rectal:  Not performed. Patient declined. Msk:  Symmetrical without gross deformities. Without edema, no deformity or joint abnormality.  Neurologic:  Alert and  oriented x4;  grossly normal neurologically.  Skin:   Dry and intact without significant lesions or rashes. Psychiatric: Oriented to person, place and time. Demonstrates good judgement and reason without abnormal affect or behaviors.   RELEVANT LABS AND IMAGING: CBC    Component Value Date/Time   WBC 4.5 10/15/2023 0000   WBC 5.8 12/19/2018 1700   RBC 4.07 10/15/2023 0000   HGB 12.3 10/15/2023 0000   HCT 37 10/15/2023 0000   PLT 159 10/15/2023 0000   MCV 96.6 12/19/2018 1700   MCH 29.5 12/19/2018 1700   MCHC 30.6 12/19/2018 1700   RDW 12.4 12/19/2018 1700   LYMPHSABS 1.0 12/19/2018 1700   MONOABS 0.6 12/19/2018 1700   EOSABS 0.2 12/19/2018 1700   BASOSABS 0.0 12/19/2018 1700    CMP     Component Value Date/Time   NA 142 10/15/2023 0000   K 4.3 10/15/2023 0000   CL 106 10/15/2023 0000   CO2 28 (A) 10/15/2023 0000   GLUCOSE 100 (H) 12/19/2018 1700   BUN 16 10/15/2023 0000   CREATININE 0.7 10/15/2023 0000   CREATININE 0.58 12/19/2018 1700   CALCIUM 9.3 10/15/2023 0000   PROT 6.7 12/19/2018 1700   ALBUMIN 3.8 10/15/2023 0000   AST 28 10/15/2023 0000   ALT 19 10/15/2023 0000   ALKPHOS 89 10/15/2023 0000   BILITOT 0.2 (L) 12/19/2018 1700   GFRNONAA >60 12/19/2018 1700   GFRAA >60 12/19/2018 1700     Assessment/Plan:      Constipation Generalized abdominal pain No bowel  movement for six days with severe abdominal pain and minimal gas passage. Failed suppository and enema. Concern for obstruction.  Will try to obtain x-ray taken 2 days ago. --Order stat abdominal x-ray to rule out obstruction. --Start MiraLax, two capfuls daily.  Also discussed possibility of bowel purge which patient was not interested in. - Also recommended repeating  suppositories and enemas.  She would like to avoid these if possible. --If worsening abdominal pain, recommend hospital evaluation. --If x-ray shows obstruction, recommend hospital evaluation. --If x-ray does not show obstruction, continue MiraLax. --Follow-up with results of x-ray by end of day.      Previous bowel surgery We do not have her previous records but she indicates that she does not have a small bowel anymore.  Previous CT scans note that there is history of bowel anastomosis in the left mid abdomen but do not give specifics.  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  Per Dr. Christella Hartigan previous notes she has had multiple small bowel obstructions and eventual small bowel resection because of radiation to her bowels as well.  All of these happened before she moved here from the [place].  Linda [name] Jolee Ewing [facility] 11/20/2023, 11:24 AM  Cc: Mahlon Gammon, MD

## 2023-11-20 NOTE — Telephone Encounter (Signed)
PT wanted to make provider aware that she was told to stop miralax 2 years ago. Requesting call back

## 2023-11-20 NOTE — Telephone Encounter (Signed)
See imaging result note dated 11/20/23 for additional information.

## 2023-11-20 NOTE — Patient Instructions (Addendum)
VISIT SUMMARY:  You were seen today due to severe abdominal pain and constipation, having not had a proper bowel movement for six days. Despite trying a suppository and an enema, you experienced no relief. You have a history of bowel surgery and chronic bowel issues, and you suspect that a recent Thanksgiving meal may have triggered this episode.  YOUR PLAN:  -CONSTIPATION: Constipation means having difficulty emptying the bowels, often associated with hardened stool. Given your severe abdominal pain and lack of bowel movement, we are concerned about a possible obstruction. We have ordered an urgent abdominal x-ray to check for this. You should start taking MiraLax, two capfuls daily. If your abdominal pain worsens, you should go to the hospital immediately. Depending on the x-ray results, further steps will be taken, and we will follow up with you by the end of the day.  INSTRUCTIONS:  Please follow up with [place] by the end of the day for the results of your abdominal x-ray. If your abdominal pain worsens at any point, seek immediate medical attention at the hospital.  Due to recent changes in healthcare laws, you may see the results of your imaging and laboratory studies on MyChart before your provider has had a chance to review them.  We understand that in some cases there may be results that are confusing or concerning to you. Not all laboratory results come back in the same time frame and the provider may be waiting for multiple results in order to interpret others.  Please give [place] 48 hours in order for your provider to thoroughly review all the results before contacting the office for clarification of your results.    I appreciate the  opportunity to care for you  Thank You   [facility]

## 2023-11-20 NOTE — Telephone Encounter (Signed)
Linda [name]- Apparently patient has not been taking miralax at all. Do you find it appropriate to still have her start at Miralax 2 capsules daily x 3 days and then go to 1 capful daily?

## 2023-11-21 NOTE — Progress Notes (Signed)
I agree with the assessment and plan as outlined by Ms. McMichael. Could also consider pelvic floor PT referral in the future.

## 2023-11-30 ENCOUNTER — Encounter (INDEPENDENT_AMBULATORY_CARE_PROVIDER_SITE_OTHER): Payer: Self-pay

## 2023-11-30 DIAGNOSIS — Z719 Counseling, unspecified: Secondary | ICD-10-CM

## 2023-12-29 ENCOUNTER — Encounter: Payer: Self-pay | Admitting: Orthopedic Surgery

## 2023-12-29 ENCOUNTER — Non-Acute Institutional Stay: Payer: Self-pay | Admitting: Orthopedic Surgery

## 2023-12-29 DIAGNOSIS — K5909 Other constipation: Secondary | ICD-10-CM

## 2023-12-29 DIAGNOSIS — K625 Hemorrhage of anus and rectum: Secondary | ICD-10-CM | POA: Diagnosis not present

## 2023-12-29 DIAGNOSIS — R42 Dizziness and giddiness: Secondary | ICD-10-CM | POA: Diagnosis not present

## 2023-12-29 MED ORDER — PANTOPRAZOLE SODIUM 40 MG IV SOLR: 40.0000 mg | Freq: Every day | INTRAVENOUS | Status: DC

## 2023-12-29 MED ORDER — HYDROCORTISONE ACETATE 25 MG RE SUPP: 25.0000 mg | Freq: Three times a day (TID) | RECTAL | Status: AC

## 2023-12-29 NOTE — Progress Notes (Signed)
 Location:  [facility] Room Number: 623/A Place of Service:  ALF [phone]) Provider:  Greig FORBES Cluster, NP   Charlanne Fredia CROME, MD  Patient Care Team: Charlanne Fredia CROME, MD as PCP - General (Internal Medicine) Cary Doffing, MD as Consulting Physician (Dermatology) Barbarann Oneil BROCKS, MD as Consulting Physician (Orthopedic Surgery) Waylan Cain, MD as Consulting Physician (Ophthalmology) Teressa Toribio SQUIBB, MD (Inactive) as Attending Physician (Gastroenterology) Magdalen Pasco RAMAN, DPM as Consulting Physician (Podiatry) Carolee Sherwood JONETTA DOUGLAS, MD as Consulting Physician (Urology) Arlana Arnt, MD as Consulting Physician (Otolaryngology)  Extended Emergency Contact Information Primary Emergency Contact: Bea Gladis SQUIBB Address: [address]          [place], [place] [address] [place] Mobile Phone: [phone] Relation: Son Secondary Emergency Contact: Bea Toribio PARAS Address: [address]          [place], [place] [address] [place] Mobile Phone: [phone] Relation: Son  Code Status:  Full code Goals of care: Advanced Directive information    11/18/2023    4:31 PM  Advanced Directives  Does Patient Have a Medical Advance Directive? Yes  Type of Estate Agent of [place];Living will  Does patient want to make changes to medical advance directive? No - Patient declined  Copy of Healthcare Power of Attorney in Chart? Yes - validated most recent copy scanned in chart (See row information)     Chief Complaint  Patient presents with   Acute Visit    Rectal bleeding    HPI:  Pt is a 89 y.o. female seen today for acute visit due to rectal bleeding.   She currently resides on the assisted living unit at [facility]. PMH: HTN, venous insufficiency, colitis, constipation, DDD, osteoporosis, vaginal vault cancer, uterine cancer, urinary retention, and glaucoma.   H/o bowel surgery, MALToma with H. 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   Earlier this morning she had a brief moment of abdominal pain and dizziness while she was ambulating to bathroom. She noticed bright red blood in toilet after using bathroom. Abdominal pain and dizziness subsided. She reports multiple incidents of diarrhea within the past 2-3 days. 12/06 she was seen by GI due to ongoing constipation. KUB showed large volume of stool throughout entire colon suggestive for constipation. Advised to start miralax daily and stay hydrated. She has been taking miralax daily. Not on anticoagulants. Afebrile. Vitals stable.   Orthostatic blood pressures: Laying- 180/60 Sitting- 168/80 Standing- 146/58     Past Medical History:  Diagnosis Date   Allergy    Anxiety    Arthritis    Bursitis    right leg   Family history of kidney cancer    Family history of lung cancer    Family history of uterine cancer    GERD (gastroesophageal reflux disease)    History of blood transfusion    History of uterine cancer 01/19/2019   Hyperlipidemia    Hypertension    Insomnia    Non Hodgkin's lymphoma (HCC) 2017-2019   cured   Osteopenia    MILD   Urine retention    Uterine cancer (HCC)    at age 40   Past Surgical History:  Procedure Laterality Date   ABDOMINAL HYSTERECTOMY     AGE 58   APPENDECTOMY     AGE 27   BLADDER SURGERY     Catarct Surgery     ECTOPIC PREGNANCY SURGERY  age 65   LAPAROSCOPIC SMALL BOWEL RESECTION     NM PET DX LYMPHOMA  10/2018   IN REMISSION   occular pressure     OTHER SURGICAL HISTORY     REPLACEMENT TOTAL KNEE BILATERAL     TONSILLECTOMY AND ADENOIDECTOMY     age 20 or 89   UTERINE CANCER SURGERY     age 63- located in vagina    Allergies  Allergen Reactions   Cefuroxime    Clindamycin/Lincomycin    Levofloxacin    Lisinopril    Prochlorperazine Edisylate    Psyllium    Sulfa Antibiotics     Outpatient Encounter Medications as of  12/29/2023  Medication Sig   acetaminophen  (TYLENOL ) 500 MG tablet Take 500 mg by mouth 2 (two) times daily.   acetaminophen  (TYLENOL ) 500 MG tablet Take 500 mg by mouth every 6 (six) hours as needed.   bimatoprost  (LUMIGAN ) 0.01 % SOLN Place 1 drop into both eyes at bedtime.   brimonidine  (ALPHAGAN ) 0.15 % ophthalmic solution 1 drop 3 (three) times daily.   eszopiclone  (LUNESTA ) 2 MG TABS tablet Take 1 tablet (2 mg total) by mouth at bedtime. Take immediately before bedtime   nitrofurantoin  (MACRODANTIN ) 50 MG capsule Take 1 capsule (50 mg total) by mouth daily.   ondansetron  (ZOFRAN ) 4 MG tablet Take 4 mg by mouth every 8 (eight) hours as needed. (Patient not taking: Reported on 11/20/2023)   polyethylene glycol (MIRALAX / GLYCOLAX) 17 g packet Take 17 g by mouth daily. (Patient not taking: Reported on 11/20/2023)   No facility-administered encounter medications on file as of 12/29/2023.    Review of Systems  Constitutional:  Positive for appetite change. Negative for activity change and fever.  HENT:  Negative for sore throat and trouble swallowing.   Eyes:  Negative for visual disturbance.  Respiratory:  Negative for cough and shortness of breath.   Cardiovascular:  Negative for chest pain.  Gastrointestinal:  Positive for abdominal pain, blood in stool, constipation and diarrhea. Negative for abdominal distention, nausea, rectal pain and vomiting.  Genitourinary:  Negative for dysuria.  Musculoskeletal:  Positive for gait problem.  Neurological:  Positive for dizziness and weakness.  Psychiatric/Behavioral:  Negative for confusion and dysphoric mood. The patient is not nervous/anxious.     Immunization History  Administered Date(s) Administered   Fluad Quad(high Dose 65+) 09/19/2022   Fluzone  Influenza virus vaccine,trivalent (IIV3), split virus 08/29/2009, 09/02/2010, 08/30/2012   Influenza, High Dose Seasonal PF 09/26/2014, 08/17/2015, 08/19/2016, 08/20/2017, 08/24/2018, 09/23/2019,  10/05/2020   Influenza-Unspecified 08/20/2017, 10/05/2020, 09/18/2021   Moderna SARS-COV2 Booster Vaccination 05/19/2021, 03/06/2023   Moderna Sars-Covid-2 Vaccination 12/27/2019, 01/24/2020, 10/25/2020, 09/25/2021, 04/30/2022, 11/05/2022   Pneumococcal Conjugate-13 09/07/2014   Pneumococcal Polysaccharide-23 09/21/2007   Tdap 05/23/2010, 05/21/2020   Zoster Recombinant(Shingrix) 09/22/2017, 11/25/2017   Zoster, Live 10/24/2005   Pertinent  Health Maintenance Due  Topic Date Due   INFLUENZA VACCINE  Completed   DEXA SCAN  Completed      04/14/2023    2:30 PM 05/19/2023    8:46 AM 07/20/2023    1:07 PM 08/11/2023   11:34 AM 10/20/2023    1:05 PM  Fall Risk  Falls in the past year? 0 0 0 0 0  Was there an injury with Fall? 0 0 0 0 0  Fall Risk Category Calculator 0 0 0 0 0  Patient at Risk for Falls Due to No Fall Risks No Fall Risks  Impaired balance/gait   Fall risk Follow  up Falls evaluation completed Falls evaluation completed Falls evaluation completed Falls evaluation completed;Education provided Falls evaluation completed   Functional Status Survey:    Vitals:   12/29/23 1902  BP: (!) 146/58  Pulse: (!) 57  Resp: 17  Temp: 97.6 F (36.4 C)  SpO2: 100%  Weight: 178 lb (80.7 kg)  Height: 5' 4 (1.626 m)   Body mass index is 30.55 kg/m. Physical Exam Vitals reviewed.  Constitutional:      General: She is not in acute distress. HENT:     Head: Normocephalic.  Eyes:     General:        Right eye: No discharge.        Left eye: No discharge.  Cardiovascular:     Rate and Rhythm: Normal rate and regular rhythm.     Pulses: Normal pulses.     Heart sounds: Normal heart sounds.  Pulmonary:     Effort: Pulmonary effort is normal. No respiratory distress.     Breath sounds: Normal breath sounds. No wheezing or rales.  Abdominal:     General: There is no distension.     Palpations: Abdomen is soft. There is no mass.     Tenderness: There is no abdominal tenderness.  There is no guarding or rebound.     Hernia: No hernia is present.     Comments: Hypoactive bowel sounds to LLQ and RLQ, no tenderness with palpation  Genitourinary:    Rectum: No tenderness, external hemorrhoid or internal hemorrhoid.  Musculoskeletal:        General: Normal range of motion.     Cervical back: Neck supple.  Skin:    General: Skin is warm.     Capillary Refill: Capillary refill takes less than 2 seconds.  Neurological:     General: No focal deficit present.     Mental Status: She is alert and oriented to person, place, and time.     Gait: Gait abnormal.  Psychiatric:        Mood and Affect: Mood normal.     Labs reviewed: Recent Labs    01/06/23 0000 10/15/23 0000  NA 138 142  K 4.1 4.3  CL 101 106  CO2 22 28*  BUN 9 16  CREATININE 0.7 0.7  CALCIUM  9.7 9.3   Recent Labs    01/06/23 0000 04/14/23 0000 10/15/23 0000  AST 31 31 28   ALT 17 14 19   ALKPHOS 89 91 89  ALBUMIN 4.2  --  3.8   Recent Labs    01/06/23 0000 10/15/23 0000  WBC 5.9 4.5  NEUTROABS 2.70  --   HGB 13.9 12.3  HCT 42 37  PLT 204 159   Lab Results  Component Value Date   TSH 3.54 04/03/2022   No results found for: HGBA1C Lab Results  Component Value Date   CHOL 189 04/14/2023   HDL 67 04/14/2023   LDLCALC 102 04/14/2023   TRIG 100 04/14/2023   CHOLHDL 3 08/24/2018    Significant Diagnostic Results in last 30 days:  No results found.  Assessment/Plan 1. Rectal bleeding (Primary) - BRB per rectum x 2 episodes, diarrhea 2-3 days prior to encounter - intermittent abdominal pain  - hypoactive bowel sounds to LLQ and RLQ, no tenderness with palpation, no hemorrhoids on exam - ? Internal hemorrhoid versus diverticulitis vs GI bleed - stat cbc/diff> hgb 12.5 - start Protonix  for GI protection - start anusol  for possible internal hemorrhoid - recommend ED evaluation if symptoms  persist - pantoprazole  (PROTONIX ) 40 MG injection; Inject 40 mg into the vein daily for  14 days. - hydrocortisone  (ANUSOL -HC) 25 MG suppository; Place 1 suppository (25 mg total) rectally in the morning, at noon, and at bedtime for 10 days.  2. Dizziness - see above - blood pressures stable - orthostatic blood pressures x 1> SBP 180 down to 146 - encourage hydration with water - falls safety discussed  3. Chronic constipation with overflow incontinence - followed by GI - 12/06 KUB> large amount of stool in colon consistent with constipation - recent diarrhea and hypoactive bowel sounds to LLQ and RLQ - consider repeat KUB  - cont miralax  Family/ staff Communication: plan discussed with patient and nurse  Labs/tests ordered:  cbc/diff 01/14 and 01/16

## 2023-12-31 DIAGNOSIS — K625 Hemorrhage of anus and rectum: Secondary | ICD-10-CM | POA: Diagnosis not present

## 2024-01-12 ENCOUNTER — Ambulatory Visit (INDEPENDENT_AMBULATORY_CARE_PROVIDER_SITE_OTHER): Payer: Medicare Other | Admitting: Physician Assistant

## 2024-01-12 ENCOUNTER — Encounter: Payer: Self-pay | Admitting: Physician Assistant

## 2024-01-12 VITALS — BP 112/76 | HR 95

## 2024-01-12 DIAGNOSIS — K625 Hemorrhage of anus and rectum: Secondary | ICD-10-CM | POA: Diagnosis not present

## 2024-01-12 DIAGNOSIS — K582 Mixed irritable bowel syndrome: Secondary | ICD-10-CM

## 2024-01-12 NOTE — Patient Instructions (Signed)
Increase Citrucel to twice daily.   _______________________________________________________  If your blood pressure at your visit was 140/90 or greater, please contact your primary care physician to follow up on this.  _______________________________________________________  If you are age 88 or older, your body mass index should be between 23-30. Your There is no height or weight on file to calculate BMI. If this is out of the aforementioned range listed, please consider follow up with your Primary Care Provider.  If you are age 63 or younger, your body mass index should be between 19-25. Your There is no height or weight on file to calculate BMI. If this is out of the aformentioned range listed, please consider follow up with your Primary Care Provider.   ________________________________________________________  The [facility] providers would like to encourage you to use [facility] to communicate with providers for non-urgent requests or questions.  Due to long hold times on the telephone, sending your provider a message by [facility] may be a faster and more efficient way to get a response.  Please allow 48 business hours for a response.  Please remember that this is for non-urgent requests.  _______________________________________________________

## 2024-01-12 NOTE — Progress Notes (Signed)
Chief Complaint: Rectal bleeding  HPI:    Linda [name] is an 89 year old female, assigned to Dr. Leonides Schanz, with a past medical history as listed below including vaginal cancer treated with surgery and pelvic radiation, history of small bowel obstruction, reflux, non-Hodgkin's lymphoma and multiple others, who was referred to me by Mahlon Gammon, MD for a complaint of rectal bleeding.      11/20/2023 patient seen in clinic by [facility], [facility] for constipation and abdominal pain.  At that time not having a bowel movement for up to 6 days and then passing small "cubes" of stool.  Apparently previously on Citrucel but it stopped and she started having abdominal pain unable to have a bowel movement.  At that time ordered stat abdominal x-ray and started MiraLAX 2 capfuls daily.  Also recommended repeating suppositories and enemas.  Discussed previous bowel surgery.  Apparently bowel anastomosis in the left mid abdomen.    11/20/2023 abdominal x-ray showed large volume of stool throughout the entire colon suggestive for constipation.  Told to start MiraLAX daily.    12/29/2023 patient seen for rectal bleeding by internal medicine.  Noted she currently lives in assisted living at [facility].  Apparently had an episode of abdominal pain and dizziness when going to the bathroom and noticed bright red blood in the toilet.  At that time CBC showed a hemoglobin of 12.5.  Also started on Anusol for hemorrhoids.  As well as Pantoprazole.    Today, patient presents to clinic with her son who assists with history.  She tells me she has always battled with her bowel movements for the past 20 years ever since having part of her colon removed and having radiation for vaginal cancer.  She did well for about 2 years straight on daily Citrucel per recommendations from Dr. Christella Hartigan, but then most recently had an episode of constipation for 5 days, then diarrhea for a few days.  When she experiences these extremes it does  hinder her social life.  Also seems to find that certain foods and things really bother her.  Apparently had an oatmeal raisin cookie last night which caused some vomiting which resolved with Zofran, coffee seems to send her running to the bathroom with diarrhea so she has stopped drinking this lately.  Mostly she stays on Citrucel daily, but in the past has stopped it when she becomes constipated.  She has used MiraLAX as needed which does help to resolve this but causes diarrhea if she stays on it daily.  Describes her rectal bleeding as above, she has not seen any further blood and apparently that Hemoccult testing which was negative as well.  In general just asking how to manage her chronic GI issues.    Denies fever, chills or weight loss.  Previous GI workup: [facility] EGD 2018 suggest that she had MALToma.  Treated with H. pylori antibiotics.  Second opinion at [facility]  Repeat EGD [facility] November 2018 showed "lesion had healed and path report showed no evidence of MALToma anymore".  EGD Dr. Christella Hartigan June 2019 found mild nonspecific inflammation in her stomach.  Stomach was biopsied proximally and distally.  Pathology showed no sign of MALToma.   Past Medical History:  Diagnosis Date   Allergy    Anxiety    Arthritis    Bursitis    right leg   Family history of kidney cancer    Family history of lung cancer    Family history of uterine cancer  GERD (gastroesophageal reflux disease)    History of blood transfusion    History of uterine cancer 01/19/2019   Hyperlipidemia    Hypertension    Insomnia    Non Hodgkin's lymphoma (HCC) 2017-2019   cured   Osteopenia    MILD   Urine retention    Uterine cancer (HCC)    at age 37    Past Surgical History:  Procedure Laterality Date   ABDOMINAL HYSTERECTOMY     AGE 89   APPENDECTOMY     AGE 59   BLADDER SURGERY     Catarct Surgery     ECTOPIC PREGNANCY SURGERY     age 46   LAPAROSCOPIC SMALL BOWEL  RESECTION     NM PET DX LYMPHOMA  10/2018   IN REMISSION   occular pressure     OTHER SURGICAL HISTORY     REPLACEMENT TOTAL KNEE BILATERAL     TONSILLECTOMY AND ADENOIDECTOMY     age 21 or 12   UTERINE CANCER SURGERY     age 10- located in vagina    Current Outpatient Medications  Medication Sig Dispense Refill   acetaminophen (TYLENOL) 500 MG tablet Take 500 mg by mouth 2 (two) times daily.     acetaminophen (TYLENOL) 500 MG tablet Take 500 mg by mouth every 6 (six) hours as needed.     bimatoprost (LUMIGAN) 0.01 % SOLN Place 1 drop into both eyes at bedtime.     brimonidine (ALPHAGAN) 0.15 % ophthalmic solution 1 drop 3 (three) times daily.     eszopiclone (LUNESTA) 2 MG TABS tablet Take 1 tablet (2 mg total) by mouth at bedtime. Take immediately before bedtime 30 tablet 5   nitrofurantoin (MACRODANTIN) 50 MG capsule Take 1 capsule (50 mg total) by mouth daily.     ondansetron (ZOFRAN) 4 MG tablet Take 4 mg by mouth every 8 (eight) hours as needed. (Patient not taking: Reported on 11/20/2023)     pantoprazole (PROTONIX) 40 MG injection Inject 40 mg into the vein daily for 14 days.     polyethylene glycol (MIRALAX / GLYCOLAX) 17 g packet Take 17 g by mouth daily. (Patient not taking: Reported on 11/20/2023)     No current facility-administered medications for this visit.    Allergies as of 01/12/2024 - Review Complete 11/20/2023  Allergen Reaction Noted   Cefuroxime  01/01/2021   Clindamycin/lincomycin  01/01/2021   Levofloxacin  01/01/2021   Lisinopril  01/01/2021   Prochlorperazine edisylate  01/01/2021   Psyllium  01/01/2021   Sulfa antibiotics  01/01/2021    Family History  Problem Relation Age of Onset   Heart failure Mother 38   Pneumonia Father 29   Dementia Father    Uterine cancer Sister 14       uterine cancer   Other Sister        Guillain Barre syndrom   Uterine cancer Maternal Grandmother 50   COPD Maternal Grandfather    Lung cancer Maternal Grandfather     Dementia Paternal Grandmother    Kidney cancer Son 62   Lung cancer Maternal Uncle    Colon cancer Neg Hx    Esophageal cancer Neg Hx    Liver cancer Neg Hx    Pancreatic cancer Neg Hx    Rectal cancer Neg Hx    Stomach cancer Neg Hx     Social History   Socioeconomic History   Marital status: Single    Spouse name: Not on file  Number of children: 3   Years of education: Not on file   Highest education level: Not on file  Occupational History   Occupation: retired  Tobacco Use   Smoking status: Never   Smokeless tobacco: Never  Vaping Use   Vaping status: Never Used  Substance and Sexual Activity   Alcohol use: Yes    Alcohol/week: 7.0 standard drinks of alcohol    Types: 7 Glasses of wine per week    Comment: one glass of wine with dinner   Drug use: No   Sexual activity: Not Currently  Other Topics Concern   Not on file  Social History Narrative   Social History      Diet? Low to no fiber diet; lactose (no tolerance), low or no soy      Do you drink/eat things with caffeine? No liquid caffeine, occasional chocolate      Marital status?                divorced                    What year were you married? n/a      Do you live in a house, apartment, assisted living, condo, trailer, etc.? Apartment/ retirement      Is it one or more stories? one      How many persons live in your home? one      Do you have any pets in your home? (please list) no      Highest level of education completed? Master degree      Current or past profession: Audiologist/ Speech Therapist      Do you exercise?                 yes                     Type & how often? Water aerobics 3 x week      Advanced Directives      Do you have a living will? yes      Do you have a DNR form?        yes                         If not, do you want to discuss one?      Do you have signed POA/HPOA for forms? yes      Functional Status : completed by self      Do you have difficulty  bathing or dressing yourself? no      Do you have difficulty preparing food or eating? no      Do you have difficulty managing your medications? No      Do you have difficulty managing your finances? no      Do you have difficulty affording your medications? no   Social Drivers of Corporate investment banker Strain: Low Risk  (08/11/2023)   Overall Financial Resource Strain (CARDIA)    Difficulty of Paying Living Expenses: Not hard at all  Food Insecurity: No Food Insecurity (08/11/2023)   Hunger Vital Sign    Worried About Running Out of Food in the Last Year: Never true    Ran Out of Food in the Last Year: Never true  Transportation Needs: No Transportation Needs (08/11/2023)   PRAPARE - Administrator, Civil Service (Medical): No    Lack of Transportation (Non-Medical): No  Physical Activity: Insufficiently Active (08/11/2023)   Exercise Vital Sign    Days of Exercise per Week: 5 days    Minutes of Exercise per Session: 20 min  Stress: No Stress Concern Present (08/11/2023)   [employer] of Occupational Health - Occupational Stress Questionnaire    Feeling of Stress : Not at all  Social Connections: Socially Isolated (08/11/2023)   Social Connection and Isolation Panel [NHANES]    Frequency of Communication with Friends and Family: More than three times a week    Frequency of Social Gatherings with Friends and Family: More than three times a week    Attends Religious Services: Never    Database administrator or Organizations: No    Attends Banker Meetings: Never    Marital Status: Widowed  Intimate Partner Violence: Not At Risk (08/11/2023)   Humiliation, Afraid, Rape, and Kick questionnaire    Fear of Current or Ex-Partner: No    Emotionally Abused: No    Physically Abused: No    Sexually Abused: No    Review of Systems:    Constitutional: No weight loss, fever or chills Cardiovascular: No chest pain Respiratory: No SOB  Gastrointestinal:  See HPI and otherwise negative   Physical Exam:  Vital signs: BP 112/76   Pulse 95   SpO2 96%    Constitutional:   Pleasant elderly Caucasian female appears to be in NAD, Well developed, Well nourished, alert and cooperative  Respiratory: Respirations even and unlabored. Lungs clear to auscultation bilaterally.   No wheezes, crackles, or rhonchi.  Cardiovascular: Normal S1, S2. No MRG. Regular rate and rhythm. No peripheral edema, cyanosis or pallor.  Gastrointestinal:  Soft, nondistended, nontender. No rebound or guarding. Normal bowel sounds. No appreciable masses or hepatomegaly. Rectal: Declined-tells me she no longer has a problem Msk:  Symmetrical without gross deformities. Without edema, no deformity or joint abnormality. +uses walker to ambulate Psychiatric: Oriented to person, place and time. Demonstrates good judgement and reason without abnormal affect or behaviors.  RELEVANT LABS AND IMAGING: CBC    Component Value Date/Time   WBC 4.5 10/15/2023 0000   WBC 5.8 12/19/2018 1700   RBC 4.07 10/15/2023 0000   HGB 12.3 10/15/2023 0000   HCT 37 10/15/2023 0000   PLT 159 10/15/2023 0000   MCV 96.6 12/19/2018 1700   MCH 29.5 12/19/2018 1700   MCHC 30.6 12/19/2018 1700   RDW 12.4 12/19/2018 1700   LYMPHSABS 1.0 12/19/2018 1700   MONOABS 0.6 12/19/2018 1700   EOSABS 0.2 12/19/2018 1700   BASOSABS 0.0 12/19/2018 1700    CMP     Component Value Date/Time   NA 142 10/15/2023 0000   K 4.3 10/15/2023 0000   CL 106 10/15/2023 0000   CO2 28 (A) 10/15/2023 0000   GLUCOSE 100 (H) 12/19/2018 1700   BUN 16 10/15/2023 0000   CREATININE 0.7 10/15/2023 0000   CREATININE 0.58 12/19/2018 1700   CALCIUM 9.3 10/15/2023 0000   PROT 6.7 12/19/2018 1700   ALBUMIN 3.8 10/15/2023 0000   AST 28 10/15/2023 0000   ALT 19 10/15/2023 0000   ALKPHOS 89 10/15/2023 0000   BILITOT 0.2 (L) 12/19/2018 1700   GFRNONAA >60 12/19/2018 1700   GFRAA >60 12/19/2018 1700    Assessment: 1.   IBS-mixed: Patient has a lot of variance in stool with different things she eats, is very sensitive, has had prior hemicolectomy as well as radiation to her pelvis which likely contribute 2.  Rectal bleeding:  Seen in previous reports, patient tells me she is no longer having any bleeding it was just 2 times after being constipated, apparently had Hemoccult studies afterwards which are normal as well as labs, given hemorrhoid cream and Pantoprazole, has finished course of Pantoprazole; most likely hemorrhoids  Plan: 1.  Can discontinue Pantoprazole, BUN was normal as well as hemoglobin, less likely an upper GI bleed recently with only 2 episodes of bright red blood.  More likely hemorrhoids.  Discussed this with the patient and her son. 2.  Discussed with patient that she has had 20 years of these GI symptoms, likely she will continue to do so throughout the rest of her life.  It is good of her to keep a journal/log of what she is eating and how her bowel movements are doing if this does not bother her to do so, so she can rule out certain foods that give her issues. 3.  Recommend she trial an increase in her fiber supplement, Citrucel to twice daily to see if this helps at all with the variance back-and-forth.  This is something she should continue long-term. 4.  Encouraged her to continue increased water intake of at least 6-8 eight ounce glasses a day 5.  Can keep MiraLAX in her toolbox to use as needed if she goes 2 to 3 days without a bowel movement then would start once daily until she has a bowel movement, then can discontinue.  In the same way can use Imodium if she has too many days of diarrhea. 6.  Patient requested a 83-month follow-up with Dr. Leonides Schanz.  Will put her in recall for this.  Otherwise patient will call if needed.  Hyacinth Meeker, PA-C [facility] 01/12/2024, 9:04 AM  Cc: Mahlon Gammon, MD

## 2024-01-13 NOTE — Progress Notes (Signed)
I agree with the assessment and plan as outlined by Ms. Lemmon.

## 2024-01-18 ENCOUNTER — Encounter (INDEPENDENT_AMBULATORY_CARE_PROVIDER_SITE_OTHER): Payer: Self-pay

## 2024-01-18 DIAGNOSIS — Z719 Counseling, unspecified: Secondary | ICD-10-CM

## 2024-01-22 DIAGNOSIS — E785 Hyperlipidemia, unspecified: Secondary | ICD-10-CM | POA: Diagnosis not present

## 2024-01-22 LAB — LIPID PANEL
Cholesterol: 196 (ref 0–200)
HDL: 44 (ref 35–70)
LDL Cholesterol: 129
LDl/HDL Ratio: 4.5
Triglycerides: 114 (ref 40–160)

## 2024-01-25 ENCOUNTER — Encounter: Payer: Self-pay | Admitting: Adult Health

## 2024-01-25 ENCOUNTER — Non-Acute Institutional Stay: Payer: Medicare Other | Admitting: Adult Health

## 2024-01-25 VITALS — BP 130/74 | HR 55 | Temp 97.6°F | Ht 64.0 in

## 2024-01-25 DIAGNOSIS — E782 Mixed hyperlipidemia: Secondary | ICD-10-CM

## 2024-01-25 DIAGNOSIS — R2681 Unsteadiness on feet: Secondary | ICD-10-CM | POA: Diagnosis not present

## 2024-01-25 DIAGNOSIS — H409 Unspecified glaucoma: Secondary | ICD-10-CM | POA: Diagnosis not present

## 2024-01-25 DIAGNOSIS — K581 Irritable bowel syndrome with constipation: Secondary | ICD-10-CM | POA: Diagnosis not present

## 2024-01-25 DIAGNOSIS — N319 Neuromuscular dysfunction of bladder, unspecified: Secondary | ICD-10-CM | POA: Diagnosis not present

## 2024-01-25 MED ORDER — SYSTANE 0.4-0.3 % OP SOLN: 1.0000 [drp] | Freq: Three times a day (TID) | OPHTHALMIC | Status: DC

## 2024-01-25 MED ORDER — CYCLOSPORINE 0.05 % OP EMUL: 2.0000 [drp] | Freq: Two times a day (BID) | OPHTHALMIC | Status: DC

## 2024-01-25 NOTE — Progress Notes (Signed)
 Location:  [facility]  POS: Clinic  Provider: Raylene Calamity, ANP   Goals of Care:     01/25/2024    2:09 PM  Advanced Directives  Does Patient Have a Medical Advance Directive? Yes  Type of Estate agent of [place];Living will  Does patient want to make changes to medical advance directive? No - Patient declined  Copy of Healthcare Power of Attorney in Chart? Yes - validated most recent copy scanned in chart (See row information)     Chief Complaint  Patient presents with   Medical Management of Chronic Issues    3 month follow up. Discuss the need for covid.    HPI: Patient is a 89 y.o. female seen today for medical management of chronic diseases.    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       Discussed the use of AI scribe software for clinical note transcription with the patient, who gave verbal consent to proceed.  History of Present Illness   Linda [name] is an 89 year old female here for f/u regarding medical management  She has a history of irritable bowel syndrome (IBS) with mixed symptoms, including constipation and occasional rectal bleeding. Around Thanksgiving, she experienced constipation and pain, which was managed with Citrucel and an enema. She noted a drop of red blood which was reported to her healthcare providers. Stool testing returned negative for blood. She was seen by a physician assistant at her gastroenterologist's office, who ordered x-rays and confirmed the resolution of her symptoms. She is currently managing her IBS with Citrucel twice daily and maintains a food diary to track her bowel movements. She has stopped consuming coffee and wine. No current blood in stool, abdominal pain, or irregular bowel  movements.  She has a history of radiation damage to her bladder and bowel, which has contributed to her gastrointestinal symptoms. She uses a catheter for urinary management and reports no current issues with urinary tract infections or catheter use.  She experienced a large bruise following a blood draw, which was performed at her residence instead of the office as per her usual routine. This has happened before but not in the same location on her arm.  Her current medications include Restasis  eye drops twice daily, alphagan , and lumigan . She also has a prescription for Zofran , which was used for nausea during a recent episode.  She resides in an assisted living facility and is actively involved in her own care. She participates in exercise classes and uses a scooter for mobility outside her apartment. She has three sons, one of whom assists her with medical appointments.     HLD off statin due to her request LDL 129 Continues to self cath due to neurogenic bladder Wearing a pull up  Seen by GI due to bloody stools which resolved. Felt to be due to hemorrhoids, IBS and constipation. Also has IBS with variable stools quality.    OP: 10/04/19: T score -2.1 showing osteopenia with prior hx of compression fx.  Has chosen not to pursue medication for this in the past   Weight training and goes to the swimming pool for exercise   Mammogram: aged out Past Medical History:  Diagnosis Date   Allergy    Anxiety    Arthritis    Bursitis  right leg   Family history of kidney cancer    Family history of lung cancer    Family history of uterine cancer    GERD (gastroesophageal reflux disease)    History of blood transfusion    History of uterine cancer 01/19/2019   Hyperlipidemia    Hypertension    Insomnia    Non Hodgkin's lymphoma (HCC) 2017-2019   cured   Osteopenia    MILD   Urine retention    Uterine cancer (HCC)    at age 86    Past Surgical History:  Procedure Laterality  Date   ABDOMINAL HYSTERECTOMY     AGE 59   APPENDECTOMY     AGE 7   BLADDER SURGERY     Catarct Surgery     ECTOPIC PREGNANCY SURGERY     age 40   LAPAROSCOPIC SMALL BOWEL RESECTION     NM PET DX LYMPHOMA  10/2018   IN REMISSION   occular pressure     OTHER SURGICAL HISTORY     REPLACEMENT TOTAL KNEE BILATERAL     TONSILLECTOMY AND ADENOIDECTOMY     age 7 or 46   UTERINE CANCER SURGERY     age 89- located in vagina    Allergies  Allergen Reactions   Cefuroxime    Clindamycin/Lincomycin    Levofloxacin    Lisinopril    Prochlorperazine Edisylate    Psyllium    Sulfa Antibiotics     Outpatient Encounter Medications as of 01/25/2024  Medication Sig   acetaminophen  (TYLENOL ) 500 MG tablet Take 500 mg by mouth every 6 (six) hours as needed.   bimatoprost  (LUMIGAN ) 0.01 % SOLN Place 1 drop into both eyes at bedtime.   brimonidine  (ALPHAGAN ) 0.15 % ophthalmic solution 1 drop 3 (three) times daily.   eszopiclone  (LUNESTA ) 2 MG TABS tablet Take 1 tablet (2 mg total) by mouth at bedtime. Take immediately before bedtime   hydrocortisone  (ANUSOL -HC) 25 MG suppository Place 25 mg rectally every 8 (eight) hours as needed for hemorrhoids or anal itching.   loperamide (IMODIUM) 1 MG/5ML solution Take 2 mg by mouth as needed for diarrhea or loose stools.   Methylcellulose, Laxative, (CITRUCEL PO) Take 1 Dose by mouth daily.   nitrofurantoin  (MACRODANTIN ) 50 MG capsule Take 1 capsule (50 mg total) by mouth daily.   ondansetron  (ZOFRAN ) 4 MG tablet Take 4 mg by mouth every 8 (eight) hours as needed.   polyethylene glycol (MIRALAX / GLYCOLAX) 17 g packet Take 17 g by mouth as needed.   olopatadine (PATADAY) 0.1 % ophthalmic solution Place 1 drop into both eyes daily. (Patient not taking: Reported on 01/25/2024)   RESTASIS  0.05 % ophthalmic emulsion Place 2 drops into both eyes 2 (two) times daily. (Patient not taking: Reported on 01/25/2024)   No facility-administered encounter medications on  file as of 01/25/2024.    Review of Systems:  Review of Systems  Constitutional:  Negative for activity change, appetite change, chills, diaphoresis, fatigue, fever and unexpected weight change.  HENT:  Positive for hearing loss. Negative for congestion.   Respiratory:  Negative for cough, shortness of breath and wheezing.   Cardiovascular:  Negative for chest pain, palpitations and leg swelling.  Gastrointestinal:  Negative for abdominal distention, abdominal pain, constipation and diarrhea.  Genitourinary:  Negative for difficulty urinating and dysuria.  Musculoskeletal:  Positive for gait problem. Negative for arthralgias, back pain, joint swelling and myalgias.  Neurological:  Negative for dizziness, tremors, seizures, syncope, facial asymmetry,  speech difficulty, weakness, light-headedness, numbness and headaches.  Psychiatric/Behavioral:  Negative for agitation, behavioral problems and confusion.     Health Maintenance  Topic Date Due   COVID-19 Vaccine (7 - 2024-25 season) 08/16/2023   Medicare Annual Wellness (AWV)  08/10/2024   DTaP/Tdap/Td (3 - Td or Tdap) 05/21/2030   Pneumonia Vaccine 22+ Years old  Completed   INFLUENZA VACCINE  Completed   DEXA SCAN  Completed   Zoster Vaccines- Shingrix  Completed   HPV VACCINES  Aged Out    Physical Exam: Vitals:   01/25/24 1356  BP: 130/74  Pulse: (!) 55  Temp: 97.6 F (36.4 C)  SpO2: 94%  Height: 5\' 4"  (1.626 m)    Body mass index is 30.55 kg/m. Physical Exam Vitals and nursing note reviewed.  Constitutional:      General: She is not in acute distress.    Appearance: She is not diaphoretic.  HENT:     Head: Normocephalic and atraumatic.     Nose: Nose normal.     Mouth/Throat:     Mouth: Mucous membranes are moist.     Pharynx: Oropharynx is clear. No oropharyngeal exudate.  Eyes:     General:        Right eye: No discharge.        Left eye: No discharge.     Conjunctiva/sclera: Conjunctivae normal.      Pupils: Pupils are equal, round, and reactive to light.  Neck:     Vascular: No JVD.  Cardiovascular:     Rate and Rhythm: Normal rate and regular rhythm.     Heart sounds: No murmur heard. Pulmonary:     Effort: Pulmonary effort is normal. No respiratory distress.     Breath sounds: Normal breath sounds. No wheezing.  Abdominal:     General: Bowel sounds are normal. There is no distension.     Palpations: Abdomen is soft. There is no mass.     Tenderness: There is no abdominal tenderness. There is no guarding.  Musculoskeletal:        General: No swelling, tenderness, deformity or signs of injury.     Cervical back: No rigidity or tenderness.     Comments: Trace edema to ankles  Lymphadenopathy:     Cervical: No cervical adenopathy.  Skin:    General: Skin is warm and dry.  Neurological:     Mental Status: She is alert and oriented to person, place, and time.  Psychiatric:        Mood and Affect: Mood normal.     Labs reviewed: Basic Metabolic Panel: Recent Labs    10/15/23 0000  NA 142  K 4.3  CL 106  CO2 28*  BUN 16  CREATININE 0.7  CALCIUM  9.3   Liver Function Tests: Recent Labs    04/14/23 0000 10/15/23 0000  AST 31 28  ALT 14 19  ALKPHOS 91 89  ALBUMIN  --  3.8   No results for input(s): "LIPASE", "AMYLASE" in the last 8760 hours. No results for input(s): "AMMONIA" in the last 8760 hours. CBC: Recent Labs    10/15/23 0000  WBC 4.5  HGB 12.3  HCT 37  PLT 159   Lipid Panel: Recent Labs    04/14/23 0000  CHOL 189  HDL 67  LDLCALC 102  TRIG 010   No results found for: "HGBA1C"  Procedures since last visit: No results found.  Assessment/Plan  Assessment and Plan    Hyperlipidemia LDL slightly elevated  at 129 off statin therapy. Patient is not currently on any lipid-lowering medications. -Consider rechecking lipid panel in 4 months.  Irritable Bowel Syndrome Recent episode of constipation with associated abdominal pain, now  resolved. No current abdominal pain or blood in stool. Bowels moving regularly. -Continue current management with Citrucel and Miralax as needed. -Continue monitoring bowel movements and maintaining food diary.  Glaucoma Patient on multiple eye drops including Restasis , Systane, Lumigan , and Alphagan . Followed by ophthalmology   Insomnia Managed with Lunesta . -Continue Lunesta  for sleep.  General Health Maintenance -Up to date on vaccinations. -Continue regular exercise as tolerated with cane, walker, or scooter as needed for mobility. -Plan for follow-up in 4 months with Dr. Venice Gillis.      Gait instability Using cane or walker for shorter distances Motorized chair for longer distances  Neurogenic bladder Continue I and O cath     Labs/tests ordered:  * No order type specified *CBC BMP Lipid prior to next apt to be [place] in [place] setting prior [place]  Next appt:  4 months with Dr Venice Gillis   Total time :  time greater than 50% of total time spent doing pt counseling and coordination of care

## 2024-02-15 DIAGNOSIS — H5203 Hypermetropia, bilateral: Secondary | ICD-10-CM | POA: Diagnosis not present

## 2024-02-15 DIAGNOSIS — H401131 Primary open-angle glaucoma, bilateral, mild stage: Secondary | ICD-10-CM | POA: Diagnosis not present

## 2024-02-22 ENCOUNTER — Telehealth: Payer: Self-pay | Admitting: Physician Assistant

## 2024-02-22 NOTE — Telephone Encounter (Signed)
 Patient called requesting to follow up with Dr. Leonides Schanz only said she has seen two PA's since Dr. Christella Hartigan left. Please assist

## 2024-02-23 NOTE — Telephone Encounter (Signed)
 Pt stated that she would like to be scheduled to see an Dr instead of a PA. Pt stated that she was not having any GI symptoms at the moment.  Pt was scheduled to see Dr. Leonides Schanz on 05-11-2024 at 9:30 AM. Pt made aware. Pt verbalized understanding with all questions answered.

## 2024-03-01 DIAGNOSIS — R2681 Unsteadiness on feet: Secondary | ICD-10-CM | POA: Diagnosis not present

## 2024-03-01 DIAGNOSIS — R278 Other lack of coordination: Secondary | ICD-10-CM | POA: Diagnosis not present

## 2024-03-03 DIAGNOSIS — R2681 Unsteadiness on feet: Secondary | ICD-10-CM | POA: Diagnosis not present

## 2024-03-03 DIAGNOSIS — R278 Other lack of coordination: Secondary | ICD-10-CM | POA: Diagnosis not present

## 2024-03-04 DIAGNOSIS — R2681 Unsteadiness on feet: Secondary | ICD-10-CM | POA: Diagnosis not present

## 2024-03-04 DIAGNOSIS — R278 Other lack of coordination: Secondary | ICD-10-CM | POA: Diagnosis not present

## 2024-03-08 DIAGNOSIS — R278 Other lack of coordination: Secondary | ICD-10-CM | POA: Diagnosis not present

## 2024-03-08 DIAGNOSIS — R2681 Unsteadiness on feet: Secondary | ICD-10-CM | POA: Diagnosis not present

## 2024-03-14 DIAGNOSIS — Z719 Counseling, unspecified: Secondary | ICD-10-CM

## 2024-04-12 DIAGNOSIS — H401131 Primary open-angle glaucoma, bilateral, mild stage: Secondary | ICD-10-CM | POA: Diagnosis not present

## 2024-04-12 DIAGNOSIS — H04123 Dry eye syndrome of bilateral lacrimal glands: Secondary | ICD-10-CM | POA: Diagnosis not present

## 2024-05-03 ENCOUNTER — Other Ambulatory Visit: Payer: Self-pay | Admitting: Orthopedic Surgery

## 2024-05-03 DIAGNOSIS — G4700 Insomnia, unspecified: Secondary | ICD-10-CM

## 2024-05-03 MED ORDER — ESZOPICLONE 2 MG PO TABS: 2.0000 mg | ORAL_TABLET | Freq: Every day | ORAL | 5 refills | Status: DC

## 2024-05-11 ENCOUNTER — Encounter: Payer: Self-pay | Admitting: Internal Medicine

## 2024-05-11 ENCOUNTER — Ambulatory Visit (INDEPENDENT_AMBULATORY_CARE_PROVIDER_SITE_OTHER): Admitting: Internal Medicine

## 2024-05-11 VITALS — BP 120/78 | HR 70

## 2024-05-11 DIAGNOSIS — K5900 Constipation, unspecified: Secondary | ICD-10-CM

## 2024-05-11 DIAGNOSIS — K582 Mixed irritable bowel syndrome: Secondary | ICD-10-CM

## 2024-05-11 DIAGNOSIS — K625 Hemorrhage of anus and rectum: Secondary | ICD-10-CM | POA: Diagnosis not present

## 2024-05-11 NOTE — Patient Instructions (Addendum)
 _______________________________________________________  If your blood pressure at your visit was 140/90 or greater, please contact your primary care physician to follow up on this. _______________________________________________________  If you are age 88 or older, your body mass index should be between 23-30. Your There is no height or weight on file to calculate BMI. If this is out of the aforementioned range listed, please consider follow up with your Primary Care Provider. ________________________________________________________  The [facility] providers would like to encourage you to use MYCHART to communicate with providers for non-urgent requests or questions.  Due to long hold times on the telephone, sending your provider a message by [facility] may be a faster and more efficient way to get a response.  Please allow 48 business hours for a response.  Please remember that this is for non-urgent requests.  _______________________________________________________  Plan: - Continue daily Citrucel  - Continue to use Miralax if no BM in 3 days - Continue use Imodium if diarrhea for at least 2 days - Continue food and BM diary - RTC 6 months  Thank you for entrusting me with your care and choosing [facility].  Dr Rosaline Coma

## 2024-05-11 NOTE — Progress Notes (Signed)
 Chief Complaint: Rectal bleeding  HPI:    Linda [name] is an 89 year old female with history of vaginal cancer treated with surgery and pelvic radiation, history of small bowel obstruction, reflux, non-Hodgkin's lymphoma presents for follow up of IBS and hemorrhoids  Interval History: She presents with her son to clinic today. She is doing very well. Bowel habits are regular. She keeps close watch on her stools as well as what she eats every day, keeping up a diary. Her memory has gotten slightly worse as she has gotten older so writing things down tends to really help us . She takes her Citrucel regularly every day. She has been diligent about this. She has stopped her glass of red wine and decaf coffee, which seemed to cause more bowel irregularity. She is no longer having any rectal bleeding. Denies acid reflux. If she doesn't have a stool every 3 days, then she takes Miralax. She has been drinking prune juice. If she has diarrhea for 2 days, then she will take a dose of Imodium. She has been doing more yoga as a replacement for the aquatics classes she did previously. She lives at [facility].  Previous GI workup: [place]  EGD 2018 suggest that she had MALToma.  Treated with H. pylori antibiotics.  Second opinion at [facility].  Repeat EGD [facility] [place]  November 2018 showed "lesion had healed and path report showed no evidence of MALToma anymore".  EGD Dr. Howard Macho June 2019 found mild nonspecific inflammation in her stomach.  Stomach was biopsied proximally and distally.  Pathology showed no sign of MALToma.   Past Medical History:  Diagnosis Date   Allergy    Anxiety    Arthritis    Bursitis    right leg   Family history of kidney cancer    Family history of lung cancer    Family history of uterine cancer    GERD (gastroesophageal reflux disease)    History of blood transfusion    History of uterine cancer 01/19/2019   Hyperlipidemia    Hypertension     Insomnia    Non Hodgkin's lymphoma (HCC) 2017-2019   cured   Osteopenia    MILD   Urine retention    Uterine cancer (HCC)    at age 4    Past Surgical History:  Procedure Laterality Date   ABDOMINAL HYSTERECTOMY     AGE 84   APPENDECTOMY     AGE 38   BLADDER SURGERY     Catarct Surgery     ECTOPIC PREGNANCY SURGERY     age 20   LAPAROSCOPIC SMALL BOWEL RESECTION     NM PET DX LYMPHOMA  10/2018   IN REMISSION   occular pressure     OTHER SURGICAL HISTORY     REPLACEMENT TOTAL KNEE BILATERAL     TONSILLECTOMY AND ADENOIDECTOMY     age 89 or 73   UTERINE CANCER SURGERY     age 12- located in vagina    Current Outpatient Medications  Medication Sig Dispense Refill   acetaminophen  (TYLENOL ) 500 MG tablet Take 500 mg by mouth every 6 (six) hours as needed.     bimatoprost  (LUMIGAN ) 0.01 % SOLN Place 1 drop into both eyes at bedtime.     brimonidine  (ALPHAGAN ) 0.15 % ophthalmic solution 1 drop 3 (three) times daily.     cycloSPORINE  (RESTASIS ) 0.05 % ophthalmic emulsion Place 2 drops into both eyes 2 (two) times daily.     eszopiclone  (LUNESTA ) 2 MG TABS  tablet Take 1 tablet (2 mg total) by mouth at bedtime. Take immediately before bedtime 30 tablet 5   loperamide (IMODIUM) 1 MG/5ML solution Take 2 mg by mouth as needed for diarrhea or loose stools.     Methylcellulose, Laxative, (CITRUCEL PO) Take 1 Dose by mouth daily.     nitrofurantoin  (MACRODANTIN ) 50 MG capsule Take 1 capsule (50 mg total) by mouth daily.     ondansetron  (ZOFRAN ) 4 MG tablet Take 4 mg by mouth every 8 (eight) hours as needed.     Polyethyl Glycol-Propyl Glycol (SYSTANE) 0.4-0.3 % SOLN Apply 1 drop to eye in the morning, at noon, and at bedtime.     polyethylene glycol (MIRALAX / GLYCOLAX) 17 g packet Take 17 g by mouth as needed.     No current facility-administered medications for this visit.    Allergies as of 05/11/2024 - Review Complete 05/11/2024  Allergen Reaction Noted   Cefuroxime  01/01/2021    Clindamycin/lincomycin  01/01/2021   Levofloxacin  01/01/2021   Lisinopril  01/01/2021   Prochlorperazine edisylate  01/01/2021   Psyllium  01/01/2021   Sulfa antibiotics  01/01/2021    Family History  Problem Relation Age of Onset   Heart failure Mother 77   Pneumonia Father 51   Dementia Father    Uterine cancer Sister 63       uterine cancer   Other Sister        Guillain Barre syndrom   Uterine cancer Maternal Grandmother 54   COPD Maternal Grandfather    Lung cancer Maternal Grandfather    Dementia Paternal Grandmother    Kidney cancer Son 53   Lung cancer Maternal Uncle    Colon cancer Neg Hx    Esophageal cancer Neg Hx    Liver cancer Neg Hx    Pancreatic cancer Neg Hx    Rectal cancer Neg Hx    Stomach cancer Neg Hx     Social History   Socioeconomic History   Marital status: Single    Spouse name: Not on file   Number of children: 3   Years of education: Not on file   Highest education level: Not on file  Occupational History   Occupation: retired  Tobacco Use   Smoking status: Never   Smokeless tobacco: Never  Vaping Use   Vaping status: Never Used  Substance and Sexual Activity   Alcohol use: Yes    Alcohol/week: 7.0 standard drinks of alcohol    Types: 7 Glasses of wine per week    Comment: one glass of wine with dinner   Drug use: No   Sexual activity: Not Currently  Other Topics Concern   Not on file  Social History Narrative   Social History      Diet? Low to no fiber diet; lactose (no tolerance), low or no soy      Do you drink/eat things with caffeine? No liquid caffeine, occasional chocolate      Marital status?                divorced                    What year were you married? n/a      Do you live in a house, apartment, assisted living, condo, trailer, etc.? Apartment/ retirement      Is it one or more stories? one      How many persons live in your home? one  Do you have any pets in your home? (please list) no       Highest level of education completed? Master degree      Current or past profession: Audiologist/ Speech Therapist      Do you exercise?                 yes                     Type & how often? Water aerobics 3 x week      Advanced Directives      Do you have a living will? yes      Do you have a DNR form?        yes                         If not, do you want to discuss one?      Do you have signed POA/HPOA for forms? yes      Functional Status : completed by self      Do you have difficulty bathing or dressing yourself? no      Do you have difficulty preparing food or eating? no      Do you have difficulty managing your medications? No      Do you have difficulty managing your finances? no      Do you have difficulty affording your medications? no   Social Drivers of Corporate investment banker Strain: Low Risk  (08/11/2023)   Overall Financial Resource Strain (CARDIA)    Difficulty of Paying Living Expenses: Not hard at all  Food Insecurity: No Food Insecurity (08/11/2023)   Hunger Vital Sign    Worried About Running Out of Food in the Last Year: Never true    Ran Out of Food in the Last Year: Never true  Transportation Needs: No Transportation Needs (08/11/2023)   PRAPARE - Administrator, Civil Service (Medical): No    Lack of Transportation (Non-Medical): No  Physical Activity: Insufficiently Active (08/11/2023)   Exercise Vital Sign    Days of Exercise per Week: 5 days    Minutes of Exercise per Session: 20 min  Stress: No Stress Concern Present (08/11/2023)   [employer] of Occupational Health - Occupational Stress Questionnaire    Feeling of Stress : Not at all  Social Connections: Socially Isolated (08/11/2023)   Social Connection and Isolation Panel [NHANES]    Frequency of Communication with Friends and Family: More than three times a week    Frequency of Social Gatherings with Friends and Family: More than three times a week    Attends  Religious Services: Never    Database administrator or Organizations: No    Attends Banker Meetings: Never    Marital Status: Widowed  Intimate Partner Violence: Not At Risk (08/11/2023)   Humiliation, Afraid, Rape, and Kick questionnaire    Fear of Current or Ex-Partner: No    Emotionally Abused: No    Physically Abused: No    Sexually Abused: No     Physical Exam:  Vital signs: BP 120/78   Pulse 70    Constitutional:   Pleasant elderly Caucasian female appears to be in NAD, Well developed, Well nourished, alert and cooperative  Respiratory: Respirations even and unlabored. Lungs clear to auscultation bilaterally.   No wheezes, crackles, or rhonchi.  Cardiovascular: Normal S1, S2. No MRG. Regular rate and  rhythm. No peripheral edema, cyanosis or pallor.  Gastrointestinal:  Soft, nondistended, nontender. No rebound or guarding. Normal bowel sounds. No appreciable masses or hepatomegaly. Msk:  Symmetrical without gross deformities. Without edema, no deformity or joint abnormality. +uses walker to ambulate Psychiatric: Oriented to person, place and time. Demonstrates good judgement and reason without abnormal affect or behaviors.  RELEVANT LABS AND IMAGING: CBC    Component Value Date/Time   WBC 4.5 10/15/2023 0000   WBC 5.8 12/19/2018 1700   RBC 4.07 10/15/2023 0000   HGB 12.3 10/15/2023 0000   HCT 37 10/15/2023 0000   PLT 159 10/15/2023 0000   MCV 96.6 12/19/2018 1700   MCH 29.5 12/19/2018 1700   MCHC 30.6 12/19/2018 1700   RDW 12.4 12/19/2018 1700   LYMPHSABS 1.0 12/19/2018 1700   MONOABS 0.6 12/19/2018 1700   EOSABS 0.2 12/19/2018 1700   BASOSABS 0.0 12/19/2018 1700    CMP     Component Value Date/Time   NA 142 10/15/2023 0000   K 4.3 10/15/2023 0000   CL 106 10/15/2023 0000   CO2 28 (A) 10/15/2023 0000   GLUCOSE 100 (H) 12/19/2018 1700   BUN 16 10/15/2023 0000   CREATININE 0.7 10/15/2023 0000   CREATININE 0.58 12/19/2018 1700   CALCIUM  9.3  10/15/2023 0000   PROT 6.7 12/19/2018 1700   ALBUMIN 3.8 10/15/2023 0000   AST 28 10/15/2023 0000   ALT 19 10/15/2023 0000   ALKPHOS 89 10/15/2023 0000   BILITOT 0.2 (L) 12/19/2018 1700   GFRNONAA >60 12/19/2018 1700   GFRAA >60 12/19/2018 1700    Assessment: 1.  IBS-mixed: Patient has a lot of variance in stool with different things she eats, is very sensitive, has had prior hemicolectomy as well as radiation to her pelvis which likely contribute. Patient today has been doing very well on her current regimen. She has been very diligent about taking her daily fiber supplement and using PRN Miralax for constipation and PRN Imodium for diarrhea. She has had good effects from avoiding decaf coffee and wine. 2.  Rectal bleeding: Seen in previous reports, patient tells me she is no longer having any bleeding it was just 2 times after being constipated, apparently had Hemoccult studies afterwards which are normal as well as labs, given hemorrhoid cream and Pantoprazole , has finished course of Pantoprazole ; most likely hemorrhoids  Plan: - Continue daily Citrucel  - Continue to use Miralax if no BM in 3 days - Continue use Imodium if diarrhea for at least 2 days - Continue food and BM diary - Avoid all types of coffee including decaf and alcohol - RTC 6 months  Regino Caprio, MD [facility] 05/11/2024, 9:38 AM  I spent 36 minutes of time, including in depth chart review, independent review of results as outlined above, communicating results with the patient directly, face-to-face time with the patient, coordinating care, and ordering studies and medications as appropriate, and documentation.

## 2024-05-16 DIAGNOSIS — Z719 Counseling, unspecified: Secondary | ICD-10-CM

## 2024-05-24 ENCOUNTER — Encounter: Payer: Self-pay | Admitting: Internal Medicine

## 2024-05-24 ENCOUNTER — Non-Acute Institutional Stay: Payer: Medicare Other | Admitting: Internal Medicine

## 2024-05-24 VITALS — BP 138/74 | HR 63 | Temp 97.9°F | Ht 64.0 in | Wt 178.0 lb

## 2024-05-24 DIAGNOSIS — K581 Irritable bowel syndrome with constipation: Secondary | ICD-10-CM | POA: Diagnosis not present

## 2024-05-24 DIAGNOSIS — H409 Unspecified glaucoma: Secondary | ICD-10-CM | POA: Diagnosis not present

## 2024-05-24 DIAGNOSIS — E782 Mixed hyperlipidemia: Secondary | ICD-10-CM

## 2024-05-24 DIAGNOSIS — N319 Neuromuscular dysfunction of bladder, unspecified: Secondary | ICD-10-CM

## 2024-05-24 DIAGNOSIS — G4700 Insomnia, unspecified: Secondary | ICD-10-CM | POA: Diagnosis not present

## 2024-05-24 DIAGNOSIS — R2681 Unsteadiness on feet: Secondary | ICD-10-CM

## 2024-05-24 NOTE — Progress Notes (Signed)
 Location:  [facility] features editor of Service:  Clinic (12)  Provider:   Code Status: Full Code Goals of Care:     01/25/2024    2:09 PM  Advanced Directives  Does Patient Have a Medical Advance Directive? Yes  Type of Estate agent of [place];Living will  Does patient want to make changes to medical advance directive? No - Patient declined  Copy of Healthcare Power of Attorney in Chart? Yes - validated most recent copy scanned in chart (See row information)     Chief Complaint  Patient presents with   Medical Management of Chronic Issues    4 month follow up with labs    HPI: Patient is a 89 y.o. female seen today for medical management of chronic diseases.   Lives in [place]   Walks with her walker Power chair for long distance   Had episode this weekend when she started having severe Nausea and vomiting and diarrhea She thinks it is due to some old Prune juice she had She is back to normal now Eating and No diarrhea  No Other issues No Falls Wt Readings from Last 3 Encounters:  05/24/24 178 lb (80.7 kg)  12/29/23 178 lb (80.7 kg)  11/20/23 175 lb (79.4 kg)     Patient has a history of MALT lymphoma PET in 12/2018 was negative for Any recurrence  History of cancer of vaginal vault s/p adjuvant radiation therapy over 20 years ago Uses In and Out cath for urination History of chronic constipation with overflow incontinence  Had a fall in 10/21 and sustained L1 compression fracture.   Follows with Urology for Neuropathic Bladder They recommended Botox which she refused  Continues In And Out Cath   Unstable Gait Now has Scooter and doing well HLD was started on Statin But now she wants to stop it   Past Medical History:  Diagnosis Date   Allergy    Anxiety    Arthritis    Bursitis    right leg   Family history of kidney cancer    Family history of lung cancer    Family history of uterine cancer    GERD  (gastroesophageal reflux disease)    History of blood transfusion    History of uterine cancer 01/19/2019   Hyperlipidemia    Hypertension    Insomnia    Non Hodgkin's lymphoma (HCC) 2017-2019   cured   Osteopenia    MILD   Urine retention    Uterine cancer (HCC)    at age 79    Past Surgical History:  Procedure Laterality Date   ABDOMINAL HYSTERECTOMY     AGE 35   APPENDECTOMY     AGE 35   BLADDER SURGERY     Catarct Surgery     ECTOPIC PREGNANCY SURGERY     age 27   LAPAROSCOPIC SMALL BOWEL RESECTION     NM PET DX LYMPHOMA  10/2018   IN REMISSION   occular pressure     OTHER SURGICAL HISTORY     REPLACEMENT TOTAL KNEE BILATERAL     TONSILLECTOMY AND ADENOIDECTOMY     age 89 or 89   UTERINE CANCER SURGERY     age 89- located in vagina    Allergies  Allergen Reactions   Cefuroxime    Clindamycin/Lincomycin    Levofloxacin    Lisinopril    Prochlorperazine Edisylate    Psyllium    Sulfa Antibiotics  Outpatient Encounter Medications as of 05/24/2024  Medication Sig   acetaminophen  (TYLENOL ) 500 MG tablet Take 500 mg by mouth every 6 (six) hours as needed.   bimatoprost  (LUMIGAN ) 0.01 % SOLN Place 1 drop into both eyes at bedtime.   brimonidine  (ALPHAGAN ) 0.15 % ophthalmic solution 1 drop 3 (three) times daily.   eszopiclone  (LUNESTA ) 2 MG TABS tablet Take 1 tablet (2 mg total) by mouth at bedtime. Take immediately before bedtime   loperamide (IMODIUM) 1 MG/5ML solution Take 2 mg by mouth as needed for diarrhea or loose stools.   Methylcellulose, Laxative, (CITRUCEL PO) Take 1 Dose by mouth daily.   nitrofurantoin  (MACRODANTIN ) 50 MG capsule Take 1 capsule (50 mg total) by mouth daily.   ondansetron  (ZOFRAN ) 4 MG tablet Take 4 mg by mouth every 8 (eight) hours as needed.   polyethylene glycol (MIRALAX / GLYCOLAX) 17 g packet Take 17 g by mouth as needed.   cycloSPORINE  (RESTASIS ) 0.05 % ophthalmic emulsion Place 2 drops into both eyes 2 (two) times daily.  (Patient not taking: Reported on 05/19/2024)   Polyethyl Glycol-Propyl Glycol (SYSTANE) 0.4-0.3 % SOLN Apply 1 drop to eye in the morning, at noon, and at bedtime. (Patient not taking: Reported on 05/19/2024)   No facility-administered encounter medications on file as of 05/24/2024.    Review of Systems:  Review of Systems  Constitutional:  Negative for activity change and appetite change.  HENT: Negative.    Respiratory:  Negative for cough and shortness of breath.   Cardiovascular:  Negative for leg swelling.  Gastrointestinal:  Negative for constipation.  Genitourinary: Negative.   Musculoskeletal:  Positive for gait problem. Negative for arthralgias and myalgias.  Skin: Negative.   Neurological:  Negative for dizziness and weakness.  Psychiatric/Behavioral:  Negative for confusion, dysphoric mood and sleep disturbance.     Health Maintenance  Topic Date Due   COVID-19 Vaccine (7 - 2024-25 season) 09/08/2024 (Originally 08/16/2023)   INFLUENZA VACCINE  07/15/2024   Medicare Annual Wellness (AWV)  08/10/2024   DTaP/Tdap/Td (3 - Td or Tdap) 05/21/2030   Pneumonia Vaccine 77+ Years old  Completed   DEXA SCAN  Completed   Zoster Vaccines- Shingrix  Completed   HPV VACCINES  Aged Out   Meningococcal B Vaccine  Aged Out    Physical Exam: Vitals:   05/24/24 1333  BP: 138/74  Pulse: 63  Temp: 97.9 F (36.6 C)  SpO2: 97%  Weight: 178 lb (80.7 kg)  Height: 5\' 4"  (1.626 m)   Body mass index is 30.55 kg/m. Physical Exam Vitals reviewed.  Constitutional:      Appearance: Normal appearance.  HENT:     Head: Normocephalic.     Nose: Nose normal.     Mouth/Throat:     Mouth: Mucous membranes are moist.     Pharynx: Oropharynx is clear.  Eyes:     Pupils: Pupils are equal, round, and reactive to light.  Cardiovascular:     Rate and Rhythm: Normal rate and regular rhythm.     Pulses: Normal pulses.     Heart sounds: Normal heart sounds. No murmur heard. Pulmonary:     Effort:  Pulmonary effort is normal.     Breath sounds: Normal breath sounds.  Abdominal:     General: Abdomen is flat. Bowel sounds are normal.     Palpations: Abdomen is soft.  Musculoskeletal:        General: No swelling.     Cervical back: Neck supple.  Comments: Mild edema Due to Chronic Venous changes  Skin:    General: Skin is warm.  Neurological:     General: No focal deficit present.     Mental Status: She is alert and oriented to person, place, and time.  Psychiatric:        Mood and Affect: Mood normal.        Thought Content: Thought content normal.     Labs reviewed: Basic Metabolic Panel: Recent Labs    10/15/23 0000  NA 142  K 4.3  CL 106  CO2 28*  BUN 16  CREATININE 0.7  CALCIUM  9.3   Liver Function Tests: Recent Labs    10/15/23 0000  AST 28  ALT 19  ALKPHOS 89  ALBUMIN 3.8   No results for input(s): "LIPASE", "AMYLASE" in the last 8760 hours. No results for input(s): "AMMONIA" in the last 8760 hours. CBC: Recent Labs    10/15/23 0000  WBC 4.5  HGB 12.3  HCT 37  PLT 159   Lipid Panel: Recent Labs    01/22/24 0000  CHOL 196  HDL 44  LDLCALC 129  TRIG 811   No results found for: "HGBA1C"  Procedures since last visit: No results found.  Assessment/Plan 1. Insomnia, unspecified type (Primary) Doing well on Lunesta  GDR failed  2. Mixed hyperlipidemia Off statin by her choice Repeat Lipid Panel  3. Glaucoma, unspecified glaucoma type, unspecified laterality Follows with Dr Ambrosio Junker  4. Neurogenic bladder Uses In And out cath  5. Unstable gait Doing well with her walker and Power scooter  6. Irritable bowel syndrome with constipation Follows with GI on Citrucel  7 Bilateral Edema Continue Socks and Elevation  Labs/tests ordered:  CBC,CMP,Lipid and Vit d level ordered for this Tues Next appt:  Visit date not found

## 2024-05-31 DIAGNOSIS — E559 Vitamin D deficiency, unspecified: Secondary | ICD-10-CM | POA: Diagnosis not present

## 2024-05-31 DIAGNOSIS — E7800 Pure hypercholesterolemia, unspecified: Secondary | ICD-10-CM | POA: Diagnosis not present

## 2024-05-31 DIAGNOSIS — E870 Hyperosmolality and hypernatremia: Secondary | ICD-10-CM | POA: Diagnosis not present

## 2024-05-31 LAB — CBC: RBC: 4.35 (ref 3.87–5.11)

## 2024-05-31 LAB — LIPID PANEL
Cholesterol: 242 — AB (ref 0–200)
HDL: 51 (ref 35–70)
LDL Cholesterol: 163
LDl/HDL Ratio: 4.8
Triglycerides: 139 (ref 40–160)

## 2024-05-31 LAB — HEPATIC FUNCTION PANEL
ALT: 20 U/L (ref 7–35)
AST: 29 (ref 13–35)
Alkaline Phosphatase: 101 (ref 25–125)
Bilirubin, Total: 0.3

## 2024-05-31 LAB — CBC AND DIFFERENTIAL
HCT: 39 (ref 36–46)
Hemoglobin: 12.9 (ref 12.0–16.0)
Platelets: 178 K/uL (ref 150–400)
WBC: 5

## 2024-05-31 LAB — BASIC METABOLIC PANEL WITH GFR
BUN: 16 (ref 4–21)
CO2: 23 — AB (ref 13–22)
Chloride: 103 (ref 99–108)
Creatinine: 0.7 (ref 0.5–1.1)
Glucose: 86
Potassium: 4.2 meq/L (ref 3.5–5.1)
Sodium: 139 (ref 137–147)

## 2024-05-31 LAB — COMPREHENSIVE METABOLIC PANEL WITH GFR
Albumin: 4 (ref 3.5–5.0)
Calcium: 9.4 (ref 8.7–10.7)
Globulin: 2.6
eGFR: 81

## 2024-05-31 LAB — TSH: TSH: 2.98 (ref 0.41–5.90)

## 2024-05-31 LAB — VITAMIN D 25 HYDROXY (VIT D DEFICIENCY, FRACTURES): Vit D, 25-Hydroxy: 27.5

## 2024-06-03 ENCOUNTER — Telehealth: Payer: Self-pay

## 2024-06-03 NOTE — Telephone Encounter (Signed)
 Copied from CRM [id number]. Topic: Clinical - Lab/Test Results >> Jun 03, 2024  9:37 AM Shelby Dessert H wrote: Reason for CRM: Patient is calling because she is wanting the results for her labs that were drawn on Tuesday. She is also wanted a printed copy as well. Patients callback number is [phone].  Patient call this afternoon in regards to lab results, patient stating that she has not received her results from the lab.   Message sent to Marguerite Shiley, MD

## 2024-06-06 NOTE — Telephone Encounter (Signed)
 AL nurse gave her labs to her

## 2024-06-15 DIAGNOSIS — D692 Other nonthrombocytopenic purpura: Secondary | ICD-10-CM | POA: Diagnosis not present

## 2024-06-15 DIAGNOSIS — L720 Epidermal cyst: Secondary | ICD-10-CM | POA: Diagnosis not present

## 2024-06-15 DIAGNOSIS — Z85828 Personal history of other malignant neoplasm of skin: Secondary | ICD-10-CM | POA: Diagnosis not present

## 2024-06-15 DIAGNOSIS — D2239 Melanocytic nevi of other parts of face: Secondary | ICD-10-CM | POA: Diagnosis not present

## 2024-06-15 DIAGNOSIS — L821 Other seborrheic keratosis: Secondary | ICD-10-CM | POA: Diagnosis not present

## 2024-06-30 DIAGNOSIS — N3000 Acute cystitis without hematuria: Secondary | ICD-10-CM | POA: Diagnosis not present

## 2024-06-30 DIAGNOSIS — N312 Flaccid neuropathic bladder, not elsewhere classified: Secondary | ICD-10-CM | POA: Diagnosis not present

## 2024-07-05 ENCOUNTER — Other Ambulatory Visit: Payer: Self-pay | Admitting: Internal Medicine

## 2024-07-05 DIAGNOSIS — G4700 Insomnia, unspecified: Secondary | ICD-10-CM

## 2024-07-05 MED ORDER — ESZOPICLONE 2 MG PO TABS: 2.0000 mg | ORAL_TABLET | Freq: Every day | ORAL | 5 refills | Status: DC

## 2024-07-11 DIAGNOSIS — N3000 Acute cystitis without hematuria: Secondary | ICD-10-CM | POA: Diagnosis not present

## 2024-07-12 DIAGNOSIS — H04123 Dry eye syndrome of bilateral lacrimal glands: Secondary | ICD-10-CM | POA: Diagnosis not present

## 2024-07-12 DIAGNOSIS — H401131 Primary open-angle glaucoma, bilateral, mild stage: Secondary | ICD-10-CM | POA: Diagnosis not present

## 2024-07-14 ENCOUNTER — Non-Acute Institutional Stay: Payer: Self-pay | Admitting: Adult Health

## 2024-07-14 ENCOUNTER — Encounter: Payer: Self-pay | Admitting: Adult Health

## 2024-07-14 DIAGNOSIS — H40059 Ocular hypertension, unspecified eye: Secondary | ICD-10-CM

## 2024-07-14 DIAGNOSIS — R8271 Bacteriuria: Secondary | ICD-10-CM | POA: Diagnosis not present

## 2024-07-14 DIAGNOSIS — I10 Essential (primary) hypertension: Secondary | ICD-10-CM

## 2024-07-14 NOTE — Progress Notes (Signed)
 Location:  Medical illustrator of Service:  ALF (13) Provider:   Bari America, ANP [facility] [phone]   Charlanne Fredia CROME, MD  Patient Care Team: Charlanne Fredia CROME, MD as PCP - General (Internal Medicine) Cary Doffing, MD as Consulting Physician (Dermatology) Barbarann Oneil BROCKS, MD (Inactive) as Consulting Physician (Orthopedic Surgery) Waylan Cain, MD as Consulting Physician (Ophthalmology) Teressa Toribio SQUIBB, MD (Inactive) as Attending Physician (Gastroenterology) Magdalen Pasco RAMAN, DPM as Consulting Physician (Podiatry) Carolee Sherwood JONETTA DOUGLAS, MD as Consulting Physician (Urology) Arlana Arnt, MD as Consulting Physician (Otolaryngology)  Extended Emergency Contact Information Primary Emergency Contact: [facility] Address: [address]          [place], [place] [address] [place] Mobile Phone: [phone] Relation: Son Secondary Emergency Contact: Bea Toribio PARAS Address: [address]          [place], [place] [address] [place] Mobile Phone: [phone] Relation: Son   Goals of care: Advanced Directive information    01/25/2024    2:09 PM  Advanced Directives  Does Patient Have a Medical Advance Directive? Yes  Type of Estate agent of [place];Living will  Does patient want to make changes to medical advance directive? No - Patient declined  Copy of Healthcare Power of Attorney in Chart? Yes - validated most recent copy scanned in chart (See row information)     Chief Complaint  Patient presents with   Acute Visit    Discuss urine culture.     HPI:  History of Present Illness The patient, with a history of flaccid bladder, presents with concerns regarding a urine culture and urinary tract infection management.  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 requiring intermittent self-catheterization six to eight times  daily - Routine urine specimen collected on June 30, 2024, during a routine check without urinary symptoms at the urologist  - Initial urine culture revealed a urinary tract infection; treated with a higher dose of macrodantin , then returned to 50 mg prophylactic dose after completion - Subsequent urine culture showed >100,000 colonies of Klebsiella with an MIC of 32 for nitrofurantoin  - Has not started the recommended antibiotics for the most recent culture - No urinary tract symptoms - Questions the accuracy of the urine specimen, suspecting contamination during self-collection  Blood pressure elevation - Blood pressure has been slightly elevated - Last recorded blood pressure was 140/74 mmHg - Systolic blood pressure readings have been over 150 mmHg  Ocular findings - Informed of elevated intraocular pressure - No visual disturbances  Results Urine culture: >100,000 CFU/mL of Klebsiella, MIC of 32 g/mL with nitrofurantoin  (06/30/2024)     Past Medical History:  Diagnosis Date   Allergy    Anxiety    Arthritis    Bursitis    right leg   Family history of kidney cancer    Family history of lung cancer    Family history of uterine cancer    GERD (gastroesophageal reflux disease)    History of blood transfusion    History of uterine cancer 01/19/2019   Hyperlipidemia    Hypertension    Insomnia    Non Hodgkin's lymphoma (HCC) 2017-2019   cured   Osteopenia    MILD   Urine retention    Uterine cancer (HCC)    at age 41   Past Surgical History:  Procedure Laterality Date   ABDOMINAL HYSTERECTOMY  AGE 61   APPENDECTOMY     AGE 71   BLADDER SURGERY     Catarct Surgery     ECTOPIC PREGNANCY SURGERY     age 30   LAPAROSCOPIC SMALL BOWEL RESECTION     NM PET DX LYMPHOMA  10/2018   IN REMISSION   occular pressure     OTHER SURGICAL HISTORY     REPLACEMENT TOTAL KNEE BILATERAL     TONSILLECTOMY AND ADENOIDECTOMY     age 73 or 82   UTERINE CANCER SURGERY     age  36- located in vagina    Allergies  Allergen Reactions   Cefuroxime    Clindamycin/Lincomycin    Levofloxacin    Lisinopril    Prochlorperazine Edisylate    Psyllium    Sulfa Antibiotics     Outpatient Encounter Medications as of 07/14/2024  Medication Sig   acetaminophen  (TYLENOL ) 500 MG tablet Take 500 mg by mouth every 6 (six) hours as needed.   bimatoprost  (LUMIGAN ) 0.01 % SOLN Place 1 drop into both eyes at bedtime.   brimonidine  (ALPHAGAN ) 0.15 % ophthalmic solution 1 drop 3 (three) times daily.   cycloSPORINE  (RESTASIS ) 0.05 % ophthalmic emulsion Place 2 drops into both eyes 2 (two) times daily. (Patient not taking: Reported on 05/19/2024)   eszopiclone  (LUNESTA ) 2 MG TABS tablet Take 1 tablet (2 mg total) by mouth at bedtime. Take immediately before bedtime   loperamide (IMODIUM) 1 MG/5ML solution Take 2 mg by mouth as needed for diarrhea or loose stools.   Methylcellulose, Laxative, (CITRUCEL PO) Take 1 Dose by mouth daily.   nitrofurantoin  (MACRODANTIN ) 50 MG capsule Take 1 capsule (50 mg total) by mouth daily.   ondansetron  (ZOFRAN ) 4 MG tablet Take 4 mg by mouth every 8 (eight) hours as needed.   Polyethyl Glycol-Propyl Glycol (SYSTANE) 0.4-0.3 % SOLN Apply 1 drop to eye in the morning, at noon, and at bedtime. (Patient not taking: Reported on 05/19/2024)   polyethylene glycol (MIRALAX / GLYCOLAX) 17 g packet Take 17 g by mouth as needed.   No facility-administered encounter medications on file as of 07/14/2024.    Review of Systems  Constitutional:  Negative for activity change, appetite change, chills, diaphoresis, fatigue, fever and unexpected weight change.  HENT:  Negative for congestion.   Respiratory:  Negative for cough, shortness of breath and wheezing.   Cardiovascular:  Negative for chest pain, palpitations and leg swelling.  Gastrointestinal:  Negative for abdominal distention, abdominal pain, constipation and diarrhea.  Genitourinary:  Negative for difficulty  urinating, dysuria, flank pain, frequency, hematuria, pelvic pain and urgency.  Musculoskeletal:  Positive for gait problem. Negative for arthralgias, back pain, joint swelling and myalgias.  Neurological:  Negative for dizziness, tremors, seizures, syncope, facial asymmetry, speech difficulty, weakness, light-headedness, numbness and headaches.  Psychiatric/Behavioral:  Negative for agitation, behavioral problems and confusion.     Immunization History  Administered Date(s) Administered   Fluad Quad(high Dose 65+) 09/19/2022   Fluzone  Influenza virus vaccine,trivalent (IIV3), split virus 08/29/2009, 09/02/2010, 08/30/2012   Influenza, High Dose Seasonal PF 09/26/2014, 08/17/2015, 08/19/2016, 08/20/2017, 08/24/2018, 09/23/2019, 10/05/2020   Influenza-Unspecified 08/20/2017, 10/05/2020, 09/18/2021   Moderna SARS-COV2 Booster Vaccination 05/19/2021, 03/06/2023   Moderna Sars-Covid-2 Vaccination 12/27/2019, 01/24/2020, 10/25/2020, 09/25/2021, 04/30/2022, 11/05/2022   Pneumococcal Conjugate-13 09/07/2014   Pneumococcal Polysaccharide-23 09/21/2007   Tdap 05/23/2010, 05/21/2020   Zoster Recombinant(Shingrix) 09/22/2017, 11/25/2017   Zoster, Live 10/24/2005   Pertinent  Health Maintenance Due  Topic Date Due   INFLUENZA  VACCINE  07/15/2024   DEXA SCAN  Completed      07/20/2023    1:07 PM 08/11/2023   11:34 AM 10/20/2023    1:05 PM 12/30/2023   12:47 PM 01/25/2024    2:09 PM  Fall Risk  Falls in the past year? 0 0 0 0 0  Was there an injury with Fall? 0 0 0 0 0  Fall Risk Category Calculator 0 0 0 0 0  Patient at Risk for Falls Due to  Impaired balance/gait  History of fall(s);Impaired balance/gait No Fall Risks  Fall risk Follow up Falls evaluation completed Falls evaluation completed;Education provided Falls evaluation completed Falls evaluation completed;Education provided Falls evaluation completed   Functional Status Survey:    Vitals:   07/14/24 1626  BP: (!) 140/74  Pulse: (!)  56  Resp: 18  Temp: 97.9 F (36.6 C)   There is no height or weight on file to calculate BMI. Physical Exam Vitals and nursing note reviewed.  Constitutional:      Appearance: Normal appearance.  Neurological:     Mental Status: She is alert and oriented to person, place, and time.  Psychiatric:        Mood and Affect: Mood normal.     Labs reviewed: Recent Labs    10/15/23 0000  NA 142  K 4.3  CL 106  CO2 28*  BUN 16  CREATININE 0.7  CALCIUM  9.3   Recent Labs    10/15/23 0000  AST 28  ALT 19  ALKPHOS 89  ALBUMIN 3.8   Recent Labs    10/15/23 0000  WBC 4.5  HGB 12.3  HCT 37  PLT 159   Lab Results  Component Value Date   TSH 3.54 04/03/2022   No results found for: HGBA1C Lab Results  Component Value Date   CHOL 196 01/22/2024   HDL 44 01/22/2024   LDLCALC 129 01/22/2024   TRIG 114 01/22/2024   CHOLHDL 3 08/24/2018    Significant Diagnostic Results in last 30 days:  No results found.  Assessment/Plan Assessment and Plan Asymptomatic bacteriuria (Klebsiella species) in patient with flaccid neuropathic bladder Asymptomatic bacteriuria detected with >100,000 colonies of Klebsiella species. No urinary symptoms. Concern about contamination during self-collection. - Recollect urine specimen to rule out contamination. - Hold Augmentin and trimethoprim until recollection results.  Elevated blood pressure Slightly elevated blood pressure with recent readings of 140/74 and systolic over 150. Anxiety may contribute to elevation. - Schedule follow-up next week to monitor blood pressure.  Elevated intraocular pressure Increased intraocular pressure noted by ophthalmologist. No visual complaints. Concern for contribution from higher dose of macrodantin   - Follow up with ophthalmologist after discontinuing higher dose of macrodantin . - Repeat eye exam to assess intraocular pressure.  Total time :  time greater than 50% of total time spent doing pt  counseling and coordination of care

## 2024-07-15 DIAGNOSIS — R339 Retention of urine, unspecified: Secondary | ICD-10-CM | POA: Diagnosis not present

## 2024-07-19 ENCOUNTER — Encounter: Payer: Self-pay | Admitting: Internal Medicine

## 2024-07-19 ENCOUNTER — Ambulatory Visit: Admitting: Internal Medicine

## 2024-07-19 VITALS — BP 144/60 | HR 63 | Temp 97.7°F | Ht 64.0 in | Wt 178.0 lb

## 2024-07-19 DIAGNOSIS — I10 Essential (primary) hypertension: Secondary | ICD-10-CM

## 2024-07-19 DIAGNOSIS — R8271 Bacteriuria: Secondary | ICD-10-CM | POA: Diagnosis not present

## 2024-07-19 DIAGNOSIS — H40059 Ocular hypertension, unspecified eye: Secondary | ICD-10-CM | POA: Diagnosis not present

## 2024-07-20 NOTE — Progress Notes (Signed)
 Location:  [facility]   Place of Service:   Clinic  Provider:   Code Status:  Goals of Care:     01/25/2024    2:09 PM  Advanced Directives  Does Patient Have a Medical Advance Directive? Yes  Type of Estate agent of [place];Living will  Does patient want to make changes to medical advance directive? No - Patient declined  Copy of Healthcare Power of Attorney in Chart? Yes - validated most recent copy scanned in chart (See row information)     Chief Complaint  Patient presents with   Medical Management of Chronic Issues    BP check. Patient wants to discuss new medications that she was started on by other providers.    HPI: Patient is a 89 y.o. female seen today for an acute visit for Her Positive Urine Culture  Patient [place] [facility] She has a history of neuropathic bladder and uses In-N-Out cath.  She went to see a urologist for her routine follow-up.  They did a urine sample which showed TNTC WBC and the culture grew Klebsiella more than 100 K colonies.  She was initially told to increase the Macrodantin  which she takes for prophylaxis to 3 times a day.  But then was called back and told her that her bacteria was resistant to it and she needs to start taking Augmentin and change her Macrodantin  to trimethoprim as prophylaxis. All of these changes made patient very upset.  She went to see her ophthalmologist and they noticed that her eye pressure was really high.  The nurses AL also started taking her blood pressure which was high.   Patient is very upset.  She still denies any urinary issues no dysuria no abdominal pain.  She has plan to see Dr. Waylan ophthalmology next week Bari had ordered a new UA and culture which is again positive for Klebsiella more than 100 K colonies        Past Medical History:  Diagnosis Date   Allergy    Anxiety    Arthritis    Bursitis    right leg   Family history of kidney cancer    Family history of  lung cancer    Family history of uterine cancer    GERD (gastroesophageal reflux disease)    History of blood transfusion    History of uterine cancer 01/19/2019   Hyperlipidemia    Hypertension    Insomnia    Non Hodgkin's lymphoma (HCC) 2017-2019   cured   Osteopenia    MILD   Urine retention    Uterine cancer (HCC)    at age 72    Past Surgical History:  Procedure Laterality Date   ABDOMINAL HYSTERECTOMY     AGE 39   APPENDECTOMY     AGE 53   BLADDER SURGERY     Catarct Surgery     ECTOPIC PREGNANCY SURGERY     age 87   LAPAROSCOPIC SMALL BOWEL RESECTION     NM PET DX LYMPHOMA  10/2018   IN REMISSION   occular pressure     OTHER SURGICAL HISTORY     REPLACEMENT TOTAL KNEE BILATERAL     TONSILLECTOMY AND ADENOIDECTOMY     age 41 or 76   UTERINE CANCER SURGERY     age 6- located in vagina    Allergies  Allergen Reactions   Cefuroxime    Clindamycin/Lincomycin    Levofloxacin    Lisinopril  Prochlorperazine Edisylate    Psyllium    Sulfa Antibiotics     Outpatient Encounter Medications as of 07/19/2024  Medication Sig   acetaminophen  (TYLENOL ) 500 MG tablet Take 500 mg by mouth every 6 (six) hours as needed.   acetaminophen  (TYLENOL ) 500 MG tablet Take 500 mg by mouth 2 (two) times daily.   bimatoprost  (LUMIGAN ) 0.01 % SOLN Place 1 drop into both eyes at bedtime.   brimonidine  (ALPHAGAN ) 0.15 % ophthalmic solution 1 drop 3 (three) times daily.   cetirizine (ZYRTEC) 10 MG tablet Take 10 mg by mouth daily.   cycloSPORINE  (RESTASIS ) 0.05 % ophthalmic emulsion Place 2 drops into both eyes 2 (two) times daily.   eszopiclone  (LUNESTA ) 2 MG TABS tablet Take 1 tablet (2 mg total) by mouth at bedtime. Take immediately before bedtime   loperamide (IMODIUM) 1 MG/5ML solution Take 2 mg by mouth as needed for diarrhea or loose stools.   Methylcellulose, Laxative, (CITRUCEL PO) Take 1 Dose by mouth daily.   nitrofurantoin  (MACRODANTIN ) 50 MG capsule Take 1 capsule (50 mg  total) by mouth daily.   olopatadine (PATADAY) 0.1 % ophthalmic solution Place 1 drop into both eyes daily.   ondansetron  (ZOFRAN ) 4 MG tablet Take 4 mg by mouth every 8 (eight) hours as needed. (Patient taking differently: Take 4 mg by mouth every 6 (six) hours as needed for nausea.)   polyethylene glycol (MIRALAX / GLYCOLAX) 17 g packet Take 17 g by mouth as needed.   Polyethyl Glycol-Propyl Glycol (SYSTANE) 0.4-0.3 % SOLN Apply 1 drop to eye in the morning, at noon, and at bedtime. (Patient not taking: Reported on 07/19/2024)   No facility-administered encounter medications on file as of 07/19/2024.    Review of Systems:  Review of Systems  Constitutional:  Negative for activity change and appetite change.  HENT: Negative.    Respiratory:  Negative for cough and shortness of breath.   Cardiovascular:  Negative for leg swelling.  Gastrointestinal:  Negative for constipation.  Genitourinary: Negative.   Musculoskeletal:  Positive for gait problem. Negative for arthralgias and myalgias.  Skin: Negative.   Neurological:  Negative for dizziness and weakness.  Psychiatric/Behavioral:  Negative for confusion, dysphoric mood and sleep disturbance.     Health Maintenance  Topic Date Due   INFLUENZA VACCINE  07/15/2024   Medicare Annual Wellness (AWV)  08/10/2024   COVID-19 Vaccine (7 - 2024-25 season) 09/08/2024 (Originally 08/16/2023)   DTaP/Tdap/Td (3 - Td or Tdap) 05/21/2030   Pneumococcal Vaccine: 50+ Years  Completed   DEXA SCAN  Completed   Zoster Vaccines- Shingrix  Completed   Hepatitis B Vaccines  Aged Out   HPV VACCINES  Aged Out   Meningococcal B Vaccine  Aged Out    Physical Exam: Vitals:   07/19/24 1336 07/19/24 1343  BP: (!) 160/78 (!) 144/60  Pulse: 63   Temp: 97.7 F (36.5 C)   SpO2: 95%   Weight: 178 lb (80.7 kg)   Height: 5' 4 (1.626 m)    Body mass index is 30.55 kg/m. Physical Exam Vitals reviewed.  Constitutional:      Appearance: Normal appearance.   HENT:     Head: Normocephalic.     Nose: Nose normal.     Mouth/Throat:     Mouth: Mucous membranes are moist.     Pharynx: Oropharynx is clear.  Eyes:     Pupils: Pupils are equal, round, and reactive to light.  Cardiovascular:     Rate and Rhythm:  Normal rate and regular rhythm.     Pulses: Normal pulses.     Heart sounds: Normal heart sounds. No murmur heard. Pulmonary:     Effort: Pulmonary effort is normal.     Breath sounds: Normal breath sounds.  Abdominal:     General: Abdomen is flat. Bowel sounds are normal.     Palpations: Abdomen is soft.  Musculoskeletal:        General: No swelling.     Cervical back: Neck supple.  Skin:    General: Skin is warm.  Neurological:     General: No focal deficit present.     Mental Status: She is alert and oriented to person, place, and time.  Psychiatric:        Mood and Affect: Mood normal.        Thought Content: Thought content normal.     Labs reviewed: Basic Metabolic Panel: Recent Labs    10/15/23 0000 05/31/24 0000  NA 142 139  K 4.3 4.2  CL 106 103  CO2 28* 23*  BUN 16 16  CREATININE 0.7 0.7  CALCIUM  9.3 9.4  TSH  --  2.98   Liver Function Tests: Recent Labs    10/15/23 0000 05/31/24 0000  AST 28 29  ALT 19 20  ALKPHOS 89 101  ALBUMIN 3.8 4.0   No results for input(s): LIPASE, AMYLASE in the last 8760 hours. No results for input(s): AMMONIA in the last 8760 hours. CBC: Recent Labs    10/15/23 0000 05/31/24 0000  WBC 4.5 5.0  HGB 12.3 12.9  HCT 37 39  PLT 159 178   Lipid Panel: Recent Labs    01/22/24 0000 05/31/24 0000  CHOL 196 242*  HDL 44 51  LDLCALC 129 163  TRIG 114 139   No results found for: HGBA1C  Procedures since last visit: No results found.  Assessment/Plan 1. Asymptomatic bacteriuria (Primary) Patient continues to have no symptoms of dysuria, fever or frequency or any other symptoms to make me think that she has UTI.  This seems more like a asymptomatic  bacteriuria Then hold the antibiotics for now.  I have discontinued her Augmentin.  I have told her if she gets any symptoms to let us  know right away.  We will start her on Augmentin   2. Hypertension, essential, benign Her blood pressure recently was also high.  Patient says that she is upset that the nurses are checking it every day.  I have told them to check it only once a week and we will continue to monitor  3. Raised intraocular pressure, unspecified laterality Could be because of her stress and anxiety. I am not changing any of her meds right now She will follow-up with Dr. Waylan in 1 week  And then she will follow-up with me in few weeks to see if we can change her Macrodantin  to trimethoprim as a prophylaxis  Labs/tests ordered:  * No order type specified * Next appt:  08/30/2024

## 2024-07-29 DIAGNOSIS — H401132 Primary open-angle glaucoma, bilateral, moderate stage: Secondary | ICD-10-CM | POA: Diagnosis not present

## 2024-08-22 ENCOUNTER — Ambulatory Visit: Payer: Self-pay

## 2024-08-22 NOTE — Telephone Encounter (Signed)
 FYI Only or Action Required?: FYI only for provider.  Patient was last seen in primary care on 07/19/2024 by Linda Fredia CROME, MD.  Called Nurse Triage reporting Rash.  Symptoms began x 8 days ago.  Interventions attempted: Prescription medications: Cream prescribed by PCP.  Symptoms are: unchanged.  Triage Disposition: See PCP When Office is Open (Within 3 Days)  Patient/caregiver understands and will follow disposition?: Yes  **Pt. Was advised by specialist Dr. Charlanne will be at patients assisted living facility tomorrow 9/9. Pt. Stated she doesn't know how to get on the list to be seen; but will contact staff for specifics, pt. Is scheduled for 9/10 incase she is unable to see Dr. Charlanne tomorrow 9/9**         Copied from CRM [id number]. Topic: Clinical - Red Word Triage >> Aug 22, 2024  1:03 PM Linda [name] wrote: Red Word that prompted transfer to Nurse Triage: Patient resides at [facility]. She states a week ago, she saw Dr. Charlanne about a rash on her chest and was given a cream for it. Patient states over the weekend, her chest broke out along with her cheeks and neck being bright red. Patient states something is wrong. She states the nurse on call told her that they couldn't do anything over the weekend. Patient wants to see Dr. Charlanne or know what does she need to do. Reason for Disposition  Localized rash present > 7 days  Answer Assessment - Initial Assessment Questions 1. APPEARANCE of RASH: What does the rash look like? (e.g., blisters, dry flaky skin, red spots, redness, sores)      Chest area rash has red bright red circles   2. LOCATION: Where is the rash located?      Neck area, upper chest area  3. NUMBER: How many spots are there?      Several   4. SIZE: How big are the spots? (e.g., inches, cm; or compare to size of pinhead, tip of pen, eraser, pea)      Very small  5. ONSET: When did the rash start?      X 8 days   6. ITCHING: Does  the rash itch? If Yes, ask: How bad is the itch?  (Scale 0-10; or none, mild, moderate, severe)     No   7. PAIN: Does the rash hurt? If Yes, ask: How bad is the pain?  (Scale 0-10; or none, mild, moderate, severe)     No   8. OTHER SYMPTOMS: Do you have any other symptoms? (e.g., fever) No   9/2 cream was prescribed, symptoms persistent  Protocols used: Rash or Redness - Localized-A-AH

## 2024-08-22 NOTE — Telephone Encounter (Signed)
 Patient has an appointment on 08/24/2024 with Dr. Sherlynn.   Patient is a [facility] patient. Dr. Charlanne has appointments available at [facility] for tomorrow. Called patient and scheduled for 08/23/2024 at [facility].

## 2024-08-23 ENCOUNTER — Other Ambulatory Visit: Payer: Self-pay | Admitting: Orthopedic Surgery

## 2024-08-23 ENCOUNTER — Non-Acute Institutional Stay: Admitting: Internal Medicine

## 2024-08-23 ENCOUNTER — Encounter: Payer: Self-pay | Admitting: Internal Medicine

## 2024-08-23 VITALS — BP 104/60 | HR 64 | Temp 97.5°F | Ht 64.0 in | Wt 177.0 lb

## 2024-08-23 DIAGNOSIS — H40059 Ocular hypertension, unspecified eye: Secondary | ICD-10-CM

## 2024-08-23 DIAGNOSIS — L239 Allergic contact dermatitis, unspecified cause: Secondary | ICD-10-CM | POA: Diagnosis not present

## 2024-08-23 MED ORDER — COVID-19 MRNA VACCINE (PFIZER) 30 MCG/0.3ML IM SUSP: 0.3000 mL | Freq: Once | INTRAMUSCULAR | 0 refills | Status: AC

## 2024-08-23 NOTE — Patient Instructions (Addendum)
 1.)Please visit your local pharmacy to receive your flu and covid vaccines.  Keep up with triamcinolone  for 2 weeks or till rash resolved Call us  if rash is worse. Will write for nurses to do double wash on Sheets and Towels.

## 2024-08-24 ENCOUNTER — Ambulatory Visit: Admitting: Sports Medicine

## 2024-08-25 NOTE — Progress Notes (Signed)
 Location: [facility] [employer] features editor of Service:  Clinic (12)  Provider:   Code Status:  Goals of Care:     01/25/2024    2:09 PM  Advanced Directives  Does Patient Have a Medical Advance Directive? Yes  Type of Estate agent of [place];Living will  Does patient want to make changes to medical advance directive? No - Patient declined  Copy of Healthcare Power of Attorney in Chart? Yes - validated most recent copy scanned in chart (See row information)     Chief Complaint  Patient presents with   Rash    Rash on chest and cheeks for about 10 days. Flu & covid vaccine postponed( patient plans to get at her pharmacy     HPI: Patient is a 89 y.o. female seen today for an acute visit for Rash in her Neck area and Face area  Discussed the use of AI scribe software for clinical note transcription with the patient, who gave verbal consent to proceed.  History of Present Illness   Linda [name] is a 89 year old female who presents with a bright red rash on her cheeks and face.  Twelve days ago, she developed a bright red rash on her cheeks and Neck . The rash is non-itchy, not warm, but the skin feels rough and crumbles. She has not used new creams, jewelry, or detergents, and her laundry routine remains unchanged. A Triamcinolone  cream used twice daily has minimally reduced redness without improving the condition.  She has a history of allergies but has not experienced this reaction before. Four years ago, she had a sun exposure reaction causing a burn on her cheeks and hair, but she has not been in the sun recently and does not feel a burning sensation with the current rash.  She resides in a healthcare facility where her linens are laundered by staff. To rule out contact allergens, she covers her head pillow with a clean T-shirt.       Past Medical History:  Diagnosis Date   Allergy    Anxiety    Arthritis    Bursitis    right leg    Family history of kidney cancer    Family history of lung cancer    Family history of uterine cancer    GERD (gastroesophageal reflux disease)    History of blood transfusion    History of uterine cancer 01/19/2019   Hyperlipidemia    Hypertension    Insomnia    Non Hodgkin's lymphoma (HCC) 2017-2019   cured   Osteopenia    MILD   Urine retention    Uterine cancer (HCC)    at age 78    Past Surgical History:  Procedure Laterality Date   ABDOMINAL HYSTERECTOMY     AGE 68   APPENDECTOMY     AGE 58   BLADDER SURGERY     Catarct Surgery     ECTOPIC PREGNANCY SURGERY     age 52   LAPAROSCOPIC SMALL BOWEL RESECTION     NM PET DX LYMPHOMA  10/2018   IN REMISSION   occular pressure     OTHER SURGICAL HISTORY     REPLACEMENT TOTAL KNEE BILATERAL     TONSILLECTOMY AND ADENOIDECTOMY     age 73 or 25   UTERINE CANCER SURGERY     age 55- located in vagina    Allergies  Allergen Reactions   Cefuroxime    Clindamycin/Lincomycin  Levofloxacin    Lisinopril    Prochlorperazine Edisylate    Psyllium    Sulfa Antibiotics     Outpatient Encounter Medications as of 08/23/2024  Medication Sig   acetaminophen  (TYLENOL ) 500 MG tablet Take 500 mg by mouth every 6 (six) hours as needed.   acetaminophen  (TYLENOL ) 500 MG tablet Take 500 mg by mouth 2 (two) times daily.   bimatoprost  (LUMIGAN ) 0.01 % SOLN Place 1 drop into both eyes at bedtime.   brimonidine  (ALPHAGAN ) 0.15 % ophthalmic solution 1 drop 3 (three) times daily.   cetirizine (ZYRTEC) 10 MG tablet Take 10 mg by mouth daily.   [EXPIRED] COVID-19 mRNA vaccine, Pfizer, 30 MCG/0.3ML injection Inject 0.3 mLs into the muscle once for 1 dose.   cycloSPORINE  (RESTASIS ) 0.05 % ophthalmic emulsion Place 1 drop into both eyes 2 (two) times daily.   eszopiclone  (LUNESTA ) 2 MG TABS tablet Take 1 tablet (2 mg total) by mouth at bedtime. Take immediately before bedtime   loperamide (IMODIUM) 1 MG/5ML solution Take 2 mg by mouth as needed for  diarrhea or loose stools.   Methylcellulose, Laxative, (CITRUCEL PO) Take 1 Dose by mouth in the morning and at bedtime.   nitrofurantoin  (MACRODANTIN ) 50 MG capsule Take 1 capsule (50 mg total) by mouth daily.   olopatadine (PATADAY) 0.1 % ophthalmic solution Place 1 drop into both eyes daily.   ondansetron  (ZOFRAN ) 4 MG tablet Take 4 mg by mouth every 6 (six) hours as needed.   polyethylene glycol (MIRALAX / GLYCOLAX) 17 g packet Take 17 g by mouth as needed. Daily and prn   Propylene Glycol 0.6 % SOLN Apply 1 drop to eye 3 (three) times daily.   triamcinolone  cream (KENALOG ) 0.1 % Apply 1 Application topically 2 (two) times daily. For 7 days & every 12 hours as needed for 1 month   [DISCONTINUED] cycloSPORINE  (RESTASIS ) 0.05 % ophthalmic emulsion Place 2 drops into both eyes 2 (two) times daily. (Patient not taking: Reported on 08/22/2024)   [DISCONTINUED] Polyethyl Glycol-Propyl Glycol (SYSTANE) 0.4-0.3 % SOLN Apply 1 drop to eye in the morning, at noon, and at bedtime. (Patient not taking: Reported on 08/22/2024)   No facility-administered encounter medications on file as of 08/23/2024.    Review of Systems:  Review of Systems  Constitutional:  Negative for activity change and appetite change.  HENT: Negative.    Respiratory:  Negative for cough and shortness of breath.   Cardiovascular:  Negative for leg swelling.  Gastrointestinal:  Negative for constipation.  Genitourinary: Negative.   Musculoskeletal:  Negative for arthralgias, gait problem and myalgias.  Skin:  Positive for rash.  Neurological:  Negative for dizziness and weakness.  Psychiatric/Behavioral:  Negative for confusion, dysphoric mood and sleep disturbance.     Health Maintenance  Topic Date Due   Medicare Annual Wellness (AWV)  08/10/2024   Influenza Vaccine  10/13/2024 (Originally 07/15/2024)   COVID-19 Vaccine (7 - 2025-26 season) 10/13/2024 (Originally 08/15/2024)   DTaP/Tdap/Td (3 - Td or Tdap) 05/21/2030    Pneumococcal Vaccine: 50+ Years  Completed   DEXA SCAN  Completed   Zoster Vaccines- Shingrix  Completed   HPV VACCINES  Aged Out   Meningococcal B Vaccine  Aged Out    Physical Exam: Vitals:   08/23/24 0843  BP: 104/60  Pulse: 64  Temp: (!) 97.5 F (36.4 C)  SpO2: 97%  Weight: 177 lb (80.3 kg)  Height: 5' 4 (1.626 m)   Body mass index is 30.38 kg/m. Physical  Exam Vitals reviewed.  Constitutional:      Appearance: Normal appearance.  HENT:     Head: Normocephalic.     Nose: Nose normal.     Mouth/Throat:     Mouth: Mucous membranes are moist.     Pharynx: Oropharynx is clear.  Eyes:     Pupils: Pupils are equal, round, and reactive to light.  Cardiovascular:     Rate and Rhythm: Normal rate and regular rhythm.     Pulses: Normal pulses.     Heart sounds: Normal heart sounds. No murmur heard. Pulmonary:     Effort: Pulmonary effort is normal.     Breath sounds: Normal breath sounds.  Abdominal:     General: Abdomen is flat. Bowel sounds are normal.     Palpations: Abdomen is soft.  Musculoskeletal:        General: No swelling.     Cervical back: Neck supple.  Skin:    General: Skin is warm.     Comments: Some Erythema in her Neck area and her Cheeks  Neurological:     General: No focal deficit present.     Mental Status: She is alert and oriented to person, place, and time.  Psychiatric:        Mood and Affect: Mood normal.        Thought Content: Thought content normal.     Labs reviewed: Basic Metabolic Panel: Recent Labs    10/15/23 0000 05/31/24 0000  NA 142 139  K 4.3 4.2  CL 106 103  CO2 28* 23*  BUN 16 16  CREATININE 0.7 0.7  CALCIUM  9.3 9.4  TSH  --  2.98   Liver Function Tests: Recent Labs    10/15/23 0000 05/31/24 0000  AST 28 29  ALT 19 20  ALKPHOS 89 101  ALBUMIN 3.8 4.0   No results for input(s): LIPASE, AMYLASE in the last 8760 hours. No results for input(s): AMMONIA in the last 8760 hours. CBC: Recent Labs     10/15/23 0000 05/31/24 0000  WBC 4.5 5.0  HGB 12.3 12.9  HCT 37 39  PLT 159 178   Lipid Panel: Recent Labs    01/22/24 0000 05/31/24 0000  CHOL 196 242*  HDL 44 51  LDLCALC 129 163  TRIG 114 139   No results found for: HGBA1C  Procedures since last visit: No results found.  Assessment/Plan  Assessment and Plan    Allergic contact dermatitis of face and Neck Acute bright red, non-itchy rash on cheeks likely due to contact allergen.  - Continue Triamcinolone  steroid cream for 1-2 weeks. - Contact dermatologist at [facility] if symptoms worsen. - Consider referral to dermatologist if no improvement in 1-2 weeks. - Instruct laundry service to double wash sheets and towels.  Ocular hypertension Elevated intraocular pressure requiring medication adjustment. Recent change in administration method to healthcare staff. Risk of increased pressure with systemic steroids. - Follow up with ophthalmologist Dr. Waylan in one week to reassess intraocular pressure and treatment effectiveness.        Labs/tests ordered:  * No order type specified * Next appt:  08/30/2024

## 2024-08-29 ENCOUNTER — Encounter: Admitting: Adult Health

## 2024-08-29 ENCOUNTER — Encounter: Payer: Self-pay | Admitting: Internal Medicine

## 2024-08-30 ENCOUNTER — Encounter: Payer: Self-pay | Admitting: Internal Medicine

## 2024-08-30 ENCOUNTER — Non-Acute Institutional Stay: Admitting: Internal Medicine

## 2024-08-30 ENCOUNTER — Other Ambulatory Visit: Payer: Self-pay | Admitting: Orthopedic Surgery

## 2024-08-30 VITALS — BP 122/68 | HR 59 | Temp 98.0°F | Ht 64.0 in | Wt 177.0 lb

## 2024-08-30 DIAGNOSIS — N319 Neuromuscular dysfunction of bladder, unspecified: Secondary | ICD-10-CM | POA: Diagnosis not present

## 2024-08-30 DIAGNOSIS — G4700 Insomnia, unspecified: Secondary | ICD-10-CM | POA: Diagnosis not present

## 2024-08-30 DIAGNOSIS — E782 Mixed hyperlipidemia: Secondary | ICD-10-CM

## 2024-08-30 DIAGNOSIS — H04123 Dry eye syndrome of bilateral lacrimal glands: Secondary | ICD-10-CM | POA: Diagnosis not present

## 2024-08-30 DIAGNOSIS — I10 Essential (primary) hypertension: Secondary | ICD-10-CM

## 2024-08-30 DIAGNOSIS — H40059 Ocular hypertension, unspecified eye: Secondary | ICD-10-CM

## 2024-08-30 DIAGNOSIS — R2681 Unsteadiness on feet: Secondary | ICD-10-CM | POA: Diagnosis not present

## 2024-08-30 DIAGNOSIS — E559 Vitamin D deficiency, unspecified: Secondary | ICD-10-CM

## 2024-08-30 DIAGNOSIS — H401131 Primary open-angle glaucoma, bilateral, mild stage: Secondary | ICD-10-CM | POA: Diagnosis not present

## 2024-08-30 DIAGNOSIS — K5909 Other constipation: Secondary | ICD-10-CM | POA: Diagnosis not present

## 2024-08-30 MED ORDER — ESZOPICLONE 2 MG PO TABS: 2.0000 mg | ORAL_TABLET | Freq: Every day | ORAL | 5 refills | Status: AC

## 2024-08-30 MED ORDER — VITAMIN D3 125 MCG (5000 UT) PO CAPS: 5000.0000 [IU] | ORAL_CAPSULE | Freq: Every day | ORAL | Status: AC

## 2024-08-30 MED ORDER — ROSUVASTATIN CALCIUM 5 MG PO TABS: 5.0000 mg | ORAL_TABLET | ORAL | 0 refills | Status: AC

## 2024-08-30 NOTE — Progress Notes (Unsigned)
 Location:  [facility] features editor of Service:  Clinic (12)  Provider:   Code Status:  Goals of Care:     01/25/2024    2:09 PM  Advanced Directives  Does Patient Have a Medical Advance Directive? Yes  Type of Estate agent of [place];Living will  Does patient want to make changes to medical advance directive? No - Patient declined  Copy of Healthcare Power of Attorney in Chart? Yes - validated most recent copy scanned in chart (See row information)     Chief Complaint  Patient presents with   Follow-up    3 month follow up Discussed the need for AWV patient declined at this time    HPI: Patient is a 89 y.o. female seen today for medical management of chronic diseases.    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   Follows with Urology for Neuropathic Bladder They recommended Botox which she refused  Continues In And Out Cath  Discussed the use of AI scribe software for clinical note transcription with the patient, who gave verbal consent to proceed.  History of Present Illness     Past Medical History:  Diagnosis Date   Allergy    Anxiety    Arthritis    Bursitis    right leg   Family history of kidney cancer    Family history of lung cancer    Family history of uterine cancer    GERD (gastroesophageal reflux disease)    History of blood transfusion    History of uterine cancer 01/19/2019   Hyperlipidemia    Hypertension    Insomnia    Non Hodgkin's lymphoma (HCC) 2017-2019   cured   Osteopenia    MILD   Urine retention    Uterine cancer (HCC)    at age 67     Past Surgical History:  Procedure Laterality Date   ABDOMINAL HYSTERECTOMY     AGE 89   APPENDECTOMY     AGE 66   BLADDER SURGERY     Catarct Surgery     ECTOPIC PREGNANCY SURGERY     age 18   LAPAROSCOPIC SMALL BOWEL RESECTION     NM PET DX LYMPHOMA  10/2018   IN REMISSION   occular pressure     OTHER SURGICAL HISTORY     REPLACEMENT TOTAL KNEE BILATERAL     TONSILLECTOMY AND ADENOIDECTOMY     age 81 or 51   UTERINE CANCER SURGERY     age 43- located in vagina    Allergies  Allergen Reactions   Cefuroxime    Clindamycin/Lincomycin    Levofloxacin    Lisinopril    Prochlorperazine Edisylate    Psyllium    Sulfa Antibiotics     Outpatient Encounter Medications as of 08/30/2024  Medication Sig   acetaminophen  (TYLENOL ) 500 MG tablet Take 500 mg by mouth every 6 (six) hours as needed.   acetaminophen  (TYLENOL ) 500 MG tablet Take 500 mg by mouth 2 (two) times daily.   bimatoprost  (LUMIGAN ) 0.01 % SOLN Place 1 drop into both eyes at bedtime.  brimonidine  (ALPHAGAN ) 0.15 % ophthalmic solution 1 drop 3 (three) times daily.   cetirizine (ZYRTEC) 10 MG tablet Take 10 mg by mouth daily.   Cholecalciferol (VITAMIN D3) 125 MCG (5000 UT) CAPS Take 1 capsule (5,000 Units total) by mouth daily.   cycloSPORINE  (RESTASIS ) 0.05 % ophthalmic emulsion Place 1 drop into both eyes 2 (two) times daily.   eszopiclone  (LUNESTA ) 2 MG TABS tablet Take 1 tablet (2 mg total) by mouth at bedtime. Take immediately before bedtime   loperamide (IMODIUM) 1 MG/5ML solution Take 2 mg by mouth as needed for diarrhea or loose stools.   Methylcellulose, Laxative, (CITRUCEL PO) Take 1 Dose by mouth in the morning and at bedtime.   nitrofurantoin  (MACRODANTIN ) 50 MG capsule Take 1 capsule (50 mg total) by mouth daily.   olopatadine (PATADAY) 0.1 % ophthalmic solution Place 1 drop into both eyes daily.   ondansetron  (ZOFRAN ) 4 MG tablet Take 4 mg by mouth every 6 (six) hours as needed.   polyethylene  glycol (MIRALAX / GLYCOLAX) 17 g packet Take 17 g by mouth as needed. Daily and prn   Propylene Glycol 0.6 % SOLN Apply 1 drop to eye 3 (three) times daily.   rosuvastatin  (CRESTOR ) 5 MG tablet Take 1 tablet (5 mg total) by mouth once a week.   triamcinolone  cream (KENALOG ) 0.1 % Apply 1 Application topically 2 (two) times daily. For 7 days & every 12 hours as needed for 1 month   [DISCONTINUED] eszopiclone  (LUNESTA ) 2 MG TABS tablet Take 1 tablet (2 mg total) by mouth at bedtime. Take immediately before bedtime   No facility-administered encounter medications on file as of 08/30/2024.    Review of Systems:  Review of Systems  Health Maintenance  Topic Date Due   Influenza Vaccine  10/13/2024 (Originally 07/15/2024)   Medicare Annual Wellness (AWV)  11/14/2024 (Originally 08/10/2024)   COVID-19 Vaccine (8 - Moderna risk 2024-25 season) 02/25/2025   DTaP/Tdap/Td (3 - Td or Tdap) 05/21/2030   Pneumococcal Vaccine: 50+ Years  Completed   DEXA SCAN  Completed   Zoster Vaccines- Shingrix  Completed   HPV VACCINES  Aged Out   Meningococcal B Vaccine  Aged Out    Physical Exam: Vitals:   08/30/24 1120  BP: 122/68  Pulse: (!) 59  Temp: 98 F (36.7 C)  SpO2: 98%  Weight: 177 lb (80.3 kg)  Height: 5' 4 (1.626 m)   Body mass index is 30.38 kg/m. Physical Exam  Labs reviewed: Basic Metabolic Panel: Recent Labs    10/15/23 0000 05/31/24 0000  NA 142 139  K 4.3 4.2  CL 106 103  CO2 28* 23*  BUN 16 16  CREATININE 0.7 0.7  CALCIUM  9.3 9.4  TSH  --  2.98   Liver Function Tests: Recent Labs    10/15/23 0000 05/31/24 0000  AST 28 29  ALT 19 20  ALKPHOS 89 101  ALBUMIN 3.8 4.0   No results for input(s): LIPASE, AMYLASE in the last 8760 hours. No results for input(s): AMMONIA in the last 8760 hours. CBC: Recent Labs    10/15/23 0000 05/31/24 0000  WBC 4.5 5.0  HGB 12.3 12.9  HCT 37 39  PLT 159 178   Lipid Panel: Recent Labs    01/22/24 0000 05/31/24 0000   CHOL 196 242*  HDL 44 51  LDLCALC 129 163  TRIG 114 139   No results found for: HGBA1C  Procedures since last visit: No results found.  Assessment/Plan 1.  Mixed hyperlipidemia (Primary) ***  2. Neurogenic bladder ***  3. Vitamin D  deficiency ***  4. Raised intraocular pressure, unspecified laterality ***  5. Insomnia, unspecified type ***  6. Hypertension, essential, benign ***  7. Unstable gait ***  8. Chronic constipation with overflow incontinence ***    Labs/tests ordered:  Labs ordered Next appt:  Visit date not found

## 2024-08-30 NOTE — Patient Instructions (Signed)
 Vitamin D  5000 units can be picked Over the counter  And Statin can be taken to start with once a week

## 2024-09-01 ENCOUNTER — Telehealth: Payer: Self-pay | Admitting: Internal Medicine

## 2024-09-01 NOTE — Telephone Encounter (Signed)
 Patient nurse called yesterday evening with her c/o Cough Runny nose hoarse Voice Tested for Covid and Was Positive Started oN Paxlovid 

## 2024-09-05 ENCOUNTER — Encounter

## 2024-09-27 ENCOUNTER — Non-Acute Institutional Stay: Payer: Self-pay | Admitting: Internal Medicine

## 2024-09-27 ENCOUNTER — Encounter: Payer: Self-pay | Admitting: Internal Medicine

## 2024-09-27 DIAGNOSIS — I10 Essential (primary) hypertension: Secondary | ICD-10-CM

## 2024-09-27 DIAGNOSIS — R531 Weakness: Secondary | ICD-10-CM

## 2024-09-27 DIAGNOSIS — N319 Neuromuscular dysfunction of bladder, unspecified: Secondary | ICD-10-CM

## 2024-09-27 DIAGNOSIS — R42 Dizziness and giddiness: Secondary | ICD-10-CM

## 2024-09-27 NOTE — Progress Notes (Unsigned)
 [facility] Room Number: 629 P Place of Service:  ALF [phone]) Provider:  Charlanne Fredia CROME, MD  Patient Care Team: Charlanne Fredia CROME, MD as PCP - General (Internal Medicine) Cary Doffing, MD as Consulting Physician (Dermatology) Barbarann Oneil BROCKS, MD (Inactive) as Consulting Physician (Orthopedic Surgery) Waylan Cain, MD as Consulting Physician (Ophthalmology) Teressa Toribio SQUIBB, MD (Inactive) as Attending Physician (Gastroenterology) Magdalen Pasco RAMAN, DPM as Consulting Physician (Podiatry) Carolee Sherwood JONETTA DOUGLAS, MD as Consulting Physician (Urology) Arlana Arnt, MD as Consulting Physician (Otolaryngology)  Extended Emergency Contact Information Primary Emergency Contact: Bea Gladis SQUIBB Address: [address]          [place], [place] [address] [place] Mobile Phone: [phone] Relation: Son Secondary Emergency Contact: Bea Toribio PARAS Address: [address]          [place], [place] [address] [place] Mobile Phone: [phone] Relation: Son  Code Status:  Full Code Goals of care: Advanced Directive information    01/25/2024    2:09 PM  Advanced Directives  Does Patient Have a Medical Advance Directive? Yes  Type of Estate agent of [place];Living will  Does patient want to make changes to medical advance directive? No - Patient declined  Copy of Healthcare Power of Attorney in Chart? Yes - validated most recent copy scanned in chart (See row information)     Chief Complaint  Patient presents with   Acute Visit    HPI:  Pt is a 89 y.o. female seen today for an acute visit for Dizziness and feeling weak  Lives in AL in [place]   The patient presents with dizziness, headaches, and blurred vision following recent dental work. Patient says that she had Dental procedure done on Thurs Procedure was almost for more then an Hour Dizziness, headaches, and blurred vision have persisted  since a dental procedure for a filling replacement.  she felt woozy after the procedure afterward and needed assistance to get home. Symptoms intensified when she went to  exercise class on Monday , leading to an inability to continue due to severe headache and blurred vision.   She returned to AL using her scooter and experienced a flushed face and elevated blood pressure Checked byy Nurse was 180/88 later that night.  No nausea, vomiting, or diarrhea. Appetite and fluid intake are adequate. She has recovered from a mild COVID-19 infection three weeks ago with no ongoing respiratory symptoms.  Right now she still feels weak But eating and Drinking well  Past Medical History:  Diagnosis Date   Allergy    Anxiety    Arthritis    Bursitis    right leg   Family history of kidney cancer    Family history of lung cancer    Family history of uterine cancer    GERD (gastroesophageal reflux disease)    History of blood transfusion    History of uterine cancer 01/19/2019   Hyperlipidemia    Hypertension    Insomnia    Non Hodgkin's lymphoma (HCC) 2017-2019   cured   Osteopenia    MILD   Urine retention    Uterine cancer (HCC)    at age 10   Past Surgical History:  Procedure Laterality Date   ABDOMINAL HYSTERECTOMY     AGE 59   APPENDECTOMY     AGE 88   BLADDER SURGERY     Catarct Surgery     ECTOPIC PREGNANCY SURGERY  age 21   LAPAROSCOPIC SMALL BOWEL RESECTION     NM PET DX LYMPHOMA  10/2018   IN REMISSION   occular pressure     OTHER SURGICAL HISTORY     REPLACEMENT TOTAL KNEE BILATERAL     TONSILLECTOMY AND ADENOIDECTOMY     age 45 or 24   UTERINE CANCER SURGERY     age 37- located in vagina    Allergies  Allergen Reactions   Cefuroxime    Clindamycin/Lincomycin    Levofloxacin    Lisinopril    Prochlorperazine Edisylate    Psyllium    Sulfa Antibiotics     Outpatient Encounter Medications as of 09/27/2024  Medication Sig   acetaminophen  (TYLENOL ) 500 MG  tablet Take 500 mg by mouth every 6 (six) hours as needed.   acetaminophen  (TYLENOL ) 500 MG tablet Take 500 mg by mouth 2 (two) times daily.   bimatoprost  (LUMIGAN ) 0.01 % SOLN Place 1 drop into both eyes at bedtime.   brimonidine -timolol (COMBIGAN) 0.2-0.5 % ophthalmic solution Place 1 drop into both eyes every 12 (twelve) hours.   cetirizine (ZYRTEC) 10 MG tablet Take 10 mg by mouth daily.   Cholecalciferol (VITAMIN D3) 125 MCG (5000 UT) CAPS Take 1 capsule (5,000 Units total) by mouth daily.   cycloSPORINE  (RESTASIS ) 0.05 % ophthalmic emulsion Place 1 drop into both eyes 2 (two) times daily.   eszopiclone  (LUNESTA ) 2 MG TABS tablet Take 1 tablet (2 mg total) by mouth at bedtime. Take immediately before bedtime   guaiFENesin -dextromethorphan (ROBITUSSIN DM) 100-10 MG/5ML syrup Take 10 mLs by mouth every 6 (six) hours as needed for cough.   loperamide (IMODIUM) 1 MG/5ML solution Take 2 mg by mouth as needed for diarrhea or loose stools.   Methylcellulose, Laxative, (CITRUCEL PO) Take 1 Dose by mouth in the morning and at bedtime.   nitrofurantoin  (MACRODANTIN ) 50 MG capsule Take 1 capsule (50 mg total) by mouth daily.   olopatadine (PATADAY) 0.1 % ophthalmic solution Place 1 drop into both eyes daily.   ondansetron  (ZOFRAN ) 4 MG tablet Take 4 mg by mouth every 6 (six) hours as needed.   polyethylene glycol (MIRALAX / GLYCOLAX) 17 g packet Take 17 g by mouth as needed. Daily and prn   Propylene Glycol 0.6 % SOLN Apply 1 drop to eye 3 (three) times daily.   rosuvastatin  (CRESTOR ) 5 MG tablet Take 1 tablet (5 mg total) by mouth once a week.   brimonidine  (ALPHAGAN ) 0.15 % ophthalmic solution 1 drop 3 (three) times daily. (Patient not taking: Reported on 09/27/2024)   triamcinolone  cream (KENALOG ) 0.1 % Apply 1 Application topically 2 (two) times daily. For 7 days & every 12 hours as needed for 1 month (Patient not taking: Reported on 09/27/2024)   No facility-administered encounter medications on  file as of 09/27/2024.    Review of Systems  Constitutional:  Positive for activity change. Negative for appetite change.  HENT: Negative.    Respiratory:  Negative for cough and shortness of breath.   Cardiovascular:  Negative for leg swelling.  Gastrointestinal:  Negative for constipation.  Genitourinary: Negative.   Musculoskeletal:  Negative for arthralgias, gait problem and myalgias.  Skin: Negative.   Neurological:  Positive for dizziness and weakness.  Psychiatric/Behavioral:  Negative for confusion, dysphoric mood and sleep disturbance.     Immunization History  Administered Date(s) Administered   Fluad Quad(high Dose 65+) 09/19/2022   Fluzone  Influenza virus vaccine,trivalent (IIV3), split virus 08/29/2009, 09/02/2010, 08/30/2012   INFLUENZA, HIGH  DOSE SEASONAL PF 09/26/2014, 08/17/2015, 08/19/2016, 08/20/2017, 08/24/2018, 09/23/2019, 10/05/2020   Influenza-Unspecified 08/20/2017, 10/05/2020, 09/18/2021, 08/14/2023   Moderna Covid-19 Fall Seasonal Vaccine 40yrs & older 08/23/2024   Moderna SARS-COV2 Booster Vaccination 05/19/2021, 03/06/2023   Moderna Sars-Covid-2 Vaccination 12/27/2019, 01/24/2020, 10/25/2020, 09/25/2021, 04/30/2022, 11/05/2022   Pneumococcal Conjugate-13 09/07/2014   Pneumococcal Polysaccharide-23 09/21/2007   Tdap 05/23/2010, 05/21/2020   Unspecified SARS-COV-2 Vaccination 08/28/2024   Zoster Recombinant(Shingrix) 09/22/2017, 11/25/2017   Zoster, Live 10/24/2005   Pertinent  Health Maintenance Due  Topic Date Due   Influenza Vaccine  10/13/2024 (Originally 07/15/2024)   DEXA SCAN  Completed      07/20/2023    1:07 PM 08/11/2023   11:34 AM 10/20/2023    1:05 PM 12/30/2023   12:47 PM 01/25/2024    2:09 PM  Fall Risk  Falls in the past year? 0 0 0 0 0  Was there an injury with Fall? 0 0 0 0 0  Fall Risk Category Calculator 0 0 0 0 0  Patient at Risk for Falls Due to  Impaired balance/gait  History of fall(s);Impaired balance/gait No Fall Risks  Fall  risk Follow up Falls evaluation completed Falls evaluation completed;Education provided Falls evaluation completed Falls evaluation completed;Education provided Falls evaluation completed   Functional Status Survey:    Vitals:   09/27/24 1132  BP: (!) 121/58  Pulse: 63  Resp: 16  Temp: 97.9 F (36.6 C)  SpO2: 97%  Height: 5' 4 (1.626 m)   Body mass index is 30.38 kg/m. Physical Exam Vitals reviewed.  Constitutional:      Appearance: Normal appearance.  HENT:     Head: Normocephalic.     Nose: Nose normal.     Mouth/Throat:     Mouth: Mucous membranes are moist.     Pharynx: Oropharynx is clear.  Eyes:     Pupils: Pupils are equal, round, and reactive to light.  Cardiovascular:     Rate and Rhythm: Normal rate and regular rhythm.     Pulses: Normal pulses.     Heart sounds: Normal heart sounds. No murmur heard. Pulmonary:     Effort: Pulmonary effort is normal.     Breath sounds: Normal breath sounds.  Abdominal:     General: Abdomen is flat. Bowel sounds are normal.     Palpations: Abdomen is soft.  Musculoskeletal:        General: No swelling.     Cervical back: Neck supple.  Skin:    General: Skin is warm.  Neurological:     General: No focal deficit present.     Mental Status: She is alert and oriented to person, place, and time.     Comments: Negative pronator drift. Finger-to-nose test normal. Mild dizziness on head movement. Negative Romberg test. Gait normal. No Nystagmus Feels weak when she stands up  Psychiatric:        Mood and Affect: Mood normal.        Thought Content: Thought content normal.     Labs reviewed: Recent Labs    10/15/23 0000 05/31/24 0000  NA 142 139  K 4.3 4.2  CL 106 103  CO2 28* 23*  BUN 16 16  CREATININE 0.7 0.7  CALCIUM  9.3 9.4   Recent Labs    10/15/23 0000 05/31/24 0000  AST 28 29  ALT 19 20  ALKPHOS 89 101  ALBUMIN 3.8 4.0   Recent Labs    10/15/23 0000 05/31/24 0000  WBC 4.5 5.0  HGB 12.3  12.9   HCT 37 39  PLT 159 178   Lab Results  Component Value Date   TSH 2.98 05/31/2024   No results found for: HGBA1C Lab Results  Component Value Date   CHOL 242 (A) 05/31/2024   HDL 51 05/31/2024   LDLCALC 163 05/31/2024   TRIG 139 05/31/2024   CHOLHDL 3 08/24/2018    Significant Diagnostic Results in last 30 days:  No results found.  Assessment/Plan  Headache with dizziness   post-dental procedure. Differential includes vertigo from possible inner ear dysfunction, dehydration, or blood pressure fluctuations. No stroke signs. Orthostatic showed Lying 134/72 Standing 118/70  Patient is mildily Orthostatic - Order blood tests for electrolytes. CBC,CMP Stat - Encourage increased fluid intake. - Monitor symptoms and blood pressure closely.  Addendum All Labs came back WNL Will continue to montior for now Family/ staff Communication:   Labs/tests ordered:

## 2024-10-09 DIAGNOSIS — Z23 Encounter for immunization: Secondary | ICD-10-CM | POA: Diagnosis not present

## 2024-10-18 DIAGNOSIS — H401131 Primary open-angle glaucoma, bilateral, mild stage: Secondary | ICD-10-CM | POA: Diagnosis not present

## 2024-10-18 DIAGNOSIS — H04123 Dry eye syndrome of bilateral lacrimal glands: Secondary | ICD-10-CM | POA: Diagnosis not present

## 2024-10-24 DIAGNOSIS — H903 Sensorineural hearing loss, bilateral: Secondary | ICD-10-CM | POA: Diagnosis not present

## 2024-10-24 DIAGNOSIS — Z822 Family history of deafness and hearing loss: Secondary | ICD-10-CM | POA: Diagnosis not present

## 2024-11-07 DIAGNOSIS — Z719 Counseling, unspecified: Secondary | ICD-10-CM

## 2024-11-22 DIAGNOSIS — E7800 Pure hypercholesterolemia, unspecified: Secondary | ICD-10-CM | POA: Diagnosis not present

## 2024-11-22 DIAGNOSIS — E559 Vitamin D deficiency, unspecified: Secondary | ICD-10-CM | POA: Diagnosis not present

## 2024-11-22 DIAGNOSIS — E785 Hyperlipidemia, unspecified: Secondary | ICD-10-CM | POA: Diagnosis not present

## 2024-11-22 DIAGNOSIS — Z9109 Other allergy status, other than to drugs and biological substances: Secondary | ICD-10-CM | POA: Diagnosis not present

## 2024-11-22 LAB — LIPID PANEL
Cholesterol: 173 (ref 0–200)
HDL: 46 (ref 35–70)
LDL Cholesterol: 104
LDl/HDL Ratio: 3.7
Triglycerides: 113 (ref 40–160)

## 2024-11-22 LAB — COMPREHENSIVE METABOLIC PANEL WITH GFR
Albumin: 3.8 (ref 3.5–5.0)
Calcium: 9.1 (ref 8.7–10.7)
Globulin: 2.2
eGFR: 73

## 2024-11-22 LAB — VITAMIN D 25 HYDROXY (VIT D DEFICIENCY, FRACTURES): Vit D, 25-Hydroxy: 24.1

## 2024-11-22 LAB — HEPATIC FUNCTION PANEL
ALT: 29 U/L (ref 7–35)
AST: 32 (ref 13–35)
Alkaline Phosphatase: 94 (ref 25–125)
Bilirubin, Total: 0.3

## 2024-11-22 LAB — BASIC METABOLIC PANEL WITH GFR
BUN: 17 (ref 4–21)
CO2: 25 — AB (ref 13–22)
Chloride: 104 (ref 99–108)
Creatinine: 0.8 (ref 0.5–1.1)
Glucose: 101
Potassium: 4.2 meq/L (ref 3.5–5.1)
Sodium: 144 (ref 137–147)

## 2024-11-28 ENCOUNTER — Encounter: Payer: Self-pay | Admitting: Adult Health

## 2024-11-28 ENCOUNTER — Non-Acute Institutional Stay: Payer: Self-pay | Admitting: Adult Health

## 2024-11-28 VITALS — BP 120/80 | HR 57 | Temp 97.3°F | Ht 64.0 in

## 2024-11-28 DIAGNOSIS — E782 Mixed hyperlipidemia: Secondary | ICD-10-CM | POA: Diagnosis not present

## 2024-11-28 DIAGNOSIS — I10 Essential (primary) hypertension: Secondary | ICD-10-CM

## 2024-11-28 DIAGNOSIS — E559 Vitamin D deficiency, unspecified: Secondary | ICD-10-CM | POA: Diagnosis not present

## 2024-11-28 DIAGNOSIS — R339 Retention of urine, unspecified: Secondary | ICD-10-CM

## 2024-11-28 DIAGNOSIS — C52 Malignant neoplasm of vagina: Secondary | ICD-10-CM

## 2024-11-28 DIAGNOSIS — R413 Other amnesia: Secondary | ICD-10-CM

## 2024-11-28 NOTE — Progress Notes (Unsigned)
 Location:  [facility]  POS: Clinic  Provider: Tawni America, ANP  Code Status: *** Goals of Care:     01/25/2024    2:09 PM  Advanced Directives  Does Patient Have a Medical Advance Directive? Yes  Type of Estate Agent of [place];Living will  Does patient want to make changes to medical advance directive? No - Patient declined  Copy of Healthcare Power of Attorney in Chart? Yes - validated most recent copy scanned in chart (See row information)     Chief Complaint  Patient presents with   Follow-up    Patient would like discuss eyes being blurry. Patient would like talk about memory loss. Patient would like to discuss tylenol .    HPI: Discussed the use of AI scribe software for clinical note transcription with the patient, who gave verbal consent to proceed.  History of Present Illness Linda [name] is a 89 year old female who presents for a routine check-up and medication review.  Oropharyngeal symptoms - Intermittent dry throat - Symptom improves with use of cough drops  Musculoskeletal pain - Intermittent lower back pain occurring in mornings or early afternoons - Pain managed with Tylenol  twice daily and an extra dose as needed  Gastrointestinal function - Chronic bowel issues managed with diet, prune juice, and Citrucel - Miralax used rarely as needed  Cutaneous trauma - Significant bruising and tenderness of right leg after trauma - Symptoms are improving  Visual disturbance - Blurred vision - Uses four eye drops administered by nursing staff - Vision improved after discontinuing prior eye ointment that caused blurriness  Cognitive function - Mild memory impairment with difficulty recalling first names - No use of memory-enhancing products  Urinary management - Uses a catheter - Takes prophylactic antibiotics for urinary infection prevention - No dysuria     Past Medical History:  Diagnosis Date   Allergy    Anxiety     Arthritis    Bursitis    right leg   Family history of kidney cancer    Family history of lung cancer    Family history of uterine cancer    GERD (gastroesophageal reflux disease)    History of blood transfusion    History of uterine cancer 01/19/2019   Hyperlipidemia    Hypertension    Insomnia    Non Hodgkin's lymphoma (HCC) 2017-2019   cured   Osteopenia    MILD   Urine retention    Uterine cancer (HCC)    at age 53    Past Surgical History:  Procedure Laterality Date   ABDOMINAL HYSTERECTOMY     AGE 47   APPENDECTOMY     AGE 9   BLADDER SURGERY     Catarct Surgery     ECTOPIC PREGNANCY SURGERY     age 89   LAPAROSCOPIC SMALL BOWEL RESECTION     NM PET DX LYMPHOMA  10/2018   IN REMISSION   occular pressure     OTHER SURGICAL HISTORY     REPLACEMENT TOTAL KNEE BILATERAL     TONSILLECTOMY AND ADENOIDECTOMY     age 54 or 49   UTERINE CANCER SURGERY     age 37- located in vagina    Allergies[1]  Outpatient Encounter Medications as of 11/28/2024  Medication Sig   acetaminophen  (TYLENOL ) 500 MG tablet Take 500 mg by mouth every 6 (six) hours as needed.   acetaminophen  (TYLENOL ) 500 MG tablet Take 500 mg by mouth 2 (two) times daily.  bimatoprost (LUMIGAN) 0.01 % SOLN Place 1 drop into both eyes at bedtime.   brimonidine -timolol (COMBIGAN) 0.2-0.5 % ophthalmic solution Place 1 drop into both eyes every 12 (twelve) hours.   cetirizine (ZYRTEC) 10 MG tablet Take 10 mg by mouth daily.   Cholecalciferol (VITAMIN D3) 125 MCG (5000 UT) CAPS Take 1 capsule (5,000 Units total) by mouth daily.   cycloSPORINE  (RESTASIS ) 0.05 % ophthalmic emulsion Place 1 drop into both eyes 2 (two) times daily.   eszopiclone  (LUNESTA ) 2 MG TABS tablet Take 1 tablet (2 mg total) by mouth at bedtime. Take immediately before bedtime   guaiFENesin -dextromethorphan (ROBITUSSIN DM) 100-10 MG/5ML syrup Take 10 mLs by mouth every 6 (six) hours as needed for cough.   hydrALAZINE (APRESOLINE) 10 MG  tablet Take 10 mg by mouth. Give 10 mg by mouth as needed for for any systolic BP over 819 MANUALLY recheck 1hr after dose report B/P if continued High B/P   loperamide (IMODIUM) 1 MG/5ML solution Take 2 mg by mouth as needed for diarrhea or loose stools.   Methylcellulose, Laxative, (CITRUCEL PO) Take 1 Dose by mouth in the morning and at bedtime.   nitrofurantoin  (MACRODANTIN ) 50 MG capsule Take 1 capsule (50 mg total) by mouth daily.   olopatadine (PATADAY) 0.1 % ophthalmic solution Place 1 drop into both eyes daily.   ondansetron  (ZOFRAN ) 4 MG tablet Take 4 mg by mouth every 6 (six) hours as needed.   polyethylene glycol (MIRALAX / GLYCOLAX) 17 g packet Take 17 g by mouth as needed. Daily and prn   Propylene Glycol 0.6 % SOLN Apply 1 drop to eye 3 (three) times daily.   rosuvastatin  (CRESTOR ) 5 MG tablet Take 1 tablet (5 mg total) by mouth once a week.   brimonidine  (ALPHAGAN ) 0.15 % ophthalmic solution 1 drop 3 (three) times daily. (Patient not taking: Reported on 11/28/2024)   triamcinolone  cream (KENALOG ) 0.1 % Apply 1 Application topically 2 (two) times daily. For 7 days & every 12 hours as needed for 1 month (Patient not taking: Reported on 11/28/2024)   No facility-administered encounter medications on file as of 11/28/2024.    Review of Systems:  Review of Systems  Health Maintenance  Topic Date Due   Medicare Annual Wellness (AWV)  08/10/2024   COVID-19 Vaccine (8 - Moderna risk 2025-26 season) 02/25/2025   DTaP/Tdap/Td (3 - Td or Tdap) 05/21/2030   Pneumococcal Vaccine: 50+ Years  Completed   Influenza Vaccine  Completed   Bone Density Scan  Completed   Zoster Vaccines- Shingrix  Completed   Meningococcal B Vaccine  Aged Out    Physical Exam: Vitals:   11/28/24 1347  BP: 120/80  Pulse: (!) 57  Temp: (!) 97.3 F (36.3 C)  SpO2: 97%  Height: 5' 4 (1.626 m)   Body mass index is 30.38 kg/m. Physical Exam  Labs reviewed: Basic Metabolic Panel: Recent Labs     05/31/24 0000 11/22/24 0000  NA 139 144  K 4.2 4.2  CL 103 104  CO2 23* 25*  BUN 16 17  CREATININE 0.7 0.8  CALCIUM  9.4 9.1  TSH 2.98  --    Liver Function Tests: Recent Labs    05/31/24 0000 11/22/24 0000  AST 29 32  ALT 20 29  ALKPHOS 101 94  ALBUMIN 4.0 3.8   No results for input(s): LIPASE, AMYLASE in the last 8760 hours. No results for input(s): AMMONIA in the last 8760 hours. CBC: Recent Labs    05/31/24 0000  WBC 5.0  HGB 12.9  HCT 39  PLT 178   Lipid Panel: Recent Labs    01/22/24 0000 05/31/24 0000 11/22/24 0000  CHOL 196 242* 173  HDL 44 51 46  LDLCALC 129 836 104  TRIG 114 139 113   No results found for: HGBA1C  Procedures since last visit: No results found.  Assessment/Plan Assessment and Plan Assessment & Plan Low back pain Intermittent low back pain managed with Tylenol . - Continue Tylenol  as needed.  Constipation Managed with diet, Citrucel, and occasional prune juice and Miralax. - Continue dietary management and Citrucel. - Use prune juice and Miralax as needed.  Age-related memory loss Mild memory loss, likely age-related, with no significant progression. - Encouraged socialization and mental stimulation.  Vitamin D  deficiency Low vitamin D  level at 24.1 ng/mL. Discussed importance of vitamin D . - Resume vitamin D3 5000 units daily. - Recheck vitamin D  level before next appointment.  Hyperlipidemia Cholesterol levels well-controlled with rosuvastatin . LDL 104 mg/dL, total cholesterol 826 mg/dL. - Continue rosuvastatin  weekly.      Labs/tests ordered:  * No order type specified * Next appt:  Visit date not found   Total time **:  time greater than 50% of total time spent doing pt counseling and coordination of care         [1]  Allergies Allergen Reactions   Cefuroxime    Clindamycin/Lincomycin    Levofloxacin    Lisinopril    Prochlorperazine Edisylate    Psyllium    Sulfa Antibiotics

## 2024-12-01 ENCOUNTER — Encounter: Payer: Self-pay | Admitting: Adult Health

## 2024-12-19 ENCOUNTER — Encounter

## 2024-12-20 ENCOUNTER — Telehealth: Payer: Self-pay | Admitting: Internal Medicine

## 2024-12-20 NOTE — Telephone Encounter (Signed)
 [facility] with [facility] is calling to have the office notes from 5/28 faxed to them at [phone]

## 2024-12-21 ENCOUNTER — Encounter (INDEPENDENT_AMBULATORY_CARE_PROVIDER_SITE_OTHER): Payer: Self-pay

## 2024-12-21 DIAGNOSIS — Z719 Counseling, unspecified: Secondary | ICD-10-CM

## 2024-12-21 NOTE — Telephone Encounter (Signed)
 Office notes faxed as requested. Conformation page received that it was sent successful.

## 2025-01-17 ENCOUNTER — Telehealth: Payer: Self-pay

## 2025-01-19 ENCOUNTER — Non-Acute Institutional Stay: Payer: Self-pay | Admitting: Adult Health

## 2025-01-19 ENCOUNTER — Encounter: Payer: Self-pay | Admitting: Adult Health

## 2025-01-19 DIAGNOSIS — M545 Low back pain, unspecified: Secondary | ICD-10-CM

## 2025-01-19 DIAGNOSIS — G8929 Other chronic pain: Secondary | ICD-10-CM

## 2025-01-19 MED ORDER — PREDNISONE 20 MG PO TABS: 20.0000 mg | ORAL_TABLET | Freq: Two times a day (BID) | ORAL | Status: AC

## 2025-01-19 NOTE — Telephone Encounter (Signed)
 Linda [name] Discussed with her

## 2025-01-19 NOTE — Progress Notes (Signed)
 " Location:  [facility] Service:  ALF (13) Provider:   Bari America, ANP [facility] [phone]   Charlanne Fredia CROME, MD  Patient Care Team: Charlanne Fredia CROME, MD as PCP - General (Internal Medicine) Cary Doffing, MD as Consulting Physician (Dermatology) Barbarann Oneil BROCKS, MD (Inactive) as Consulting Physician (Orthopedic Surgery) Waylan Cain, MD as Consulting Physician (Ophthalmology) Teressa Toribio SQUIBB, MD (Inactive) as Attending Physician (Gastroenterology) Magdalen Pasco RAMAN, DPM as Consulting Physician (Podiatry) Carolee Sherwood JONETTA DOUGLAS, MD as Consulting Physician (Urology) Arlana Arnt, MD as Consulting Physician (Otolaryngology)  Extended Emergency Contact Information Primary Emergency Contact: Bea Gladis SQUIBB Address: [address]          [place], [place] [address] [place] Mobile Phone: [phone] Relation: Son Secondary Emergency Contact: Bea Toribio PARAS Address: [address]          [place], [place] [address] [place] Mobile Phone: [phone] Relation: Son  Code Status:  Full Goals of care: Advanced Directive information    01/25/2024    2:09 PM  Advanced Directives  Does Patient Have a Medical Advance Directive? Yes  Type of Estate Agent of [place];Living will  Does patient want to make changes to medical advance directive? No - Patient declined  Copy of Healthcare Power of Attorney in Chart? Yes - validated most recent copy scanned in chart (See row information)     Chief Complaint  Patient presents with   Acute Visit    Low back pain    HPI:   The patient is a 89 year old who presents with worsening low back pain.  Low back pain - Onset in mid-December with progressive worsening - Pain localized to the low back - No radiation to the legs - No associated numbness, tingling, or bowel or bladder changes - No recent fall or injury - Remains ambulatory,  uses walker or power wheelchair in hallways, walks independently in apartment  Pain management - Takes Tylenol  for pain without relief - History of epidural back injections in 2021 for back pain with hx of L1compression fracture-related low back pain  Imaging findings 01/17/25 - lumbar and sacral imaging showed severe L1 compression fracture and spondylolisthesis at L4-L5  Past Medical History:  Diagnosis Date   Allergy    Anxiety    Arthritis    Bursitis    right leg   Family history of kidney cancer    Family history of lung cancer    Family history of uterine cancer    GERD (gastroesophageal reflux disease)    History of blood transfusion    History of uterine cancer 01/19/2019   Hyperlipidemia    Hypertension    Insomnia    Non Hodgkin's lymphoma (HCC) 2017-2019   cured   Osteopenia    MILD   Urine retention    Uterine cancer (HCC)    at age 37   Past Surgical History:  Procedure Laterality Date   ABDOMINAL HYSTERECTOMY     AGE 89   APPENDECTOMY     AGE 8   BLADDER SURGERY     Catarct Surgery     ECTOPIC PREGNANCY SURGERY     age 56   LAPAROSCOPIC SMALL BOWEL RESECTION     NM PET DX LYMPHOMA  10/2018   IN REMISSION   occular pressure     OTHER SURGICAL HISTORY     REPLACEMENT TOTAL KNEE BILATERAL     TONSILLECTOMY  AND ADENOIDECTOMY     age 89 or 83   UTERINE CANCER SURGERY     age 28- located in vagina    Allergies[1]  Outpatient Encounter Medications as of 01/19/2025  Medication Sig   predniSONE  (DELTASONE ) 20 MG tablet Take 1 tablet (20 mg total) by mouth 2 (two) times daily with a meal for 5 days.   acetaminophen  (TYLENOL ) 500 MG tablet Take 500 mg by mouth every 6 (six) hours as needed.   acetaminophen  (TYLENOL ) 500 MG tablet Take 500 mg by mouth 2 (two) times daily.   bimatoprost (LUMIGAN) 0.01 % SOLN Place 1 drop into both eyes at bedtime.   brimonidine -timolol (COMBIGAN) 0.2-0.5 % ophthalmic solution Place 1 drop into both eyes every 12 (twelve)  hours.   Carboxymethylcellulose Sod PF (REFRESH CELLUVISC) 1 % GEL Apply 1 drop to eye at bedtime as needed.   cetirizine (ZYRTEC) 10 MG tablet Take 10 mg by mouth daily.   Cholecalciferol (VITAMIN D3) 125 MCG (5000 UT) CAPS Take 1 capsule (5,000 Units total) by mouth daily.   cycloSPORINE  (RESTASIS ) 0.05 % ophthalmic emulsion Place 1 drop into both eyes 2 (two) times daily.   eszopiclone  (LUNESTA ) 2 MG TABS tablet Take 1 tablet (2 mg total) by mouth at bedtime. Take immediately before bedtime   guaiFENesin -dextromethorphan (ROBITUSSIN DM) 100-10 MG/5ML syrup Take 10 mLs by mouth every 6 (six) hours as needed for cough.   hydrALAZINE (APRESOLINE) 10 MG tablet Take 10 mg by mouth. Give 10 mg by mouth as needed for for any systolic BP over 819 MANUALLY recheck 1hr after dose report B/P if continued High B/P   loperamide (IMODIUM) 1 MG/5ML solution Take 2 mg by mouth as needed for diarrhea or loose stools.   Methylcellulose, Laxative, (CITRUCEL PO) Take 1 Dose by mouth in the morning and at bedtime.   nitrofurantoin  (MACRODANTIN ) 50 MG capsule Take 1 capsule (50 mg total) by mouth daily.   olopatadine (PATADAY) 0.1 % ophthalmic solution Place 1 drop into both eyes daily.   ondansetron  (ZOFRAN ) 4 MG tablet Take 4 mg by mouth every 6 (six) hours as needed.   polyethylene glycol (MIRALAX / GLYCOLAX) 17 g packet Take 17 g by mouth as needed. Daily and prn   Propylene Glycol 0.6 % SOLN Apply 1 drop to eye 3 (three) times daily.   rosuvastatin  (CRESTOR ) 5 MG tablet Take 1 tablet (5 mg total) by mouth once a week.   No facility-administered encounter medications on file as of 01/19/2025.    Review of Systems  Constitutional:  Negative for activity change, appetite change, chills, diaphoresis, fatigue and fever.  HENT:  Negative for congestion.   Respiratory:  Negative for cough, shortness of breath and wheezing.   Cardiovascular:  Negative for chest pain and leg swelling.  Gastrointestinal:  Negative  for abdominal distention, abdominal pain, constipation, diarrhea, nausea and vomiting.  Genitourinary:  Negative for difficulty urinating, dysuria and urgency.  Musculoskeletal:  Positive for back pain. Negative for gait problem, myalgias and neck pain.  Skin:  Negative for rash.  Neurological:  Negative for dizziness and weakness.  Psychiatric/Behavioral:  Negative for confusion.     Immunization History  Administered Date(s) Administered   Fluad Quad(high Dose 65+) 09/19/2022   Fluzone Influenza virus vaccine,trivalent (IIV3), split virus 08/29/2009, 09/02/2010, 08/30/2012   INFLUENZA, HIGH DOSE SEASONAL PF 09/26/2014, 08/17/2015, 08/19/2016, 08/20/2017, 08/24/2018, 09/23/2019, 10/05/2020   Influenza-Unspecified 08/20/2017, 10/05/2020, 09/18/2021, 08/14/2023   Moderna Covid-19 Fall Seasonal Vaccine 78yrs & older 08/23/2024  Moderna SARS-COV2 Booster Vaccination 05/19/2021, 03/06/2023   Moderna Sars-Covid-2 Vaccination 12/27/2019, 01/24/2020, 10/25/2020, 09/25/2021, 04/30/2022, 11/05/2022   Pneumococcal Conjugate-13 09/07/2014   Pneumococcal Polysaccharide-23 09/21/2007   Tdap 05/23/2010, 05/21/2020   Unspecified SARS-COV-2 Vaccination 08/28/2024   Zoster Recombinant(Shingrix) 09/22/2017, 11/25/2017   Zoster, Live 10/24/2005   Pertinent  Health Maintenance Due  Topic Date Due   Influenza Vaccine  Completed   Bone Density Scan  Completed      08/11/2023   11:34 AM 10/20/2023    1:05 PM 12/30/2023   12:47 PM 01/25/2024    2:09 PM 11/28/2024    1:54 PM  Fall Risk  Falls in the past year? 0 0 0 0 0  Was there an injury with Fall? 0  0  0  0  0  Fall Risk Category Calculator 0 0 0 0 0  Patient at Risk for Falls Due to Impaired balance/gait  History of fall(s);Impaired balance/gait No Fall Risks Impaired balance/gait;Impaired mobility  Fall risk Follow up Falls evaluation completed;Education provided Falls evaluation completed Falls evaluation completed;Education provided Falls  evaluation completed Falls evaluation completed     Data saved with a previous flowsheet row definition   Functional Status Survey:    Vitals:   01/19/25 1227  BP: (!) 142/78  Pulse: (!) 54  Resp: 20  Temp: (!) 97.4 F (36.3 C)  SpO2: 97%   There is no height or weight on file to calculate BMI. Physical Exam Vitals and nursing note reviewed.  Constitutional:      Appearance: Normal appearance.  Musculoskeletal:        General: No swelling, tenderness, deformity or signs of injury.     Right lower leg: No edema.     Left lower leg: No edema.     Comments: Strength 5/5 BLE Able to stand on her toes while holding on to the dresser Able to walk on her heels Steady    Skin:    General: Skin is warm and dry.  Neurological:     Mental Status: She is alert. Mental status is at baseline.     Labs reviewed: Recent Labs    05/31/24 0000 11/22/24 0000  NA 139 144  K 4.2 4.2  CL 103 104  CO2 23* 25*  BUN 16 17  CREATININE 0.7 0.8  CALCIUM  9.4 9.1   Recent Labs    05/31/24 0000 11/22/24 0000  AST 29 32  ALT 20 29  ALKPHOS 101 94  ALBUMIN 4.0 3.8   Recent Labs    05/31/24 0000  WBC 5.0  HGB 12.9  HCT 39  PLT 178   Lab Results  Component Value Date   TSH 2.98 05/31/2024   No results found for: HGBA1C Lab Results  Component Value Date   CHOL 173 11/22/2024   HDL 46 11/22/2024   LDLCALC 104 11/22/2024   TRIG 113 11/22/2024   CHOLHDL 3 08/24/2018    Significant Diagnostic Results in last 30 days:  No results found.  Assessment/Plan  Lumbar compression fracture and spondylolisthesis with chronic low back pain Hx of L1 compression fracture and arthritic changes, resolved several years ago but have now returned.  No change to xray  Current Tylenol  use ineffective. - Prescribed prednisone  20 mg BID for 5 days with food. - Advised follow-up if no improvement. - Consider referral to Dr. Barbarann if no improvement.     [1]  Allergies Allergen  Reactions   Cefuroxime    Clindamycin/Lincomycin    Levofloxacin  Lisinopril    Prochlorperazine Edisylate    Psyllium    Sulfa Antibiotics    "

## 2025-01-24 ENCOUNTER — Encounter: Admitting: Internal Medicine

## 2025-02-01 ENCOUNTER — Encounter

## 2025-02-28 ENCOUNTER — Encounter: Admitting: Internal Medicine
